# Patient Record
Sex: Female | Born: 1970 | Race: Black or African American | Hispanic: No | Smoking: Never smoker
Health system: Southern US, Community
[De-identification: ages and names within clinical notes are randomized; demographics above are authoritative.]

## PROBLEM LIST (undated history)

## (undated) DIAGNOSIS — G510 Bell's palsy: Secondary | ICD-10-CM

## (undated) DIAGNOSIS — R51 Headache: Secondary | ICD-10-CM

## (undated) DIAGNOSIS — O24419 Gestational diabetes mellitus in pregnancy, unspecified control: Secondary | ICD-10-CM

## (undated) DIAGNOSIS — Z87898 Personal history of other specified conditions: Secondary | ICD-10-CM

## (undated) DIAGNOSIS — K8020 Calculus of gallbladder without cholecystitis without obstruction: Secondary | ICD-10-CM

## (undated) DIAGNOSIS — E119 Type 2 diabetes mellitus without complications: Secondary | ICD-10-CM

## (undated) DIAGNOSIS — R519 Headache, unspecified: Secondary | ICD-10-CM

## (undated) DIAGNOSIS — I480 Paroxysmal atrial fibrillation: Secondary | ICD-10-CM

## (undated) DIAGNOSIS — I10 Essential (primary) hypertension: Secondary | ICD-10-CM

## (undated) DIAGNOSIS — K56609 Unspecified intestinal obstruction, unspecified as to partial versus complete obstruction: Secondary | ICD-10-CM

## (undated) HISTORY — PX: HERNIA REPAIR: SHX51

## (undated) HISTORY — DX: Unspecified intestinal obstruction, unspecified as to partial versus complete obstruction: K56.609

---

## 2002-07-21 ENCOUNTER — Other Ambulatory Visit: Admission: RE | Admit: 2002-07-21 | Discharge: 2002-07-21 | Payer: Self-pay | Admitting: Family Medicine

## 2002-07-21 ENCOUNTER — Other Ambulatory Visit: Admission: RE | Admit: 2002-07-21 | Discharge: 2002-07-21 | Payer: Self-pay | Admitting: Anesthesiology

## 2002-11-24 ENCOUNTER — Ambulatory Visit (HOSPITAL_COMMUNITY): Admission: RE | Admit: 2002-11-24 | Discharge: 2002-11-24 | Payer: Self-pay | Admitting: *Deleted

## 2002-11-28 ENCOUNTER — Inpatient Hospital Stay (HOSPITAL_COMMUNITY): Admission: AD | Admit: 2002-11-28 | Discharge: 2002-11-28 | Payer: Self-pay | Admitting: *Deleted

## 2002-12-11 ENCOUNTER — Ambulatory Visit (HOSPITAL_COMMUNITY): Admission: RE | Admit: 2002-12-11 | Discharge: 2002-12-11 | Payer: Self-pay | Admitting: *Deleted

## 2002-12-27 ENCOUNTER — Encounter: Admission: RE | Admit: 2002-12-27 | Discharge: 2002-12-27 | Payer: Self-pay | Admitting: *Deleted

## 2003-01-24 ENCOUNTER — Encounter: Admission: RE | Admit: 2003-01-24 | Discharge: 2003-01-24 | Payer: Self-pay | Admitting: *Deleted

## 2003-02-07 ENCOUNTER — Encounter: Admission: RE | Admit: 2003-02-07 | Discharge: 2003-02-07 | Payer: Self-pay | Admitting: *Deleted

## 2003-02-07 ENCOUNTER — Ambulatory Visit (HOSPITAL_COMMUNITY): Admission: RE | Admit: 2003-02-07 | Discharge: 2003-02-07 | Payer: Self-pay | Admitting: *Deleted

## 2003-02-20 ENCOUNTER — Encounter: Admission: RE | Admit: 2003-02-20 | Discharge: 2003-05-21 | Payer: Self-pay | Admitting: *Deleted

## 2003-02-21 ENCOUNTER — Encounter: Admission: RE | Admit: 2003-02-21 | Discharge: 2003-02-21 | Payer: Self-pay | Admitting: *Deleted

## 2003-02-28 ENCOUNTER — Ambulatory Visit (HOSPITAL_COMMUNITY): Admission: RE | Admit: 2003-02-28 | Discharge: 2003-02-28 | Payer: Self-pay | Admitting: *Deleted

## 2003-02-28 ENCOUNTER — Encounter: Admission: RE | Admit: 2003-02-28 | Discharge: 2003-02-28 | Payer: Self-pay | Admitting: *Deleted

## 2003-03-07 ENCOUNTER — Encounter: Admission: RE | Admit: 2003-03-07 | Discharge: 2003-03-07 | Payer: Self-pay | Admitting: *Deleted

## 2003-03-14 ENCOUNTER — Encounter: Admission: RE | Admit: 2003-03-14 | Discharge: 2003-03-14 | Payer: Self-pay | Admitting: *Deleted

## 2003-03-21 ENCOUNTER — Encounter: Admission: RE | Admit: 2003-03-21 | Discharge: 2003-03-21 | Payer: Self-pay | Admitting: Obstetrics & Gynecology

## 2003-03-26 ENCOUNTER — Encounter: Admission: RE | Admit: 2003-03-26 | Discharge: 2003-03-26 | Payer: Self-pay | Admitting: *Deleted

## 2003-03-28 ENCOUNTER — Encounter: Admission: RE | Admit: 2003-03-28 | Discharge: 2003-03-28 | Payer: Self-pay | Admitting: *Deleted

## 2003-03-28 ENCOUNTER — Ambulatory Visit (HOSPITAL_COMMUNITY): Admission: RE | Admit: 2003-03-28 | Discharge: 2003-03-28 | Payer: Self-pay | Admitting: *Deleted

## 2003-04-02 ENCOUNTER — Encounter: Admission: RE | Admit: 2003-04-02 | Discharge: 2003-04-02 | Payer: Self-pay | Admitting: *Deleted

## 2003-04-04 ENCOUNTER — Encounter: Admission: RE | Admit: 2003-04-04 | Discharge: 2003-04-04 | Payer: Self-pay | Admitting: *Deleted

## 2003-04-09 ENCOUNTER — Encounter: Admission: RE | Admit: 2003-04-09 | Discharge: 2003-04-09 | Payer: Self-pay | Admitting: *Deleted

## 2003-04-12 ENCOUNTER — Encounter: Admission: RE | Admit: 2003-04-12 | Discharge: 2003-04-12 | Payer: Self-pay | Admitting: *Deleted

## 2003-04-16 ENCOUNTER — Encounter: Admission: RE | Admit: 2003-04-16 | Discharge: 2003-04-16 | Payer: Self-pay | Admitting: *Deleted

## 2003-04-19 ENCOUNTER — Encounter: Admission: RE | Admit: 2003-04-19 | Discharge: 2003-04-19 | Payer: Self-pay | Admitting: *Deleted

## 2003-04-23 ENCOUNTER — Encounter: Admission: RE | Admit: 2003-04-23 | Discharge: 2003-04-23 | Payer: Self-pay | Admitting: *Deleted

## 2003-04-26 ENCOUNTER — Encounter: Admission: RE | Admit: 2003-04-26 | Discharge: 2003-04-26 | Payer: Self-pay | Admitting: Family Medicine

## 2003-04-26 ENCOUNTER — Ambulatory Visit (HOSPITAL_COMMUNITY): Admission: RE | Admit: 2003-04-26 | Discharge: 2003-04-26 | Payer: Self-pay | Admitting: *Deleted

## 2003-04-30 ENCOUNTER — Encounter: Admission: RE | Admit: 2003-04-30 | Discharge: 2003-04-30 | Payer: Self-pay | Admitting: *Deleted

## 2003-05-02 ENCOUNTER — Inpatient Hospital Stay (HOSPITAL_COMMUNITY): Admission: AD | Admit: 2003-05-02 | Discharge: 2003-05-02 | Payer: Self-pay | Admitting: Family Medicine

## 2003-05-03 ENCOUNTER — Encounter: Admission: RE | Admit: 2003-05-03 | Discharge: 2003-05-03 | Payer: Self-pay | Admitting: *Deleted

## 2003-05-04 ENCOUNTER — Inpatient Hospital Stay (HOSPITAL_COMMUNITY): Admission: RE | Admit: 2003-05-04 | Discharge: 2003-05-07 | Payer: Self-pay | Admitting: *Deleted

## 2003-05-04 ENCOUNTER — Encounter (INDEPENDENT_AMBULATORY_CARE_PROVIDER_SITE_OTHER): Payer: Self-pay | Admitting: Specialist

## 2003-05-10 ENCOUNTER — Inpatient Hospital Stay (HOSPITAL_COMMUNITY): Admission: AD | Admit: 2003-05-10 | Discharge: 2003-05-10 | Payer: Self-pay | Admitting: Obstetrics & Gynecology

## 2004-02-26 ENCOUNTER — Inpatient Hospital Stay (HOSPITAL_COMMUNITY): Admission: AD | Admit: 2004-02-26 | Discharge: 2004-02-26 | Payer: Self-pay | Admitting: *Deleted

## 2004-04-29 ENCOUNTER — Inpatient Hospital Stay (HOSPITAL_COMMUNITY): Admission: AD | Admit: 2004-04-29 | Discharge: 2004-04-29 | Payer: Self-pay | Admitting: Family Medicine

## 2004-04-29 ENCOUNTER — Ambulatory Visit: Payer: Self-pay | Admitting: Family Medicine

## 2004-04-30 ENCOUNTER — Ambulatory Visit: Payer: Self-pay | Admitting: *Deleted

## 2004-05-07 ENCOUNTER — Ambulatory Visit: Payer: Self-pay | Admitting: *Deleted

## 2004-05-14 ENCOUNTER — Ambulatory Visit: Payer: Self-pay | Admitting: *Deleted

## 2004-05-21 ENCOUNTER — Ambulatory Visit: Payer: Self-pay | Admitting: *Deleted

## 2004-05-28 ENCOUNTER — Ambulatory Visit: Payer: Self-pay | Admitting: Family Medicine

## 2004-06-04 ENCOUNTER — Ambulatory Visit: Payer: Self-pay | Admitting: Obstetrics & Gynecology

## 2004-06-05 ENCOUNTER — Ambulatory Visit (HOSPITAL_COMMUNITY): Admission: RE | Admit: 2004-06-05 | Discharge: 2004-06-05 | Payer: Self-pay | Admitting: *Deleted

## 2004-06-10 ENCOUNTER — Ambulatory Visit: Payer: Self-pay | Admitting: Obstetrics and Gynecology

## 2004-06-10 ENCOUNTER — Inpatient Hospital Stay (HOSPITAL_COMMUNITY): Admission: AD | Admit: 2004-06-10 | Discharge: 2004-06-12 | Payer: Self-pay | Admitting: *Deleted

## 2004-06-18 ENCOUNTER — Ambulatory Visit: Payer: Self-pay | Admitting: Obstetrics & Gynecology

## 2004-06-20 ENCOUNTER — Ambulatory Visit: Payer: Self-pay | Admitting: *Deleted

## 2004-06-26 ENCOUNTER — Ambulatory Visit: Payer: Self-pay | Admitting: Family Medicine

## 2004-06-30 ENCOUNTER — Ambulatory Visit: Payer: Self-pay | Admitting: Obstetrics & Gynecology

## 2004-07-02 ENCOUNTER — Ambulatory Visit: Payer: Self-pay | Admitting: *Deleted

## 2004-07-02 ENCOUNTER — Ambulatory Visit (HOSPITAL_COMMUNITY): Admission: RE | Admit: 2004-07-02 | Discharge: 2004-07-02 | Payer: Self-pay | Admitting: *Deleted

## 2004-07-07 ENCOUNTER — Ambulatory Visit: Payer: Self-pay | Admitting: Obstetrics & Gynecology

## 2004-07-09 ENCOUNTER — Ambulatory Visit: Payer: Self-pay | Admitting: Obstetrics and Gynecology

## 2004-07-14 ENCOUNTER — Ambulatory Visit: Payer: Self-pay | Admitting: Obstetrics and Gynecology

## 2004-07-16 ENCOUNTER — Ambulatory Visit: Payer: Self-pay | Admitting: Obstetrics & Gynecology

## 2004-07-17 ENCOUNTER — Ambulatory Visit: Payer: Self-pay | Admitting: Obstetrics & Gynecology

## 2004-07-17 ENCOUNTER — Inpatient Hospital Stay (HOSPITAL_COMMUNITY): Admission: RE | Admit: 2004-07-17 | Discharge: 2004-07-20 | Payer: Self-pay | Admitting: Obstetrics & Gynecology

## 2004-07-17 ENCOUNTER — Encounter (INDEPENDENT_AMBULATORY_CARE_PROVIDER_SITE_OTHER): Payer: Self-pay | Admitting: Specialist

## 2004-07-23 ENCOUNTER — Ambulatory Visit: Payer: Self-pay | Admitting: Obstetrics and Gynecology

## 2004-07-23 ENCOUNTER — Inpatient Hospital Stay (HOSPITAL_COMMUNITY): Admission: AD | Admit: 2004-07-23 | Discharge: 2004-07-23 | Payer: Self-pay | Admitting: Obstetrics and Gynecology

## 2004-07-25 ENCOUNTER — Ambulatory Visit: Payer: Self-pay | Admitting: Obstetrics and Gynecology

## 2004-07-25 ENCOUNTER — Inpatient Hospital Stay (HOSPITAL_COMMUNITY): Admission: AD | Admit: 2004-07-25 | Discharge: 2004-07-25 | Payer: Self-pay | Admitting: Obstetrics and Gynecology

## 2004-08-27 ENCOUNTER — Inpatient Hospital Stay (HOSPITAL_COMMUNITY): Admission: AD | Admit: 2004-08-27 | Discharge: 2004-08-27 | Payer: Self-pay | Admitting: Obstetrics & Gynecology

## 2005-02-05 ENCOUNTER — Ambulatory Visit (HOSPITAL_COMMUNITY): Admission: RE | Admit: 2005-02-05 | Discharge: 2005-02-05 | Payer: Self-pay | Admitting: General Surgery

## 2005-02-05 HISTORY — PX: VENTRAL HERNIA REPAIR: SHX424

## 2007-06-30 ENCOUNTER — Ambulatory Visit (HOSPITAL_COMMUNITY): Admission: RE | Admit: 2007-06-30 | Discharge: 2007-06-30 | Payer: Self-pay

## 2007-11-08 ENCOUNTER — Emergency Department (HOSPITAL_COMMUNITY): Admission: EM | Admit: 2007-11-08 | Discharge: 2007-11-08 | Payer: Self-pay | Admitting: Emergency Medicine

## 2008-06-07 ENCOUNTER — Ambulatory Visit: Payer: Self-pay | Admitting: Obstetrics and Gynecology

## 2008-06-08 ENCOUNTER — Encounter: Payer: Self-pay | Admitting: Obstetrics and Gynecology

## 2008-06-08 LAB — CONVERTED CEMR LAB
FSH: 38.4 milliintl units/mL
LH: 24.4 milliintl units/mL
Prolactin: 2 ng/mL
TSH: 1.179 microintl units/mL (ref 0.350–4.500)
hCG, Beta Chain, Quant, S: <2 milliintl units/mL

## 2008-06-14 ENCOUNTER — Ambulatory Visit (HOSPITAL_COMMUNITY): Admission: RE | Admit: 2008-06-14 | Discharge: 2008-06-14 | Payer: Self-pay | Admitting: Obstetrics & Gynecology

## 2009-02-28 ENCOUNTER — Emergency Department (HOSPITAL_COMMUNITY): Admission: EM | Admit: 2009-02-28 | Discharge: 2009-02-28 | Payer: Self-pay | Admitting: Emergency Medicine

## 2009-11-14 ENCOUNTER — Inpatient Hospital Stay (HOSPITAL_COMMUNITY)
Admission: AD | Admit: 2009-11-14 | Discharge: 2009-11-14 | Payer: Self-pay | Source: Home / Self Care | Admitting: Obstetrics and Gynecology

## 2010-01-05 DIAGNOSIS — I480 Paroxysmal atrial fibrillation: Secondary | ICD-10-CM

## 2010-01-05 HISTORY — DX: Paroxysmal atrial fibrillation: I48.0

## 2010-01-26 ENCOUNTER — Encounter: Payer: Self-pay | Admitting: *Deleted

## 2010-03-18 LAB — WET PREP, GENITAL: Yeast Wet Prep HPF POC: NONE SEEN

## 2010-03-26 LAB — DIFFERENTIAL
Basophils Absolute: 0.1 10*3/uL (ref 0.0–0.1)
Basophils Relative: 0 % (ref 0–1)
Eosinophils Absolute: 0.1 10*3/uL (ref 0.0–0.7)
Eosinophils Relative: 1 % (ref 0–5)
Monocytes Absolute: 0.7 10*3/uL (ref 0.1–1.0)
Neutro Abs: 9 10*3/uL — ABNORMAL HIGH (ref 1.7–7.7)

## 2010-03-26 LAB — URINE MICROSCOPIC-ADD ON

## 2010-03-26 LAB — URINALYSIS, ROUTINE W REFLEX MICROSCOPIC
Ketones, ur: NEGATIVE mg/dL
Nitrite: NEGATIVE
Specific Gravity, Urine: 1.022 (ref 1.005–1.030)
pH: 8 (ref 5.0–8.0)

## 2010-03-26 LAB — CBC
HCT: 39.9 % (ref 36.0–46.0)
Hemoglobin: 13 g/dL (ref 12.0–15.0)
MCHC: 32.5 g/dL (ref 30.0–36.0)
MCV: 79.8 fL (ref 78.0–100.0)
RDW: 15.6 % — ABNORMAL HIGH (ref 11.5–15.5)

## 2010-03-26 LAB — BASIC METABOLIC PANEL
CO2: 30 mEq/L (ref 19–32)
Calcium: 8.9 mg/dL (ref 8.4–10.5)
Glucose, Bld: 169 mg/dL — ABNORMAL HIGH (ref 70–99)
Sodium: 135 mEq/L (ref 135–145)

## 2010-03-26 LAB — PREGNANCY, URINE: Preg Test, Ur: NEGATIVE

## 2010-03-26 LAB — HEPATIC FUNCTION PANEL
Bilirubin, Direct: 0.2 mg/dL (ref 0.0–0.3)
Indirect Bilirubin: 0.2 mg/dL — ABNORMAL LOW (ref 0.3–0.9)

## 2010-05-20 NOTE — Group Therapy Note (Signed)
NAME:  [name], [name] NO.:  [id number]   MEDICAL RECORD NO.:  [id number]          PATIENT TYPE:  WOC   LOCATION:  [facility]                   FACILITY:  [facility]   PHYSICIAN:  Argentina Donovan, MD        DATE OF BIRTH:  1970-12-17   DATE OF SERVICE:  06/07/2008                                  CLINIC NOTE   The patient is a 40 year old African female from [place], gravida 4,  para 4-0-0-4 who has 2 of her children here, youngest 38 years old.  Her  other 2 are in [place] still.  She had her last Pap smear just recently  [facility] and they referred her here because of  difficulty seeing the cervix evaluating for the IUD that she has had in  since shortly after the birth of her last baby and they thought she felt  a pelvic mass.  Interestingly enough, this patient has been amenorrheic  since the IUD was placed and she has a Tax adviser.   PHYSICAL EXAMINATION:  ABDOMEN:  On examination, the abdomen soft, flat,  nontender.  No masses, no organomegaly.  GENITALIA:  External genitalia is normal.  BUS within normal limits.  Vagina is clean and well rugated.  The cervix I could not visualize  using even the very largest speculum that we have because of redundant  tissue and the acute anterior position of the cervix.  On bimanual  pelvic examination, it was difficult even to feel the cervix.  It felt  like the uterus was enlarged with anterior fibroid posting on the  bladder or a mass behind the uterus.   In any case, I am going to follow this up with an ultrasound to just  evaluate the uterus and make sure the IUD is still in place.  In  addition to this, I want to run some lab tests to see if there is any  explanation for her amenorrhea.  The patient denies hot flashes or any  other signs of menopause or ovarian insufficiency.   DIAGNOSES:  1. Pelvic mass.  2. Long-term amenorrhea with a ParaGard intrauterine device.  3. Cervix was unable to visualize and  difficult to palpate.           ______________________________  Argentina Donovan, MD     PR/MEDQ  D:  06/07/2008  T:  06/07/2008  Job:  [id number]

## 2010-05-23 NOTE — Discharge Summary (Signed)
NAME:  [name], [name] NO.:  [id number]   MEDICAL RECORD NO.:  [id number]                   PATIENT TYPE:  INP   LOCATION:  9111                                 [facility]   PHYSICIAN:  Ursula Beath, MD               DATE OF BIRTH:  04-19-1970   DATE OF ADMISSION:  05/04/2003  DATE OF DISCHARGE:  05/07/2003                                 DISCHARGE SUMMARY   DISCHARGE DIAGNOSES:  1. Term pregnancy, delivered.  2. Low transverse cesarean section.   DISCHARGE MEDICATIONS:  1. Ibuprofen 600 mg one p.o. q.6h. p.r.n. pain.  2. Percocet 325 one p.o. q.4-6h. p.r.n. severe pain.  3. Micronor one p.o. daily.  4. Colace two tablets p.o. b.i.d. p.r.n. constipation.  5. Prenatal vitamins one p.o. daily.  6. Iron sulfate 325 mg one p.o. t.i.d.   HOSPITAL COURSE:  This is a 40 year old G6, P4-0-1-2 who was admitted to the  hospital for a low transverse cesarean section for a large for gestational  age fetus and a history of prior  neonatal death secondary to birth trauma.  The patient underwent a low transverse cesarean section without  complications.  She did, however, develop a postoperative fever and was give  triple antibiotic therapy with ampicillin, gentamicin and clindamycin.  She  became afebrile and was discharged with no antibiotics.  Estimated blood  loss from her cesarean section was 500 to 1000 mL.  The patient also had a  gestation complicated by diabetes mellitus.  She delivered a female infant  with Apgars of 8 at one minute and 9 at five minutes.  The baby's weight was  10 pounds 2 ounces, length was 22 inches.   LABORATORY DATA AT DISCHARGE:  Hemoglobin 6.6, hematocrit 21.3.   The patient was discharged to home in improved condition and will follow up  in three days at the [facility]U to have her staples removed and will also follow up  at the [facility] in six weeks.                                               Ursula Beath, MD    JT/MEDQ  D:  05/07/2003  T:  05/08/2003  Job:  [id number]

## 2010-05-23 NOTE — Op Note (Signed)
[name], [name]:  [id number]   MEDICAL RECORD NO.:  [id number]          PATIENT TYPE:  WOC   LOCATION:  WOC                          FACILITY:  [facility]   PHYSICIAN:  Lesly Dukes, M.D. DATE OF BIRTH:  10/13/1970   DATE OF PROCEDURE:  07/17/2004  DATE OF DISCHARGE:                                 OPERATIVE REPORT   PREOPERATIVE DIAGNOSIS:  A 40 year old G7, para 5-0-0-1-5, at 82 weeks  estimated gestational age for repeat cesarean section, gestational diabetes  and presumed macrosomia.   POSTOPERATIVE DIAGNOSIS:  A 40 year old G7, para 5-0-0-1-5, at 59 weeks  estimated gestational age for repeat cesarean section, gestational diabetes  and presumed macrosomia.   OPERATION/PROCEDURE:  Repeat low flap transverse cesarean section.   SURGEON:  Lesly Dukes, M.D.   ASSISTANT:  Marc Morgans. Mayford Knife, M.D.   ANESTHESIA:  Spinal.   SPECIMENS:  Placenta.   ESTIMATED BLOOD LOSS:  900 mL.   COMPLICATIONS:  None.   FINDINGS:  A viable infant, weighing 11 pounds 2 ounces, vertex, clear  fluid.  Nuchal cord x1.  Normal uterus, ovaries and fallopian tubes.  Apgars  9 at one minute and 9 at five minutes.   DESCRIPTION OF PROCEDURE:  After informed consent was obtained, the patient  was taken to the operating room where spinal anesthesia was found to be  adequate.  The patient was placed in the dorsal lithotomy position with a  leftward tilt and prepared and draped in the normal sterile fashion.  The  Foley was in the bladder.   A Pfannenstiel skin incision was made with the scalpel and carried down to  the underlying fascia.  The fascia was incised in the midline and this  incision was extended bilaterally with the Mayo scissors.  The superior and  inferior aspects of the fascial incision were grasped with Kocher clamps,  tented up, dissected off sharply and bluntly from underlying layers of  rectus muscles.  The rectus muscles were separated in the  midline.  The  peritoneum was identified, tented up and entered sharply with the Metzenbaum  scissors. This incision was extended both superior and inferiorly with good  visualization of the bladder.  The bladder blade was inserted.  The  vesicouterine peritoneum was identified, tented up and entered sharply with  the Metzenbaum scissors.  This incision was extended bilaterally and the  bladder flap was created digitally.  Uterine incision was made in the  transverse fashion in the lower uterine segments.  This incision was  extended bilaterally with the bandage scissors.  The baby's head was  delivered atraumatically.  The nose and mouth were suctioned.  The nuchal  cord was reduced. The rest of the baby's body delivered easily.  The cord  was clamped and cut and the baby was handed off to the waiting  pediatricians.  Cord blood was sent for type and screen and a umbilical cord  arterial blood gas was also sent.  Placenta delivered spontaneously.  The  uterus was cleared of all clots and debris.   The uterine incision was closed in  multiple layers, the first layer with  Vicryl in a running locked fashion.  A second suture of 0 Monocryl.  Good  hemostasis was noted.  The intraperitoneal cavity was copiously irrigated  and the uterus was noted to be hemostatic on tension.  The rectus muscles  and peritoneum were  hemostatic.  The fascia was closed with 0 Vicryl in a  running fashion.  Good hemostasis was noted.  The subcutaneous tissue was  irrigated and found to be hemostatic.  The skin was closed with staples.  The patient tolerated the procedure well.  Sponge, lab, instrument, and  needle counts were x2.  The patient went to the recovery room in stable  condition.       KHL/MEDQ  D:  07/17/2004  T:  07/17/2004  Job:  [id number]

## 2010-05-23 NOTE — Op Note (Signed)
NAMETKEYAH, BURKMAN                        ACCOUNT NO.:  [id number]   MEDICAL RECORD NO.:  [id number]                   PATIENT TYPE:  INP   LOCATION:  9111                                 [facility]   PHYSICIAN:  Conni Elliot, M.D.             DATE OF BIRTH:  06-26-1970   DATE OF PROCEDURE:  05/04/2003  DATE OF DISCHARGE:  05/07/2003                                 OPERATIVE REPORT   PREOPERATIVE DIAGNOSES:  1. History of neonatal demise.  2. Large for gestational age infant.   POSTOPERATIVE DIAGNOSES:  1. History of neonatal demise.  2. Large for gestational age infant.   OPERATION:  Low transverse cesarean section.   FINDINGS:  Female infant with Apgars of 8 and 9.  Cord pH 7.23.  Placenta  was sent to pathology.   DESCRIPTION OF PROCEDURE:  After placing the patient under regional  anesthetic, the patient in supine with left lateral tilt position, abdomen  is prepped and draped in sterile fashion.  A Pfannenstiel incision is made.  Incision made in skin to fascia.  Rectus muscles separated.  Peritoneum  entered.  Bladder flap created.  Low transverse uterine incision is made.  Baby is delivered in vertex presentation.  Cord doubly clamped and cut.  Placenta delivered spontaneously.  Uterus closed in routine fashion.  Anterior peritoneal fascia and subcutaneous skin closed in routine fashion.  Estimated blood loss approximately 800 to 1000 mL.  Sponge, needle and  instrument counts were correct.                                               Conni Elliot, M.D.    ASG/MEDQ  D:  07/11/2003  T:  07/11/2003  Job:  [id number]

## 2010-05-23 NOTE — Op Note (Signed)
[name], [name]              ACCOUNT NO.:  [id number]   MEDICAL RECORD NO.:  [id number]          PATIENT TYPE:  OIB   LOCATION:  2550                         FACILITY:  [facility]   PHYSICIAN:  Leonie Man, M.D.   DATE OF BIRTH:  1970-02-04   DATE OF PROCEDURE:  02/05/2005  DATE OF DISCHARGE:                                 OPERATIVE REPORT   PREOPERATIVE DIAGNOSIS:  Ventral hernia   POSTOPERATIVE DIAGNOSIS:  Ventral hernia.   PROCEDURE:  Repair of ventral hernia with mesh (Kugel dual mesh).   SURGEON:  Leonie Man, M.D.   ASSISTANT:  OR tech.   ANESTHESIA:  General.   SPECIMENS:  No specimens to the lab.   ESTIMATED BLOOD LOSS:  Minimal.   COMPLICATIONS:  There were no complications.   CONDITION:  The patient returned to the PACU in good condition.   HISTORY:  The patient is a 40 year old female who is status post two  cesarean sections. She now has a lower midline hernia as well as an  umbilical hernia; and significant laxity of her abdominal wall. She comes to  the operating room for repair of her hernias after the risks and potential  benefits of surgery have been fully discussed. All questions answered; and  consent and consent obtained.   PROCEDURE:  Following the induction of satisfactory general anesthesia, a  Foley catheter was placed in the bladder. The abdomen is prepped and draped  to be included in a sterile operative field. Laparotomy is created through a  midline incision deepened through skin, subcutaneous tissues, and carried  down to the hernia sac which is dissected free; and the abdomen is entered.  The edges of the sac were cleared from all omental adhesions, allowing a  clear access to the sac.  I used a 17  x 12 cm Kugel patch, inserted it into  the abdomen and deployed it to cover both the umbilical hernia and the lower  abdominal wall hernia. This was sewn in with interrupted sutures of #1  Novofil and then the additional tacked to the  abdominal wall using an  Endotacker. The repair is noted to be intact. Sponge and instrument counts  were verified. The fascia is closed over the patch with interrupted #1  Novofil sutures.  Subcutaneous tissues closed with a running 2-0 Vicryl suture; and the skin  closed with a 4-0 Monocryl suture. Wounds were then reinforced with Steri-  Strips. Sterile dressings applied. The anesthetic reversed; and the patient  removed from the operating room to the recovery room in stable condition.  She tolerated the procedure well.      Leonie Man, M.D.  Electronically Signed     PB/MEDQ  D:  02/05/2005  T:  02/05/2005  Job:  [id number]

## 2010-05-23 NOTE — Discharge Summary (Signed)
[name], [name]              ACCOUNT NO.:  [id number]   MEDICAL RECORD NO.:  [id number]          PATIENT TYPE:  INP   LOCATION:  9158                          [facility]   PHYSICIAN:  Conni Elliot, M.D.DATE OF BIRTH:  [birth year]   DATE OF ADMISSION:  06/10/2004  DATE OF DISCHARGE:  06/12/2004                                 DISCHARGE SUMMARY   ADMITTING DIAGNOSES:  1.  [age]-year-old gravida 7, para 5-0-1-3, at 33 weeks 5 days.  2.  Preterm uterine contractions.  3.  Dehydration.  4.  A-2 diabetes mellitus.  5.  Large-for-gestational-age fetus, estimated fetal weight greater than      95%.  6.  Marginal previa.   DISCHARGE DIAGNOSES:  1.  [age]-year-old gravida 7, para 5-0-1-3, at 33 weeks 6 days.  2.  A-2 diabetes mellitus.  3.  Large-for-gestational-age fetus, estimated fetal weight greater than      95%.  4.  Marginal previa, resolved per ultrasound, June 10, 2004.  5.  Preterm uterine contractions, resolved.   DISCHARGE MEDICATIONS:  The patient will be discharged with the following  medications:  1.  Procardia XL 30 mg p.o. b.i.d.  2.  Prenatal vitamins -- one tablet p.o. daily.  3.  NPH 15 units and Regular 7 units q.a.m.; Regular 5 units before dinner;      NPH 18 units nightly.   ADMISSION HISTORY:  [name] is a 40 year old G7, P5-0-1-3 female that  presented to the MAU at 33 weeks 5 days complaining of low back pain and  abdominal pain for 1 day.  The patient complained of contractions, but  denied rupture of membranes or vaginal bleeding, reported good fetal  movement.  The patient also complained of being weak and dizzy for 24 hours  without appetite, had not eaten for 24 hours.  The patient is currently  being seen at [facility] for risk factor of A-2 gestational diabetes,  marginal previa, large-for-gestational-age fetus and history of neonatal  death in [place].   At admission, vital signs were within normal limits.  On  physical exam, the  patient looked uncomfortable with uterine contractions.  Digital cervical  exam was deferred.  Fetal heart tones were in the 120s to 125 range,  moderate variability, no decelerations, with a reactive strip.  The patient  was contracting 6-8 minutes apart, lasting 60-100 seconds.   ADMISSION LABORATORIES:  CBG 88.  Specific gravity 1.025, greater than 80  ketones.  Hemoglobin 10.2, WBC 11.1, platelets 207,000, MCV 71.6.  BUN 4,  creatinine 0.6, sodium 136, potassium 3.5, chloride 99, CO2 24, glucose 76,  calcium 9.2.  Group B strep negative.   HOSPITAL ADMISSION COURSE:  The patient was admitted to the antenatal unit  for preterm uterine contractions.  IV was started to begin rehydration  secondary to dehydration as assessed by urinary analysis with greater than  80 ketones and specific gravity.  The patient  was also started on Procardia  XL 30 mg b.i.d.  By day 2 of hospital admission, the patient's uterine  contractions had decreased to 1-2 contractions per hour.  By day of  discharge, the patient had not had any contractions the night before  discharge and only 1 uterine contraction per monitor the morning of  discharge date.  The patient's vital signs remained stable, no reported  leakage of fluid, no vaginal bleeding, baby continued to move well as  reported by patient.  The day of discharge, the patient was taken off IV  fluids and monitored for return of contractions; however, the patient  continued to hydrate herself orally and no contractions were noted per  monitor.   The patient was kept on home insulin regimen throughout the hospital course.  Fasting CBGs remained at goal throughout hospital admission; however, the  patient did have some elevated postprandial CBGs with a max of 164, two  hours postprandially.   ULTRASOUNDS:  Ultrasound on June 10, 2004:  One fetus, cephalic positioning,  placenta posterior, no previa, AFI 12.6, cervix 3.5 cm.    FOLLOWUP:  The patient will follow up at [facility] on Wednesday, June 18, 2004.   DISCHARGE INSTRUCTIONS:  Increase oral fluid intake to prevent recurrent  dehydration.  The patient was given preterm labor precautions at discharge  by nursing staff.       VRE/MEDQ  D:  06/12/2004  T:  06/12/2004  Job:  [id number]

## 2010-05-23 NOTE — Discharge Summary (Signed)
[name], [name] NO.:  [id number]   MEDICAL RECORD NO.:  [id number]          PATIENT TYPE:  WOC   LOCATION:  WOC                          FACILITY:  [facility]   PHYSICIAN:  Sarah Gins, MD     DATE OF BIRTH:  Jul 30, 1970   DATE OF ADMISSION:  07/16/2004  DATE OF DISCHARGE:  07/20/2004                                 DISCHARGE SUMMARY   DISCHARGE DIAGNOSES:  1.  Term pregnancy, delivered.  2.  Gestational diabetes type A2.   PROCEDURE:  Repeat low transverse cesarean section.   DISCHARGE MEDICATIONS:  1.  Ibuprofen 600 mg on q.6h p.r.n. pain.  2.  Prenatal vitamins one p.o. daily x6 weeks or until stopped breast      feeding.  3.  Birth control - Micronor one p.o. daily.   DISCHARGE INSTRUCTIONS:   FOLLOW UP:  Return to MAU Wednesday for staple removal. Follow up at [facility] in 4 weeks for routine postpartum visit.   BRIEF HISTORY AND HOSPITAL COURSE:  [name] is 40 year old gravida 7, para  5, 0, 0, 1, 5, admitted at [redacted] weeks gestation for a scheduled repeat low  transverse cesarean section. She was admitted and delivered a viable female  infant on July 17, 2004. She underwent routine postpartum care, was breast  feeding. Rubella immune. Contraception pills now and IUD planned in the  future. Routine wound care with staple removal planned for postoperative day  number 5.   The patient developed a fever to 102 with fundus tender. Received  ampicillin, gentamicin and clindamycin x2 days; was afebrile for greater  than 36 hours, and antibiotics discontinued on July 20, 2004 prior to  discharge. She was mildly tender at discharge, mostly in her abdominal wall.   She had right lower extremity edema and a right lower extremity Doppler was  negative for deep venous thrombosis.   Gestational diabetes mellitus type A2.  This is possibly turning to type 2  diabetes. She has never been on medication or diagnosed as an outpatient  when not pregnant but  agreed to follow up with per primary care physician,  Dr. Lorelle [name], for further work up and medical treatment as  needed.   She was discharged on postoperative day number 3 in good condition without  complaint.       JS/MEDQ  D:  07/20/2004  T:  07/20/2004  Job:  [id number]   cc:   [name], M.D.  [phone] [address]  [place]  [place] [address]  Fax: [phone]   [facility]

## 2010-08-29 ENCOUNTER — Inpatient Hospital Stay (INDEPENDENT_AMBULATORY_CARE_PROVIDER_SITE_OTHER)
Admission: RE | Admit: 2010-08-29 | Discharge: 2010-08-29 | Disposition: A | Discharge status: Home / Self Care | Payer: 59 | Source: Ambulatory Visit | Attending: Emergency Medicine | Admitting: Emergency Medicine

## 2010-08-29 DIAGNOSIS — K047 Periapical abscess without sinus: Secondary | ICD-10-CM

## 2010-08-29 DIAGNOSIS — E119 Type 2 diabetes mellitus without complications: Secondary | ICD-10-CM

## 2010-08-29 LAB — POCT URINALYSIS DIP (DEVICE)
Glucose, UA: 250 mg/dL — AB
Nitrite: NEGATIVE
Protein, ur: 30 mg/dL — AB
Urobilinogen, UA: 0.2 mg/dL (ref 0.0–1.0)

## 2010-08-29 LAB — POCT PREGNANCY, URINE: Preg Test, Ur: NEGATIVE

## 2010-08-29 LAB — POCT I-STAT, CHEM 8
Calcium, Ion: 1.22 mmol/L (ref 1.12–1.32)
Creatinine, Ser: 0.8 mg/dL (ref 0.50–1.10)
Hemoglobin: 15.3 g/dL — ABNORMAL HIGH (ref 12.0–15.0)
Sodium: 136 mEq/L (ref 135–145)
TCO2: 25 mmol/L (ref 0–100)

## 2010-09-06 DIAGNOSIS — K56609 Unspecified intestinal obstruction, unspecified as to partial versus complete obstruction: Secondary | ICD-10-CM

## 2010-09-06 HISTORY — DX: Unspecified intestinal obstruction, unspecified as to partial versus complete obstruction: K56.609

## 2010-09-17 ENCOUNTER — Emergency Department (HOSPITAL_COMMUNITY): Payer: 59

## 2010-09-17 ENCOUNTER — Inpatient Hospital Stay (HOSPITAL_COMMUNITY)
Admission: EM | Admit: 2010-09-17 | Discharge: 2010-09-20 | DRG: 390 | Disposition: A | Discharge status: Home / Self Care | Payer: 59 | Attending: Internal Medicine | Admitting: Internal Medicine

## 2010-09-17 DIAGNOSIS — K56609 Unspecified intestinal obstruction, unspecified as to partial versus complete obstruction: Secondary | ICD-10-CM

## 2010-09-17 DIAGNOSIS — R109 Unspecified abdominal pain: Secondary | ICD-10-CM

## 2010-09-17 DIAGNOSIS — Z8632 Personal history of gestational diabetes: Secondary | ICD-10-CM

## 2010-09-17 DIAGNOSIS — IMO0001 Reserved for inherently not codable concepts without codable children: Secondary | ICD-10-CM | POA: Diagnosis present

## 2010-09-17 DIAGNOSIS — Z794 Long term (current) use of insulin: Secondary | ICD-10-CM

## 2010-09-17 DIAGNOSIS — Z8719 Personal history of other diseases of the digestive system: Secondary | ICD-10-CM

## 2010-09-17 LAB — URINALYSIS, ROUTINE W REFLEX MICROSCOPIC
Ketones, ur: 40 mg/dL — AB
Protein, ur: NEGATIVE mg/dL
Urobilinogen, UA: 0.2 mg/dL (ref 0.0–1.0)

## 2010-09-17 LAB — POCT I-STAT, CHEM 8
BUN: 13 mg/dL (ref 6–23)
Chloride: 101 mEq/L (ref 96–112)
Creatinine, Ser: 1 mg/dL (ref 0.50–1.10)
Sodium: 136 mEq/L (ref 135–145)
TCO2: 23 mmol/L (ref 0–100)

## 2010-09-17 LAB — DIFFERENTIAL
Eosinophils Absolute: 1 10*3/uL — ABNORMAL HIGH (ref 0.0–0.7)
Lymphocytes Relative: 33 % (ref 12–46)
Lymphs Abs: 3.8 10*3/uL (ref 0.7–4.0)
Monocytes Relative: 6 % (ref 3–12)
Neutro Abs: 6 10*3/uL (ref 1.7–7.7)
Neutrophils Relative %: 52 % (ref 43–77)

## 2010-09-17 LAB — GLUCOSE, CAPILLARY
Glucose-Capillary: 148 mg/dL — ABNORMAL HIGH (ref 70–99)
Glucose-Capillary: 151 mg/dL — ABNORMAL HIGH (ref 70–99)
Glucose-Capillary: 153 mg/dL — ABNORMAL HIGH (ref 70–99)
Glucose-Capillary: 187 mg/dL — ABNORMAL HIGH (ref 70–99)
Glucose-Capillary: 268 mg/dL — ABNORMAL HIGH (ref 70–99)
Glucose-Capillary: 283 mg/dL — ABNORMAL HIGH (ref 70–99)

## 2010-09-17 LAB — LIPID PANEL
Cholesterol: 106 mg/dL (ref 0–200)
HDL: 49 mg/dL (ref >39)
Total CHOL/HDL Ratio: 2.2 RATIO
VLDL: 18 mg/dL (ref 0–40)

## 2010-09-17 LAB — COMPREHENSIVE METABOLIC PANEL
Albumin: 3.4 g/dL — ABNORMAL LOW (ref 3.5–5.2)
Alkaline Phosphatase: 113 U/L (ref 39–117)
BUN: 7 mg/dL (ref 6–23)
Calcium: 8.9 mg/dL (ref 8.4–10.5)
Creatinine, Ser: 0.62 mg/dL (ref 0.50–1.10)
GFR calc Af Amer: >60 mL/min (ref >60)
Glucose, Bld: 204 mg/dL — ABNORMAL HIGH (ref 70–99)
Potassium: 3.1 mEq/L — ABNORMAL LOW (ref 3.5–5.1)
Total Protein: 7.4 g/dL (ref 6.0–8.3)

## 2010-09-17 LAB — CBC
HCT: 37.4 % (ref 36.0–46.0)
Hemoglobin: 12.9 g/dL (ref 12.0–15.0)
Hemoglobin: 12.3 g/dL (ref 12.0–15.0)
MCH: 26 pg (ref 26.0–34.0)
MCH: 25.6 pg — ABNORMAL LOW (ref 26.0–34.0)
MCV: 75.4 fL — ABNORMAL LOW (ref 78.0–100.0)
Platelets: 269 10*3/uL (ref 150–400)
RBC: 4.96 MIL/uL (ref 3.87–5.11)
RBC: 4.8 MIL/uL (ref 3.87–5.11)
WBC: 11.5 10*3/uL — ABNORMAL HIGH (ref 4.0–10.5)
WBC: 10.8 10*3/uL — ABNORMAL HIGH (ref 4.0–10.5)

## 2010-09-17 LAB — URINE MICROSCOPIC-ADD ON

## 2010-09-17 LAB — LACTIC ACID, PLASMA: Lactic Acid, Venous: 1.6 mmol/L (ref 0.5–2.2)

## 2010-09-17 LAB — TSH: TSH: 1.409 u[IU]/mL (ref 0.350–4.500)

## 2010-09-17 MED ORDER — IOHEXOL 300 MG/ML  SOLN
100.0000 mL | Freq: Once | INTRAMUSCULAR | Status: AC | PRN
  Administered 2010-09-17: 100 mL via INTRAVENOUS

## 2010-09-18 ENCOUNTER — Inpatient Hospital Stay (HOSPITAL_COMMUNITY): Payer: 59

## 2010-09-18 LAB — GLUCOSE, CAPILLARY
Glucose-Capillary: 290 mg/dL — ABNORMAL HIGH (ref 70–99)
Glucose-Capillary: 218 mg/dL — ABNORMAL HIGH (ref 70–99)
Glucose-Capillary: 258 mg/dL — ABNORMAL HIGH (ref 70–99)
Glucose-Capillary: 236 mg/dL — ABNORMAL HIGH (ref 70–99)

## 2010-09-18 LAB — CBC
Hemoglobin: 11.2 g/dL — ABNORMAL LOW (ref 12.0–15.0)
MCH: 25.2 pg — ABNORMAL LOW (ref 26.0–34.0)
Platelets: 217 10*3/uL (ref 150–400)
RBC: 4.44 MIL/uL (ref 3.87–5.11)
WBC: 7.9 10*3/uL (ref 4.0–10.5)

## 2010-09-18 LAB — HEMOGLOBIN A1C
Hgb A1c MFr Bld: 15.4 % — ABNORMAL HIGH (ref <5.7)
Mean Plasma Glucose: 395 mg/dL — ABNORMAL HIGH (ref <117)

## 2010-09-18 LAB — BASIC METABOLIC PANEL
CO2: 30 mEq/L (ref 19–32)
Calcium: 8.7 mg/dL (ref 8.4–10.5)
Glucose, Bld: 253 mg/dL — ABNORMAL HIGH (ref 70–99)
Potassium: 3 mEq/L — ABNORMAL LOW (ref 3.5–5.1)
Sodium: 136 mEq/L (ref 135–145)

## 2010-09-19 ENCOUNTER — Inpatient Hospital Stay (HOSPITAL_COMMUNITY): Payer: 59

## 2010-09-19 LAB — CBC
MCH: 25.3 pg — ABNORMAL LOW (ref 26.0–34.0)
MCHC: 32.8 g/dL (ref 30.0–36.0)
Platelets: 215 10*3/uL (ref 150–400)
RDW: 14.3 % (ref 11.5–15.5)

## 2010-09-19 LAB — PROTIME-INR: Prothrombin Time: 14.2 seconds (ref 11.6–15.2)

## 2010-09-19 LAB — BASIC METABOLIC PANEL
BUN: <3 mg/dL — ABNORMAL LOW (ref 6–23)
Calcium: 8.8 mg/dL (ref 8.4–10.5)
Creatinine, Ser: 0.63 mg/dL (ref 0.50–1.10)
GFR calc Af Amer: >60 mL/min (ref >60)
GFR calc non Af Amer: >60 mL/min (ref >60)

## 2010-09-19 LAB — GLUCOSE, CAPILLARY: Glucose-Capillary: 216 mg/dL — ABNORMAL HIGH (ref 70–99)

## 2010-09-20 LAB — BASIC METABOLIC PANEL
BUN: <3 mg/dL — ABNORMAL LOW (ref 6–23)
GFR calc Af Amer: >60 mL/min (ref >60)
GFR calc non Af Amer: >60 mL/min (ref >60)
Potassium: 2.9 mEq/L — ABNORMAL LOW (ref 3.5–5.1)
Sodium: 142 mEq/L (ref 135–145)

## 2010-09-20 LAB — GLUCOSE, CAPILLARY: Glucose-Capillary: 75 mg/dL (ref 70–99)

## 2010-09-21 NOTE — Discharge Summary (Signed)
[name], [name] NO.:  [id number]  MEDICAL RECORD NO.:  [id number]  LOCATION:  5125                         FACILITY:  [facility]  PHYSICIAN:  Jeoffrey Massed, MD    DATE OF BIRTH:  Oct 28, 1970  DATE OF ADMISSION:  09/17/2010 DATE OF DISCHARGE:  09/20/2010                        DISCHARGE SUMMARY - REFERRING   PRIMARY CARE PRACTITIONER:  Fleet Contras, MD, at the [facility].  PRIMARY DISCHARGE DIAGNOSES: 1. Small bowel obstruction. 2. Uncontrolled hyperglycemia.  PAST MEDICAL HISTORY/SECONDARY DISCHARGE DIAGNOSES: 1. Recent diagnosis of diabetes mellitus type 2. 2. History of gestational diabetes.  DISCHARGE MEDICATIONS: 1. Lantus 22 units subcutaneously at bedtime. 2. Metformin 500 mg p.o. twice daily.  CONSULTANTS ON THE CASE:  Lorne Skeens. Hoxworth, MD, from [facility].  BRIEF HISTORY OF PRESENT ILLNESS:  The patient is a 40 year old African American female who was recently diagnosed with diabetes who presented to the ED on September 17, 2010, complaining of abdominal pain.  She was also complaining of polyuria and polydipsia for a number of weeks.  She was found to have extremely elevated blood sugar levels and also was found to have a possible small bowel obstruction and admitted to the Hospitalist Service for further evaluation.  For further details, please see the history and physical that was dictated by me on admission.  PERTINENT LABORATORY DATA: 1. HbA1c is 15.4. 2. TSH is 1.409. 3. LDL cholesterol is 39.  PERTINENT RADIOLOGICAL STUDIES: 1. CT of the abdomen and pelvis done on September 17, 2010, showed     diffuse distention of the small bowel loops to 3.8 cm in maximal     diameter, with equalization of a long segment of mid ileum.  This     extends to the level of a transition point at the midportion of the     patient's anterior abdominal wall mass.  This reflects either     obstruction or focal loss of small  bowel motility secondary to     overlying mesh, either due to right-sided invagination of the mass     or an adjacent adhesion. 2. Abdominal x-ray done on September 19, 2010, showed resolved small     bowel dilatation.  BRIEF HOSPITAL COURSE: 1. Small bowel obstruction.  As noted above, the patient presented     with abdominal pain.  Her radiological studies were consistent with     a small bowel obstruction.  The patient was admitted, kept n.p.o.,     and given supportive care.  [facility] was consulted.     They were contemplating surgery.  However, then, the patient got     significantly better, had a couple of bowel movements and her small     bowel obstruction resolved clinically.  This was also followed by     radiological resolution as noted above.  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  It is currently planned for the patient to     follow up with Dr. Johna Sheriff as an outpatient for surgical     intervention.  Dr. Johna Sheriff has  cleared this patient for discharge     today. 2. Uncontrolled hyperglycemia.  This is secondary to new onset     diabetes.  The patient was symptomatic with polyuria and     polydipsia.  The patient was started on insulin glucose stabilizer     protocol initially and then transitioned to Lantus.  She is going     to be discharged on Lantus and metformin as noted above.  Her HbA1c     is significantly elevated and will need close monitoring by her     primary care practitioner.  She has been provided with diabetic     teaching, diabetic education, and nutritional evaluation as well.     Her sugars are significantly better and stable to be discharged     home.  DISPOSITION:  The patient is to be discharged home today.  FOLLOWUP INSTRUCTIONS: 1. The patient is to follow up with Dr. Johna Sheriff in the next couple of     weeks to schedule for surgery. 2. The  patient is to follow up with her primary care practitioner, Dr.     Concepcion Elk in the next 1-2 weeks.  She is to call and make an     appointment.  She has Dr. Albertina Parr number.  Total time spent for discharge equals 45 minutes.     Jeoffrey Massed, MD     SG/MEDQ  D:  09/20/2010  T:  09/20/2010  Job:  [id number]  cc:   Lorne Skeens. Hoxworth, M.D. Fleet Contras, M.D.  Electronically Signed by Jeoffrey Massed  on 09/21/2010 12:27:19 PM

## 2010-09-21 NOTE — H&P (Signed)
[name], [name] NO.:  [id number]  MEDICAL RECORD NO.:  [id number]  LOCATION:  [facility]                         FACILITY:  [facility]  PHYSICIAN:  Jeoffrey Massed, MD    DATE OF BIRTH:  Jul 30, 1970  DATE OF ADMISSION:  09/17/2010 DATE OF DISCHARGE:                             HISTORY & PHYSICAL   PRIMARY CARE PRACTITIONER:  None.  CHIEF COMPLAINT:  Abdominal pain since yesterday 9:00 p.m.  HISTORY OF PRESENT ILLNESS:  The patient is a 39 year old African female with no significant past medical history with the exception of newly diagnosed diabetes, just started on oral medications, the name of which she cannot recall couple of weeks ago, comes into the ED with the above noted complaints.  Per the patient, she was in usual state of health until yesterday evening when she started noticing abdominal pain, which is mostly in the periumbilical area.  The patient describes the pain as colic in nature and the pain 10/10 at its worst.  There has been associated nausea but no vomiting.  The patient's last bowel movement was 4 days ago.  The patient denies any fever or chills.  Per the patient, her regular bowel movement occurs only every other day or so. Per patient for a couple of months, she has been feeling very weak, tired and has been having polyuria and polydipsia.  Per patient because of these reasons, she went to the Urgent Care a couple of weeks ago and was told that she had diabetes and was given oral medication.  Per the patient during her last pregnancy around 6 years ago, she apparently did have gestational diabetes.  ALLERGIES:  None.  PAST MEDICAL HISTORY: 1. History of gestational diabetes. 2. Recently diagnosed with type 2 diabetes.  PAST SURGICAL HISTORY: 1. Two C-sections. 2. Repair of ventral hernia in 2007.  MEDICATIONS AT HOME:  The patient apparently is on oral hypoglycemic agent, the name of which she cannot recollect.  FAMILY HISTORY:   Noncontributory.  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  REVIEW OF SYSTEMS:  A detailed review of 12 systems was done and these are negative except for one is noted in the HPI.  PHYSICAL EXAMINATION:  GENERAL:  Lying in bed, does not appear to be in any distress, awake, alert.  Speech clear. VITAL SIGNS:  Initial vital signs showed a temperature of 98.2, heart rate of 83, blood pressure 157/121, respiration of 16 and pulse ox of 100% on room air. HEENT:  Atraumatic, normocephalic.  Pupils are equally react to light and accommodation. NECK:  Supple.  Oral mucosa is moist. CHEST:  Bilaterally clear to auscultation. CARDIOVASCULAR:  Heart sounds are regular.  No murmurs heard. ABDOMEN:  Bowel sounds are very, very sluggish, there is mild periumbilical tenderness without any rebound or rigidity.  The abdomen is soft. EXTREMITIES:  No edema. NEUROLOGIC:  The patient is awake and alert and there are no focal neurological deficits.  LABORATORY DATA: 1. Urine for pregnancy test is negative. 2. I-STAT chemistry shows sodium of 136, potassium of 3.7, glucose of     539, chloride of 101,  BUN of 13, creatinine of 1.0 and a bicarb of     23. 3. I-STAT Chem-8 also shows a hemoglobin of 14.6 and hematocrit of     43.0. 4. Urine microscopic analysis shows 3-6 wbc's with rare bacteria.  RADIOLOGICAL STUDIES: 1. CT scan of the abdomen and pelvis done in the ED shows diffuse     distention of the small bowel loops of 3.8 cm in maximal diameter     with fecalization of the long segment of mid ileum.  This extends     to the level of the transition point at the midportion of the     patient's anterior abdominal wall mesh.  This reflects either     obstruction or focal loss of small bowel motility secondary to the     overlying mesh, either due to right-sided invagination of the mesh     or adjacent adhesion.  Significant  surrounding soft tissue     stranding and fluid noted, reflecting associated inflammation,     small amount of free fluid noted tracking along the liver and on     the right side of the pelvis.  Cholelithiasis is also noted.  ASSESSMENT: 1. Small bowel obstruction, possibly related to the mesh that was used     to repair the ventral hernia. 2. Uncontrolled diabetes.  The patient is symptomatic with polyuria     and polydipsia.  PLAN: 1. The patient will be admitted to a regular medical bed. 2. She will be kept n.p.o. for now. 3. The patient is currently very reluctant to have an NG tube placed.     We will await surgical consultation for further recommendations.     Please also note that per the emergency department physician     documentation, he has already consulted [facility] surgery. 4. In regards to her diabetes, she has been started on a glucose     stabilizer protocol here in the ED and for now which we will     continue.  When her sugars are significantly low, we will     transition to  subcutaneous insulin as she is symptomatic.  We will     also measure HbA1c. 5. We will repeat a CBC and BMET as well.6. Further plan will depend as the patient's clinical course evolves     and further recommendations obtained from the General Surgical     team. 7. DVT prophylaxis with Lovenox. 8. Code status, full code.  Total time spent for admission 45 minutes.     Jeoffrey Massed, MD     SG/MEDQ  D:  09/17/2010  T:  09/17/2010  Job:  [id number]  Electronically Signed by Jeoffrey Massed  on 09/21/2010 12:26:57 PM

## 2010-10-06 NOTE — Consult Note (Signed)
[name], [name] NO.:  [id number]  MEDICAL RECORD NO.:  [id number]  LOCATION:  5125                         FACILITY:  [facility]  PHYSICIAN:  Lorne Skeens. Skylinn Vialpando, M.D.DATE OF BIRTH:  02-22-1970  DATE OF CONSULTATION:  09/17/2010 DATE OF DISCHARGE:                                CONSULTATION   REQUESTING PHYSICIAN:  Jeoffrey Massed, MD  REASON FOR CONSULTATION:  Small bowel obstruction.  HISTORY OF PRESENT ILLNESS:  [name] is a 40 year old African female who has had a ventral hernia repair in 2007 with Kugel mesh who had an episode of abdominal pain in 2011.  At that time, she presented to the emergency department where she had a CT scan, which revealed a questionable small bowel obstruction with an indeterminate cause.  This appeared to be right under her Kugel mesh.  The patient at that time was not admitted and apparently resolved on her own.  Last night around 9 p.m., the patient states she developed periumbilical abdominal pain.  She denies any nausea or vomiting.  She has not passed any flatus since then and has not had a bowel movement for 4 days.  She states she otherwise has bowel movements on an every-other-day basis. The patient does not take a normal bowel regimen at home.  She presented to the emergency department last night because of this pain.  She had a CT scan at this time, which actually looked almost identical to her CT scan a year ago.  At this time, it has been read as diffuse distention of small bowel loops up to 3.8 cm.  There is equalization of a long segment in the mid ileum.  This extends to a level of a transition point at the midportion of the patient's anterior abdominal wall mesh.  This may reflect either an obstruction or focal small bowel motility secondary to the overlying mesh.  Because of this finding, we have been asked to evaluate the patient for further recommendations.  REVIEW OF SYSTEMS:  Please see HPI.   Otherwise, all other systems have been reviewed and are negative.  FAMILY HISTORY:  Noncontributory.  PAST MEDICAL HISTORY:  Uncontrolled diabetes mellitus.  PAST SURGICAL HISTORY: 1. C-section x2. 2. Ventral hernia repair with Kugel mesh.  SOCIAL HISTORY:  The patient is single.  She has two children.  She works in housekeeping.  She denies any alcohol, tobacco, or illicit drug abuse.  ALLERGIES:  NKDA.  MEDICATIONS AT HOME:  She is unable to provide.  She apparently takes a medication for her diabetes.  PHYSICAL EXAMINATION:  GENERAL:  [name] is a very pleasant well- developed, well-nourished 40 year old African female who is currently lying in bed in no acute distress. VITAL SIGNS:  Temperature 96.2, pulse 76, respirations 18, blood pressure 161/88. HEENT:  Head is normocephalic, atraumatic.  Sclerae noninjected.  Pupils are equal, round, and reactive to light.  Ears and nose without any obvious masses or lesions.  No rhinorrhea.  Mouth is pink.  Throat shows no exudate. HEART:  Regular rate and rhythm.  Normal S1 and S2.  No murmurs, gallops, or rubs are noted.  She does have palpable carotid, radial, and pedal pulses  bilaterally. LUNGS:  Clear to auscultation bilaterally with no wheezes, rhonchi, or rales noted.  Respiratory effort is nonlabored. ABDOMEN:  Soft and tender around her umbilicus.  She otherwise is nondistended with active bowel sounds.  Her mesh is palpable and quite hard around her umbilicus.  Otherwise, she does not have any masses, hernias, or organomegaly noted.  She does have a lower midline incision, which is visible. MUSCULOSKELETAL:  All four extremities are symmetrical with no cyanosis, clubbing, or edema. SKIN:  Warm and dry with no mass lesions or rashes. PSYCHIATRIC:  The patient is alert and oriented x3 with an appropriate affect.  LABS AND DIAGNOSTICS:  White blood cell count is 10,800, hemoglobin 12.3, hematocrit 36.5, platelet count  is 269,000.  Lactic acid is 1.6. Sodium 136, potassium 3.7, glucose 539, BUN 13, creatinine 1.0.  CT scan of the abdomen and pelvis reveals diffuse distention of small bowel loops with equalization of a long segment of the mid ileum.  This extends to the level of the transition point in the midportion of the patient's anterior abdominal wall mesh.  This reflects severe obstruction with focal loss of small bowel motility secondary to the overlying mesh.  She does have some significant surrounding soft tissue stranding and fluid noted reflecting associated inflammation with a small amount of free fluid noted tracking about the liver on the right side of the pelvis.  IMPRESSION: 1. Small bowel obstruction versus dysmotility of a segment of small     bowel. 2. Uncontrolled diabetes mellitus.  PLAN:  The patient appears to have almost an identical CT scan currently as she did 1 year ago.  Her episode a year ago apparently spontaneously resolved.  Her CT scan at this point is indeterminate as to whether this is truly a partial small bowel obstruction versus a dysmotility as she has a segment of small bowel just inferior to her piece of mesh.  We would recommend bowel rest and conservative treatment at this time.  If the patient's symptoms do not resolve, she may need some type of surgical intervention.  If her symptoms do resolve on their own, the patient would likely benefit from a daily bowel regimen.  I agree with holding off on NG tube placement as the patient is without any nausea or vomiting.  We will repeat abdominal films in the morning.  We will follow the patient along with you.     Letha Cape, PA   ______________________________ Lorne Skeens. Dejah Droessler, M.D.    KEO/MEDQ  D:  09/17/2010  T:  09/17/2010  Job:  [id number]  cc:   Jeoffrey Massed, MD  Electronically Signed by Barnetta Chapel PA on 09/29/2010 01:27:38 PM Electronically Signed by Glenna Fellows M.D. on  10/06/2010 01:32:46 PM

## 2010-10-07 LAB — URINALYSIS, ROUTINE W REFLEX MICROSCOPIC
Bilirubin Urine: NEGATIVE
Glucose, UA: NEGATIVE
Hgb urine dipstick: NEGATIVE
Ketones, ur: 15 — AB
Nitrite: NEGATIVE
Protein, ur: NEGATIVE
Specific Gravity, Urine: 1.028
Urobilinogen, UA: 1
pH: 5.5

## 2010-10-07 LAB — BASIC METABOLIC PANEL
BUN: 10
CO2: 31
Calcium: 9.6
Chloride: 102
Creatinine, Ser: 0.84
GFR calc Af Amer: >60
GFR calc non Af Amer: >60
Glucose, Bld: 140 — ABNORMAL HIGH
Potassium: 3.4 — ABNORMAL LOW
Sodium: 141

## 2010-10-07 LAB — POCT CARDIAC MARKERS
CKMB, poc: 2.9
Troponin i, poc: <0.05

## 2010-10-07 LAB — URINE MICROSCOPIC-ADD ON

## 2010-10-07 LAB — DIFFERENTIAL
Basophils Absolute: 0
Basophils Relative: 0
Eosinophils Absolute: 0.1
Eosinophils Relative: 1
Lymphocytes Relative: 29
Lymphs Abs: 2.5
Monocytes Absolute: 0.6
Monocytes Relative: 7
Neutro Abs: 5.4
Neutrophils Relative %: 63

## 2010-10-07 LAB — CBC
HCT: 39.1
Hemoglobin: 13.1
MCHC: 33.6
MCV: 79.8
Platelets: 243
RBC: 4.9
RDW: 14.5
WBC: 8.6

## 2010-10-07 LAB — POCT PREGNANCY, URINE: Preg Test, Ur: NEGATIVE

## 2010-10-07 LAB — URINE CULTURE

## 2010-10-20 ENCOUNTER — Inpatient Hospital Stay (HOSPITAL_COMMUNITY)
Admission: EM | Admit: 2010-10-20 | Discharge: 2010-10-22 | DRG: 309 | Disposition: A | Discharge status: Home / Self Care | Payer: 59 | Attending: Internal Medicine | Admitting: Internal Medicine

## 2010-10-20 ENCOUNTER — Emergency Department (HOSPITAL_COMMUNITY): Payer: 59

## 2010-10-20 DIAGNOSIS — I4891 Unspecified atrial fibrillation: Secondary | ICD-10-CM

## 2010-10-20 DIAGNOSIS — E118 Type 2 diabetes mellitus with unspecified complications: Secondary | ICD-10-CM | POA: Diagnosis present

## 2010-10-20 DIAGNOSIS — E871 Hypo-osmolality and hyponatremia: Secondary | ICD-10-CM | POA: Diagnosis present

## 2010-10-20 DIAGNOSIS — IMO0002 Reserved for concepts with insufficient information to code with codable children: Secondary | ICD-10-CM | POA: Diagnosis present

## 2010-10-20 LAB — LIPID PANEL
HDL: 50 mg/dL (ref >39)
LDL Cholesterol: 30 mg/dL (ref 0–99)
Total CHOL/HDL Ratio: 1.9 RATIO
VLDL: 16 mg/dL (ref 0–40)

## 2010-10-20 LAB — GLUCOSE, CAPILLARY
Glucose-Capillary: 546 mg/dL — ABNORMAL HIGH (ref 70–99)
Glucose-Capillary: 390 mg/dL — ABNORMAL HIGH (ref 70–99)
Glucose-Capillary: 249 mg/dL — ABNORMAL HIGH (ref 70–99)
Glucose-Capillary: 115 mg/dL — ABNORMAL HIGH (ref 70–99)
Glucose-Capillary: 150 mg/dL — ABNORMAL HIGH (ref 70–99)
Glucose-Capillary: 152 mg/dL — ABNORMAL HIGH (ref 70–99)
Glucose-Capillary: 153 mg/dL — ABNORMAL HIGH (ref 70–99)
Glucose-Capillary: 436 mg/dL — ABNORMAL HIGH (ref 70–99)
Glucose-Capillary: 291 mg/dL — ABNORMAL HIGH (ref 70–99)

## 2010-10-20 LAB — COMPREHENSIVE METABOLIC PANEL
ALT: 20 U/L (ref 0–35)
Albumin: 4.3 g/dL (ref 3.5–5.2)
Alkaline Phosphatase: 141 U/L — ABNORMAL HIGH (ref 39–117)
BUN: 13 mg/dL (ref 6–23)
Potassium: 4 mEq/L (ref 3.5–5.1)
Sodium: 132 mEq/L — ABNORMAL LOW (ref 135–145)
Total Protein: 8.9 g/dL — ABNORMAL HIGH (ref 6.0–8.3)

## 2010-10-20 LAB — MAGNESIUM: Magnesium: 2 mg/dL (ref 1.5–2.5)

## 2010-10-20 LAB — URINALYSIS, ROUTINE W REFLEX MICROSCOPIC
Bilirubin Urine: NEGATIVE
Nitrite: NEGATIVE
Specific Gravity, Urine: <1.005 — ABNORMAL LOW (ref 1.005–1.030)
pH: 7.5 (ref 5.0–8.0)

## 2010-10-20 LAB — CARDIAC PANEL(CRET KIN+CKTOT+MB+TROPI)
CK, MB: 1.9 ng/mL (ref 0.3–4.0)
Relative Index: 1.6 (ref 0.0–2.5)
Relative Index: 1.7 (ref 0.0–2.5)
Relative Index: INVALID (ref 0.0–2.5)
Total CK: 125 U/L (ref 7–177)
Total CK: 110 U/L (ref 7–177)
Troponin I: <0.3 ng/mL (ref <0.30)

## 2010-10-20 LAB — CBC
Hemoglobin: 13.7 g/dL (ref 12.0–15.0)
RBC: 5.25 MIL/uL — ABNORMAL HIGH (ref 3.87–5.11)

## 2010-10-20 LAB — POCT I-STAT TROPONIN I: Troponin i, poc: 0 ng/mL (ref 0.00–0.08)

## 2010-10-20 LAB — DIFFERENTIAL
Basophils Absolute: 0.1 10*3/uL (ref 0.0–0.1)
Basophils Relative: 1 % (ref 0–1)
Neutro Abs: 4.5 10*3/uL (ref 1.7–7.7)
Neutrophils Relative %: 49 % (ref 43–77)

## 2010-10-20 LAB — URINE MICROSCOPIC-ADD ON

## 2010-10-21 DIAGNOSIS — I059 Rheumatic mitral valve disease, unspecified: Secondary | ICD-10-CM

## 2010-10-21 LAB — PROTIME-INR: INR: 1.03 (ref 0.00–1.49)

## 2010-10-21 LAB — CBC
HCT: 40.4 % (ref 36.0–46.0)
Hemoglobin: 13.3 g/dL (ref 12.0–15.0)
MCV: 78.4 fL (ref 78.0–100.0)
Platelets: 278 10*3/uL (ref 150–400)
RDW: 13.7 % (ref 11.5–15.5)

## 2010-10-21 LAB — COMPREHENSIVE METABOLIC PANEL
ALT: 15 U/L (ref 0–35)
AST: 16 U/L (ref 0–37)
Albumin: 3.2 g/dL — ABNORMAL LOW (ref 3.5–5.2)
CO2: 29 mEq/L (ref 19–32)
Calcium: 9.1 mg/dL (ref 8.4–10.5)
Creatinine, Ser: 0.64 mg/dL (ref 0.50–1.10)
GFR calc non Af Amer: >90 mL/min (ref >90)
Sodium: 139 mEq/L (ref 135–145)

## 2010-10-21 LAB — GLUCOSE, CAPILLARY
Glucose-Capillary: 181 mg/dL — ABNORMAL HIGH (ref 70–99)
Glucose-Capillary: 333 mg/dL — ABNORMAL HIGH (ref 70–99)
Glucose-Capillary: 191 mg/dL — ABNORMAL HIGH (ref 70–99)

## 2010-10-21 LAB — DIFFERENTIAL
Basophils Absolute: 0.1 10*3/uL (ref 0.0–0.1)
Lymphocytes Relative: 49 % — ABNORMAL HIGH (ref 12–46)
Lymphs Abs: 4.8 10*3/uL — ABNORMAL HIGH (ref 0.7–4.0)
Monocytes Absolute: 0.7 10*3/uL (ref 0.1–1.0)
Neutro Abs: 3.8 10*3/uL (ref 1.7–7.7)

## 2010-10-21 LAB — URINE DRUGS OF ABUSE SCREEN W ALC, ROUTINE (REF LAB)
Barbiturate Quant, Ur: NEGATIVE
Benzodiazepines.: NEGATIVE
Cocaine Metabolites: NEGATIVE
Ethyl Alcohol: <10 mg/dL (ref <10)
Opiate Screen, Urine: NEGATIVE
Phencyclidine (PCP): NEGATIVE

## 2010-10-21 LAB — HEPARIN LEVEL (UNFRACTIONATED): Heparin Unfractionated: 0.27 IU/mL — ABNORMAL LOW (ref 0.30–0.70)

## 2010-10-22 LAB — DIFFERENTIAL
Basophils Absolute: 0.1 10*3/uL (ref 0.0–0.1)
Basophils Relative: 1 % (ref 0–1)
Eosinophils Relative: 4 % (ref 0–5)
Monocytes Absolute: 0.7 10*3/uL (ref 0.1–1.0)
Monocytes Relative: 7 % (ref 3–12)
Neutro Abs: 3.6 10*3/uL (ref 1.7–7.7)

## 2010-10-22 LAB — GLUCOSE, CAPILLARY: Glucose-Capillary: 412 mg/dL — ABNORMAL HIGH (ref 70–99)

## 2010-10-22 LAB — BASIC METABOLIC PANEL
CO2: 29 mEq/L (ref 19–32)
Glucose, Bld: 106 mg/dL — ABNORMAL HIGH (ref 70–99)
Potassium: 3.2 mEq/L — ABNORMAL LOW (ref 3.5–5.1)
Sodium: 141 mEq/L (ref 135–145)

## 2010-10-22 LAB — HEPARIN LEVEL (UNFRACTIONATED): Heparin Unfractionated: 0.49 IU/mL (ref 0.30–0.70)

## 2010-10-22 LAB — CBC
Hemoglobin: 13.7 g/dL (ref 12.0–15.0)
MCH: 25.7 pg — ABNORMAL LOW (ref 26.0–34.0)
MCH: 25.5 pg — ABNORMAL LOW (ref 26.0–34.0)
MCHC: 33 g/dL (ref 30.0–36.0)
MCHC: 33.1 g/dL (ref 30.0–36.0)
MCV: 77.8 fL — ABNORMAL LOW (ref 78.0–100.0)
Platelets: 302 10*3/uL (ref 150–400)
RDW: 13.9 % (ref 11.5–15.5)
RDW: 14 % (ref 11.5–15.5)

## 2010-10-22 LAB — GLUCOSE, RANDOM: Glucose, Bld: 285 mg/dL — ABNORMAL HIGH (ref 70–99)

## 2010-10-22 NOTE — Discharge Summary (Signed)
[name], [name] NO.:  [id number]  MEDICAL RECORD NO.:  [id number]  LOCATION:  2039                         FACILITY:  [facility]  PHYSICIAN:  Andreas Blower, MD       DATE OF BIRTH:  Nov 12, 1970  DATE OF ADMISSION:  10/20/2010 DATE OF DISCHARGE:  10/22/2010                              DISCHARGE SUMMARY   PRIMARY CARE PHYSICIAN:  Fleet Contras, MD with [facility].  CONSULTING PHYSICIAN:  [facility], Dr. Valera Castle.  DISCHARGE DIAGNOSES: 1. Atrial fibrillation with rapid ventricular response, rate     controlled at the time of discharge. 2. Uncontrolled diabetes. 3. History of small bowel obstruction in September 2012, resolved. 4. History of gestational diabetes.  DISCHARGE MEDICATIONS: 1. Aspirin 325 mg p.o. daily. 2. Diltiazem 180 mg extended release p.o. daily. 3. NovoLog 5 units subcu 3 times a day with meals. 4. Warfarin 5 mg p.o. q.p.m. 5. Lantus 30 unit subcu daily at bedtime.BRIEF ADMITTING HISTORY AND PHYSICAL:  [name] is a 40 year old female who immigrated from [place] to [place].  The patient was recently hospitalized and was diagnosed with new onset of diabetes who presented on October 20, 2010, with complaints of shortness of breath and tachycardia.  She was also found to be hyperglycemic with blood sugar greater than 500.  RADIOLOGY/IMAGING:  The patient had a portable chest x-ray, which shows no evidence of active pulmonary disease.  The patient had a 2D echocardiogram, which showed left ventricular cavity size was normal, wall thickness was normal, systolic function was normal.  Ejection fraction was 55% to 65%.  Mitral valve showed anterior leaflet mass, which may represent vegetation, fibroma, chordal/leaflet injury.  The patient had a transesophageal echocardiogram on October 21, 2010, which showed LVEF was 60%.  Right ventricle was normal.  Left atrium was dilated.  Aortic valve showed trileaflet valve  with trivial aortic insufficiency without aortic stenosis.  Mitral valve showed mildly rheumatic appearing.  Mild calcification of the leaflets.  Trivial MR and MS.  No mass noted.  No clot noted in the left ventricle.  LABORATORY DATA:  CBC shows a white count of 9.9, hemoglobin 13.7, hematocrit 41.4, platelet count 333.  Electrolytes normal except potassium is 3.2 with a BUN of 9, creatinine 0.71.  UA is negative for nitrites and leukocytes.  Urine drug screen is negative.  TSH is 1.414, free T4 is 1.63.  LDL is 30.  BNP is 72.6.  HOSPITAL COURSE BY PROBLEMS: 1. AFib with RVR.  Initially the patient was admitted to step-down.     The patient was started on IV diltiazem, which was transitioned to     oral.  Cardiology was consulted.  The patient had initially a     transthoracic echocardiogram with results as indicated above, which     showed possible mass.  As a result, transesophageal echocardiogram     was obtained for better evaluation.  It did not show the mass.  No     clots were also noted.  Cardiology recommended the patient     initially be started on heparin.  At discharge, the patient will be     discharged home  on Coumadin.  Further titration of Coumadin to be     done as an outpatient.  The patient is currently on aspirin, we     will defer to Cardiology as to when aspirin should be discontinued.     Her INR is subtherapeutic at this time.  As a result, we will     continue aspirin. 2. Uncontrolled diabetes.  The patient was started initially on home     insulin regimen.  The patient's Lantus was titrated up as     tolerated.  The patient was also started on mealtime insulin.     Given her hemoglobin A1c is elevated at 15.2, uncertain if the     patient would have any added benefit with metformin as she is     already on insulin. Further titration of insulin to be done as an     outpatient. 3. History of small-bowel obstruction.  It was not an active issue     during  the course of hospital stay.  DISPOSITION AND FOLLOWUP:  The patient was instructed to follow up with her primary care physician, Dr. Concepcion Elk in 1-2 weeks for further titration of her diabetic regimen.  The patient to follow up with Tereso Newcomer, physician's assistant working with Dr. Daleen Squibb with [facility] on November 10, 2010, at 9:30 a.m.  The patient to follow up with [facility] on October 27, 2010, at 3:45 p.m. for PT/INR check.  Time spent on discharge, talking to the patient, consultants, and coordinating care was 35 minutes.     Andreas Blower, MD     SR/MEDQ  D:  10/22/2010  T:  10/22/2010  Job:  [id number]  Electronically Signed by Wardell Heath Johara Lodwick  on 10/22/2010 08:53:32 PM

## 2010-10-26 NOTE — Consult Note (Signed)
NAMEYAMAIRA, SPINNER NO.:  [id number]  MEDICAL RECORD NO.:  [id number]  LOCATION:  2928                         FACILITY:  [facility]  PHYSICIAN:  Jesse Sans. Rumi Taras, MD, FACCDATE OF BIRTH:  08-06-1970  DATE OF CONSULTATION:  10/20/2010 DATE OF DISCHARGE:                                CONSULTATION   CHIEF COMPLAINT:  Rapid heartbeat.  HISTORY OF PRESENT ILLNESS:  Ms. Shayleen Eppinger is a 40 year old female who presented to the emergency room with a chief complaint of palpitations and shortness of breath.  She awoke with these symptoms about 1:30 in the morning.  In the emergency room, she was found to be and atrial fibrillation, which is new with a rapid ventricular rate.  She had no chest pain or shortness of breath.  There is no nausea, vomiting, or diaphoresis.  She denies any syncope.  She has slowed nicely with IV diltiazem and is currently running about 70- 80 beats per minute with 5 mg/hour of diltiazem.  She uses a moderate amount of caffeine daily, but denies any alcohol, tobacco, or drug use.  She does not use any over-the-counter stimulants or decongestants.  TSH on recent admission was normal.  There is no history of thyroid disease.  She was recently diagnosed with type 2 diabetes when she was admitted for small bowel obstruction about a month ago.  She has a history of gestational diabetes.  A 2D echocardiogram has been ordered and is pending.  PAST MEDICAL HISTORY:  She has no known drug allergies.  MEDICATIONS AT HOME: 1. Metformin 500 mg p.o. b.i.d. 2. Insulin 32 units q.a.m. 3. She has received aspirin, enoxaparin 40 mg subcu, and the     diltiazem.  PAST MEDICAL HISTORY:  In addition to the above, she has had no surgeries.  SOCIAL HISTORY:  She is an immigrant from [place].  She lives in [place] by herself.  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  FAMILY HISTORY:  Unknown, mother and father.  Siblings, she has  6 sisters, but does not know their health status.  REVIEW OF SYSTEMS:  No history of bleeding diathesis.  No history of melena, hemoptysis, or hematochezia.  The rest review of systems are negative other than HPI.  PHYSICAL EXAMINATION:  GENERAL:  She is a very pleasant lady in no acute distress. VITAL SIGNS:  Her blood pressure is 131/71, her pulse is 70 and irregular, her respirations are 13, sats 100% on 2 L.  She is in no acute distress. HEENT:  PERRLA.  Extraocular movements intact.  Sclerae clear.  Facial symmetry is normal. NECK:  Supple.  No carotid bruits.  No JVD. CARDIOVASCULAR:  PMI nondisplaced.  Irregular rhythm and rate is well controlled.  Variable S1, S2.  No obvious murmur.  Pulses are 2+ throughout without bruits. LUNGS:  Clear to auscultation and percussion. SKIN:  No rashes or lesions. ABDOMEN:  Soft, good bowel sounds.  No hepatomegaly and no tenderness. EXTREMITIES:  No cyanosis, clubbing, edema. MUSCULOSKELETAL:  No obvious joint deformity or effusions. NEURO:  Intact.  She is alert and oriented x3.  Normal affect.  Chest x-ray:  No active cardiopulmonary disease.  EKG  showed atrial fibrillation with rapid ventricular rate, nonspecific ST segment changes.  Labs unremarkable and reviewed.  Recent TSH was normal.  Drug screen apparently is pending.  TSH repeat and free T4 pending.  2D echo pending.  ASSESSMENT AND PLAN:  Paroxysmal atrial fibrillation, new onset.  She is currently asymptomatic with rates in the 70s on low-dose IV diltiazem. Her CHADS2-VASc score is only 1, but we will place on IV heparin for now in case she needs cardioversion.  We will discharge her only on 325 mg per day of aspirin if she converts to sinus rhythm.  Echo is normal.  We would discharge her on diltiazem extended release 180 mg per day with short-acting diltiazem 60 mg p.r.n. for breakthrough atrial fibrillation.     Diana Davenport C. Daleen Squibb, MD, [facility]     TCW/MEDQ  D:   10/20/2010  T:  10/21/2010  Job:  [id number]  Electronically Signed by Valera Castle MD [facility] on 10/26/2010 01:52:05 PM

## 2010-10-27 ENCOUNTER — Encounter: Payer: 59 | Admitting: *Deleted

## 2010-11-07 ENCOUNTER — Encounter: Payer: Self-pay | Admitting: *Deleted

## 2010-11-07 NOTE — H&P (Signed)
[name], [name]              ACCOUNT NO.:  [id number]  MEDICAL RECORD NO.:  [id number]  LOCATION:  2913                         FACILITY:  [facility]  PHYSICIAN:  Lonia Blood, M.D.      DATE OF BIRTH:  1970/09/25  DATE OF ADMISSION:  10/20/2010 DATE OF DISCHARGE:                             HISTORY & PHYSICAL   PRIMARY CARE PHYSICIAN:  She is unassigned.  PRESENTING COMPLAINT:  Tachycardia.  HISTORY OF PRESENT ILLNESS:  The patient is a 40 year old female who is an immigrant from [place] in [place].  She was recently in the hospital and diagnosed with new onset diabetes.  She was sent home on September 18, 2010, on Lantus and metformin.  She has been taking her medications and doing well.  She suddenly woke up today with severe palpitations and mild shortness of breath.  She decided to come to the emergency room for further management.  In the ED, she was found to have new onset atrial fibrillation with rate as high as 160.  She was also found to be hyperglycemic with a sugar over 500.  She is therefore being admitted for further management.  PAST MEDICAL HISTORY: 1. Diabetes type 2. 2. Small bowel obstruction. 3. History of gestational diabetes.  ALLERGIES:  She has no known drug allergies.  MEDICATIONS:  Should have been Lantus 22 units subcutaneously nightly and metformin 500 mg p.o. b.i.d.  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  Denied any tobacco, alcohol, or IV drug use.  FAMILY HISTORY:  Noncontributory.  REVIEW OF SYSTEMS:  All systems reviewed are currently negative except per HPI.  PHYSICAL EXAMINATION:  VITAL SIGNS:  Temperature 98.9, blood pressure 156/126, her pulse of 160, respiratory rate of 20, and sats 96% on room air. GENERAL:  She is awake, alert, oriented.  She is in no acute distress. HEENT:  PERRLA.  EOMI.  No pallor, no jaundice, no rhinorrhea. NECK:  Supple.  No JVD.  No  lymphadenopathy. RESPIRATORY:  She has good air entry bilaterally.  No wheezes, no rales, no crackles. CARDIOVASCULAR:  S1, S2.  No audible murmur. ABDOMEN:  Soft, full, nontender with positive bowel sounds. EXTREMITIES:  No edema, cyanosis, or clubbing. SKIN:  No rashes, no ulcers.  LABS:  Her white count is 9.4, hemoglobin 13.7 with platelet count of 283.  Her troponin is negative.  Magnesium was 2.06.  Sodium 132, potassium 4.0, chloride 93, CO2 of 26, glucose 660, BUN 13, creatinine 0.93, total bilirubin is 0.3, alkaline phosphatase is 141, total protein is 8.9 with albumin of 4.2 and calcium 9.8.  Urinalysis showed straw urine with glucosuria only.  Urine microscopy is also negative.  Her chest x-ray showed no evidence of active pulmonary disease.  ASSESSMENT:  This is a 40 year old female with new onset atrial fibrillation with rapid ventricular response and also nonketotic hyperosmolar hyperglycemia.  PLAN: 1. New onset AFib.  We will admit the patient to Step-Down Unit, start     her on IV Cardizem and then oral Cardizem as necessary.  Get     cardiology consult.  The patient's CHAD2 score is low risk for  chronic anticoagulation.  I will only put her on aspirin at this     point unless Cardiology wants to do anything different.  We will     check TSH and free T4.  Per the patient, she takes 2 cups of coffee     a day and I have counseled her against that with no tobacco, no     other stimulants.  She does not take a lot of caffeinated drinks. 2. Diabetes.  We will continue with Lantus insulin and sliding scale     insulin once she is off all glucose stabilizer per protocol. 3. Pseudohyponatremia.  Again hydrate the patient to correct her     sugar.  Hopefully, that will correct the sodium as well. 4. Dehydration from her hyperglycemia.  Once that is corrected, she     should correct also.     Lonia Blood, M.D.     Verlin Grills  D:  10/20/2010  T:  10/20/2010  Job:   [id number]  Electronically Signed by Lonia Blood M.D. on 11/07/2010 05:54:15 AM

## 2010-11-10 ENCOUNTER — Encounter: Payer: 59 | Admitting: Physician Assistant

## 2011-02-26 ENCOUNTER — Other Ambulatory Visit: Payer: Self-pay | Admitting: Obstetrics & Gynecology

## 2011-02-26 DIAGNOSIS — Z1231 Encounter for screening mammogram for malignant neoplasm of breast: Secondary | ICD-10-CM

## 2011-03-23 ENCOUNTER — Ambulatory Visit (HOSPITAL_COMMUNITY)
Admission: RE | Admit: 2011-03-23 | Discharge: 2011-03-23 | Disposition: A | Discharge status: Home / Self Care | Payer: 59 | Source: Ambulatory Visit | Attending: Obstetrics & Gynecology | Admitting: Obstetrics & Gynecology

## 2011-03-23 DIAGNOSIS — Z1231 Encounter for screening mammogram for malignant neoplasm of breast: Secondary | ICD-10-CM

## 2011-06-12 ENCOUNTER — Other Ambulatory Visit: Payer: Self-pay | Admitting: Obstetrics & Gynecology

## 2011-06-12 DIAGNOSIS — R198 Other specified symptoms and signs involving the digestive system and abdomen: Secondary | ICD-10-CM

## 2011-06-15 ENCOUNTER — Ambulatory Visit (HOSPITAL_COMMUNITY)
Admission: RE | Admit: 2011-06-15 | Discharge: 2011-06-15 | Disposition: A | Discharge status: Home / Self Care | Payer: 59 | Source: Ambulatory Visit | Attending: Obstetrics & Gynecology | Admitting: Obstetrics & Gynecology

## 2011-06-15 ENCOUNTER — Other Ambulatory Visit: Payer: Self-pay | Admitting: Obstetrics & Gynecology

## 2011-06-15 DIAGNOSIS — R1900 Intra-abdominal and pelvic swelling, mass and lump, unspecified site: Secondary | ICD-10-CM | POA: Insufficient documentation

## 2011-06-15 DIAGNOSIS — R142 Eructation: Secondary | ICD-10-CM | POA: Insufficient documentation

## 2011-06-15 DIAGNOSIS — R198 Other specified symptoms and signs involving the digestive system and abdomen: Secondary | ICD-10-CM

## 2011-06-15 DIAGNOSIS — R143 Flatulence: Secondary | ICD-10-CM | POA: Insufficient documentation

## 2011-06-15 DIAGNOSIS — K8020 Calculus of gallbladder without cholecystitis without obstruction: Secondary | ICD-10-CM | POA: Insufficient documentation

## 2011-06-15 DIAGNOSIS — R141 Gas pain: Secondary | ICD-10-CM | POA: Insufficient documentation

## 2011-06-15 DIAGNOSIS — K7689 Other specified diseases of liver: Secondary | ICD-10-CM | POA: Insufficient documentation

## 2011-06-15 MED ORDER — IOHEXOL 300 MG/ML  SOLN
100.0000 mL | Freq: Once | INTRAMUSCULAR | Status: AC | PRN
  Administered 2011-06-15: 100 mL via INTRAVENOUS

## 2011-07-20 ENCOUNTER — Encounter (HOSPITAL_COMMUNITY): Payer: Self-pay | Admitting: Emergency Medicine

## 2011-07-20 ENCOUNTER — Emergency Department (HOSPITAL_COMMUNITY)
Admission: EM | Admit: 2011-07-20 | Discharge: 2011-07-20 | Disposition: A | Discharge status: Home / Self Care | Payer: 59 | Attending: Emergency Medicine | Admitting: Emergency Medicine

## 2011-07-20 ENCOUNTER — Emergency Department (HOSPITAL_COMMUNITY): Payer: 59

## 2011-07-20 DIAGNOSIS — R739 Hyperglycemia, unspecified: Secondary | ICD-10-CM

## 2011-07-20 DIAGNOSIS — E119 Type 2 diabetes mellitus without complications: Secondary | ICD-10-CM | POA: Insufficient documentation

## 2011-07-20 DIAGNOSIS — R61 Generalized hyperhidrosis: Secondary | ICD-10-CM | POA: Insufficient documentation

## 2011-07-20 DIAGNOSIS — R42 Dizziness and giddiness: Secondary | ICD-10-CM | POA: Insufficient documentation

## 2011-07-20 DIAGNOSIS — I10 Essential (primary) hypertension: Secondary | ICD-10-CM | POA: Insufficient documentation

## 2011-07-20 DIAGNOSIS — I4891 Unspecified atrial fibrillation: Secondary | ICD-10-CM | POA: Insufficient documentation

## 2011-07-20 DIAGNOSIS — R55 Syncope and collapse: Secondary | ICD-10-CM | POA: Insufficient documentation

## 2011-07-20 DIAGNOSIS — R000 Tachycardia, unspecified: Secondary | ICD-10-CM | POA: Insufficient documentation

## 2011-07-20 HISTORY — DX: Essential (primary) hypertension: I10

## 2011-07-20 LAB — BASIC METABOLIC PANEL
Calcium: 9.4 mg/dL (ref 8.4–10.5)
GFR calc non Af Amer: >90 mL/min (ref >90)
Sodium: 139 mEq/L (ref 135–145)

## 2011-07-20 LAB — CBC WITH DIFFERENTIAL/PLATELET
Basophils Absolute: 0 10*3/uL (ref 0.0–0.1)
Eosinophils Absolute: 0.1 10*3/uL (ref 0.0–0.7)
Eosinophils Relative: 1 % (ref 0–5)
MCH: 25.5 pg — ABNORMAL LOW (ref 26.0–34.0)
MCHC: 33.2 g/dL (ref 30.0–36.0)
MCV: 77 fL — ABNORMAL LOW (ref 78.0–100.0)
Platelets: 280 10*3/uL (ref 150–400)
RDW: 15 % (ref 11.5–15.5)

## 2011-07-20 LAB — T4, FREE: Free T4: 1.31 ng/dL (ref 0.80–1.80)

## 2011-07-20 LAB — TROPONIN I: Troponin I: <0.3 ng/mL (ref <0.30)

## 2011-07-20 MED ORDER — DILTIAZEM HCL 30 MG PO TABS
30.0000 mg | ORAL_TABLET | ORAL | Status: AC
  Administered 2011-07-20: 30 mg via ORAL
  Filled 2011-07-20: qty 1

## 2011-07-20 MED ORDER — FLECAINIDE ACETATE 100 MG PO TABS
300.0000 mg | ORAL_TABLET | ORAL | Status: AC
  Administered 2011-07-20: 300 mg via ORAL
  Filled 2011-07-20: qty 3

## 2011-07-20 MED ORDER — DILTIAZEM HCL 100 MG IV SOLR
5.0000 mg/h | Freq: Once | INTRAVENOUS | Status: AC
  Administered 2011-07-20: 5 mg/h via INTRAVENOUS
  Filled 2011-07-20: qty 100

## 2011-07-20 NOTE — ED Notes (Signed)
Preston Fleeting, MD notified of pt's vital signs

## 2011-07-20 NOTE — ED Provider Notes (Signed)
Assumed care in CDU.  41 y/o female presents with near syncope episode.  Pt received flecainide and diltiazem.  Has converted.  Will continue to monitor, and plan to d/c at 1700 if remains sinus and asymptomatic.  Will give referral.    5:15 PM Patient states she is feeling much better. She is currently in sinus rhythm. She denies any chest pain, shortness of breath, or lightheadedness. Patient agrees to follow up with the primary care Dr. Patient will also receive referral to [facility] cardiology for further evaluation.  Fayrene Helper, PA-C 07/20/11 [phone]

## 2011-07-20 NOTE — ED Notes (Signed)
Potassium redrawn for ? hemolized 6.1 level dr Preston Fleeting aware

## 2011-07-20 NOTE — ED Provider Notes (Signed)
1300 report received from Dr. Preston Fleeting. New onset atrial fib with near syncopal episode. Patient has been given flecainide and was put on diltiazem drip. Will DC drip after oral diltiazem in one hour. Will observe in CDU to see if she converts.  1500 Patient's heart rate momentarily went up to 140 ST after moving to CDU.  Cardizem drip turned up to 10gtt.  Shortly after HR went down to 85 nsr.  Patient received po meds at 1430.  Drip was turned off at 1530.  Pt in nsr 80's.  Will follow up with [facility] cardiology.  No meds at discharge.  Will watch one hour.  Report given to oncoming PA Bowie.  No cp   Labs Reviewed  GLUCOSE, CAPILLARY - Abnormal; Notable for the following:    Glucose-Capillary 222 (*)     All other components within normal limits  CBC WITH DIFFERENTIAL - Abnormal; Notable for the following:    RBC 5.48 (*)     MCV 77.0 (*)     MCH 25.5 (*)     All other components within normal limits  BASIC METABOLIC PANEL - Abnormal; Notable for the following:    Potassium 6.1 (*)     Glucose, Bld 242 (*)     All other components within normal limits  TROPONIN I  POTASSIUM  T4, FREE  TSH    Remi Haggard, NP 07/20/11 1546

## 2011-07-20 NOTE — ED Provider Notes (Signed)
Medical screening examination/treatment/procedure(s) were conducted as a shared visit with non-physician practitioner(s) and myself.  I personally evaluated the patient during the encounter   Zamyia Gowell, MD 07/20/11 1627 

## 2011-07-20 NOTE — ED Notes (Signed)
Pt staes she has been drinking a lot of water and felt start to beat fast yesterday has not taken her bp meds today cng was 222

## 2011-07-20 NOTE — ED Notes (Signed)
Pt undressed, in gown, on monitor, continuous pulse oximetry and blood pressure cuff; EKG performed 

## 2011-07-20 NOTE — ED Notes (Signed)
Pt works here in housekeeping and had a syncopal episode this am sank to floor did not hit head . Pt is diabetic has not eaten today and has not checked sugar staes felt weak and her felt like her hear was racing

## 2011-07-20 NOTE — ED Provider Notes (Signed)
History     CSN: [id number]  Arrival date & time 07/20/11  7829   First MD Initiated Contact with Patient 07/20/11 0902      Chief Complaint  Patient presents with  . Near Syncope    (Consider location/radiation/quality/duration/timing/severity/associated sxs/prior treatment) The history is provided by the patient.   41 year old female noted onset last night at about 7 PM of her heart racing and episodic dizziness. She thought she might of been dehydrated and dry lot of all water with no improvement. She had a near syncopal event at work this morning. She is diabetic and did not take her medication this morning and did not eat breakfast. Nothing makes her symptoms better and nothing makes them worse. There is no associated chest pain, heaviness, tightness, pressure. There is no associated nausea, vomiting, dyspnea. She did have some diaphoresis. She's never had similar symptoms before.  Past Medical History  Diagnosis Date  . Diabetes mellitus   . Hypertension     No past surgical history on file.  No family history on file.  History  Substance Use Topics  . Smoking status: Never Smoker   . Smokeless tobacco: Not on file  . Alcohol Use: No    OB History    Grav Para Term Preterm Abortions TAB SAB Ect Mult Living                  Review of Systems  All other systems reviewed and are negative.    Allergies  Review of patient's allergies indicates no known allergies.  Home Medications  No current outpatient prescriptions on file.  BP 161/111  Pulse 130  Temp 97 F (36.1 C) (Oral)  Resp 18  SpO2 100%  Physical Exam  Nursing note and vitals reviewed.  41 year old female is resting comfortably and in no acute distress. Vital signs are significant for tachycardia with heart rate 130, and hypertension with blood pressure 161/111. Oxygen saturation is 100% which is normal. Head is normocephalic and atraumatic. PERRLA, EOMI. Oropharynx is clear. Neck is nontender  and supple without adenopathy, JVD, or bruit. Lungs are clear without rales, wheezes, or rhonchi. Heart is tachycardic and irregular without murmur. Abdomen is soft, flat, nontender without masses or hepatosplenomegaly. Extremities have no cyanosis or edema, full range of motion is present. Skin is warm and dry without rash. Neurologic: Mental status is normal, cranial nerves are intact, there are no motor or sensory deficits.  ED Course  Procedures (including critical care time)  Labs Reviewed - No data to display No results found.   Date: 07/20/2011  Rate: 134  Rhythm: atrial fibrillation  QRS Axis: normal  Intervals: normal  ST/T Wave abnormalities: nonspecific ST changes  Conduction Disutrbances:none  Narrative Interpretation: Atrial fibrillation with rapid ventricular response. Nonspecific ST changes which are most likely rate related. No old ECG available for comparison.  Old EKG Reviewed: none available    1. Atrial fibrillation with rapid ventricular response       MDM  Atrial fibrillation with rapid ventricular response. Near syncope is secondary to atrial fibrillation. She will be given to diltiazem drip for rate control.   Heart rate is well controlled on 5 mg per hour of diltiazem. I discussed case with Dr. Elease Hashimoto to see if she would be a good candidate for Flecainide for cardioversion. Dr. Elease Hashimoto agrees And she will be given a dose of flecainide 300 mg and observed. She is being switched from IV to oral diltiazem.  Dione Booze, MD 07/20/11 [phone]

## 2011-07-20 NOTE — Progress Notes (Signed)
Verified weight with RN Arline Asp R, per patient report, she weighs 82kg, ok for Tambocor 300mg  dose. Entered in [facility].  Noted patient previously on dilt gtt, now to be converted to Po Dilt. Thanks, Branko Steeves K. Allena Katz, PharmD Pgr [phone] 07/20/2011 1:26 PM

## 2011-07-21 NOTE — ED Provider Notes (Signed)
Medical screening examination/treatment/procedure(s) were conducted as a shared visit with non-physician practitioner(s) and myself.  I personally evaluated the patient during the encounter   Dione Booze, MD 07/21/11 [phone]

## 2012-05-23 ENCOUNTER — Encounter (HOSPITAL_COMMUNITY): Payer: Self-pay | Admitting: *Deleted

## 2012-05-23 ENCOUNTER — Inpatient Hospital Stay (HOSPITAL_COMMUNITY)
Admission: EM | Admit: 2012-05-23 | Discharge: 2012-05-24 | DRG: 309 | Disposition: A | Discharge status: Home / Self Care | Payer: 59 | Attending: Internal Medicine | Admitting: Internal Medicine

## 2012-05-23 ENCOUNTER — Emergency Department (HOSPITAL_COMMUNITY): Payer: 59

## 2012-05-23 DIAGNOSIS — R739 Hyperglycemia, unspecified: Secondary | ICD-10-CM

## 2012-05-23 DIAGNOSIS — E2609 Other primary hyperaldosteronism: Secondary | ICD-10-CM | POA: Insufficient documentation

## 2012-05-23 DIAGNOSIS — IMO0002 Reserved for concepts with insufficient information to code with codable children: Secondary | ICD-10-CM | POA: Diagnosis present

## 2012-05-23 DIAGNOSIS — I248 Other forms of acute ischemic heart disease: Secondary | ICD-10-CM | POA: Diagnosis present

## 2012-05-23 DIAGNOSIS — E1169 Type 2 diabetes mellitus with other specified complication: Secondary | ICD-10-CM | POA: Diagnosis present

## 2012-05-23 DIAGNOSIS — I2489 Other forms of acute ischemic heart disease: Secondary | ICD-10-CM | POA: Diagnosis present

## 2012-05-23 DIAGNOSIS — I10 Essential (primary) hypertension: Secondary | ICD-10-CM

## 2012-05-23 DIAGNOSIS — I4891 Unspecified atrial fibrillation: Principal | ICD-10-CM

## 2012-05-23 DIAGNOSIS — Z794 Long term (current) use of insulin: Secondary | ICD-10-CM

## 2012-05-23 DIAGNOSIS — R000 Tachycardia, unspecified: Secondary | ICD-10-CM | POA: Diagnosis present

## 2012-05-23 DIAGNOSIS — E1165 Type 2 diabetes mellitus with hyperglycemia: Secondary | ICD-10-CM | POA: Diagnosis present

## 2012-05-23 DIAGNOSIS — E119 Type 2 diabetes mellitus without complications: Secondary | ICD-10-CM

## 2012-05-23 HISTORY — DX: Paroxysmal atrial fibrillation: I48.0

## 2012-05-23 LAB — HEPARIN LEVEL (UNFRACTIONATED): Heparin Unfractionated: 0.21 IU/mL — ABNORMAL LOW (ref 0.30–0.70)

## 2012-05-23 LAB — URINALYSIS, ROUTINE W REFLEX MICROSCOPIC
Ketones, ur: NEGATIVE mg/dL
Leukocytes, UA: NEGATIVE
Protein, ur: 100 mg/dL — AB
Urobilinogen, UA: 0.2 mg/dL (ref 0.0–1.0)

## 2012-05-23 LAB — GLUCOSE, CAPILLARY
Glucose-Capillary: 206 mg/dL — ABNORMAL HIGH (ref 70–99)
Glucose-Capillary: 296 mg/dL — ABNORMAL HIGH (ref 70–99)

## 2012-05-23 LAB — CBC
MCHC: 34.2 g/dL (ref 30.0–36.0)
Platelets: 283 10*3/uL (ref 150–400)
RDW: 13.5 % (ref 11.5–15.5)
WBC: 11.2 10*3/uL — ABNORMAL HIGH (ref 4.0–10.5)

## 2012-05-23 LAB — URINE MICROSCOPIC-ADD ON

## 2012-05-23 LAB — HEMOGLOBIN A1C: Hgb A1c MFr Bld: 12.6 % — ABNORMAL HIGH (ref <5.7)

## 2012-05-23 LAB — POCT I-STAT TROPONIN I: Troponin i, poc: 0.01 ng/mL (ref 0.00–0.08)

## 2012-05-23 LAB — BASIC METABOLIC PANEL
Chloride: 92 mEq/L — ABNORMAL LOW (ref 96–112)
GFR calc Af Amer: >90 mL/min (ref >90)
GFR calc non Af Amer: 89 mL/min — ABNORMAL LOW (ref >90)
Potassium: 3.5 mEq/L (ref 3.5–5.1)
Sodium: 131 mEq/L — ABNORMAL LOW (ref 135–145)

## 2012-05-23 LAB — TROPONIN I: Troponin I: <0.3 ng/mL (ref <0.30)

## 2012-05-23 LAB — MRSA PCR SCREENING: MRSA by PCR: NEGATIVE

## 2012-05-23 MED ORDER — DILTIAZEM HCL 60 MG PO TABS
60.0000 mg | ORAL_TABLET | Freq: Four times a day (QID) | ORAL | Status: DC
  Administered 2012-05-23 – 2012-05-24 (×3): 60 mg via ORAL
  Filled 2012-05-23 (×8): qty 1

## 2012-05-23 MED ORDER — ONDANSETRON HCL 4 MG/2ML IJ SOLN: 4.0000 mg | Freq: Four times a day (QID) | INTRAMUSCULAR | Status: DC | PRN

## 2012-05-23 MED ORDER — INSULIN ASPART 100 UNIT/ML ~~LOC~~ SOLN
0.0000 [IU] | Freq: Three times a day (TID) | SUBCUTANEOUS | Status: DC
  Administered 2012-05-23: 11 [IU] via SUBCUTANEOUS
  Administered 2012-05-23: 7 [IU] via SUBCUTANEOUS
  Administered 2012-05-24: 15 [IU] via SUBCUTANEOUS

## 2012-05-23 MED ORDER — FLECAINIDE ACETATE 100 MG PO TABS
300.0000 mg | ORAL_TABLET | Freq: Once | ORAL | Status: AC
  Administered 2012-05-23: 300 mg via ORAL
  Filled 2012-05-23: qty 3

## 2012-05-23 MED ORDER — INSULIN GLARGINE 100 UNIT/ML ~~LOC~~ SOLN
45.0000 [IU] | Freq: Every day | SUBCUTANEOUS | Status: DC
  Administered 2012-05-23: 45 [IU] via SUBCUTANEOUS
  Filled 2012-05-23: qty 0.45

## 2012-05-23 MED ORDER — INSULIN ASPART 100 UNIT/ML ~~LOC~~ SOLN
4.0000 [IU] | Freq: Three times a day (TID) | SUBCUTANEOUS | Status: DC
  Administered 2012-05-23: 4 [IU] via SUBCUTANEOUS

## 2012-05-23 MED ORDER — SODIUM CHLORIDE 0.9 % IV BOLUS (SEPSIS)
1000.0000 mL | Freq: Once | INTRAVENOUS | Status: AC
  Administered 2012-05-23: 1000 mL via INTRAVENOUS

## 2012-05-23 MED ORDER — DILTIAZEM HCL 100 MG IV SOLR
5.0000 mg/h | INTRAVENOUS | Status: DC
  Administered 2012-05-23 (×2): 10 mg/h via INTRAVENOUS
  Filled 2012-05-23: qty 100

## 2012-05-23 MED ORDER — DILTIAZEM HCL 100 MG IV SOLR
10.0000 mg/h | Freq: Once | INTRAVENOUS | Status: AC
  Administered 2012-05-23: 10 mg/h via INTRAVENOUS

## 2012-05-23 MED ORDER — SODIUM CHLORIDE 0.9 % IJ SOLN
3.0000 mL | Freq: Two times a day (BID) | INTRAMUSCULAR | Status: DC
  Administered 2012-05-23 (×2): 3 mL via INTRAVENOUS

## 2012-05-23 MED ORDER — INSULIN ASPART 100 UNIT/ML ~~LOC~~ SOLN
0.0000 [IU] | Freq: Every day | SUBCUTANEOUS | Status: DC
  Administered 2012-05-23: 3 [IU] via SUBCUTANEOUS

## 2012-05-23 MED ORDER — ASPIRIN 81 MG PO CHEW
324.0000 mg | CHEWABLE_TABLET | Freq: Once | ORAL | Status: AC
  Administered 2012-05-23: 324 mg via ORAL

## 2012-05-23 MED ORDER — HEPARIN (PORCINE) IN NACL 100-0.45 UNIT/ML-% IJ SOLN
1450.0000 [IU]/h | INTRAMUSCULAR | Status: DC
  Administered 2012-05-23: 1450 [IU]/h via INTRAVENOUS
  Administered 2012-05-23: 1300 [IU]/h via INTRAVENOUS
  Filled 2012-05-23 (×3): qty 250

## 2012-05-23 MED ORDER — DILTIAZEM HCL 30 MG PO TABS
30.0000 mg | ORAL_TABLET | Freq: Four times a day (QID) | ORAL | Status: DC
  Administered 2012-05-23 (×2): 30 mg via ORAL
  Filled 2012-05-23 (×6): qty 1

## 2012-05-23 MED ORDER — INSULIN ASPART 100 UNIT/ML ~~LOC~~ SOLN: 0.0000 [IU] | Freq: Three times a day (TID) | SUBCUTANEOUS | Status: DC

## 2012-05-23 MED ORDER — ASPIRIN EC 81 MG PO TBEC
81.0000 mg | DELAYED_RELEASE_TABLET | Freq: Every day | ORAL | Status: DC
  Administered 2012-05-23: 81 mg via ORAL
  Filled 2012-05-23 (×3): qty 1

## 2012-05-23 MED ORDER — INSULIN GLARGINE 100 UNIT/ML ~~LOC~~ SOLN: 28.0000 [IU] | Freq: Every day | SUBCUTANEOUS | Status: DC

## 2012-05-23 MED ORDER — ASPIRIN 81 MG PO CHEW
CHEWABLE_TABLET | ORAL | Status: AC
  Filled 2012-05-23: qty 4

## 2012-05-23 MED ORDER — ACETAMINOPHEN 325 MG PO TABS: 650.0000 mg | ORAL_TABLET | Freq: Four times a day (QID) | ORAL | Status: DC | PRN

## 2012-05-23 MED ORDER — LISINOPRIL 5 MG PO TABS
5.0000 mg | ORAL_TABLET | Freq: Every day | ORAL | Status: DC
  Administered 2012-05-23: 5 mg via ORAL
  Filled 2012-05-23 (×3): qty 1

## 2012-05-23 MED ORDER — ONDANSETRON HCL 4 MG PO TABS: 4.0000 mg | ORAL_TABLET | Freq: Four times a day (QID) | ORAL | Status: DC | PRN

## 2012-05-23 MED ORDER — INSULIN GLARGINE 100 UNIT/ML ~~LOC~~ SOLN
45.0000 [IU] | Freq: Every day | SUBCUTANEOUS | Status: DC
  Filled 2012-05-23: qty 0.45

## 2012-05-23 MED ORDER — INSULIN ASPART 100 UNIT/ML ~~LOC~~ SOLN
14.0000 [IU] | Freq: Once | SUBCUTANEOUS | Status: AC
  Administered 2012-05-23: 14 [IU] via SUBCUTANEOUS

## 2012-05-23 MED ORDER — POTASSIUM CHLORIDE CRYS ER 20 MEQ PO TBCR
40.0000 meq | EXTENDED_RELEASE_TABLET | ORAL | Status: AC
  Administered 2012-05-23: 40 meq via ORAL
  Filled 2012-05-23: qty 2

## 2012-05-23 MED ORDER — HEPARIN BOLUS VIA INFUSION
4000.0000 [IU] | Freq: Once | INTRAVENOUS | Status: AC
  Administered 2012-05-23: 4000 [IU] via INTRAVENOUS

## 2012-05-23 MED ORDER — HEPARIN (PORCINE) IN NACL 100-0.45 UNIT/ML-% IJ SOLN: 12.0000 [IU]/kg/h | INTRAMUSCULAR | Status: DC

## 2012-05-23 MED ORDER — INSULIN ASPART 100 UNIT/ML ~~LOC~~ SOLN
8.0000 [IU] | Freq: Three times a day (TID) | SUBCUTANEOUS | Status: DC
  Administered 2012-05-23 – 2012-05-24 (×2): 8 [IU] via SUBCUTANEOUS

## 2012-05-23 MED ORDER — SODIUM CHLORIDE 0.9 % IJ SOLN: 3.0000 mL | Freq: Two times a day (BID) | INTRAMUSCULAR | Status: DC

## 2012-05-23 MED ORDER — ACETAMINOPHEN 650 MG RE SUPP: 650.0000 mg | Freq: Four times a day (QID) | RECTAL | Status: DC | PRN

## 2012-05-23 MED ORDER — RIVAROXABAN 20 MG PO TABS
20.0000 mg | ORAL_TABLET | Freq: Every day | ORAL | Status: DC
  Administered 2012-05-23: 20 mg via ORAL
  Filled 2012-05-23 (×2): qty 1

## 2012-05-23 NOTE — Plan of Care (Signed)
Problem: Food- and Nutrition-Related Knowledge Deficit (NB-1.1) Goal: Nutrition education Formal process to instruct or train a patient/client in a skill or to impart knowledge to help patients/clients voluntarily manage or modify food choices and eating behavior to maintain or improve health. Outcome: Completed/Met Date Met:  05/23/12  RD consulted for nutrition education regarding diabetes.     Lab Results  Component Value Date    HGBA1C 12.6* 05/23/2012    Patient reports that she has had minimal education since being diagnosed with diabetes. She says that she knows she needs to cut down on the amount of rice she is eating. Seems motivated to make changes in her diet. Very receptive and asked appropriate questions. Reviewed sources of CHO in her diet. Discussed portion sizes and ways to include protein with each meal.  RD provided "Carbohydrate Counting for People with Diabetes" handout from the Academy of [employer]etics. Discussed different food groups and their effects on blood sugar, emphasizing carbohydrate-containing foods. Provided list of carbohydrates and recommended serving sizes of common foods.  Discussed importance of controlled and consistent carbohydrate intake throughout the day. Provided examples of ways to balance meals/snacks and encouraged intake of high-fiber, whole grain complex carbohydrates. Teach back method used.  Expect fair compliance.  Body mass index is 28.87 kg/(m^2). Pt meets criteria for overweight based on current BMI.  Current diet order is Heart Healthy, patient is eating well at this time. Labs and medications reviewed. No further nutrition interventions warranted at this time. RD contact information provided. If additional nutrition issues arise, please re-consult RD.  Joaquin Courts, RD, LDN, CNSC Pager [phone] After Hours Pager [phone]

## 2012-05-23 NOTE — Progress Notes (Signed)
Utilization review completed.  

## 2012-05-23 NOTE — ED Notes (Signed)
Dr. Nicanor Alcon notified.

## 2012-05-23 NOTE — Progress Notes (Signed)
ANTICOAGULATION CONSULT NOTE - Follow Up Consult  Pharmacy Consult for Heparin Indication: atrial fibrillation  No Known Allergies  Patient Measurements: Height: 5\' 6"  (167.6 cm) Weight: 178 lb 12.7 oz (81.1 kg) IBW/kg (Calculated) : 59.3 Heparin Dosing Weight: 76 kg  Vital Signs: Temp: 98 F (36.7 C) (05/19 1129) Temp src: Oral (05/19 1129) BP: 122/92 mmHg (05/19 1113) Pulse Rate: 95 (05/19 1113)  Labs:  Recent Labs  05/23/12 0424 05/23/12 0825 05/23/12 1230  HGB 15.1*  --   --   HCT 44.1  --   --   PLT 283  --   --   HEPARINUNFRC  --   --  0.21*  CREATININE 0.81  --   --   TROPONINI  --  <0.30 <0.30    Estimated Creatinine Clearance: 98.1 ml/min (by C-G formula based on Cr of 0.81).   Medications:  Scheduled:  . aspirin EC  81 mg Oral Daily  . diltiazem  30 mg Oral Q6H  . insulin aspart  0-20 Units Subcutaneous TID WC  . insulin aspart  0-5 Units Subcutaneous QHS  . insulin aspart  4 Units Subcutaneous TID WC  . insulin glargine  45 Units Subcutaneous Daily  . lisinopril  5 mg Oral Daily  . sodium chloride  3 mL Intravenous Q12H  . sodium chloride  3 mL Intravenous Q12H   Infusions:  . diltiazem (CARDIZEM) infusion 10 mg/hr (05/23/12 1052)  . heparin 1,300 Units/hr (05/23/12 0556)    Assessment: 42 yo F with hx PAF presents to ED with CP and palpitations.  EKG confirmed afib with RVR.  Pt started on heparin drip this AM with first heparin level < goal.  Will increase gtt by 2 units/kg/hr.  Goal of Therapy:  Heparin level 0.3-0.7 units/ml Monitor platelets by anticoagulation protocol: Yes   Plan:  Increase heparin to 1450 units/hr. Heparin level at 1900 tonight. Continue daily heparin level and CBC. Follow-up start of oral anticoagulant.  [employer], Pharm.D., BCPS Clinical Pharmacist Pager [phone] 05/23/2012 2:41 PM

## 2012-05-23 NOTE — ED Notes (Signed)
Patient states chest pain starting yesterday, patient states she feels as if she is having irregular heart beats, patient denies vomiting/sob

## 2012-05-23 NOTE — Progress Notes (Addendum)
[facility] Progress Note [place] TEAM 1 - Stepdown/ICU TEAM   [name] NFA:[id number] DOB: 1970/07/30 DOA: 05/23/2012 PCP: Dorrene German, MD  Brief narrative: 42 y.o. female with known history of hypertension and diabetes mellitus type 2 who has had previous history of paroxysmal atrial fibrillation presented with complaints of chest pressure and palpitations. Patient stated her symptoms started 24hrs before her admission and continued through the day.  Since her symptoms did not improve she came to the ER. EKG revealed A. fib with RVR. Patient was started on Cardizem infusion after bolus. Patient's chest pressure resolved with control of heart rate. Denied any associated shortness of breath fever chills productive cough nausea vomiting diarrhea.   Assessment/Plan:  Atrial fibrillation with RVR CHADS2 Vasc score of 2 therefore anticoagulation was initiated - TSH/Free T4 normal - was seen for same July 2013 and received flecanide in ED, converted, and was sent home   Chest pressure  likely rate related/demand ischemia   HTN (hypertension)  Diabetes mellitus 2 - uncontrolled  A1c markedly elevated at 12.6  Code Status: FULL Family Communication: spoke w/ pt and husband at bedside Disposition Plan: SDU  Consultants: Cardiology - [place]  Procedures: TTE - pending   Antibiotics: none  DVT prophylaxis: Heparin   HPI/Subjective: F/U exam completed  Objective: Blood pressure 122/92, pulse 95, temperature 98 F (36.7 C), temperature source Oral, resp. rate 19, height 5\' 6"  (1.676 m), weight 81.1 kg (178 lb 12.7 oz), SpO2 98.00%.  Intake/Output Summary (Last 24 hours) at 05/23/12 1547 Last data filed at 05/23/12 0942  Gross per 24 hour  Intake     23 ml  Output    500 ml  Net   -477 ml   Exam: F/U exam completed  Data Reviewed: Basic Metabolic Panel:  Recent Labs Lab 05/23/12 0424  NA 131*  K 3.5  CL 92*  CO2 24  GLUCOSE 432*  BUN 13   CREATININE 0.81  CALCIUM 10.3   CBC:  Recent Labs Lab 05/23/12 0424  WBC 11.2*  HGB 15.1*  HCT 44.1  MCV 78.1  PLT 283   Cardiac Enzymes:  Recent Labs Lab 05/23/12 0825 05/23/12 1230  TROPONINI <0.30 <0.30   CBG:  Recent Labs Lab 05/23/12 0805 05/23/12 1223  GLUCAP 407* 289*    Recent Results (from the past 240 hour(s))  MRSA PCR SCREENING     Status: None   Collection Time    05/23/12  6:48 AM      Result Value Range Status   MRSA by PCR NEGATIVE  NEGATIVE Final   Comment:            The GeneXpert MRSA Assay (FDA     approved for NASAL specimens     only), is one component of a     comprehensive MRSA colonization     surveillance program. It is not     intended to diagnose MRSA     infection nor to guide or     monitor treatment for     MRSA infections.     Studies:  Recent x-ray studies have been reviewed in detail by the Attending Physician  Scheduled Meds:  Scheduled Meds: . aspirin EC  81 mg Oral Daily  . diltiazem  30 mg Oral Q6H  . insulin aspart  0-20 Units Subcutaneous TID WC  . insulin aspart  0-5 Units Subcutaneous QHS  . insulin aspart  4 Units Subcutaneous TID WC  . insulin glargine  45  Units Subcutaneous Daily  . lisinopril  5 mg Oral Daily  . sodium chloride  3 mL Intravenous Q12H  . sodium chloride  3 mL Intravenous Q12H   Continuous Infusions: . diltiazem (CARDIZEM) infusion 10 mg/hr (05/23/12 1052)  . heparin 1,450 Units/hr (05/23/12 1511)    Time spent on care of this patient: 25+mins   [employer] T  [facility] Office  [phone] Pager - Text Page per Loretha Stapler as per below:  On-Call/Text Page:      [email or web address]      password [email or web address]  If 7PM-7AM, please contact night-coverage [email or web address] Password [email or web address] 05/23/2012, 3:47 PM   LOS: 0 days

## 2012-05-23 NOTE — H&P (Signed)
[facility] History and Physical  [name] ZOX:[id number] DOB: 1970-12-03 DOA: 05/23/2012  Referring physician: Dr.Palumbo. PCP: Dorrene German, MD  Chief Complaint: Chest pressure and palpitations.  HPI: [name] is a 42 y.o. female with known history of hypertension and diabetes mellitus type 2 who has had previous history of paroxysmal atrial fibrillation and was instructed to follow with cardiologist which patient has not maintained presents with complaints of chest pressure and palpitations. Patient states her symptoms started yesterday morning which continued through the day and since her symptoms did not improve she came to the ER. EKG shows A. fib with RVR. Patient was started on Cardizem infusion after bolus and at this time has been admitted for further management. Patient's chest pressure this time is resolved with control of heart rate. Denies any associated shortness of breath fever chills productive cough nausea vomiting diarrhea.  Review of Systems: As presented in the history of presenting illness, rest negative.  Past Medical History  Diagnosis Date  . Diabetes mellitus   . Hypertension    Past Surgical History  Procedure Laterality Date  . Cesarean section    . Hernia repair     Social History:  reports that she has never smoked. She does not have any smokeless tobacco history on file. She reports that she does not drink alcohol or use illicit drugs. Lives at home. where does patient live-- Can do ADLs. Can patient participate in ADLs?  No Known Allergies  Family History  Problem Relation Age of Onset  . Family history unknown: Yes      Prior to Admission medications   Medication Sig Start Date End Date Taking? Authorizing Provider  insulin glargine (LANTUS) 100 UNIT/ML injection Inject 28 Units into the skin at bedtime.   Yes Historical Provider, MD  lisinopril (PRINIVIL,ZESTRIL) 5 MG tablet Take 5 mg by mouth daily.   Yes Historical  Provider, MD  metFORMIN (GLUCOPHAGE) 500 MG tablet Take 500 mg by mouth 2 (two) times daily with a meal.   Yes Historical Provider, MD   Physical Exam: Filed Vitals:   05/23/12 0445 05/23/12 0450 05/23/12 0515 05/23/12 0530  BP: 158/90  121/76 124/85  Pulse: 146  102 108  Temp:      TempSrc:      Resp: 20  21 19   Height:  5\' 6"  (1.676 m)    Weight:  98.431 kg (217 lb)    SpO2: 100%  97% 96%     General:  Well-developed well-nourished.  Eyes: Anicteric no pallor.  ENT: No discharge from the ears eyes nose mouth.  Neck: No mass felt.  Cardiovascular: S1-S2 heard.  Respiratory: No rhonchi or crepitations.  Abdomen: Soft nontender bowel sounds present.  Skin: No rash.  Musculoskeletal: No edema.  Psychiatric: Appears normal.  Neurologic: Alert oriented to time place and person. Moves all extremities.  Labs on Admission:  Basic Metabolic Panel:  Recent Labs Lab 05/23/12 0424  NA 131*  K 3.5  CL 92*  CO2 24  GLUCOSE 432*  BUN 13  CREATININE 0.81  CALCIUM 10.3   Liver Function Tests: No results found for this basename: AST, ALT, ALKPHOS, BILITOT, PROT, ALBUMIN,  in the last 168 hours No results found for this basename: LIPASE, AMYLASE,  in the last 168 hours No results found for this basename: AMMONIA,  in the last 168 hours CBC:  Recent Labs Lab 05/23/12 0424  WBC 11.2*  HGB 15.1*  HCT 44.1  MCV 78.1  PLT 283  Cardiac Enzymes: No results found for this basename: CKTOTAL, CKMB, CKMBINDEX, TROPONINI,  in the last 168 hours  BNP (last 3 results) No results found for this basename: PROBNP,  in the last 8760 hours CBG: No results found for this basename: GLUCAP,  in the last 168 hours  Radiological Exams on Admission: Dg Chest Portable 1 View  05/23/2012   *RADIOLOGY REPORT*  Clinical Data: Chest pain.  PORTABLE CHEST - 1 VIEW  Comparison: 07/20/2011.  Findings: The cardiac silhouette, mediastinal and hilar contours are normal and stable.  The  lungs are clear.  No pleural effusion. The bony thorax is intact.  IMPRESSION: No acute cardiopulmonary findings.   Original Report Authenticated By: Rudie Meyer, M.D.    EKG: Independently reviewed. Atrial fibrillation with RVR.  Assessment/Plan Principal Problem:   Atrial fibrillation with RVR Active Problems:   HTN (hypertension)   Diabetes mellitus   1. Atrial fibrillation with RVR - continue with Cardizem infusion. Cardizem by mouth as been added and once by mouth doses being taken to try to wean off Cardizem IV. Check thyroid function tests and 2-D echo and cycle cardiac markers. Since patient has CHADS2 Vasc score of 2 patient will be a candidate for anticoagulation and at this time patient has been started on heparin IV which may be changed to oral anticoagulants. 2. Chest pressure - this is improved with control of heart rate. Cycle cardiac markers check 2-D echo. 3. Diabetes mellitus type II uncontrolled - patient has not taken her last nights dose of Lantus. Patient states recently her PCP last week and increase her Lantus to 45 units subcutaneous. At this time I have written one dose for now and daily in a.m. Closely follow CBG. Check hemoglobin A1c. Dietitian consult requested. 4. Hypertension - continue home medications.  Pregnancy screen is pending.    Code Status: Full code.  Family Communication: None.  Disposition Plan: Admit to inpatient.    [name] N. [facility] Pager [phone].  If 7PM-7AM, please contact night-coverage [email or web address] Password [facility] 05/23/2012, 5:57 AM

## 2012-05-23 NOTE — ED Notes (Addendum)
Pt alert, confused, slow to respond, staring, not following commands, needing multiple prompts, skin cool and clammy, fast irregular pulse, sitting in w/c, possible language barrier, back to room in w/c for triage and EKG, report given to EMT & RN, EKG & protocols initiated, care transferred.

## 2012-05-23 NOTE — Progress Notes (Signed)
ANTICOAGULATION CONSULT NOTE - Initial Consult  Pharmacy Consult for Heparin   Indication: atrial fibrillation  No Known Allergies  Patient Measurements: Height: 5\' 6"  (167.6 cm) Weight: 217 lb (98.431 kg) IBW/kg (Calculated) : 59.3 Heparin Dosing Weight: 80 kg   Vital Signs: Temp: 98.5 F (36.9 C) (05/19 0424) Temp src: Oral (05/19 0424) BP: 158/90 mmHg (05/19 0445) Pulse Rate: 146 (05/19 0445)  Labs:  Recent Labs  05/23/12 0424  HGB 15.1*  HCT 44.1  PLT 283  CREATININE 0.81    Estimated Creatinine Clearance: 108.1 ml/min (by C-G formula based on Cr of 0.81).   Medical History: Past Medical History  Diagnosis Date  . Diabetes mellitus   . Hypertension     Medications:  Zestril  Lantus  Metformin  Assessment: 42 yo female with chest pain/Afib for heparin  Goal of Therapy:  Heparin level 0.3-0.7 units/ml Monitor platelets by anticoagulation protocol: Yes   Plan:  Heparin 4000 units IV bolus, then 1300 units/hr Check heparin level in 6 hours.  Eddie Candle 05/23/2012,5:23 AM

## 2012-05-23 NOTE — Consult Note (Signed)
CARDIOLOGY CONSULT NOTE  Patient ID: [name] MRN: [id number], DOB/AGE: 42-Jul-1972   Admit date: 05/23/2012 Date of Consult: 05/23/2012  Primary Physician: Dorrene German, MD Primary Cardiologist: New - seen by P. Eden Emms, MD  Pt. Profile  42 y/o female with h/o PAF who presents with recurrent afib.  Problem List  Past Medical History  Diagnosis Date  . Diabetes mellitus     a. Gestational Diabetes with both children;  b. 05/2012 A1c 12.6  . Hypertension   . PAF (paroxysmal atrial fibrillation)     a. 07/2011 ED visit with AF-> converted with flecainide, never followed-up;  b. 05/2012 Recurrent Afib, c. Echo: EF 65-60%, mildly dil LA.    Past Surgical History  Procedure Laterality Date  . Cesarean section    . Hernia repair       Allergies  No Known Allergies  HPI   42 y/o female with h/o HTN and gestational diabetes, which she thought had resolved. She also has a h/o PAF dating back to 07/2011 @ which time she presented to the ED, received Flecainide 300mg  x 1, converted to sinus, was discharged, and then never followed up with cardiology as instructed.  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  Outside of an episode of tachypalps in January of this year, that lasted several hrs and resolved spontaneously, she has otherwise not been bothered by palpitations/recurrent afib.  Last night around 5PM, she noted mild, sharp chest discomfort.  She didn't think anything of it and went along with her usual evening routines.  She went to bed at a usual time and awoke suddenly @ 2 AM with tachypalpitations.  She called a friend who took her into the ED.  There, she was found to be in afib with RVR and rates into the 130's.  She was placed on IV heparin and diltiazem with some improvement in rate control and symptoms.  She was admitted to stepdown where she remains in rate-controlled afib.  CE have been negative and  ECG non-acute.  We have been asked to eval.  She currently has no complaints.  A1c returned 12.6.  Inpatient Medications  . aspirin EC  81 mg Oral Daily  . diltiazem  60 mg Oral Q6H  . insulin aspart  0-20 Units Subcutaneous TID WC  . insulin aspart  0-5 Units Subcutaneous QHS  . insulin aspart  8 Units Subcutaneous TID WC  . [START ON 05/24/2012] insulin glargine  45 Units Subcutaneous QHS  . lisinopril  5 mg Oral Daily  . sodium chloride  3 mL Intravenous Q12H   Family History Family History  Problem Relation Age of Onset  . Other Father     killed in [place] civil war when he was in his 59's  . Asthma Mother     alive in her 43's  . Other Sister     A & W  . Other Sister     A & W    Social History History   Social History  . Marital Status: Married    Spouse Name: N/A    Number of Children: N/A  . Years of Education: N/A   Occupational History  . Not on file.   Social History Main Topics  . Smoking status: Never Smoker   . Smokeless tobacco: Not on file  . Alcohol Use: No  . Drug Use: No  . Sexually Active: Not  on file   Other Topics Concern  . Not on file   Social History Narrative   Lives in [place] with significant other.  Works @ [employer] in [employer].  Does not routinely exercise.    Review of Systems  General:  No chills, fever, night sweats or weight changes.  Cardiovascular:  +++tachypalps and chest pain as outlined above.  No dyspnea on exertion, edema, orthopnea, paroxysmal nocturnal dyspnea. Dermatological: No rash, lesions/masses Respiratory: No cough, dyspnea Urologic: No hematuria, dysuria Abdominal:   No nausea, vomiting, diarrhea, bright red blood per rectum, melena, or hematemesis Neurologic:  No visual changes, wkns, changes in mental status. All other systems reviewed and are otherwise negative except as noted above.  Physical Exam  Blood pressure 110/80, pulse 86, temperature 98 F (36.7 C), temperature source Oral, resp.  rate 19, height 5\' 6"  (1.676 m), weight 178 lb 12.7 oz (81.1 kg), SpO2 96.00%.  General: Pleasant, NAD Psych: Normal affect. Neuro: Alert and oriented X 3. Moves all extremities spontaneously. HEENT: Normal  Neck: Supple without bruits or JVD. Lungs:  Resp regular and unlabored, CTA. Heart: IR, IR, no s3, s4, or murmurs. Abdomen: Soft, non-tender, non-distended, BS + x 4.  Extremities: No clubbing, cyanosis or edema. DP/PT/Radials 2+ and equal bilaterally.  Labs   Recent Labs  05/23/12 0825 05/23/12 1230  TROPONINI <0.30 <0.30   Lab Results  Component Value Date   WBC 11.2* 05/23/2012   HGB 15.1* 05/23/2012   HCT 44.1 05/23/2012   MCV 78.1 05/23/2012   PLT 283 05/23/2012    Recent Labs Lab 05/23/12 0424  NA 131*  K 3.5  CL 92*  CO2 24  BUN 13  CREATININE 0.81  CALCIUM 10.3  GLUCOSE 432*   Lab Results  Component Value Date   HGBA1C 12.6* 05/23/2012   Radiology/Studies  Dg Chest Portable 1 View  05/23/2012   *RADIOLOGY REPORT*  Clinical Data: Chest pain.  PORTABLE CHEST - 1 VIEW  Comparison: 07/20/2011.  Findings: The cardiac silhouette, mediastinal and hilar contours are normal and stable.  The lungs are clear.  No pleural effusion. The bony thorax is intact.  IMPRESSION: No acute cardiopulmonary findings.   Original Report Authenticated By: Rudie Meyer, M.D.   2D Echocardiogram 5.19.2014  Study Conclusions  - Left ventricle: There was mild focal basal hypertrophy of   the septum. Systolic function was vigorous. The estimated   ejection fraction was in the range of 65% to 70%. - Left atrium: The atrium was mildly dilated _____________  ECG  afib 134, no acute st/t changes.  ASSESSMENT AND PLAN  1.  PAF:  Pt with recurrent afib.  Third occasion in under a year.  CHA2DS2VASc= 3 (HTN, DM, Female).  No prior h/o CAD.  CE negative without obj evidence of ischemia.  Echo showed nl EF w/o WMA or significant valvular dzs.  Onset of Ss approximately 15 hrs.  Supp K+.   She was successfully treated with flecainide 300mg  in July of 2013.  We will use this again, with a plan to put her on regularly scheduled flecainide going forward.  That said, she will require ETT in the near future as an outpt to r/o ventricular arrhythmias on flecainide.  With elevated stroke risk, will d/c heparin and initiate xarelto 20mg  daily.  Compliance will be important.  2.  HTN:  Currently stable.  3.  DM:  Poorly controlled.  Newly Dx as far as the pt is concerned.  Per IM.  Signed,  Nicolasa Ducking, NP 05/23/2012, 4:46 PM   Patient examined chart reviewed.  Recurrent PAF with no medical compliance.  No history of structural heart disease. Clinically clear onset yesterday.  On heparin Had successful chemical cardioversion with flecainide before and within 48 hour time frame for pill in pocket type Rx.  Supplement K Needs medical f/u for DM and primary care.  Discussed risk of stroke with afib and conversion with patient. She is willing to be Rx and have flecainide.  Charlton Haws

## 2012-05-23 NOTE — Progress Notes (Signed)
*  PRELIMINARY RESULTS* Echocardiogram 2D Echocardiogram has been performed.  [name] 05/23/2012, 9:37 AM

## 2012-05-23 NOTE — ED Provider Notes (Signed)
History     CSN: [id number]  Arrival date & time 05/23/12  0415   First MD Initiated Contact with Patient 05/23/12 [phone]      Chief Complaint  Patient presents with  . Chest Pain    (Consider location/radiation/quality/duration/timing/severity/associated sxs/prior treatment) Patient is a 42 y.o. female presenting with chest pain. The history is provided by the patient. No language interpreter was used.  Chest Pain Pain location:  Substernal area Pain quality: pressure   Pain radiates to:  Does not radiate Pain severity:  Moderate Onset quality:  Gradual Duration:  2 days Timing:  Constant Progression:  Unchanged Chronicity:  Recurrent Context: no trauma   Relieved by:  Nothing Worsened by:  Nothing tried Ineffective treatments:  None tried Associated symptoms: palpitations   Associated symptoms: no cough, no fever, no syncope and not vomiting   Not taking BP meds at home and sugars running high  Past Medical History  Diagnosis Date  . Diabetes mellitus   . Hypertension     History reviewed. No pertinent past surgical history.  No family history on file.  History  Substance Use Topics  . Smoking status: Never Smoker   . Smokeless tobacco: Not on file  . Alcohol Use: No    OB History   Grav Para Term Preterm Abortions TAB SAB Ect Mult Living                  Review of Systems  Constitutional: Negative for fever.  Respiratory: Negative for cough.   Cardiovascular: Positive for chest pain and palpitations. Negative for syncope.  Gastrointestinal: Negative for vomiting.  All other systems reviewed and are negative.    Allergies  Review of patient's allergies indicates no known allergies.  Home Medications   Current Outpatient Rx  Name  Route  Sig  Dispense  Refill  . insulin glargine (LANTUS) 100 UNIT/ML injection   Subcutaneous   Inject 28 Units into the skin at bedtime.         Marland Kitchen lisinopril (PRINIVIL,ZESTRIL) 5 MG tablet   Oral   Take 5 mg  by mouth daily.         . metFORMIN (GLUCOPHAGE) 500 MG tablet   Oral   Take 500 mg by mouth 2 (two) times daily with a meal.           BP 158/90  Pulse 146  Temp(Src) 98.5 F (36.9 C) (Oral)  Resp 20  Ht 5\' 6"  (1.676 m)  Wt 217 lb (98.431 kg)  BMI 35.04 kg/m2  SpO2 100%  Physical Exam  Constitutional: She is oriented to person, place, and time. She appears well-developed and well-nourished.  HENT:  Head: Normocephalic.  Mouth/Throat: Oropharynx is clear and moist.  Eyes: Conjunctivae are normal. Pupils are equal, round, and reactive to light.  Neck: Normal range of motion. Neck supple.  Cardiovascular: Intact distal pulses.  An irregularly irregular rhythm present. Tachycardia present.   Pulmonary/Chest: Effort normal and breath sounds normal. She has no wheezes. She has no rales.  Abdominal: Soft. Bowel sounds are normal. There is no tenderness. There is no rebound and no guarding.  Musculoskeletal: Normal range of motion. She exhibits no edema and no tenderness.  Neurological: She is alert and oriented to person, place, and time. She has normal reflexes.  Skin: Skin is warm and dry. She is not diaphoretic.    ED Course  Procedures (including critical care time)  Labs Reviewed  CBC - Abnormal; Notable for the following:  WBC 11.2 (*)    RBC 5.65 (*)    Hemoglobin 15.1 (*)    All other components within normal limits  BASIC METABOLIC PANEL - Abnormal; Notable for the following:    Sodium 131 (*)    Chloride 92 (*)    Glucose, Bld 432 (*)    GFR calc non Af Amer 89 (*)    All other components within normal limits  URINALYSIS, ROUTINE W REFLEX MICROSCOPIC  POCT I-STAT TROPONIN I   Dg Chest Portable 1 View  05/23/2012   *RADIOLOGY REPORT*  Clinical Data: Chest pain.  PORTABLE CHEST - 1 VIEW  Comparison: 07/20/2011.  Findings: The cardiac silhouette, mediastinal and hilar contours are normal and stable.  The lungs are clear.  No pleural effusion. The bony thorax  is intact.  IMPRESSION: No acute cardiopulmonary findings.   Original Report Authenticated By: Rudie Meyer, M.D.     1. Atrial fibrillation with rapid ventricular response   2. Hyperglycemia       MDM  Reviewed and appreciate nursing note however patient is denying headache and is awake alert and oriented on EDP exam as witnessed by patient's nurse.  Excepting the fact that she does not know the name of her BP med she has not taken in 2 days she answers all questions appropriately and in a timely fashion    Date: 05/23/2012  Rate: 158  Rhythm: atrial fibrillation  QRS Axis: normal  Intervals: normal  ST/T Wave abnormalities: ST depressions diffusely  Conduction Disutrbances:none  Narrative Interpretation:   Old EKG Reviewed: changes noted  Post cardizem bolus   Date: 05/23/2012  Rate: 104  Rhythm: atrial fibrillation  QRS Axis: normal  Intervals: normal  ST/T Wave abnormalities: ST depressions inferiorly  Conduction Disutrbances:none  Narrative Interpretation:   Old EKG Reviewed: changes noted    Results for orders placed during the hospital encounter of 05/23/12  CBC      Result Value Range   WBC 11.2 (*) 4.0 - 10.5 K/uL   RBC 5.65 (*) 3.87 - 5.11 MIL/uL   Hemoglobin 15.1 (*) 12.0 - 15.0 g/dL   HCT 45.4  09.8 - 11.9 %   MCV 78.1  78.0 - 100.0 fL   MCH 26.7  26.0 - 34.0 pg   MCHC 34.2  30.0 - 36.0 g/dL   RDW 14.7  82.9 - 56.2 %   Platelets 283  150 - 400 K/uL  BASIC METABOLIC PANEL      Result Value Range   Sodium 131 (*) 135 - 145 mEq/L   Potassium 3.5  3.5 - 5.1 mEq/L   Chloride 92 (*) 96 - 112 mEq/L   CO2 24  19 - 32 mEq/L   Glucose, Bld 432 (*) 70 - 99 mg/dL   BUN 13  6 - 23 mg/dL   Creatinine, Ser 1.30  0.50 - 1.10 mg/dL   Calcium 86.5  8.4 - 78.4 mg/dL   GFR calc non Af Amer 89 (*) >90 mL/min   GFR calc Af Amer >90  >90 mL/min  URINALYSIS, ROUTINE W REFLEX MICROSCOPIC      Result Value Range   Color, Urine YELLOW  YELLOW   APPearance CLOUDY (*)  CLEAR   Specific Gravity, Urine 1.018  1.005 - 1.030   pH 6.5  5.0 - 8.0   Glucose, UA >1000 (*) NEGATIVE mg/dL   Hgb urine dipstick SMALL (*) NEGATIVE   Bilirubin Urine NEGATIVE  NEGATIVE   Ketones, ur NEGATIVE  NEGATIVE  mg/dL   Protein, ur 191 (*) NEGATIVE mg/dL   Urobilinogen, UA 0.2  0.0 - 1.0 mg/dL   Nitrite NEGATIVE  NEGATIVE   Leukocytes, UA NEGATIVE  NEGATIVE  URINE MICROSCOPIC-ADD ON      Result Value Range   Squamous Epithelial / LPF MANY (*) RARE   RBC / HPF 3-6  <3 RBC/hpf   Bacteria, UA FEW (*) RARE   Urine-Other RARE YEAST    POCT I-STAT TROPONIN I      Result Value Range   Troponin i, poc 0.01  0.00 - 0.08 ng/mL   Comment 3            Dg Chest Portable 1 View  05/23/2012   *RADIOLOGY REPORT*  Clinical Data: Chest pain.  PORTABLE CHEST - 1 VIEW  Comparison: 07/20/2011.  Findings: The cardiac silhouette, mediastinal and hilar contours are normal and stable.  The lungs are clear.  No pleural effusion. The bony thorax is intact.  IMPRESSION: No acute cardiopulmonary findings.   Original Report Authenticated By: Rudie Meyer, M.D.    MDM Reviewed: previous chart, vitals and nursing note Reviewed previous: ECG and labs Interpretation: labs, ECG and x-ray Total time providing critical care: 30-74 minutes. This excludes time spent performing separately reportable procedures and services. Consults: admitting MD    Medications  sodium chloride 0.9 % bolus 1,000 mL (not administered)  heparin bolus via infusion 4,000 Units (not administered)  heparin ADULT infusion 100 units/mL (25000 units/250 mL) (not administered)  aspirin chewable tablet 324 mg (324 mg Oral Given 05/23/12 0454)  diltiazem (CARDIZEM) 100 mg in dextrose 5 % 100 mL infusion (10 mg/hr Intravenous New Bag/Given 05/23/12 0454)     CRITICAL CARE Performed by: Jasmine Awe Total critical care time: 60 minutes Critical care time was exclusive of separately billable procedures and treating other  patients. Critical care was necessary to treat or prevent imminent or life-threatening deterioration. Critical care was time spent personally by me on the following activities: development of treatment plan with patient and/or surrogate as well as nursing, discussions with consultants, evaluation of patient's response to treatment, examination of patient, obtaining history from patient or surrogate, ordering and performing treatments and interventions, ordering and review of laboratory studies, ordering and review of radiographic studies, pulse oximetry and re-evaluation of patient's condition.     Jasmine Awe, MD 05/23/12 [phone]

## 2012-05-23 NOTE — ED Notes (Signed)
Attempted report, nurse to call back

## 2012-05-23 NOTE — ED Notes (Signed)
Bolus of 20 mg Cardizem  given from bag administered through IV pump, unable to get MAR to recognize.  Order from Haynes, MD

## 2012-05-24 LAB — BASIC METABOLIC PANEL
CO2: 26 mEq/L (ref 19–32)
Calcium: 9.4 mg/dL (ref 8.4–10.5)
GFR calc non Af Amer: >90 mL/min (ref >90)
Potassium: 3.9 mEq/L (ref 3.5–5.1)
Sodium: 137 mEq/L (ref 135–145)

## 2012-05-24 LAB — CBC
Hemoglobin: 13.4 g/dL (ref 12.0–15.0)
MCV: 77.8 fL — ABNORMAL LOW (ref 78.0–100.0)
Platelets: 265 10*3/uL (ref 150–400)
RBC: 5.1 MIL/uL (ref 3.87–5.11)
WBC: 9.8 10*3/uL (ref 4.0–10.5)

## 2012-05-24 LAB — GLUCOSE, CAPILLARY: Glucose-Capillary: 313 mg/dL — ABNORMAL HIGH (ref 70–99)

## 2012-05-24 LAB — URINE CULTURE: Colony Count: 35000

## 2012-05-24 MED ORDER — INSULIN GLARGINE 100 UNIT/ML ~~LOC~~ SOLN: 55.0000 [IU] | Freq: Every day | SUBCUTANEOUS | Status: DC

## 2012-05-24 MED ORDER — RIVAROXABAN 20 MG PO TABS: 20.0000 mg | ORAL_TABLET | Freq: Every day | ORAL | Status: DC

## 2012-05-24 MED ORDER — INSULIN ASPART 100 UNIT/ML ~~LOC~~ SOLN: 8.0000 [IU] | Freq: Three times a day (TID) | SUBCUTANEOUS | Status: DC

## 2012-05-24 MED ORDER — FLECAINIDE ACETATE 50 MG PO TABS: 50.0000 mg | ORAL_TABLET | Freq: Two times a day (BID) | ORAL | Status: DC

## 2012-05-24 MED ORDER — ASPIRIN 81 MG PO TBEC: 81.0000 mg | DELAYED_RELEASE_TABLET | Freq: Every day | ORAL | Status: DC

## 2012-05-24 NOTE — Discharge Summary (Signed)
Physician Discharge Summary  Cataleah Stites ZOX:[id number] DOB: Jun 10, 1970 DOA: 05/23/2012  PCP: Dorrene German, MD  Admit date: 05/23/2012 Discharge date: 05/24/2012  Time spent: >45 minutes  Recommendations for Outpatient Follow-up:  1. F/u CBGs  Discharge Diagnoses:  Principal Problem:   Atrial fibrillation with RVR Active Problems:   HTN (hypertension)   Diabetes mellitus   Discharge Condition: stable  Diet recommendation: heart healthy, diabetic diet  Filed Weights   05/23/12 0450 05/23/12 0640 05/24/12 0500  Weight: 98.431 kg (217 lb) 81.1 kg (178 lb 12.7 oz) 82 kg (180 lb 12.4 oz)    History of present illness:  [name] is a 42 y.o. female with known history of hypertension and diabetes mellitus type 2 who has had previous history of paroxysmal atrial fibrillation and was instructed to follow with a cardiologist. However, the patient did not follow up as instructed. She presents with complaints of chest pressure and palpitations. Patient states her symptoms started yesterday morning which continued through the day and since her symptoms did not improve she came to the ER. EKG shows A. fib with RVR. Patient was started on Cardizem infusion after bolus and at this time has been admitted for further management. Patient's chest pressure resolved with control of heart rate.   Hospital Course:  Atrial fibrillation with RVR  CHADS2 Vasc score of 3 therefore anticoagulation was initiated  - TSH/Free T4 normal  - was seen for same July 2013 and received flecanide in ED, converted, and was sent home  - She will be discharged on Flecainide and Xarelto - She is agreeable to taking Xarelto after my discussion today.   Chest pressure  likely rate related/demand ischemia   HTN (hypertension)   Diabetes mellitus 2 - uncontrolled  A1c markedly elevated at 12.6- have increased Lantus to 55 u.  -We have added Novolog pre-meal.  -She was given 45 here and AM glucose was noted  to still be quite elevated at 313. She has been given further instruction on how to titrate Lantus up and has a referral for the Diabetes outpt. teaching.  -I have also given her the number for [facility] for further management.     Consultations:  [facility]  Discharge Exam: Filed Vitals:   05/23/12 2342 05/24/12 0340 05/24/12 0500 05/24/12 0746  BP:  96/67  92/61  Pulse:  78    Temp: 98.2 F (36.8 C) 98.7 F (37.1 C)  97.9 F (36.6 C)  TempSrc:  Oral  Oral  Resp:  11    Height:      Weight:   82 kg (180 lb 12.4 oz)   SpO2:  97%      General: AAO x 3, no acute distress Cardiovascular: RRR, no murmurs Respiratory: CTA b/l   Discharge Instructions      Discharge Orders   Future Appointments Provider Department Dept Phone   07/07/2012 3:30 PM Wendall Stade, MD [facility] Main Office [place]) [phone]   Future Orders Complete By Expires     Ambulatory referral to Nutrition and Diabetic Education  As directed     Comments:      Works for [employer].  A1c 12.6% (05/23/12).  Taking Lantus and Metformin at home prior to admission.    Diet - low sodium heart healthy  As directed     Comments:      Diabetic diet    Discharge instructions  As directed     Comments:      Take  Novolog with each meal- if your only eat half your meal, take half- if you do not eat, do not take it.  Increase Lantus by 2-4 units every 2 days until morning sugars (before your eat breakfast) are down to 100. Make sure to see Diabetes coordinator for more teaching.    Increase activity slowly  As directed         Medication List    TAKE these medications       flecainide 50 MG tablet  Commonly known as:  TAMBOCOR  Take 1 tablet (50 mg total) by mouth 2 (two) times daily.     insulin aspart 100 UNIT/ML injection  Commonly known as:  novoLOG  Inject 8 Units into the skin 3 (three) times daily with meals.     insulin glargine 100 UNIT/ML injection  Commonly known  as:  LANTUS  Inject 0.55 mLs (55 Units total) into the skin at bedtime.     lisinopril 5 MG tablet  Commonly known as:  PRINIVIL,ZESTRIL  Take 5 mg by mouth daily.     metFORMIN 500 MG tablet  Commonly known as:  GLUCOPHAGE  Take 500 mg by mouth 2 (two) times daily with a meal.     Rivaroxaban 20 MG Tabs  Commonly known as:  XARELTO  Take 1 tablet (20 mg total) by mouth daily with supper.       No Known Allergies Follow-up Information   Follow up with Charlton Haws, MD On 07/07/2012. (at 3:30 pm)    Contact information:   [address] [place] [address] [phone]       Follow up with Dorrene German, MD In 2 weeks.   Contact information:   [address] [place] [address] [phone]       Follow up with [facility]. Schedule an appointment as soon as possible for a visit in 2 weeks.   Contact information:   [address] [place] [address] [phone]       The results of significant diagnostics from this hospitalization (including imaging, microbiology, ancillary and laboratory) are listed below for reference.    Significant Diagnostic Studies: Dg Chest Portable 1 View  05/23/2012   *RADIOLOGY REPORT*  Clinical Data: Chest pain.  PORTABLE CHEST - 1 VIEW  Comparison: 07/20/2011.  Findings: The cardiac silhouette, mediastinal and hilar contours are normal and stable.  The lungs are clear.  No pleural effusion. The bony thorax is intact.  IMPRESSION: No acute cardiopulmonary findings.   Original Report Authenticated By: Rudie Meyer, M.D.    Microbiology: Recent Results (from the past 240 hour(s))  URINE CULTURE     Status: None   Collection Time    05/23/12  6:47 AM      Result Value Range Status   Specimen Description URINE, CLEAN CATCH   Final   Special Requests NONE   Final   Culture  Setup Time 05/23/2012 13:43   Final   Colony Count 35,000 COLONIES/ML   Final   Culture      Final   Value: Multiple bacterial morphotypes present, none predominant. Suggest appropriate recollection if clinically indicated.   Report Status 05/24/2012 FINAL   Final  MRSA PCR SCREENING     Status: None   Collection Time    05/23/12  6:48 AM      Result Value Range Status   MRSA by PCR NEGATIVE  NEGATIVE Final   Comment:  The GeneXpert MRSA Assay (FDA     approved for NASAL specimens     only), is one component of a     comprehensive MRSA colonization     surveillance program. It is not     intended to diagnose MRSA     infection nor to guide or     monitor treatment for     MRSA infections.     Labs: Basic Metabolic Panel:  Recent Labs Lab 05/23/12 0424 05/24/12 0410  NA 131* 137  K 3.5 3.9  CL 92* 99  CO2 24 26  GLUCOSE 432* 271*  BUN 13 12  CREATININE 0.81 0.77  CALCIUM 10.3 9.4   Liver Function Tests: No results found for this basename: AST, ALT, ALKPHOS, BILITOT, PROT, ALBUMIN,  in the last 168 hours No results found for this basename: LIPASE, AMYLASE,  in the last 168 hours No results found for this basename: AMMONIA,  in the last 168 hours CBC:  Recent Labs Lab 05/23/12 0424 05/24/12 0410  WBC 11.2* 9.8  HGB 15.1* 13.4  HCT 44.1 39.7  MCV 78.1 77.8*  PLT 283 265   Cardiac Enzymes:  Recent Labs Lab 05/23/12 0825 05/23/12 1230 05/23/12 1915  TROPONINI <0.30 <0.30 <0.30   BNP: BNP (last 3 results) No results found for this basename: PROBNP,  in the last 8760 hours CBG:  Recent Labs Lab 05/23/12 0805 05/23/12 1223 05/23/12 1624 05/23/12 2131 05/24/12 0758  GLUCAP 407* 289* 206* 296* 313*       Signed:  Jannat Rosemeyer  [facility] 05/24/2012, 12:03 PM

## 2012-05-24 NOTE — Progress Notes (Signed)
Patient ID: [name], female   DOB: 1970-10-03, 42 y.o.   MRN: [id number]    Subjective:  Denies SSCP, palpitations or Dyspnea   Objective:  Filed Vitals:   05/23/12 2342 05/24/12 0340 05/24/12 0500 05/24/12 0746  BP:  96/67  92/61  Pulse:  78    Temp: 98.2 F (36.8 C) 98.7 F (37.1 C)  97.9 F (36.6 C)  TempSrc:  Oral  Oral  Resp:  11    Height:      Weight:   180 lb 12.4 oz (82 kg)   SpO2:  97%      Intake/Output from previous day:  Intake/Output Summary (Last 24 hours) at 05/24/12 0817 Last data filed at 05/23/12 1800  Gross per 24 hour  Intake 274.53 ml  Output    500 ml  Net -225.47 ml    Physical Exam: Affect appropriate Healthy:  appears stated age HEENT: normal Neck supple with no adenopathy JVP normal no bruits no thyromegaly Lungs clear with no wheezing and good diaphragmatic motion Heart:  S1/S2 no murmur, no rub, gallop or click PMI normal Abdomen: benighn, BS positve, no tenderness, no AAA no bruit.  No HSM or HJR Distal pulses intact with no bruits No edema Neuro non-focal Skin warm and dry No muscular weakness   Lab Results: Basic Metabolic Panel:  Recent Labs  30/86/57 0424 05/24/12 0410  NA 131* 137  K 3.5 3.9  CL 92* 99  CO2 24 26  GLUCOSE 432* 271*  BUN 13 12  CREATININE 0.81 0.77  CALCIUM 10.3 9.4     Recent Labs  05/23/12 0424 05/24/12 0410  WBC 11.2* 9.8  HGB 15.1* 13.4  HCT 44.1 39.7  MCV 78.1 77.8*  PLT 283 265   Cardiac Enzymes:  Recent Labs  05/23/12 0825 05/23/12 1230 05/23/12 1915  TROPONINI <0.30 <0.30 <0.30   Hemoglobin A1C:  Recent Labs  05/23/12 0825  HGBA1C 12.6*   Fasting Lipid Panel:  Recent Labs  05/24/12 0410  CHOL 100  HDL 47  LDLCALC 32  TRIG 104  CHOLHDL 2.1   Thyroid Function Tests:  Recent Labs  05/23/12 0825  TSH 0.930    Imaging: Dg Chest Portable 1 View  05/23/2012   *RADIOLOGY REPORT*  Clinical Data: Chest pain.  PORTABLE CHEST - 1 VIEW  Comparison:  07/20/2011.  Findings: The cardiac silhouette, mediastinal and hilar contours are normal and stable.  The lungs are clear.  No pleural effusion. The bony thorax is intact.  IMPRESSION: No acute cardiopulmonary findings.   Original Report Authenticated By: Rudie Meyer, M.D.    Cardiac Studies:  ECG:  NSR normal   Telemetry: NSR afib resolved  Echo: Normal EF 65-70%   Medications:   . aspirin EC  81 mg Oral Daily  . diltiazem  60 mg Oral Q6H  . insulin aspart  0-20 Units Subcutaneous TID WC  . insulin aspart  0-5 Units Subcutaneous QHS  . insulin aspart  8 Units Subcutaneous TID WC  . insulin glargine  45 Units Subcutaneous QHS  . lisinopril  5 mg Oral Daily  . rivaroxaban  20 mg Oral Q supper  . sodium chloride  3 mL Intravenous Q12H       Assessment/Plan:  PAF:  Resolved for second time with pill in pocket flecainide.  Discussed issues of anticoagulation and patient Prefers to go home on felcainide  50 bid and aspirin with no anticoagulation. I think this is reasonable  Discharge Home f/u  with me in 6-8 weeks  Charlton Haws 05/24/2012, 8:17 AM

## 2012-05-24 NOTE — Progress Notes (Signed)
Pt discharge instructions given, copies of form and scripts given, PIV removed-tip intact, VSS and pt states will take medicine once home.

## 2012-05-26 ENCOUNTER — Telehealth: Payer: Self-pay | Admitting: *Deleted

## 2012-05-26 NOTE — Telephone Encounter (Signed)
Patient contacted regarding discharge from hospital on May 19,2014.  Patient understands to follow up with provider Dr. Eden Emms on July 3,2014 at 3:30pm at [facility]. Office. Patient understands discharge instructions? yes Patient understands medications and regiment? yes Patient understands to bring all medications to this visit? yes  Mylo Red RN

## 2012-06-30 ENCOUNTER — Encounter: Payer: 59 | Attending: Internal Medicine | Admitting: *Deleted

## 2012-06-30 ENCOUNTER — Encounter: Payer: Self-pay | Admitting: *Deleted

## 2012-06-30 VITALS — Ht 66.0 in | Wt 184.9 lb

## 2012-06-30 DIAGNOSIS — E119 Type 2 diabetes mellitus without complications: Secondary | ICD-10-CM | POA: Insufficient documentation

## 2012-06-30 DIAGNOSIS — Z713 Dietary counseling and surveillance: Secondary | ICD-10-CM | POA: Insufficient documentation

## 2012-06-30 NOTE — Patient Instructions (Signed)
Plan:  Consider checking BG at least once every day, and alternate times per day including before each meal or at bedtime  Consider taking Novolog insulin before each meal  Consider taking Lantus insulin at 9 PM each night.  (If you are late taking the Lantus it is better to go ahead and take it rather than skipping it.) Consider rotating your injection sites around your stomach, outer thighs and upper buttocks areas.

## 2012-06-30 NOTE — Progress Notes (Signed)
  Medical Nutrition Therapy:  Appt start time: 1115 end time:  1215.  Assessment:  Primary concerns today: patient here for diabetes education. States history of GDM and diagnosis of Type 2 last year. She lives with 31 and 42 year old children. 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 Has a meter but not sure how to use it. 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 States she enjoys watching tv with kids, goes to park occasionally.  MEDICATIONS: see list, diabetes medications include Novolog, Lantus and Metformin   DIETARY INTAKE:  Usual eating pattern includes 3 meals and 3 snacks per day.  Everyday foods include fair variety of all food groups.  Avoided foods include most high calorie foods.    24-hr recall:  B ( AM): oatmeal OR cereal OR eggs and bacon at work, coffee with Splenda  Snk ( AM): crackers with PNB  L ( PM): buys from cafe: salad OR hot meal of meat, starch and vegetables, water Snk ( PM): fresh fruit D ( PM): cooks at home most nights- chicken, starch, vegetables, water Snk ( PM): 2 scoops of ice cream occasionally Beverages: water, coffee, regular Sprite on occasion  Usual physical activity: active with work as Advertising copywriter at [employer] needs: 1400 calories 158 g carbohydrates 105 g protein 39 g fat  Progress Towards Goal(s):  In progress.   Nutritional Diagnosis:  NB-1.1 Food and nutrition-related knowledge deficit As related to uncontrolled diabetes.  As evidenced by A1c of 12.6% on 05/23/2012.    Intervention:  Nutrition counseling and diabetes education initiated. Discussed basic physiology of diabetes, SMBG and rationale of checking BG at alternate times of day, A1c, and benefits of increased activity. Spent most of this visit on how to use her BG meter and insulin action and timing of insulin administration. Plan to discuss  Carb Counting and reading food labels at next  visit.  Plan:  Consider checking BG at least once every day, and alternate times per day including before each meal or at bedtime  Consider taking Novolog insulin before each meal  Consider taking Lantus insulin at 9 PM each night.  (If you are late taking the Lantus it is better to go ahead and take it rather than skipping it.) Consider rotating your injection sites around your stomach, outer thighs and upper buttocks areas.  Handouts given during visit include: Living Well with Diabetes Carb Counting and Food Label handouts Meal Plan Card Insulin Action handout Sites for insulin injections  Monitoring/Evaluation:  Dietary intake, exercise, SMBG, and body weight in 4 week(s).

## 2012-07-05 ENCOUNTER — Encounter: Payer: Self-pay | Admitting: Cardiovascular Disease

## 2012-07-05 ENCOUNTER — Encounter: Payer: Self-pay | Admitting: *Deleted

## 2012-07-07 ENCOUNTER — Encounter: Payer: 59 | Admitting: Cardiovascular Disease

## 2012-07-26 ENCOUNTER — Encounter: Payer: 59 | Attending: Internal Medicine | Admitting: *Deleted

## 2012-07-26 ENCOUNTER — Encounter: Payer: Self-pay | Admitting: *Deleted

## 2012-07-26 VITALS — Ht 66.0 in | Wt 185.7 lb

## 2012-07-26 DIAGNOSIS — E119 Type 2 diabetes mellitus without complications: Secondary | ICD-10-CM | POA: Insufficient documentation

## 2012-07-26 DIAGNOSIS — Z713 Dietary counseling and surveillance: Secondary | ICD-10-CM | POA: Insufficient documentation

## 2012-07-26 NOTE — Patient Instructions (Addendum)
Plan:  Consider aiming for 3 Carb Choices (45 grams) per meal  Consider aiming for 0-2 Carb Choices per snack if hungry Continue checking BG at least once every day, and alternate times per day including before each meal or at bedtime  Consider taking Novolog insulin before each meal ( start taking at lunch when at work to help bring down afternoon BGs) Continue taking Lantus insulin at 9 PM each night.  (If you are late taking the Lantus it is better to go ahead and take it rather than skipping it.) Continue rotating your injection sites around your stomach, outer thighs and upper buttocks areas.

## 2012-07-26 NOTE — Progress Notes (Signed)
  Medical Nutrition Therapy:  Appt start time: 1100 end time:  1130.  Assessment:  Primary concerns today: patient here for diabetes education follow up visit. States she is taking the Lantus more consistently in the evening time but not the Novolog if she is at work. She is able to check her BG successfully now. Weight is basically stable since her last visit 4 weeks ago.   MEDICATIONS: see list, diabetes medications include Novolog, Lantus and Metformin   DIETARY INTAKE:  Usual eating pattern includes 3 meals and 3 snacks per day.  Everyday foods include fair variety of all food groups.  Avoided foods include most high calorie foods.    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  Estimated energy needs: 1400 calories 158 g carbohydrates 105 g protein 39 g fat  Progress Towards Goal(s):  In progress.   Nutritional Diagnosis:  NB-1.1 Food and nutrition-related knowledge deficit As related to uncontrolled diabetes.  As evidenced by A1c of 12.6% on 05/23/2012.    Intervention: Nutrition counseling and diabetes education continued. Discussed Carb Counting and reading food labels and benefits of increased activity at this visit. Patient expressed fair understanding of Carb Counting and is willing to start modifying her carb intake as a method of portion control and to help manage her BG better.  Plan:  Consider aiming for 3 Carb Choices (45 grams) per meal  Consider aiming for 0-2 Carb Choices per snack if hungry Continue  checking BG at least once every day, and alternate times per day including before each meal or at bedtime  Consider taking Novolog insulin before each meal ( start taking at lunch when at work to help bring down afternoon BGs) Continue taking Lantus insulin at 9 PM each night.  (If you are late taking the Lantus it is better to go ahead and take it rather than skipping it.) Continue rotating your injection sites around your stomach, outer thighs and upper buttocks areas.   Handouts given during visit include: Carb Counting and Food Label handouts Meal Plan Card  Monitoring/Evaluation:  Dietary intake, exercise, SMBG, and body weight in 4 week(s).

## 2012-08-04 ENCOUNTER — Encounter: Payer: Self-pay | Admitting: Cardiovascular Disease

## 2012-08-25 ENCOUNTER — Ambulatory Visit: Payer: 59 | Admitting: *Deleted

## 2012-09-30 ENCOUNTER — Encounter (HOSPITAL_COMMUNITY): Payer: Self-pay | Admitting: Emergency Medicine

## 2012-09-30 ENCOUNTER — Emergency Department (HOSPITAL_COMMUNITY): Payer: 59

## 2012-09-30 ENCOUNTER — Inpatient Hospital Stay (HOSPITAL_COMMUNITY)
Admission: EM | Admit: 2012-09-30 | Discharge: 2012-10-03 | DRG: 310 | Disposition: A | Discharge status: Home / Self Care | Payer: 59 | Attending: Cardiovascular Disease | Admitting: Cardiovascular Disease

## 2012-09-30 DIAGNOSIS — E2609 Other primary hyperaldosteronism: Secondary | ICD-10-CM | POA: Diagnosis present

## 2012-09-30 DIAGNOSIS — I10 Essential (primary) hypertension: Secondary | ICD-10-CM

## 2012-09-30 DIAGNOSIS — R9431 Abnormal electrocardiogram [ECG] [EKG]: Secondary | ICD-10-CM

## 2012-09-30 DIAGNOSIS — E1169 Type 2 diabetes mellitus with other specified complication: Secondary | ICD-10-CM | POA: Diagnosis present

## 2012-09-30 DIAGNOSIS — Z9119 Patient's noncompliance with other medical treatment and regimen: Secondary | ICD-10-CM

## 2012-09-30 DIAGNOSIS — E119 Type 2 diabetes mellitus without complications: Secondary | ICD-10-CM

## 2012-09-30 DIAGNOSIS — Z91199 Patient's noncompliance with other medical treatment and regimen due to unspecified reason: Secondary | ICD-10-CM

## 2012-09-30 DIAGNOSIS — R791 Abnormal coagulation profile: Secondary | ICD-10-CM | POA: Diagnosis present

## 2012-09-30 DIAGNOSIS — IMO0001 Reserved for inherently not codable concepts without codable children: Secondary | ICD-10-CM | POA: Diagnosis present

## 2012-09-30 DIAGNOSIS — Z794 Long term (current) use of insulin: Secondary | ICD-10-CM

## 2012-09-30 DIAGNOSIS — I4891 Unspecified atrial fibrillation: Principal | ICD-10-CM

## 2012-09-30 HISTORY — DX: Personal history of other specified conditions: Z87.898

## 2012-09-30 LAB — PROTIME-INR
INR: >10 (ref 0.00–1.49)
Prothrombin Time: >90 seconds — ABNORMAL HIGH (ref 11.6–15.2)

## 2012-09-30 LAB — CBC
MCHC: 34.1 g/dL (ref 30.0–36.0)
Platelets: 263 10*3/uL (ref 150–400)
RDW: 13.6 % (ref 11.5–15.5)
WBC: 9.1 10*3/uL (ref 4.0–10.5)

## 2012-09-30 LAB — GLUCOSE, CAPILLARY
Glucose-Capillary: 223 mg/dL — ABNORMAL HIGH (ref 70–99)
Glucose-Capillary: 318 mg/dL — ABNORMAL HIGH (ref 70–99)

## 2012-09-30 LAB — BASIC METABOLIC PANEL
BUN: 18 mg/dL (ref 6–23)
Calcium: 8.7 mg/dL (ref 8.4–10.5)
Creatinine, Ser: 1.02 mg/dL (ref 0.50–1.10)
GFR calc Af Amer: 78 mL/min — ABNORMAL LOW (ref >90)
GFR calc non Af Amer: 67 mL/min — ABNORMAL LOW (ref >90)

## 2012-09-30 LAB — POCT I-STAT TROPONIN I

## 2012-09-30 LAB — POCT PREGNANCY, URINE: Preg Test, Ur: NEGATIVE

## 2012-09-30 LAB — MAGNESIUM: Magnesium: 2.2 mg/dL (ref 1.5–2.5)

## 2012-09-30 MED ORDER — SODIUM CHLORIDE 0.9 % IJ SOLN
3.0000 mL | Freq: Two times a day (BID) | INTRAMUSCULAR | Status: DC
  Administered 2012-10-01 – 2012-10-02 (×4): 3 mL via INTRAVENOUS

## 2012-09-30 MED ORDER — ONDANSETRON HCL 4 MG/2ML IJ SOLN: 4.0000 mg | Freq: Four times a day (QID) | INTRAMUSCULAR | Status: DC | PRN

## 2012-09-30 MED ORDER — DILTIAZEM HCL 100 MG IV SOLR
5.0000 mg/h | INTRAVENOUS | Status: DC
  Administered 2012-09-30 – 2012-10-01 (×4): 10 mg/h via INTRAVENOUS
  Administered 2012-10-01: 15 mg/h via INTRAVENOUS
  Filled 2012-09-30 (×3): qty 100

## 2012-09-30 MED ORDER — INSULIN GLARGINE 100 UNIT/ML ~~LOC~~ SOLN
55.0000 [IU] | Freq: Every day | SUBCUTANEOUS | Status: DC
  Administered 2012-09-30 – 2012-10-02 (×3): 55 [IU] via SUBCUTANEOUS
  Filled 2012-09-30 (×4): qty 0.55

## 2012-09-30 MED ORDER — SODIUM CHLORIDE 0.9 % IV SOLN
250.0000 mL | INTRAVENOUS | Status: DC | PRN
  Administered 2012-10-01: 250 mL via INTRAVENOUS

## 2012-09-30 MED ORDER — SODIUM CHLORIDE 0.9 % IJ SOLN
3.0000 mL | INTRAMUSCULAR | Status: DC | PRN
  Administered 2012-10-03 (×3): 3 mL via INTRAVENOUS

## 2012-09-30 MED ORDER — NITROGLYCERIN 0.4 MG SL SUBL: 0.4000 mg | SUBLINGUAL_TABLET | SUBLINGUAL | Status: DC | PRN

## 2012-09-30 MED ORDER — RIVAROXABAN 20 MG PO TABS
20.0000 mg | ORAL_TABLET | Freq: Every day | ORAL | Status: DC
Start: 2012-09-30 — End: 2012-10-03
  Administered 2012-09-30 – 2012-10-02 (×3): 20 mg via ORAL
  Filled 2012-09-30 (×4): qty 1

## 2012-09-30 MED ORDER — LISINOPRIL 5 MG PO TABS
5.0000 mg | ORAL_TABLET | Freq: Every day | ORAL | Status: DC
Start: 2012-09-30 — End: 2012-10-03
  Administered 2012-09-30 – 2012-10-03 (×4): 5 mg via ORAL
  Filled 2012-09-30 (×4): qty 1

## 2012-09-30 MED ORDER — INSULIN ASPART 100 UNIT/ML ~~LOC~~ SOLN
8.0000 [IU] | Freq: Three times a day (TID) | SUBCUTANEOUS | Status: DC
  Administered 2012-09-30 – 2012-10-03 (×9): 8 [IU] via SUBCUTANEOUS

## 2012-09-30 MED ORDER — ACETAMINOPHEN 325 MG PO TABS: 650.0000 mg | ORAL_TABLET | ORAL | Status: DC | PRN

## 2012-09-30 MED ORDER — METFORMIN HCL 500 MG PO TABS
500.0000 mg | ORAL_TABLET | Freq: Every day | ORAL | Status: DC
  Administered 2012-09-30 – 2012-10-03 (×4): 500 mg via ORAL
  Filled 2012-09-30 (×5): qty 1

## 2012-09-30 MED ORDER — INSULIN ASPART 100 UNIT/ML ~~LOC~~ SOLN
0.0000 [IU] | Freq: Three times a day (TID) | SUBCUTANEOUS | Status: DC
  Administered 2012-10-01 (×2): 5 [IU] via SUBCUTANEOUS
  Administered 2012-10-01: 07:00:00 9 [IU] via SUBCUTANEOUS
  Administered 2012-10-02: 2 [IU] via SUBCUTANEOUS
  Administered 2012-10-02 (×2): 3 [IU] via SUBCUTANEOUS
  Administered 2012-10-03: 1 [IU] via SUBCUTANEOUS

## 2012-09-30 MED ORDER — DEXTROSE 5 % IV SOLN
5.0000 mg/h | INTRAVENOUS | Status: DC
  Administered 2012-09-30: 300 mg/h via INTRAVENOUS
  Administered 2012-09-30: 5 mg/h via INTRAVENOUS
  Filled 2012-09-30: qty 100

## 2012-09-30 MED ORDER — DILTIAZEM LOAD VIA INFUSION
15.0000 mg | Freq: Once | INTRAVENOUS | Status: AC
  Administered 2012-09-30: 15 mg via INTRAVENOUS
  Filled 2012-09-30: qty 15

## 2012-09-30 NOTE — Progress Notes (Signed)
Called by RN because of elevated INR/PT.  Lab Results  Component Value Date   INR >10.00* 09/30/2012   Pt not having any bleeding issues.   Xarelto given tonight but not till 10 minutes after the INR was drawn, per the chart.   Possible lab error, possibly the Xarelto was actually given before the blood was drawn.   Spoke with pharmacy, no point in checking INR again since it will now be elevated due to the Xarelto. Will check LFTs in am, but no signs/symptoms of liver problem.   Theodore Demark, PA-C 09/30/2012 8:15 PM Beeper [phone]

## 2012-09-30 NOTE — ED Notes (Signed)
MD at bedside. 

## 2012-09-30 NOTE — ED Notes (Signed)
Contacted pharmacy for second time asking for cardizem gtt.  They will send it.

## 2012-09-30 NOTE — ED Notes (Signed)
Patient is resting comfortably. 

## 2012-09-30 NOTE — H&P (Signed)
CARDIOLOGY CONSULT NOTE  Patient ID: [name], MRN: [id number], DOB/AGE: 1970/08/27 42 y.o. Admit date: 09/30/2012   Date of Consult: 09/30/2012 Primary Physician: Dorrene German, MD Primary Cardiologist: Eden Emms  Chief Complaint: rapid heart rate Reason for Consult: recurrence of atrial fib with RVR  HPI: [name] is a 42 y/o F with history of HTN, diabetes mellitus, and paroxysmal atrial fibrillation who presented to [facility] overnight with recurrent atrial fib and markedly elevated blood sugar. She has h/o PAF dating back to 07/2011 at which time she presented to the ED, received Flecainide 300mg  x 1, converted to sinus, was discharged, and then never followed up with cardiology as instructed. She returned 05/2012 with rapid afib and was placed on Xarelto (CHADSVASC2 = 3) and maintenance flecainide. Post-hospital followup with ETT was arranged but she no-showed. She states this was because of her kids' schedule. She works in [employer] and has been able to do her job without difficulty recently. She states she's been out of Metformin for 1 week. She initially reported compliance with both flecainide and Xarelto. However, her Xarelto was only prescribed for 30 tabs 1 refill in May so should have run out in July - when asked about this further, she admits to being out of this. She states she was planning to follow up with her doctor to discuss this. She still upholds taking flecainide BID.  Last night around 11:30pm she noticed her heart begin to race suddenly. She got up to go to the bathroom and it got worse. She felt somewhat weak and sweaty so EMS was called. Per discussion with EDP, the fastest HR EMS saw was 180s. 6mg  and 12mg  doses of adenosine were given. Rhythm was assessed as AF-RVR thus cardizem bolus was then given. She now remains on drip with HR 80s-100s with increase to 120s with even low-level activity. She denies CP, SOB, nausea, vomiting,  presyncope or syncope. She had an episode of chest pain lasting 3 days last month relieved with ibuprofen that was not associated with exertion or palpitations. She denies any exertional symptoms. She currently feels better, but is able to feel her heart race whenever she moves around. She does not think she's had any interim episodes of atrial fib since May. CBG 419, troponin neg x 1, uHcg neg, otherwise labs unremarkable.  Past Medical History  Diagnosis Date  . Diabetes mellitus     a. 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 b. 05/2012 A1c 12.6.  . Hypertension   . PAF (paroxysmal atrial fibrillation)     a. 07/2011 ED visit with AF-> converted with flecainide, never followed-up;  b. 05/2012 Recurrent Afib -echo: EF 65-60%, mildly dil LA. Placed on Xarelto/daily Flecainide.      Most Recent Cardiac Studies: 2D Echo 05/2012 - Left ventricle: There was mild focal basal hypertrophy of the septum. Systolic function was vigorous. The estimated ejection fraction was in the range of 65% to 70%. - Left atrium: The atrium was mildly dilated.   Surgical History:  Past Surgical History  Procedure Laterality Date  . Cesarean section    . Hernia repair       Home Meds: Prior to Admission medications   Medication Sig Start Date End Date Taking? Authorizing Provider  flecainide (TAMBOCOR) 50 MG tablet Take 1 tablet (50 mg total) by mouth 2 (two) times daily. 05/24/12  Yes Calvert Cantor, MD  insulin aspart (NOVOLOG) 100 UNIT/ML injection Inject 8 Units  into the skin 3 (three) times daily with meals. 05/24/12  Yes Calvert Cantor, MD  insulin glargine (LANTUS) 100 UNIT/ML injection Inject 0.55 mLs (55 Units total) into the skin at bedtime. 05/24/12  Yes Calvert Cantor, MD  lisinopril (PRINIVIL,ZESTRIL) 5 MG tablet Take 5 mg by mouth daily.   Yes Historical Provider, MD  metFORMIN (GLUCOPHAGE) 500 MG tablet Take 500 mg by mouth 2 (two) times daily with a meal.   Yes Historical  Provider, MD  Rivaroxaban (XARELTO) 20 MG TABS Take 1 tablet (20 mg total) by mouth daily with supper. 05/24/12  Yes Calvert Cantor, MD    Inpatient Medications:    . diltiazem (CARDIZEM) infusion 10 mg/hr (09/30/12 0414)    Allergies: No Known Allergies  History   Social History  . Marital Status: Married    Spouse Name: N/A    Number of Children: N/A  . Years of Education: N/A   Occupational History  . Not on file.   Social History Main Topics  . Smoking status: Never Smoker   . Smokeless tobacco: Never Used  . Alcohol Use: No  . Drug Use: No  . Sexual Activity: Not on file   Other Topics Concern  . Not on file   Social History Narrative   Lives in [place] with significant other.  Works @ [employer] in [employer].  Does not routinely exercise.     Family History  Problem Relation Age of Onset  . Other Father     killed in [place] civil war when he was in his 83's  . Asthma Mother     alive in her 70's  . Other Sister     [employer]  . Other Sister     [employer]     Review of Systems: General: negative for chills, fever, night sweats  Cardiovascular: no orthopnea, LEE Dermatological: negative for rash Respiratory: negative for cough or wheezing Urologic: negative for hematuria Abdominal: negative for nausea, vomiting, diarrhea, bright red blood per rectum, melena, or hematemesis Neurologic: negative for visual changes, syncope, or dizziness All other systems reviewed and are otherwise negative except as noted above.  Labs:  Troponin 0.02  Lab Results  Component Value Date   WBC 9.1 09/30/2012   HGB 12.9 09/30/2012   HCT 37.8 09/30/2012   MCV 77.9* 09/30/2012   PLT 263 09/30/2012    Recent Labs Lab 09/30/12 0140  NA 136  K 3.6  CL 96  CO2 26  BUN 18  CREATININE 1.02  CALCIUM 8.7  GLUCOSE 419*   Lab Results  Component Value Date   CHOL 100 05/24/2012   HDL 47 05/24/2012   LDLCALC 32 05/24/2012   TRIG 104 05/24/2012   Radiology/Studies:  Dg Chest  Port 1 View 09/30/2012   CLINICAL DATA:  Atrial fibrillation.  EXAM: PORTABLE CHEST - 1 VIEW  COMPARISON:  Single view of the chest 07/20/2011 and 05/23/2012.  FINDINGS: Heart size is upper normal. There is pulmonary vascular congestion without frank edema. No focal airspace disease or effusion.  IMPRESSION: No acute finding.   Electronically Signed   By: Drusilla Kanner M.D.   On: 09/30/2012 02:25   EKG: atrial fibrillation 120bpm, nonspecific ST-T changes, borderline RAD, QTc 512 QTc hand calculated (averaged) at 506  Physical Exam: Blood pressure 154/106, pulse 99, temperature 98 F (36.7 C), temperature source Oral, resp. rate 16, height 5\' 6"  (1.676 m), weight 200 lb (90.719 kg), last menstrual period 10/01/2006, SpO2 98.00%. General:  Well developed, well nourished AAF in no acute distress. Head: Normocephalic, atraumatic, sclera non-icteric, no xanthomas, nares are without discharge.  Neck: Negative for carotid bruits. JVD not elevated. Lungs: Clear bilaterally to auscultation without wheezes, rales, or rhonchi. Breathing is unlabored. Heart: Irregularly irregular, borderline tachycardic with S1 S2. No murmurs, rubs, or gallops appreciated. Abdomen: Soft, non-tender, non-distended with normoactive bowel sounds. No hepatomegaly. No rebound/guarding. No obvious abdominal masses. Msk:  Strength and tone appear normal for age. Extremities: No clubbing or cyanosis. No edema.  Distal pedal pulses are 2+ and equal bilaterally. Neuro: Alert and oriented X 3. No facial asymmetry. No focal deficit. Moves all extremities spontaneously. Psych:  Responds to questions appropriately with a normal affect.   Assessment and Plan:   1. Paroxysmal atrial fibrillation - it sounds like her episode of afib started last night, however, there is concern for unreliable history. She initially reported med compliance with flecainide and Xarelto but later admitted to being out of Xarelto when asked about where she had  gotten refills. It is not clear she is a good candidate for flecainide given lack of outpatient followup. Could consider placing her back on it if compliance is demonstrated as long as she complies with ETT. Will continue diltiazem drip, use heparin to bridge to restart her qsupper Xarelto, and plan for possible TEE/DCCV on Monday if she does not convert. CHADSVASC = 3 so anticoag is recommended for stroke prophylaxis. Thyroid normal in May.  2. Noncompliance - pt was educated on importance of office follow-up and medication. Consult care manager to see if we can identify any barriers to follow up & establish a PCP. 3. HTN - continue lisinopril. Follow BP on diltiazem. 4. Diabetes mellitus, suspect still uncontrolled - check A1C. Continue Lantus and home meal coverage. Resume Metformin. Add SSI. Consult diabetes coordinator for education. 5. QTc prolongation - hold flecainide for now and follow.  Signed, Ronie Spies PA-C 09/30/2012, 6:39 AM   Attending Note:   The patient was seen and examined.  Agree with assessment and plan as noted above.  Changes made to the above note as needed.  Patient presents with recurrent A-fib.  We really cannot be sure of the duration of her AF - she admits to not taking her xarelto.  I do not think we should do cardioversion today.  I would favor restarting xarelto - would give IV heparin until the xarelto dose tonight. IV dilt for rate control. anticipate TEE cardioversion on Monday.    She is comfortable for now.    Vesta Mixer, Montez Hageman., MD, [facility] 09/30/2012, 12:04 PM

## 2012-09-30 NOTE — Consult Note (Signed)
PHARMACY CONSULT NOTE  Pharmacy Consult:  Heparin bridging until Restart of Home Xarelto with this evenings supper. Indication: atrial fibrillation  Assessment/Plan:    Consult order released at 16:59 PM for Heparin bridging.  First dose of Xarelto ordered for now, 5:39 PM .  Heparin will not be started as Xarelto scheduled for now.  Thank you.  [place] Jakubiak, Elisha Headland,  Pharm.D. ,  09/30/2012, 5:40 PM

## 2012-09-30 NOTE — ED Notes (Signed)
Patient is resting comfortably, friend at the bedside.

## 2012-09-30 NOTE — Progress Notes (Signed)
TEE/DCCV tenatively scheduled with Dr. Jens Som for Monday at 11am. If she does not convert to NSR over the weekend, will need orders for this. Samarie Pinder PA-C

## 2012-09-30 NOTE — ED Notes (Signed)
Patient arrives via EMS with complaint of A-fib with RVR. Patient explained that her heart felt like it was racing and she felt weak prompting her to call EMS. Initial HRs were 180 accelerating to 230 at one point. 6mg  and 12mg  doses of adenosine were given. Rhythm was assessed as being a-fib. Cardizem 20mg  was given and HR improved to 100-120.

## 2012-09-30 NOTE — ED Notes (Signed)
Pt was sleeping and woke to use BR, discovered fast heart rate.  Sat down and did not feel any better, called 911.  Received adenosine without conversion; was given 20mg  cardizem, hr slower now.

## 2012-09-30 NOTE — ED Notes (Signed)
Emptied bedpan and helped her to the beside commode.

## 2012-09-30 NOTE — ED Notes (Signed)
Pt sleeping; monitor shows A fib, rate varies up to 108.

## 2012-09-30 NOTE — Progress Notes (Signed)
Pt arrived from ED via stretcher and oriented to room.  Call bell at reach.  Instructed to call for assistance.  Verbalized understanding.  Will continue to monitor.

## 2012-09-30 NOTE — ED Provider Notes (Signed)
CSN: [id number]     Arrival date & time 09/30/12  0110 History   First MD Initiated Contact with Patient 09/30/12 0119     Chief Complaint  Patient presents with  . Atrial Fibrillation  . Tachycardia   (Consider location/radiation/quality/duration/timing/severity/associated sxs/prior Treatment) Patient is a 42 y.o. female presenting with palpitations.  Palpitations Palpitations quality:  Irregular Onset quality:  Sudden Timing:  Constant Progression:  Unchanged Chronicity:  Recurrent Context: not caffeine, not illicit drugs and not stimulant use   Relieved by: cardizem given by EMS. Ineffective treatments: adenosine. Associated symptoms: weakness (generalized)   Associated symptoms: no chest pain, no cough, no dizziness, no nausea, no shortness of breath, no syncope and no vomiting     Past Medical History  Diagnosis Date  . Diabetes mellitus     a. Gestational Diabetes with both children;  b. 05/2012 A1c 12.6  . Hypertension   . PAF (paroxysmal atrial fibrillation)     a. 07/2011 ED visit with AF-> converted with flecainide, never followed-up;  b. 05/2012 Recurrent Afib, c. Echo: EF 65-60%, mildly dil LA.   Past Surgical History  Procedure Laterality Date  . Cesarean section    . Hernia repair     Family History  Problem Relation Age of Onset  . Other Father     killed in [place] civil war when he was in his 59's  . Asthma Mother     alive in her 42's  . Other Sister     A & W  . Other Sister     A & W   History  Substance Use Topics  . Smoking status: Never Smoker   . Smokeless tobacco: Never Used  . Alcohol Use: No   OB History   Grav Para Term Preterm Abortions TAB SAB Ect Mult Living                 Review of Systems  Constitutional: Negative for fever.  HENT: Negative for congestion.   Respiratory: Negative for cough and shortness of breath.   Cardiovascular: Positive for palpitations. Negative for chest pain and syncope.  Gastrointestinal: Negative  for nausea, vomiting, abdominal pain and diarrhea.  Neurological: Negative for dizziness.  All other systems reviewed and are negative.    Allergies  Review of patient's allergies indicates no known allergies.  Home Medications   Current Outpatient Rx  Name  Route  Sig  Dispense  Refill  . flecainide (TAMBOCOR) 50 MG tablet   Oral   Take 1 tablet (50 mg total) by mouth 2 (two) times daily.   60 tablet   11   . insulin aspart (NOVOLOG) 100 UNIT/ML injection   Subcutaneous   Inject 8 Units into the skin 3 (three) times daily with meals.   1 vial   12   . insulin glargine (LANTUS) 100 UNIT/ML injection   Subcutaneous   Inject 0.55 mLs (55 Units total) into the skin at bedtime.   10 mL   12   . lisinopril (PRINIVIL,ZESTRIL) 5 MG tablet   Oral   Take 5 mg by mouth daily.         . metFORMIN (GLUCOPHAGE) 500 MG tablet   Oral   Take 500 mg by mouth 2 (two) times daily with a meal.         . Rivaroxaban (XARELTO) 20 MG TABS   Oral   Take 1 tablet (20 mg total) by mouth daily with supper.   30 tablet  1    BP 154/100  Temp(Src) 98 F (36.7 C) (Oral)  Resp 20  Ht 5\' 6"  (1.676 m)  Wt 200 lb (90.719 kg)  BMI 32.3 kg/m2  SpO2 97%  LMP 10/01/2006 Physical Exam  Nursing note and vitals reviewed. Constitutional: She is oriented to person, place, and time. She appears well-developed and well-nourished. No distress.  HENT:  Head: Normocephalic and atraumatic.  Mouth/Throat: Oropharynx is clear and moist.  Eyes: Conjunctivae are normal. Pupils are equal, round, and reactive to light. No scleral icterus.  Neck: Neck supple.  Cardiovascular: Normal heart sounds and intact distal pulses.  An irregularly irregular rhythm present. Tachycardia present.   No murmur heard. Pulmonary/Chest: Effort normal and breath sounds normal. No stridor. No respiratory distress. She has no rales.  Abdominal: Soft. Bowel sounds are normal. She exhibits no distension. There is no  tenderness.  Musculoskeletal: Normal range of motion.  Neurological: She is alert and oriented to person, place, and time.  Skin: Skin is warm and dry. No rash noted.  Psychiatric: She has a normal mood and affect. Her behavior is normal.    ED Course  CRITICAL CARE Performed by: Blake Divine DAVID Authorized by: Blake Divine DAVID Total critical care time: 30 minutes Critical care time was exclusive of separately billable procedures and treating other patients. Critical care was necessary to treat or prevent imminent or life-threatening deterioration of the following conditions: circulatory failure. Critical care was time spent personally by me on the following activities: development of treatment plan with patient or surrogate, discussions with consultants, evaluation of patient's response to treatment, examination of patient, obtaining history from patient or surrogate, ordering and performing treatments and interventions, ordering and review of laboratory studies, ordering and review of radiographic studies, pulse oximetry, re-evaluation of patient's condition and review of old charts.   (including critical care time) Labs Review Labs Reviewed  BASIC METABOLIC PANEL - Abnormal; Notable for the following:    Glucose, Bld 419 (*)    GFR calc non Af Amer 67 (*)    GFR calc Af Amer 78 (*)    All other components within normal limits  CBC - Abnormal; Notable for the following:    MCV 77.9 (*)    All other components within normal limits  POCT I-STAT TROPONIN I  POCT PREGNANCY, URINE   Imaging Review Dg Chest Port 1 View  09/30/2012   CLINICAL DATA:  Atrial fibrillation.  EXAM: PORTABLE CHEST - 1 VIEW  COMPARISON:  Single view of the chest 07/20/2011 and 05/23/2012.  FINDINGS: Heart size is upper normal. There is pulmonary vascular congestion without frank edema. No focal airspace disease or effusion.  IMPRESSION: No acute finding.   Electronically Signed   By: Drusilla Kanner M.D.    On: 09/30/2012 02:25  All radiology studies independently viewed by me.     EKG - a fib, rate 110, normal axis, normal QRSD, baseline wander, no ST/T changes, similar to prior.  EKG 2 - a fib, rate 120, normal axis, normal QRSD, no ST/T changes, compared to prior, baseline wander resolved.  MDM   1. Atrial fibrillation with RVR    42 year old female with a history of atrial fibrillation presenting with A. fib with RVR. EMS reports heart rates in 180s on their arrival. She received adenosine and Cardizem prior to arrival. She remained with a heart rate between 110 and 150. She required rebolusing Cardizem and Cardizem drip. This has controlled her rate. Cardiology has been consulted.  Candyce Churn, MD 09/30/12 2014

## 2012-09-30 NOTE — ED Notes (Signed)
Report to Anna-RN.

## 2012-10-01 LAB — GLUCOSE, CAPILLARY
Glucose-Capillary: 262 mg/dL — ABNORMAL HIGH (ref 70–99)
Glucose-Capillary: 191 mg/dL — ABNORMAL HIGH (ref 70–99)

## 2012-10-01 LAB — BASIC METABOLIC PANEL
CO2: 26 mEq/L (ref 19–32)
Calcium: 9.6 mg/dL (ref 8.4–10.5)
Chloride: 97 mEq/L (ref 96–112)
GFR calc non Af Amer: >90 mL/min (ref >90)
Glucose, Bld: 292 mg/dL — ABNORMAL HIGH (ref 70–99)
Potassium: 4 mEq/L (ref 3.5–5.1)
Sodium: 136 mEq/L (ref 135–145)

## 2012-10-01 LAB — CBC
Hemoglobin: 14.9 g/dL (ref 12.0–15.0)
MCH: 26.7 pg (ref 26.0–34.0)
MCHC: 34.7 g/dL (ref 30.0–36.0)
Platelets: 309 10*3/uL (ref 150–400)
RBC: 5.58 MIL/uL — ABNORMAL HIGH (ref 3.87–5.11)
WBC: 10.2 10*3/uL (ref 4.0–10.5)

## 2012-10-01 LAB — TROPONIN I: Troponin I: <0.3 ng/mL (ref <0.30)

## 2012-10-01 LAB — HEMOGLOBIN A1C: Mean Plasma Glucose: 326 mg/dL — ABNORMAL HIGH (ref <117)

## 2012-10-01 MED ORDER — PNEUMOCOCCAL VAC POLYVALENT 25 MCG/0.5ML IJ INJ
0.5000 mL | INJECTION | INTRAMUSCULAR | Status: AC
  Administered 2012-10-02: 0.5 mL via INTRAMUSCULAR
  Filled 2012-10-01: qty 0.5

## 2012-10-01 MED ORDER — DILTIAZEM HCL ER COATED BEADS 120 MG PO CP24
120.0000 mg | ORAL_CAPSULE | Freq: Every day | ORAL | Status: DC
  Administered 2012-10-02 – 2012-10-03 (×2): 120 mg via ORAL
  Filled 2012-10-01 (×2): qty 1

## 2012-10-01 NOTE — Progress Notes (Signed)
Pt's HR is jumping up to 140-150 while pt is at rest.  Titrated Cardizem up to 15 mg/hr. Tolerating well, will continue to monitor.

## 2012-10-01 NOTE — Plan of Care (Signed)
Problem: Food- and Nutrition-Related Knowledge Deficit (NB-1.1) Goal: Nutrition education Formal process to instruct or train a patient/client in a skill or to impart knowledge to help patients/clients voluntarily manage or modify food choices and eating behavior to maintain or improve health. Nutrition Brief Note  Patient identified on the Malnutrition Screening Tool (MST) Report.    Wt Readings from Last 15 Encounters:  10/01/12 178 lb 9.2 oz (81 kg)  07/26/12 185 lb 11.2 oz (84.233 kg)  06/30/12 184 lb 14.4 oz (83.87 kg)  05/24/12 180 lb 12.4 oz (82 kg)  07/20/11 180 lb 12.4 oz (82 kg)    Body mass index is 28.84 kg/(m^2). Patient meets criteria for overweight based on current BMI.   Current diet order is CHO Mod Medium, patient is consuming approximately 100% of meals at this time. Labs and medications reviewed.   Pt states that she is eating well and her wt has been fairly stable.  RD notes most recent HgBA1C. Lab Results  Component Value Date    HGBA1C 13.0* 09/30/2012    RD consulted for nutrition education regarding diabetes.     Lab Results  Component Value Date    HGBA1C 13.0* 09/30/2012    RD provided "Carbohydrate Counting for People with Diabetes" handout from the Academy of [employer] Dietetics. Discussed different food groups and their effects on blood sugar, emphasizing carbohydrate-containing foods. Provided list of carbohydrates and recommended serving sizes of common foods.  Discussed importance of controlled and consistent carbohydrate intake throughout the day. Provided examples of ways to balance meals/snacks and encouraged intake of high-fiber, whole grain complex carbohydrates. Teach back method used.  Expect good compliance. No additional nutrition interventions warranted at this time. If nutrition issues arise, please consult RD.   Loyce Dys, MS RD LDN Clinical Inpatient Dietitian Pager: [phone] Weekend/After hours pager: [phone]

## 2012-10-01 NOTE — Progress Notes (Signed)
The patient slept well overnight and does not have any complaints of pain.  Her HR elevated between 140-150 the few times that she ambulated during the night and her HR recovered within one minute with rest.  Otherwise, her heart rate was controlled in the 80s and 90s on 10 mg/hr of Cardizem.  She remained in atrial fibrillation overnight.  Extra care was taken when ambulating the patient due to her critical INR level.

## 2012-10-01 NOTE — Progress Notes (Signed)
SUBJECTIVE:  No chest pain.  No SOB   PHYSICAL EXAM Filed Vitals:   09/30/12 1640 09/30/12 1717 09/30/12 2054 10/01/12 0537  BP: 148/96  115/70 108/68  Pulse: 101 86 75 79  Temp: 98.6 F (37 C)  98 F (36.7 C) 97.7 F (36.5 C)  TempSrc: Oral  Oral Oral  Resp: 20  20 18   Height: 5\' 6"  (1.676 m)     Weight: 179 lb 3.7 oz (81.3 kg)   178 lb 9.2 oz (81 kg)  SpO2: 99%  97% 98%   General:  No distress Lungs:  Clear Heart:  Irregular Abdomen:  Positive bowel sounds, no rebound no guarding Extremities:  No edema  LABS: Lab Results  Component Value Date   TROPONINI <0.30 10/01/2012   Results for orders placed during the hospital encounter of 09/30/12 (from the past 24 hour(s))  GLUCOSE, CAPILLARY     Status: Abnormal   Collection Time    09/30/12  5:18 PM      Result Value Range   Glucose-Capillary 223 (*) 70 - 99 mg/dL   Comment 1 Notify RN    MAGNESIUM     Status: None   Collection Time    09/30/12  5:30 PM      Result Value Range   Magnesium 2.2  1.5 - 2.5 mg/dL  HEMOGLOBIN O9G     Status: Abnormal   Collection Time    09/30/12  6:25 PM      Result Value Range   Hemoglobin A1C 13.0 (*) <5.7 %   Mean Plasma Glucose 326 (*) <117 mg/dL  TROPONIN I     Status: None   Collection Time    09/30/12  6:25 PM      Result Value Range   Troponin I <0.30  <0.30 ng/mL  PROTIME-INR     Status: Abnormal   Collection Time    09/30/12  6:25 PM      Result Value Range   Prothrombin Time >90.0 (*) 11.6 - 15.2 seconds   INR >10.00 (*) 0.00 - 1.49  GLUCOSE, CAPILLARY     Status: Abnormal   Collection Time    09/30/12  9:36 PM      Result Value Range   Glucose-Capillary 318 (*) 70 - 99 mg/dL  TROPONIN I     Status: None   Collection Time    09/30/12 10:15 PM      Result Value Range   Troponin I <0.30  <0.30 ng/mL  TROPONIN I     Status: None   Collection Time    10/01/12  4:59 AM      Result Value Range   Troponin I <0.30  <0.30 ng/mL  CBC     Status: Abnormal   Collection Time    10/01/12  5:00 AM      Result Value Range   WBC 10.2  4.0 - 10.5 K/uL   RBC 5.58 (*) 3.87 - 5.11 MIL/uL   Hemoglobin 14.9  12.0 - 15.0 g/dL   HCT 29.5  28.4 - 13.2 %   MCV 77.1 (*) 78.0 - 100.0 fL   MCH 26.7  26.0 - 34.0 pg   MCHC 34.7  30.0 - 36.0 g/dL   RDW 44.0  10.2 - 72.5 %   Platelets 309  150 - 400 K/uL  BASIC METABOLIC PANEL     Status: Abnormal   Collection Time    10/01/12  5:00 AM      Result  Value Range   Sodium 136  135 - 145 mEq/L   Potassium 4.0  3.5 - 5.1 mEq/L   Chloride 97  96 - 112 mEq/L   CO2 26  19 - 32 mEq/L   Glucose, Bld 292 (*) 70 - 99 mg/dL   BUN 13  6 - 23 mg/dL   Creatinine, Ser 1.61  0.50 - 1.10 mg/dL   Calcium 9.6  8.4 - 09.6 mg/dL   GFR calc non Af Amer >90  >90 mL/min   GFR calc Af Amer >90  >90 mL/min  GLUCOSE, CAPILLARY     Status: Abnormal   Collection Time    10/01/12  6:40 AM      Result Value Range   Glucose-Capillary 366 (*) 70 - 99 mg/dL    Intake/Output Summary (Last 24 hours) at 10/01/12 0838 Last data filed at 09/30/12 2144  Gross per 24 hour  Intake 831.25 ml  Output   2976 ml  Net -2144.75 ml     ASSESSMENT AND PLAN:  Atrial fibrillation with RVR:  Elevated INR possible lab error.  Now the patient is on Xarelto so we will not check it again.  No bleeding issues DCCV on Monday.   HTN (hypertension):  BP OK. Continue current theray.  Diabetes mellitus:  A1c 13.  She will have a poor outcome if she does not change her compliance. Continue insulin and metformin. She was not taking her metformin prior to admission.  She should follow with endocrine if she would.    Noncompliance:  Educated.    Prolonged Q-T interval on ECG   Rollene Rotunda 10/01/2012 8:38 AM

## 2012-10-01 NOTE — Progress Notes (Signed)
Pt's HR up to 190's nonsustained back down to 140's-160's.  Increased Cardizem dose to 20 mg/hr.  Notified Dr. Antoine Poche ordered to continue to monitor.  Will carry out MD orders.

## 2012-10-01 NOTE — Progress Notes (Signed)
Offered to perform A.M. Care for patient at 0900; patient stated she had already washed herself and performed self oral care. MM

## 2012-10-01 NOTE — Progress Notes (Signed)
P 

## 2012-10-01 NOTE — Care Management Note (Unsigned)
    Page 1 of 1   10/01/2012     5:55:13 PM   CARE MANAGEMENT NOTE 10/01/2012  Patient:  [name],[name]   Account Number:  [id number]  Date Initiated:  10/01/2012  Documentation initiated by:  [facility]  Subjective/Objective Assessment:   adm: rapid heart rate     Action/Plan:   discharge planning   Anticipated DC Date:  10/03/2012   Anticipated DC Plan:  HOME/SELF CARE      DC Planning Services  CM consult      Choice offered to / List presented to:             Status of service:  In process, will continue to follow Medicare Important Message given?   (If response is "NO", the following Medicare IM given date fields will be blank) Date Medicare IM given:   Date Additional Medicare IM given:    Discharge Disposition:    Per UR Regulation:    If discussed at Long Length of Stay Meetings, dates discussed:    Comments:  10/01/12 10:00 CM spoke with pt in room and pt stated she is seen in the [facility] and her primary care physician is Dr. Concepcion Elk, Dorma Russell A. I asked the pt if she made use of the outpt [facility] pharmacy, as she is an Human resources officer.  Pt stated she was aware of the pharmacy and would consider using it for her meds.  Pt stated she has no concerns regarding her follow up or seeing her PCP.  CM gave pt List of PCP referral contact numbers as she is employeed by [employer] and has [employer].  No other Cm needs were communicated.  CM will continue to follow for discharge needs.  Freddy Jaksch, BSN, CM, [phone].

## 2012-10-01 NOTE — Progress Notes (Signed)
CRITICAL VALUE ALERT  Critical value received:  >10.00 INR  Date of notification:  09/30/2012  Time of notification:  1940  Critical value read back: yes  Nurse who received alert:  Lindajo Royal RN  MD notified (1st page):  Theodore Demark, PA-C  Time of first page:  1945  Responding MD:  Theodore Demark, PA-C  Time MD responded:  [phone]

## 2012-10-01 NOTE — Progress Notes (Signed)
UR Completed.  Shantina Chronister Jane [phone] 10/01/2012  

## 2012-10-02 LAB — GLUCOSE, CAPILLARY
Glucose-Capillary: 210 mg/dL — ABNORMAL HIGH (ref 70–99)
Glucose-Capillary: 239 mg/dL — ABNORMAL HIGH (ref 70–99)
Glucose-Capillary: 160 mg/dL — ABNORMAL HIGH (ref 70–99)

## 2012-10-02 NOTE — Progress Notes (Signed)
Patient converted to SR on tel. EKG confirmed. MD Aitsebaomo notified. Cardizem GTT DCed at this time and PO cardizem ordered.

## 2012-10-02 NOTE — Progress Notes (Signed)
   SUBJECTIVE:  No chest pain.  No SOB   PHYSICAL EXAM Filed Vitals:   10/01/12 1400 10/01/12 1455 10/01/12 2011 10/02/12 0434  BP: 127/73 122/73 115/71 106/60  Pulse: 91 159 89 69  Temp: 98.1 F (36.7 C)  98.2 F (36.8 C) 98.2 F (36.8 C)  TempSrc: Oral  Oral Oral  Resp: 20  20 20   Height:      Weight:    185 lb 13.6 oz (84.3 kg)  SpO2: 100%  100% 99%   General:  No distress Lungs:  Clear Heart:  Irregular Abdomen:  Positive bowel sounds, no rebound no guarding Extremities:  No edema  LABS: Lab Results  Component Value Date   TROPONINI <0.30 10/01/2012   Results for orders placed during the hospital encounter of 09/30/12 (from the past 24 hour(s))  GLUCOSE, CAPILLARY     Status: Abnormal   Collection Time    10/01/12 11:38 AM      Result Value Range   Glucose-Capillary 262 (*) 70 - 99 mg/dL   Comment 1 Documented in Chart     Comment 2 Notify RN    GLUCOSE, CAPILLARY     Status: Abnormal   Collection Time    10/01/12  4:29 PM      Result Value Range   Glucose-Capillary 257 (*) 70 - 99 mg/dL   Comment 1 Documented in Chart     Comment 2 Notify RN    GLUCOSE, CAPILLARY     Status: Abnormal   Collection Time    10/01/12  9:01 PM      Result Value Range   Glucose-Capillary 191 (*) 70 - 99 mg/dL  GLUCOSE, CAPILLARY     Status: Abnormal   Collection Time    10/02/12  6:36 AM      Result Value Range   Glucose-Capillary 210 (*) 70 - 99 mg/dL    Intake/Output Summary (Last 24 hours) at 10/02/12 0831 Last data filed at 10/01/12 1700  Gross per 24 hour  Intake    720 ml  Output    576 ml  Net    144 ml     ASSESSMENT AND PLAN:  Atrial fibrillation with RVR:  Elevated INR possible lab error.  Now the patient is on Xarelto so we will not check it again.  Converted on her own to NSR yesterday.  She needs to have an outpatient ETT and trough flecainide level.  She has been non compliant with follow up.    HTN (hypertension):  BP OK. Continue current  theray.  Diabetes mellitus:  A1c 13.  She will have a poor outcome if she does not change her compliance and I discussed this with yer. Continue insulin and metformin. She was not taking her metformin prior to admission.  She should follow with endocrine if she would.  She needs at least to see   Dorrene German, MD soon.    Noncompliance:  Educated.    Prolonged Q-T interval on ECG. Follow up on flecainide as above.   Rollene Rotunda 10/02/2012 8:31 AM

## 2012-10-03 ENCOUNTER — Encounter (HOSPITAL_COMMUNITY): Payer: Self-pay | Admitting: Physician Assistant

## 2012-10-03 ENCOUNTER — Encounter (HOSPITAL_COMMUNITY)
Admission: EM | Disposition: A | Discharge status: Home / Self Care | Payer: Self-pay | Source: Home / Self Care | Attending: Cardiovascular Disease

## 2012-10-03 ENCOUNTER — Other Ambulatory Visit: Payer: Self-pay | Admitting: Physician Assistant

## 2012-10-03 DIAGNOSIS — I4891 Unspecified atrial fibrillation: Secondary | ICD-10-CM

## 2012-10-03 LAB — GLUCOSE, CAPILLARY
Glucose-Capillary: 114 mg/dL — ABNORMAL HIGH (ref 70–99)
Glucose-Capillary: 147 mg/dL — ABNORMAL HIGH (ref 70–99)

## 2012-10-03 SURGERY — ECHOCARDIOGRAM, TRANSESOPHAGEAL
Anesthesia: Moderate Sedation

## 2012-10-03 MED ORDER — DILTIAZEM HCL ER COATED BEADS 120 MG PO CP24: 120.0000 mg | ORAL_CAPSULE | Freq: Every day | ORAL | Status: DC

## 2012-10-03 MED ORDER — RIVAROXABAN 20 MG PO TABS: 20.0000 mg | ORAL_TABLET | Freq: Every day | ORAL | Status: DC

## 2012-10-03 MED ORDER — METFORMIN HCL 500 MG PO TABS: 500.0000 mg | ORAL_TABLET | Freq: Two times a day (BID) | ORAL | Status: DC

## 2012-10-03 NOTE — Discharge Summary (Signed)
Discharge Summary   Patient ID: [name] MRN: [id number], DOB/AGE: 01-26-1970 42 y.o. Admit date: 09/30/2012 D/C date:     10/03/2012  Primary Cardiologist: Eden Emms  Primary Discharge Diagnoses:  1. Paroxysmal atrial fibrillation - readmitted with AF-RVR, spontaneously converted to NSR on diltiazem, flecainide initially held then restarted at discharge (pt had run out of Xarelto prior to admission and had not previously followed up in office for refills/ETT/appt as instructed) - history includes: a) 07/2011 ED visit with AF-> converted with flecainide, never followed-up;  b) 05/2012 Recurrent Afib -echo: EF 65-60%, mildly dil LA. Placed on Xarelto/daily Flecainide but did not follow up for appt/ETT 2. Noncompliance with medication and followup 3. HTN 4. Diabetes mellitus, uncontrolled A1C 13.0 5. QTc prolongation, improved  Hospital Course: Ms. Gable is a 42 y/o F with history of HTN, DM, and PAF who presented to [facility] overnight on 09/30/12 with recurrent atrial fib and marked hyperglycemia. She has h/o PAF dating back to 07/2011 at which time she presented to the ED, received Flecainide 300mg  x 1, converted to sinus, was discharged, and then never followed up with cardiology as instructed. She returned 05/2012 with rapid afib and was placed on Xarelto (CHADSVASC2 = 3) and maintenance flecainide. Post-hospital followup with ETT was arranged but she no-showed. She stated this was because of her kids' schedule. She works in [employer] and has been able to do her job without difficulty recently. She reported being out of Metformin for 1 week. She initially reported compliance with both flecainide and Xarelto. However, her Xarelto was only prescribed for 30 tabs 1 refill in May so should have run out in July - when asked about this further, she admitted to being out of this. She stated she was planning to follow up with her doctor to discuss this. She still upheld  taking flecainide BID.  The evening prior to admission around 11:30pm she noticed her heart begin to race suddenly. It persisted associated with nausea & sweating so EMS was called. She was initially given adenosine 6mg  and 12mg . Rhythm was assessed as atrial fib so she was subsequently treated with IV cardizem. She denied any CP, SOB, presyncope or syncope. She denied any exertional symptoms. She did not think she had had any interim episodes of atrial fib since May. Labs revealed negative troponin, CBG 419, urine hCG negative, otherwise unremarkable. Due to persistently elevated HR, she was admitted to the hospital and maintained on IV cardizem in preparation for possible TEE/DCCV. Although it was felt that this episode of afib had likely begun just the night before, we could not really be sure of the duration of her AF given historical inconsistencies noted above. QTc was slightly prolonged at . Xarelto was restarted and flecainide was temporarily put on hold. INR was checked which was >10, felt to be a lab error, possibly due to Xarelto, and was not rechecked due to the fact it would likely continue to be abnormal on Xarelto. She had no signs of bleeding. A1c resulted as 13 indicating uncontrolled diabetes. She maintained that she had been taking insulin at home prior to admission.   Overnight on 10/02/12, she converted spontaneously to NSR and was changed to oral diltiazem. She remains in NSR today. QTc improved to . Dr. Eden Emms has elected to restart her flecainide. She will need outpatient ETT with trough flecainide level as well as followup in our office. She will also have an EKG in our office next week  to reassess QTc. She will also be plugged into the anticoagulation clinic for periodic monitoring on Xarelto. I have submitted the patient's information to our scheduling department for these follow-up appointments, and our office will call her with this information. Dr. Eden Emms has seen and  examined the patient today and feels she is stable for discharge. She was educated on the utmost importance of followup with PCP due to uncontrolled diabetes (will likely have poor outcome if she does not change her compliance) as well as followup in the cardiology clinic for ETT and visit, especially given flecainide use.  She verbalized understanding of this. Care management was involved in helping the patient obtain PCP services. She identified herself as a pt of Dr. Ginette Otto.  The following information was added to her discharge paperwork to reinforce compliance:  It is EXTREMELY IMPORTANT that you follow up with your primary care doctor for your uncontrolled diabetes. Diabetes can easily lead to serious consequences such as heart attack, stroke, peripheral vascular disease including amputated limbs, kidney failure requiring 4-hour dialysis treatments 3 times a week, blindness, chronic pain from nerve damage, and early death. Please call today to make that appointment.   You absolutely need to follow up in the cardiology office for a treadmill stress test, EKG, labwork and appointment. Our office will be calling you for these appointments. The medication you are taking, flecainide, requires monitoring by a cardiologist to make sure you are safe. You are putting yourself in danger if you do not follow up.  Discharge Vitals: Blood pressure 122/70, pulse 78, temperature 97.8 F (36.6 C), temperature source Oral, resp. rate 20, height 5\' 6"  (1.676 m), weight 186 lb 1.1 oz (84.4 kg), last menstrual period 10/01/2006, SpO2 99.00%.  Labs: Lab Results  Component Value Date   WBC 10.2 10/01/2012   HGB 14.9 10/01/2012   HCT 43.0 10/01/2012   MCV 77.1* 10/01/2012   PLT 309 10/01/2012     Recent Labs Lab 10/01/12 0500  NA 136  K 4.0  CL 97  CO2 26  BUN 13  CREATININE 0.70  CALCIUM 9.6  GLUCOSE 292*    Recent Labs  09/30/12 1825 09/30/12 2215 10/01/12 0459  TROPONINI <0.30 <0.30 <0.30   Lab  Results  Component Value Date   CHOL 100 05/24/2012   HDL 47 05/24/2012   LDLCALC 32 05/24/2012   TRIG 104 05/24/2012     Diagnostic Studies/Procedures   Dg Chest Port 1 View 09/30/2012   CLINICAL DATA:  Atrial fibrillation.  EXAM: PORTABLE CHEST - 1 VIEW  COMPARISON:  Single view of the chest 07/20/2011 and 05/23/2012.  FINDINGS: Heart size is upper normal. There is pulmonary vascular congestion without frank edema. No focal airspace disease or effusion.  IMPRESSION: No acute finding.   Electronically Signed   By: Drusilla Kanner M.D.   On: 09/30/2012 02:25    Discharge Medications     Medication List         diltiazem 120 MG 24 hr capsule  Commonly known as:  CARDIZEM CD  Take 1 capsule (120 mg total) by mouth daily.     flecainide 50 MG tablet  Commonly known as:  TAMBOCOR  Take 1 tablet (50 mg total) by mouth 2 (two) times daily.     insulin aspart 100 UNIT/ML injection  Commonly known as:  novoLOG  Inject 8 Units into the skin 3 (three) times daily with meals.     insulin glargine 100 UNIT/ML injection  Commonly known as:  LANTUS  Inject 0.55 mLs (55 Units total) into the skin at bedtime.     lisinopril 5 MG tablet  Commonly known as:  PRINIVIL,ZESTRIL  Take 5 mg by mouth daily.     metFORMIN 500 MG tablet  Commonly known as:  GLUCOPHAGE  Take 500 mg by mouth 2 (two) times daily with a meal.     Rivaroxaban 20 MG Tabs tablet  Commonly known as:  XARELTO  Take 1 tablet (20 mg total) by mouth daily with supper.        Disposition   The patient will be discharged in stable condition to home. Discharge Orders   Future Orders Complete By Expires   Diet - low sodium heart healthy  As directed    Discharge instructions  As directed    Comments:     It is EXTREMELY IMPORTANT that you follow up with your primary care doctor for your uncontrolled diabetes. Diabetes can easily lead to serious consequences such as heart attacks, stroke, peripheral vascular disease  including amputated limbs, kidney failure requiring 4-hour dialysis treatments 3 times a week, blindness, chronic pain from nerve damage, and early death. Please call today to make that appointment.   You absolutely need to follow up in the cardiology office for a treadmill stress test, EKG, labwork and appointment. Our office will be calling you for these appointments. The medication you are taking, flecainide, requires monitoring by a cardiologist to make sure you are safe. You are putting yourself in danger if you do not follow up.   Increase activity slowly  As directed    Scheduling Instructions:     You may return to work tomorrow if you are feeling well.     Follow-up Information   Follow up with AVBUERE,EDWIN A, MD. (Call today to schedule a followup appointment to discuss your diabetes.)    Specialty:  Internal Medicine   Contact information:   [address] [place] [address] [phone]       Follow up with Charlton Haws, MD. (Our office will be calling you to schedule a repeat EKG with labwork, exercise treadmill stress test, and follow-up appointment. Please call the office if you have not heard from [place] within 3 days.)    Specialty:  Cardiology   Contact information:   [address] [place] [address] [phone]       Follow up with [facility]. (Our office will call you to schedule a visit in our Blood Thinner clinic since you are taking Xarelto.)    Contact information:   [address] [place] [address]         Duration of Discharge Encounter: Greater than 30 minutes including physician and PA time.  Signed, Ronie Spies PA-C 10/03/2012, 9:26 AM

## 2012-10-03 NOTE — Progress Notes (Signed)
Patient ID: [name], female   DOB: 09-05-1970, 42 y.o.   MRN: [id number]   SUBJECTIVE:  No chest pain.  No SOB   PHYSICAL EXAM Filed Vitals:   10/02/12 0930 10/02/12 1300 10/02/12 2040 10/03/12 0431  BP: 113/69 112/73 129/84 122/70  Pulse:  80 79 78  Temp:  98 F (36.7 C) 98.4 F (36.9 C) 97.8 F (36.6 C)  TempSrc:  Oral Oral Oral  Resp:  20 20 20   Height:      Weight:    186 lb 1.1 oz (84.4 kg)  SpO2:  97% 99% 99%   General:  No distress Lungs:  Clear Heart:  Irregular Abdomen:  Positive bowel sounds, no rebound no guarding Extremities:  No edema  Current facility-administered medications:0.9 %  sodium chloride infusion, 250 mL, Intravenous, PRN, Dayna N Dunn, PA-C, Last Rate: 10 mL/hr at 10/01/12 1127, 250 mL at 10/01/12 1127;  acetaminophen (TYLENOL) tablet 650 mg, 650 mg, Oral, Q4H PRN, Dayna N Dunn, PA-C;  diltiazem (CARDIZEM CD) 24 hr capsule 120 mg, 120 mg, Oral, Daily, Vesta Mixer, MD, 120 mg at 10/02/12 0930 insulin aspart (novoLOG) injection 0-9 Units, 0-9 Units, Subcutaneous, TID WC, Dayna N Dunn, PA-C, 2 Units at 10/02/12 1723;  insulin aspart (novoLOG) injection 8 Units, 8 Units, Subcutaneous, TID WC, Dayna N Dunn, PA-C, 8 Units at 10/02/12 1722;  insulin glargine (LANTUS) injection 55 Units, 55 Units, Subcutaneous, QHS, Dayna N Dunn, PA-C, 55 Units at 10/02/12 2129 lisinopril (PRINIVIL,ZESTRIL) tablet 5 mg, 5 mg, Oral, Daily, Dayna N Dunn, PA-C, 5 mg at 10/02/12 0930;  metFORMIN (GLUCOPHAGE) tablet 500 mg, 500 mg, Oral, Q breakfast, Dayna N Dunn, PA-C, 500 mg at 10/03/12 4098;  nitroGLYCERIN (NITROSTAT) SL tablet 0.4 mg, 0.4 mg, Sublingual, Q5 Min x 3 PRN, Dayna N Dunn, PA-C;  ondansetron (ZOFRAN) injection 4 mg, 4 mg, Intravenous, Q6H PRN, Dayna N Dunn, PA-C Rivaroxaban (XARELTO) tablet 20 mg, 20 mg, Oral, Q supper, Dayna N Dunn, PA-C, 20 mg at 10/02/12 1723;  sodium chloride 0.9 % injection 3 mL, 3 mL, Intravenous, Q12H, Dayna N Dunn, PA-C, 3 mL at 10/02/12  2215;  sodium chloride 0.9 % injection 3 mL, 3 mL, Intravenous, PRN, Laurann Montana, PA-C  LABS: Lab Results  Component Value Date   TROPONINI <0.30 10/01/2012   Results for orders placed during the hospital encounter of 09/30/12 (from the past 24 hour(s))  GLUCOSE, CAPILLARY     Status: Abnormal   Collection Time    10/02/12 11:35 AM      Result Value Range   Glucose-Capillary 239 (*) 70 - 99 mg/dL   Comment 1 Documented in Chart     Comment 2 Notify RN    GLUCOSE, CAPILLARY     Status: Abnormal   Collection Time    10/02/12  4:18 PM      Result Value Range   Glucose-Capillary 160 (*) 70 - 99 mg/dL   Comment 1 Documented in Chart     Comment 2 Notify RN    GLUCOSE, CAPILLARY     Status: Abnormal   Collection Time    10/02/12  9:28 PM      Result Value Range   Glucose-Capillary 317 (*) 70 - 99 mg/dL  GLUCOSE, CAPILLARY     Status: Abnormal   Collection Time    10/03/12  5:57 AM      Result Value Range   Glucose-Capillary 114 (*) 70 - 99 mg/dL   Comment 1 Notify  RN      Intake/Output Summary (Last 24 hours) at 10/03/12 0733 Last data filed at 10/02/12 1700  Gross per 24 hour  Intake    720 ml  Output    501 ml  Net    219 ml     ASSESSMENT AND PLAN:  Atrial fibrillation with RVR:  Maint NSR  Continue flecainide and xarelto    HTN (hypertension):  BP OK. Continue current theray.  Diabetes mellitus:  A1c 13.  She will have a poor outcome if she does not change her compliance and I discussed this with yer. Continue insulin and metformin. She was not taking her metformin prior to admission.  She should follow with endocrine if she would.  She needs at least to see   Dorrene German, MD soon.    Noncompliance:  Educated.    Prolonged Q-T interval on ECG. Follow up on flecainide as above.   D/C home today  Charlton Haws 10/03/2012 7:33 AM

## 2012-10-03 NOTE — Progress Notes (Signed)
All d/c instructions explained and given to pt.  Verbalized understanding.  D/C to home, declined w/c service.  Escorted by family members.  Krish Bailly,RN.

## 2012-10-04 ENCOUNTER — Telehealth: Payer: Self-pay | Admitting: Cardiovascular Disease

## 2012-10-04 DIAGNOSIS — I4891 Unspecified atrial fibrillation: Secondary | ICD-10-CM

## 2012-10-04 NOTE — Telephone Encounter (Signed)
Per after hours   EKG 8 Flecainide trough level either Fri 10/3 or Mon 10/6 Next available Ett (dx: atrial fib)  Blood thinner clinic 4 weeks (on xarelto)

## 2012-10-04 NOTE — Telephone Encounter (Signed)
Spoke with pt she will come for an EKG and flecainide through level on Friday 10/07/12 at 11:00 AM. EKG and lab work have been scheduled . Pt aware.

## 2012-10-04 NOTE — Telephone Encounter (Signed)
ETT  Stress test order placed in EPIC. Pt needs an appointment in the coumadin clinic pt is on Xarelto. Staff message for these appointments sent to the [facility].

## 2012-10-04 NOTE — Telephone Encounter (Signed)
P 

## 2012-10-07 ENCOUNTER — Encounter: Payer: Self-pay | Admitting: Cardiovascular Disease

## 2012-10-07 ENCOUNTER — Other Ambulatory Visit: Payer: 59

## 2012-10-07 ENCOUNTER — Ambulatory Visit (INDEPENDENT_AMBULATORY_CARE_PROVIDER_SITE_OTHER): Payer: 59 | Admitting: *Deleted

## 2012-10-07 DIAGNOSIS — I4891 Unspecified atrial fibrillation: Secondary | ICD-10-CM

## 2012-10-07 NOTE — Progress Notes (Signed)
Patient in for EKG (reviewed by Dr. Clifton James), BP 142/82, HR 72. Patient denies any complaints at this time. States she is not having any noted arrhythmias or rate irregularities since her discharge on Monday, 10/03/12, from the hospital.

## 2012-10-12 NOTE — Progress Notes (Signed)
Make sure this was done correctly

## 2012-10-13 LAB — FLECAINIDE LEVEL: Flecainide: <0.1 ug/mL — ABNORMAL LOW (ref 0.20–1.00)

## 2012-10-18 ENCOUNTER — Encounter: Payer: 59 | Admitting: Physician Assistant

## 2012-10-31 ENCOUNTER — Ambulatory Visit (INDEPENDENT_AMBULATORY_CARE_PROVIDER_SITE_OTHER): Payer: 59 | Admitting: Nurse Practitioner

## 2012-10-31 VITALS — BP 153/93 | HR 76

## 2012-10-31 DIAGNOSIS — I4891 Unspecified atrial fibrillation: Secondary | ICD-10-CM

## 2012-10-31 NOTE — Progress Notes (Signed)
Exercise Treadmill Test  Pre-Exercise Testing Evaluation Rhythm: normal sinus  Rate: 76     Test  Exercise Tolerance Test Ordering MD: Charlton Haws, MD  Interpreting MD: Norma Fredrickson, NP  Unique Test No: 1  Treadmill:  1  Indication for ETT: afib/flecainide  Contraindication to ETT: No   Stress Modality: exercise - treadmill  Cardiac Imaging Performed: non   Protocol: standard Bruce - maximal  Max BP:  217/90  Max MPHR (bpm):  179 85% MPR (bpm):  152  MPHR obtained (bpm):  150 % MPHR obtained:  83%  Reached 85% MPHR (min:sec):  NA Total Exercise Time (min-sec):  6 minutes  Workload in METS:  7.0 Borg Scale: 15  Reason ETT Terminated:  patient's desire to stop    ST Segment Analysis At Rest: normal ST segments - no evidence of significant ST depression With Exercise: no evidence of significant ST depression  Other Information Arrhythmia:  No Angina during ETT:  absent (0) Quality of ETT:  diagnostic  ETT Interpretation:  normal - no evidence of ischemia by ST analysis  Comments: Patient presents today for routine GXT. Has had PAF with documented noncompliance. Back on Flecainide and Xarelto due to recent admission.   Today the patient exercised on the standard Bruce protocol for a total of 6 minutes.  Poor exercise tolerance.  Mildly hypertensive blood pressure response. She has had no medicines today.  Clinically negative for chest pain. Test was stopped due to fatigue.  EKG negative for ischemia - some ST upsloping noted - tracings reviewed with Dr. Tenny Craw who agrees. No arrhythmia noted.   Recommendations: Continue with current regimen. Compliance strongly encouraged.   Follow up with Dr. Eden Emms in 4 weeks.   Patient is agreeable to this plan and will call if any problems develop in the interim.   Rosalio Macadamia, RN, ANP-C [facility] [address] [place], [place]  [address]

## 2012-11-12 NOTE — Progress Notes (Signed)
agree

## 2012-11-30 ENCOUNTER — Encounter: Payer: Self-pay | Admitting: Cardiovascular Disease

## 2012-11-30 ENCOUNTER — Ambulatory Visit (INDEPENDENT_AMBULATORY_CARE_PROVIDER_SITE_OTHER): Payer: 59 | Admitting: Cardiovascular Disease

## 2012-11-30 ENCOUNTER — Encounter (INDEPENDENT_AMBULATORY_CARE_PROVIDER_SITE_OTHER): Payer: Self-pay

## 2012-11-30 VITALS — BP 144/102 | HR 72 | Ht 66.0 in | Wt 188.0 lb

## 2012-11-30 DIAGNOSIS — E119 Type 2 diabetes mellitus without complications: Secondary | ICD-10-CM

## 2012-11-30 DIAGNOSIS — I10 Essential (primary) hypertension: Secondary | ICD-10-CM

## 2012-11-30 DIAGNOSIS — I4891 Unspecified atrial fibrillation: Secondary | ICD-10-CM

## 2012-11-30 DIAGNOSIS — R9431 Abnormal electrocardiogram [ECG] [EKG]: Secondary | ICD-10-CM

## 2012-11-30 MED ORDER — FLECAINIDE ACETATE 50 MG PO TABS: 50.0000 mg | ORAL_TABLET | Freq: Two times a day (BID) | ORAL | Status: DC

## 2012-11-30 NOTE — Assessment & Plan Note (Signed)
Discussed low carb diet.  Target hemoglobin A1c is 6.5 or less.  Continue current medications. Checking BS bid at home Seems to be taking it more seriously.  No blurry vision and less polyuria since d/c

## 2012-11-30 NOTE — Assessment & Plan Note (Signed)
Well controlled.  Continue current medications and low sodium Dash type diet.    

## 2012-11-30 NOTE — Assessment & Plan Note (Signed)
Maint NSR on flecainide continue this and anticoagulation with novel agent given recurrent nature of her afib

## 2012-11-30 NOTE — Progress Notes (Signed)
Patient ID: [name], female   DOB: 04-01-1970, 42 y.o.   MRN: [id number] [name] is a 42 y/o F with history of HTN, DM, and PAF who presented to [facility] overnight on 09/30/12 with recurrent atrial fib and marked hyperglycemia. She has h/o PAF dating back to 07/2011 at which time she presented to the ED, received Flecainide 300mg  x 1, converted to sinus, was discharged, and then never followed up with cardiology as instructed. She returned 05/2012 with rapid afib and was placed on Xarelto (CHADSVASC2 = 3) and maintenance flecainide. Post-hospital followup with ETT was arranged but she no-showed. She stated this was because of her kids' schedule. She works in [employer] and has been able to do her job without difficulty recently. She reported being out of Metformin for 1 week. She initially reported compliance with both flecainide and Xarelto. However, her Xarelto was only prescribed for 30 tabs 1 refill in May so should have run out in July - when asked about this further, she admitted to being out of this. She stated she was planning to follow up with her doctor to discuss this. She still upheld taking flecainide BID.  F/U ETT 10/31/12 normal with no proarrhythmia  Needs refill on flecainide  ROS: Denies fever, malais, weight loss, blurry vision, decreased visual acuity, cough, sputum, SOB, hemoptysis, pleuritic pain, palpitaitons, heartburn, abdominal pain, melena, lower extremity edema, claudication, or rash.  All other systems reviewed and negative  General: Affect appropriate Healthy:  appears stated age HEENT: normal Neck supple with no adenopathy JVP normal no bruits no thyromegaly Lungs clear with no wheezing and good diaphragmatic motion Heart:  S1/S2 no murmur, no rub, gallop or click PMI normal Abdomen: benighn, BS positve, no tenderness, no AAA no bruit.  No HSM or HJR Distal pulses intact with no bruits No edema Neuro non-focal Skin warm and  dry No muscular weakness   Current Outpatient Prescriptions  Medication Sig Dispense Refill  . diltiazem (CARDIZEM CD) 120 MG 24 hr capsule Take 1 capsule (120 mg total) by mouth daily.  30 capsule  3  . flecainide (TAMBOCOR) 50 MG tablet Take 1 tablet (50 mg total) by mouth 2 (two) times daily.  60 tablet  11  . insulin aspart (NOVOLOG) 100 UNIT/ML injection Inject 8 Units into the skin 3 (three) times daily with meals.  1 vial  12  . insulin glargine (LANTUS) 100 UNIT/ML injection Inject 0.55 mLs (55 Units total) into the skin at bedtime.  10 mL  12  . lisinopril (PRINIVIL,ZESTRIL) 5 MG tablet Take 5 mg by mouth daily.      . metFORMIN (GLUCOPHAGE) 500 MG tablet Take 1 tablet (500 mg total) by mouth 2 (two) times daily with a meal.  60 tablet  0  . Rivaroxaban (XARELTO) 20 MG TABS tablet Take 1 tablet (20 mg total) by mouth daily with supper.  30 tablet  1   No current facility-administered medications for this visit.    Allergies  Review of patient's allergies indicates no known allergies.  Electrocardiogram: 10/07/12 SR rate 72 normal   Assessment and Plan

## 2012-11-30 NOTE — Assessment & Plan Note (Signed)
Resolved with no proarrhythmia on ETT

## 2012-11-30 NOTE — Assessment & Plan Note (Signed)
>>  ASSESSMENT AND PLAN FOR ESSENTIAL HYPERTENSION WRITTEN ON 11/30/2012 11:12 AM BY DELFORD MAUDE BROCKS, MD  Well controlled.  Continue current medications and low sodium Dash type diet.

## 2012-11-30 NOTE — Patient Instructions (Signed)
The current medical regimen is effective;  continue present plan and medications.  Follow up in 6 months with Dr Nishan.  You will receive a letter in the mail 2 months before you are due.  Please call us when you receive this letter to schedule your follow up appointment.   

## 2012-12-29 ENCOUNTER — Inpatient Hospital Stay (HOSPITAL_COMMUNITY)
Admission: EM | Admit: 2012-12-29 | Discharge: 2012-12-30 | DRG: 310 | Disposition: A | Discharge status: Home / Self Care | Payer: 59 | Attending: Internal Medicine | Admitting: Internal Medicine

## 2012-12-29 ENCOUNTER — Encounter (HOSPITAL_COMMUNITY): Payer: Self-pay | Admitting: Emergency Medicine

## 2012-12-29 ENCOUNTER — Emergency Department (HOSPITAL_COMMUNITY): Payer: 59

## 2012-12-29 DIAGNOSIS — Z794 Long term (current) use of insulin: Secondary | ICD-10-CM

## 2012-12-29 DIAGNOSIS — IMO0001 Reserved for inherently not codable concepts without codable children: Secondary | ICD-10-CM | POA: Diagnosis present

## 2012-12-29 DIAGNOSIS — I4891 Unspecified atrial fibrillation: Principal | ICD-10-CM | POA: Diagnosis present

## 2012-12-29 DIAGNOSIS — Z825 Family history of asthma and other chronic lower respiratory diseases: Secondary | ICD-10-CM

## 2012-12-29 DIAGNOSIS — I10 Essential (primary) hypertension: Secondary | ICD-10-CM | POA: Diagnosis present

## 2012-12-29 DIAGNOSIS — E119 Type 2 diabetes mellitus without complications: Secondary | ICD-10-CM

## 2012-12-29 DIAGNOSIS — Z9119 Patient's noncompliance with other medical treatment and regimen: Secondary | ICD-10-CM

## 2012-12-29 DIAGNOSIS — E2609 Other primary hyperaldosteronism: Secondary | ICD-10-CM | POA: Diagnosis present

## 2012-12-29 DIAGNOSIS — Z91199 Patient's noncompliance with other medical treatment and regimen due to unspecified reason: Secondary | ICD-10-CM

## 2012-12-29 DIAGNOSIS — E1169 Type 2 diabetes mellitus with other specified complication: Secondary | ICD-10-CM | POA: Diagnosis present

## 2012-12-29 DIAGNOSIS — Z7901 Long term (current) use of anticoagulants: Secondary | ICD-10-CM

## 2012-12-29 DIAGNOSIS — Z79899 Other long term (current) drug therapy: Secondary | ICD-10-CM

## 2012-12-29 LAB — RAPID URINE DRUG SCREEN, HOSP PERFORMED
Barbiturates: NOT DETECTED
Benzodiazepines: NOT DETECTED

## 2012-12-29 LAB — CBC WITH DIFFERENTIAL/PLATELET
Basophils Absolute: 0 10*3/uL (ref 0.0–0.1)
Basophils Absolute: 0 10*3/uL (ref 0.0–0.1)
Basophils Relative: 0 % (ref 0–1)
Basophils Relative: 0 % (ref 0–1)
Eosinophils Absolute: 0.1 10*3/uL (ref 0.0–0.7)
Eosinophils Absolute: 0 10*3/uL (ref 0.0–0.7)
Eosinophils Relative: 1 % (ref 0–5)
HCT: 38.5 % (ref 36.0–46.0)
Lymphocytes Relative: 45 % (ref 12–46)
Lymphs Abs: 2.6 10*3/uL (ref 0.7–4.0)
MCH: 24.8 pg — ABNORMAL LOW (ref 26.0–34.0)
MCHC: 33 g/dL (ref 30.0–36.0)
MCV: 77.4 fL — ABNORMAL LOW (ref 78.0–100.0)
Monocytes Absolute: 0.6 10*3/uL (ref 0.1–1.0)
Neutro Abs: 4.7 10*3/uL (ref 1.7–7.7)
Neutro Abs: 4.8 10*3/uL (ref 1.7–7.7)
Neutrophils Relative %: 47 % (ref 43–77)
Neutrophils Relative %: 59 % (ref 43–77)
Platelets: 304 10*3/uL (ref 150–400)
Platelets: 265 10*3/uL (ref 150–400)
RBC: 5.09 MIL/uL (ref 3.87–5.11)
RDW: 14.2 % (ref 11.5–15.5)
RDW: 14.1 % (ref 11.5–15.5)
WBC: 10 10*3/uL (ref 4.0–10.5)

## 2012-12-29 LAB — BASIC METABOLIC PANEL
CO2: 26 mEq/L (ref 19–32)
Calcium: 9.2 mg/dL (ref 8.4–10.5)
Chloride: 96 mEq/L (ref 96–112)
Creatinine, Ser: 0.64 mg/dL (ref 0.50–1.10)
GFR calc non Af Amer: >90 mL/min (ref >90)
GFR calc non Af Amer: >90 mL/min (ref >90)
Glucose, Bld: 403 mg/dL — ABNORMAL HIGH (ref 70–99)
Potassium: 3.6 mEq/L (ref 3.5–5.1)
Sodium: 135 mEq/L (ref 135–145)
Sodium: 136 mEq/L (ref 135–145)

## 2012-12-29 LAB — HEMOGLOBIN A1C
Hgb A1c MFr Bld: 11.9 % — ABNORMAL HIGH (ref <5.7)
Mean Plasma Glucose: 295 mg/dL — ABNORMAL HIGH (ref <117)

## 2012-12-29 LAB — GLUCOSE, CAPILLARY
Glucose-Capillary: 267 mg/dL — ABNORMAL HIGH (ref 70–99)
Glucose-Capillary: 207 mg/dL — ABNORMAL HIGH (ref 70–99)
Glucose-Capillary: 309 mg/dL — ABNORMAL HIGH (ref 70–99)

## 2012-12-29 LAB — PROTIME-INR
INR: 1.07 (ref 0.00–1.49)
Prothrombin Time: 13.7 seconds (ref 11.6–15.2)

## 2012-12-29 LAB — TROPONIN I
Troponin I: <0.3 ng/mL (ref <0.30)
Troponin I: <0.3 ng/mL (ref <0.30)

## 2012-12-29 LAB — PREGNANCY, URINE: Preg Test, Ur: NEGATIVE

## 2012-12-29 LAB — POCT I-STAT TROPONIN I

## 2012-12-29 LAB — TSH: TSH: 0.895 u[IU]/mL (ref 0.350–4.500)

## 2012-12-29 MED ORDER — FLECAINIDE ACETATE 50 MG PO TABS
50.0000 mg | ORAL_TABLET | Freq: Two times a day (BID) | ORAL | Status: DC
Start: 2012-12-29 — End: 2012-12-30
  Administered 2012-12-29 – 2012-12-30 (×3): 50 mg via ORAL
  Filled 2012-12-29 (×4): qty 1

## 2012-12-29 MED ORDER — SODIUM CHLORIDE 0.9 % IV BOLUS (SEPSIS)
1000.0000 mL | Freq: Once | INTRAVENOUS | Status: AC
  Administered 2012-12-29: 1000 mL via INTRAVENOUS

## 2012-12-29 MED ORDER — DILTIAZEM HCL 100 MG IV SOLR
5.0000 mg/h | INTRAVENOUS | Status: AC
  Administered 2012-12-29 (×2): 15 mg/h via INTRAVENOUS

## 2012-12-29 MED ORDER — SODIUM CHLORIDE 0.9 % IJ SOLN: 3.0000 mL | Freq: Two times a day (BID) | INTRAMUSCULAR | Status: DC

## 2012-12-29 MED ORDER — ONDANSETRON HCL 4 MG PO TABS: 4.0000 mg | ORAL_TABLET | Freq: Four times a day (QID) | ORAL | Status: DC | PRN

## 2012-12-29 MED ORDER — DILTIAZEM HCL 30 MG PO TABS
30.0000 mg | ORAL_TABLET | Freq: Four times a day (QID) | ORAL | Status: AC
  Administered 2012-12-29: 30 mg via ORAL
  Filled 2012-12-29 (×3): qty 1

## 2012-12-29 MED ORDER — SODIUM CHLORIDE 0.9 % IJ SOLN
3.0000 mL | Freq: Two times a day (BID) | INTRAMUSCULAR | Status: DC
  Administered 2012-12-29 – 2012-12-30 (×2): 3 mL via INTRAVENOUS

## 2012-12-29 MED ORDER — DILTIAZEM HCL ER COATED BEADS 120 MG PO CP24
120.0000 mg | ORAL_CAPSULE | Freq: Every day | ORAL | Status: DC
  Administered 2012-12-30: 120 mg via ORAL
  Filled 2012-12-29: qty 1

## 2012-12-29 MED ORDER — DILTIAZEM HCL 25 MG/5ML IV SOLN
20.0000 mg | Freq: Once | INTRAVENOUS | Status: AC
  Administered 2012-12-29: 15 mg via INTRAVENOUS

## 2012-12-29 MED ORDER — RIVAROXABAN 20 MG PO TABS
20.0000 mg | ORAL_TABLET | Freq: Every day | ORAL | Status: DC
Start: 2012-12-29 — End: 2012-12-30
  Administered 2012-12-29: 20 mg via ORAL
  Filled 2012-12-29 (×2): qty 1

## 2012-12-29 MED ORDER — ACETAMINOPHEN 650 MG RE SUPP: 650.0000 mg | Freq: Four times a day (QID) | RECTAL | Status: DC | PRN

## 2012-12-29 MED ORDER — DILTIAZEM HCL 30 MG PO TABS: 30.0000 mg | ORAL_TABLET | Freq: Four times a day (QID) | ORAL | Status: DC

## 2012-12-29 MED ORDER — LISINOPRIL 5 MG PO TABS
5.0000 mg | ORAL_TABLET | Freq: Every day | ORAL | Status: DC
Start: 2012-12-29 — End: 2012-12-30
  Administered 2012-12-29 – 2012-12-30 (×2): 5 mg via ORAL
  Filled 2012-12-29 (×2): qty 1

## 2012-12-29 MED ORDER — INSULIN ASPART 100 UNIT/ML ~~LOC~~ SOLN
0.0000 [IU] | Freq: Three times a day (TID) | SUBCUTANEOUS | Status: DC
  Administered 2012-12-29: 5 [IU] via SUBCUTANEOUS
  Administered 2012-12-29: 3 [IU] via SUBCUTANEOUS
  Administered 2012-12-29 – 2012-12-30 (×2): 5 [IU] via SUBCUTANEOUS

## 2012-12-29 MED ORDER — ONDANSETRON HCL 4 MG/2ML IJ SOLN: 4.0000 mg | Freq: Four times a day (QID) | INTRAMUSCULAR | Status: DC | PRN

## 2012-12-29 MED ORDER — INSULIN GLARGINE 100 UNIT/ML ~~LOC~~ SOLN
55.0000 [IU] | Freq: Every day | SUBCUTANEOUS | Status: DC
  Administered 2012-12-29 – 2012-12-30 (×2): 55 [IU] via SUBCUTANEOUS
  Filled 2012-12-29 (×2): qty 0.55

## 2012-12-29 MED ORDER — ACETAMINOPHEN 325 MG PO TABS: 650.0000 mg | ORAL_TABLET | Freq: Four times a day (QID) | ORAL | Status: DC | PRN

## 2012-12-29 NOTE — H&P (Signed)
[facility] History and Physical  [name] WUJ:[id number] DOB: 1971/01/04 DOA: 12/29/2012  Referring physician: ER physician. PCP: Dorrene German, MD  Specialists: Dr. Charlton Haws.  Chief Complaint: Palpitations.  HPI: [name] is a 42 y.o. female history of atrial fibrillation started experiencing palpitations last midnight. Patient was going to the bathroom when suddenly she started having palpitations. When she tried to walk she almost passed out. Denies any chest pain or shortness of breath. She came to the ER and she was found to be in A. fib with RVR. Patient was started on Cardizem infusion. Patient's medication list shows that she has not been on Cardizem orally. Patient otherwise denies any nausea vomiting abdominal pain headache focal deficits. Her blood sugar has been high and she states that she has not taken her Lantus last night.   Review of Systems: As presented in the history of presenting illness, rest negative.  Past Medical History  Diagnosis Date  . Diabetes mellitus     a. 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 b. 05/2012 A1c 12.6.  . Hypertension   . PAF (paroxysmal atrial fibrillation)     a. 07/2011 ED visit with AF-> converted with flecainide, never followed-up;  b. 05/2012 Recurrent Afib -echo: EF 65-60%, mildly dil LA. Placed on Xarelto/daily Flecainide. c. 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  . Noncompliance   . History of prolonged Q-T interval on ECG     a. 09/2012 adm: QTC 506.   Past Surgical History  Procedure Laterality Date  . Cesarean section    . Hernia repair     Social History:  reports that she has never smoked. She has never used smokeless tobacco. She reports that she does not drink alcohol or use illicit drugs. Where does patient live home. Can patient participate in ADLs? Yes.  No Known Allergies  Family History:  Family History  Problem Relation Age of Onset  . Other Father      killed in [place] civil war when he was in his 78's  . Asthma Mother     alive in her 47's  . Other Sister     A & W  . Other Sister     A & W  . Heart disease Neg Hx       Prior to Admission medications   Medication Sig Start Date End Date Taking? Authorizing Provider  flecainide (TAMBOCOR) 50 MG tablet Take 1 tablet (50 mg total) by mouth 2 (two) times daily. 11/30/12  Yes Wendall Stade, MD  insulin aspart (NOVOLOG) 100 UNIT/ML injection Inject 8 Units into the skin 3 (three) times daily with meals. 05/24/12  Yes Calvert Cantor, MD  insulin glargine (LANTUS) 100 UNIT/ML injection Inject 0.55 mLs (55 Units total) into the skin at bedtime. 05/24/12  Yes Calvert Cantor, MD  lisinopril (PRINIVIL,ZESTRIL) 5 MG tablet Take 5 mg by mouth daily.   Yes Historical Provider, MD  metFORMIN (GLUCOPHAGE) 500 MG tablet Take 1 tablet (500 mg total) by mouth 2 (two) times daily with a meal. 10/03/12  Yes Dayna N Dunn, PA-C  Rivaroxaban (XARELTO) 20 MG TABS tablet Take 1 tablet (20 mg total) by mouth daily with supper. 10/03/12  Yes Laurann Montana, PA-C    Physical Exam: Filed Vitals:   12/29/12 0430 12/29/12 0442 12/29/12 0500 12/29/12 0600  BP: 134/77 134/77 129/79 149/97  Pulse: 114 104 95 89  Temp:   98.5 F (36.9 C)   TempSrc:  Oral   Resp: 20 20 22    Height:    5\' 6"  (1.676 m)  Weight:    86.2 kg (190 lb 0.6 oz)  SpO2: 96% 95% 95% 99%     General:  Well-developed and nourished.  Eyes: Anicteric no pallor.  ENT: No discharge from the ears eyes nose mouth.  Neck: No mass felt.  Cardiovascular: S1-S2 heard.  Respiratory: No rhonchi or crepitations.  Abdomen: Soft nontender bowel sounds present.  Skin: No rash.  Musculoskeletal: No edema.  Psychiatric: Appears normal.  Neurologic: Alert oriented to time place and person. Moves all extremities.  Labs on Admission:  Basic Metabolic Panel:  Recent Labs Lab 12/29/12 0231  NA 135  K 3.6  CL 96  CO2 26  GLUCOSE 366*  BUN 13   CREATININE 0.76  CALCIUM 9.4   Liver Function Tests: No results found for this basename: AST, ALT, ALKPHOS, BILITOT, PROT, ALBUMIN,  in the last 168 hours No results found for this basename: LIPASE, AMYLASE,  in the last 168 hours No results found for this basename: AMMONIA,  in the last 168 hours CBC:  Recent Labs Lab 12/29/12 0231  WBC 10.0  NEUTROABS 4.7  HGB 12.7  HCT 38.5  MCV 78.1  PLT 304   Cardiac Enzymes: No results found for this basename: CKTOTAL, CKMB, CKMBINDEX, TROPONINI,  in the last 168 hours  BNP (last 3 results) No results found for this basename: PROBNP,  in the last 8760 hours CBG: No results found for this basename: GLUCAP,  in the last 168 hours  Radiological Exams on Admission: Dg Chest Portable 1 View  12/29/2012   CLINICAL DATA:  Palpitations.  EXAM: PORTABLE CHEST - 1 VIEW  COMPARISON:  Chest radiograph performed 09/30/2012  FINDINGS: The lungs are well-aerated and clear. There is no evidence of focal opacification, pleural effusion or pneumothorax.  The cardiomediastinal silhouette is borderline normal in size. No acute osseous abnormalities are seen.  IMPRESSION: No acute cardiopulmonary process seen.   Electronically Signed   By: Roanna Raider M.D.   On: 12/29/2012 05:14    EKG: Independently reviewed. A. fib with RVR.  Assessment/Plan Principal Problem:   Atrial fibrillation with RVR Active Problems:   HTN (hypertension)   Diabetes mellitus   1. Atrial fibrillation with RVR - presently have continued patient on Cardizem infusion. Once patient takes her dose of flecainide and may also discuss with patient's cardiologist if patient needs to be on hold Cardizem which if needs to be started then following which Cardizem infusion can be tapered off. Check TSH. 2. Hypertension - continue lisinopril. 3. Uncontrolled diabetes - patient states she has not taken Lantus last night. I have ordered a dose for now. Closely follow CBGs recheck hemoglobin  A1c.    Code Status: Full code.  Family Communication: None.  Disposition Plan: Admit to inpatient.    Tamirah George N. [facility] Pager [phone].  If 7PM-7AM, please contact night-coverage [email or web address] Password [facility] 12/29/2012, 6:29 AM

## 2012-12-29 NOTE — Progress Notes (Signed)
Patient arrived from ED via stretcher and is alert and oriented x4.  Denies pain at this time.  Skin assessment completed.  No areas of pressure noted. cardizem gtt infusing per order.  Physical assessment complete (see doc flow sheet).

## 2012-12-29 NOTE — ED Provider Notes (Signed)
CSN: [id number]     Arrival date & time 12/29/12  0208 History   First MD Initiated Contact with Patient 12/29/12 [phone]     Chief Complaint  Patient presents with  . Palpitations   (Consider location/radiation/quality/duration/timing/severity/associated sxs/prior Treatment) HPI Patient is a 42 yo woman yo [place] American woman with a history of atrial fibrillation treated with flecainide 50mg  qd and diltiazem 120mg  qd. The patient presents with fast and irregular heart beat which began at 0130 and awoke her from sleep. She denies CP and SOB. She had a near syncopal episode in the waiting room. She reports compliance with all medications. Denies illicit drug and alcohol use. Reports normal po intake and no antecedant illness.   Past Medical History  Diagnosis Date  . Diabetes mellitus     a. 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 b. 05/2012 A1c 12.6.  . Hypertension   . PAF (paroxysmal atrial fibrillation)     a. 07/2011 ED visit with AF-> converted with flecainide, never followed-up;  b. 05/2012 Recurrent Afib -echo: EF 65-60%, mildly dil LA. Placed on Xarelto/daily Flecainide. c. 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  . Noncompliance   . History of prolonged Q-T interval on ECG     a. 09/2012 adm: QTC 506.   Past Surgical History  Procedure Laterality Date  . Cesarean section    . Hernia repair     Family History  Problem Relation Age of Onset  . Other Father     killed in [place] civil war when he was in his 48's  . Asthma Mother     alive in her 49's  . Other Sister     A & W  . Other Sister     A & W  . Heart disease Neg Hx    History  Substance Use Topics  . Smoking status: Never Smoker   . Smokeless tobacco: Never Used  . Alcohol Use: No   OB History   Grav Para Term Preterm Abortions TAB SAB Ect Mult Living                 Review of Systems 10 point ROS performed and is negative with the exception of sx noted above.    Allergies  Review of patient's allergies indicates no known allergies.  Home Medications   Current Outpatient Rx  Name  Route  Sig  Dispense  Refill  . diltiazem (CARDIZEM CD) 120 MG 24 hr capsule   Oral   Take 1 capsule (120 mg total) by mouth daily.   30 capsule   3   . flecainide (TAMBOCOR) 50 MG tablet   Oral   Take 1 tablet (50 mg total) by mouth 2 (two) times daily.   60 tablet   11   . insulin aspart (NOVOLOG) 100 UNIT/ML injection   Subcutaneous   Inject 8 Units into the skin 3 (three) times daily with meals.   1 vial   12   . insulin glargine (LANTUS) 100 UNIT/ML injection   Subcutaneous   Inject 0.55 mLs (55 Units total) into the skin at bedtime.   10 mL   12   . lisinopril (PRINIVIL,ZESTRIL) 5 MG tablet   Oral   Take 5 mg by mouth daily.         . metFORMIN (GLUCOPHAGE) 500 MG tablet   Oral   Take 1 tablet (500 mg total) by mouth 2 (two) times daily with a  meal.   60 tablet   0   . Rivaroxaban (XARELTO) 20 MG TABS tablet   Oral   Take 1 tablet (20 mg total) by mouth daily with supper.   30 tablet   1    There were no vitals taken for this visit. Physical Exam Gen: well developed and well nourished appearing Head: NCAT Eyes: PERL, EOMI Nose: no epistaixis or rhinorrhea Mouth/throat: mucosa is moist and pink Neck: supple, no stridor Lungs: CTA B, no wheezing, rhonchi or rales, RR 28/min CV: Rapid and irregular, rate in 140s to 160s, no murmur, extremities appear well perfused with good pedal pulses Abd: soft, notender, nondistended Back: no ttp, no cva ttp, normal to inspection Skin: warm and dry Ext: no edema, normal to inspection Neuro: CN ii-xii grossly intact, no focal deficits Psyche; normal affect,  calm and cooperative.   ED Course  Procedures (including critical care time) Results for orders placed during the hospital encounter of 12/29/12 (from the past 24 hour(s))  CBC WITH DIFFERENTIAL     Status: Abnormal   Collection  Time    12/29/12  2:31 AM      Result Value Range   WBC 10.0  4.0 - 10.5 K/uL   RBC 4.93  3.87 - 5.11 MIL/uL   Hemoglobin 12.7  12.0 - 15.0 g/dL   HCT 40.9  81.1 - 91.4 %   MCV 78.1  78.0 - 100.0 fL   MCH 25.8 (*) 26.0 - 34.0 pg   MCHC 33.0  30.0 - 36.0 g/dL   RDW 78.2  95.6 - 21.3 %   Platelets 304  150 - 400 K/uL   Neutrophils Relative % 47  43 - 77 %   Neutro Abs 4.7  1.7 - 7.7 K/uL   Lymphocytes Relative 45  12 - 46 %   Lymphs Abs 4.6 (*) 0.7 - 4.0 K/uL   Monocytes Relative 6  3 - 12 %   Monocytes Absolute 0.6  0.1 - 1.0 K/uL   Eosinophils Relative 1  0 - 5 %   Eosinophils Absolute 0.1  0.0 - 0.7 K/uL   Basophils Relative 0  0 - 1 %   Basophils Absolute 0.0  0.0 - 0.1 K/uL  BASIC METABOLIC PANEL     Status: Abnormal   Collection Time    12/29/12  2:31 AM      Result Value Range   Sodium 135  135 - 145 mEq/L   Potassium 3.6  3.5 - 5.1 mEq/L   Chloride 96  96 - 112 mEq/L   CO2 26  19 - 32 mEq/L   Glucose, Bld 366 (*) 70 - 99 mg/dL   BUN 13  6 - 23 mg/dL   Creatinine, Ser 0.86  0.50 - 1.10 mg/dL   Calcium 9.4  8.4 - 57.8 mg/dL   GFR calc non Af Amer >90  >90 mL/min   GFR calc Af Amer >90  >90 mL/min  PROTIME-INR     Status: None   Collection Time    12/29/12  2:31 AM      Result Value Range   Prothrombin Time 13.7  11.6 - 15.2 seconds   INR 1.07  0.00 - 1.49  POCT I-STAT TROPONIN I     Status: None   Collection Time    12/29/12  2:36 AM      Result Value Range   Troponin i, poc 0.01  0.00 - 0.08 ng/mL   Comment 3  EKG: Atrial rate with rapid ventricular response, normal axis, no acute ischemic changes, no STEMI  MDM   1. Atrial fibrillation with rapid ventricular response    We are fluid resuscitating with IVF and treating with diltiazem bolus which will be followed by continuous infusion. Anticipate admission. The patient is anticoagulated with Xarelto.   3244: Labs are unremarkable. Good response to diltiazem bolus followed by gtt. Hospitalist  paged to request admission.   0406: Case discussed with Dr. Toniann Fail who has accepted the patient to the SDU.   CRITICAL CARE Performed by: Brandt Loosen   Total critical care time: 71m  Critical care time was exclusive of separately billable procedures and treating other patients.  Critical care was necessary to treat or prevent imminent or life-threatening deterioration.  Critical care was time spent personally by me on the following activities: development of treatment plan with patient and/or surrogate as well as nursing, discussions with consultants, evaluation of patient's response to treatment, examination of patient, obtaining history from patient or surrogate, ordering and performing treatments and interventions, ordering and review of laboratory studies, ordering and review of radiographic studies, pulse oximetry and re-evaluation of patient's condition.     Brandt Loosen, MD 12/29/12 [phone]

## 2012-12-29 NOTE — Progress Notes (Signed)
[facility] Progress Note [place] TEAM 1 - Stepdown/ICU TEAM   Idell Pickles ZOX:[id number] DOB: 15-Sep-1970 DOA: 12/29/2012 PCP: Dorrene German, MD  Admit HPI / Brief Narrative: 42 y.o. female with history of atrial fibrillation started experiencing palpitations around midnight. Patient was going to the bathroom when suddenly she started having palpitations. When she tried to walk she almost passed out. Denied chest pain or shortness of breath.   She came to the ER and was found to be in A. fib with RVR. Patient was started on Cardizem infusion. Patient's office visit 11/26 confirms pt was presribed cardizem CD, as well as flecainide, and Xarelto.  HPI/Subjective: Pt seen for f/u visit.  Assessment/Plan:  Chronic afib w/ acute RVR h/o PAF dating back to 07/2011 at which time she presented to the ED, received Flecainide 300mg  x 1, converted to sinus, was discharged, and then never followed up with cardiology as instructed - admitted again 05/2012 with rapid afib and was placed on Xarelto (CHADSVASC2 = 3) and maintenance flecainide;  post-hospital followup with Cards was arranged but she no-showed - she subsequently was admitted to the Cardiology service 09/30/2012 for recurrent RVR and spontaneously converted on cardizem, after wch she was d/c on cardizem, flecainide, and Xarelto - she did present for a clinic f/u visit on 11/26 w/ Dr. Eden Emms - pt admits to not taking cardizem because she "ran out"  DM2  HTN  Hx of prolonged QTc  Hx of noncompliance   Code Status: FULL Family Communication: no family present at time of exam Disposition Plan: SDU - possible d/c 12/26  Consultants: none  Procedures: none  Antibiotics: none  DVT prophylaxis: Xarelto  Objective: Blood pressure 143/101, pulse 87, temperature 98.3 F (36.8 C), temperature source Oral, resp. rate 22, height 5\' 6"  (1.676 m), weight 86.2 kg (190 lb 0.6 oz), SpO2 93.00%. No intake or output data in the 24  hours ending 12/29/12 1059  Exam: F/U exam completed  Data Reviewed: Basic Metabolic Panel:  Recent Labs Lab 12/29/12 0231 12/29/12 0935  NA 135 136  K 3.6 4.0  CL 96 95*  CO2 26 23  GLUCOSE 366* 403*  BUN 13 9  CREATININE 0.76 0.64  CALCIUM 9.4 9.2   Liver Function Tests: No results found for this basename: AST, ALT, ALKPHOS, BILITOT, PROT, ALBUMIN,  in the last 168 hours  CBC:  Recent Labs Lab 12/29/12 0231 12/29/12 0935  WBC 10.0 8.2  NEUTROABS 4.7 4.8  HGB 12.7 12.6  HCT 38.5 39.4  MCV 78.1 77.4*  PLT 304 265   Cardiac Enzymes:  Recent Labs Lab 12/29/12 0935  TROPONINI <0.30   CBG:  Recent Labs Lab 12/29/12 0731  GLUCAP 267*    Recent Results (from the past 240 hour(s))  MRSA PCR SCREENING     Status: None   Collection Time    12/29/12  5:25 AM      Result Value Range Status   MRSA by PCR NEGATIVE  NEGATIVE Final   Comment:            The GeneXpert MRSA Assay (FDA     approved for NASAL specimens     only), is one component of a     comprehensive MRSA colonization     surveillance program. It is not     intended to diagnose MRSA     infection nor to guide or     monitor treatment for     MRSA infections.  Studies:  Recent x-ray studies have been reviewed in detail by the Attending Physician  Scheduled Meds:  Scheduled Meds: . flecainide  50 mg Oral BID  . insulin aspart  0-9 Units Subcutaneous TID WC  . insulin glargine  55 Units Subcutaneous Daily  . lisinopril  5 mg Oral Daily  . Rivaroxaban  20 mg Oral Q supper  . sodium chloride  3 mL Intravenous Q12H  . sodium chloride  3 mL Intravenous Q12H    Time spent on care of this patient: 25+ mins   [facility] [place] T  [facility] Office  [phone] Pager - Text Page per Loretha Stapler as per below:  On-Call/Text Page:      [email or web address]      password [email or web address]  If 7PM-7AM, please contact night-coverage [email or web address] Password [email or web address] 12/29/2012, 10:59 AM   LOS: 0 days

## 2012-12-29 NOTE — ED Notes (Signed)
Pt reports palpitations. Reports this has happened before in past and she had to get some kind of medicine but does not remember what it was.

## 2012-12-30 LAB — CBC
HCT: 40 % (ref 36.0–46.0)
Hemoglobin: 13 g/dL (ref 12.0–15.0)
MCHC: 32.5 g/dL (ref 30.0–36.0)
MCV: 78.6 fL (ref 78.0–100.0)
RBC: 5.09 MIL/uL (ref 3.87–5.11)
RDW: 14.2 % (ref 11.5–15.5)
WBC: 9.5 10*3/uL (ref 4.0–10.5)

## 2012-12-30 LAB — BASIC METABOLIC PANEL
BUN: 13 mg/dL (ref 6–23)
CO2: 27 mEq/L (ref 19–32)
Chloride: 100 mEq/L (ref 96–112)
GFR calc Af Amer: >90 mL/min (ref >90)
Potassium: 3.8 mEq/L (ref 3.5–5.1)
Sodium: 140 mEq/L (ref 135–145)

## 2012-12-30 LAB — GLUCOSE, CAPILLARY: Glucose-Capillary: 256 mg/dL — ABNORMAL HIGH (ref 70–99)

## 2012-12-30 MED ORDER — METFORMIN HCL 500 MG PO TABS: 500.0000 mg | ORAL_TABLET | Freq: Two times a day (BID) | ORAL | Status: DC

## 2012-12-30 MED ORDER — LISINOPRIL 5 MG PO TABS: 5.0000 mg | ORAL_TABLET | Freq: Every day | ORAL | Status: DC

## 2012-12-30 MED ORDER — INSULIN ASPART 100 UNIT/ML ~~LOC~~ SOLN: 8.0000 [IU] | Freq: Three times a day (TID) | SUBCUTANEOUS | Status: DC

## 2012-12-30 MED ORDER — FLECAINIDE ACETATE 50 MG PO TABS: 50.0000 mg | ORAL_TABLET | Freq: Two times a day (BID) | ORAL | Status: DC

## 2012-12-30 MED ORDER — INSULIN GLARGINE 100 UNIT/ML ~~LOC~~ SOLN: 55.0000 [IU] | Freq: Every day | SUBCUTANEOUS | Status: DC

## 2012-12-30 MED ORDER — RIVAROXABAN 20 MG PO TABS: 20.0000 mg | ORAL_TABLET | Freq: Every day | ORAL | Status: DC

## 2012-12-30 NOTE — Discharge Summary (Signed)
DISCHARGE SUMMARY  [name]  MR#: [id number]  DOB:1970/11/25  Date of Admission: 12/29/2012 Date of Discharge: 12/30/2012  Attending Physician:MCCLUNG,JEFFREY T  Patient's WUJ:WJXBJYN,WGNFA A, MD  Consults:  none  Disposition: D/C home   Follow-up Appts:     Follow-up Information   Follow up with AVBUERE,EDWIN A, MD. Schedule an appointment as soon as possible for a visit in 5 days.   Specialty:  Internal Medicine   Contact information:   [address] [place] [address] [phone]      Tests Needing Follow-up: Assessment of CBG control as well as HR should be carried out in f/u.  Discharge Diagnoses: Chronic afib w/ acute RVR  DM2 - severely uncontrolled  HTN  Hx of prolonged QTc  Hx of noncompliance with medications and medical f/u   Initial presentation: 42 y.o. female with history of atrial fibrillation who started experiencing palpitations around midnight the night of her admission. Patient was going to the bathroom when suddenly she started having palpitations. When she tried to walk she almost passed out. She enied chest pain or shortness of breath.   She came to the ER and was found to be in A. fib with RVR. Patient was started on Cardizem infusion. Patient's office visit 11/26 confirmed pt was presribed cardizem CD, as well as flecainide, and Xarelto. The pt admitted that she had not been taking her cardizem "for a while."  Hospital Course:  Chronic afib w/ acute RVR  h/o PAF dating back to 07/2011 at which time she presented to the ED, received Flecainide 300mg  x 1, converted to sinus, was discharged, and then never followed up with cardiology as instructed - admitted again 05/2012 with rapid afib and was placed on Xarelto (CHADSVASC2 = 3) and maintenance flecainide; post-hospital followup with Cards was arranged but she no-showed - she subsequently was admitted to the Cardiology service 09/30/2012 for recurrent RVR and spontaneously converted on  cardizem, after wch she was d/c on cardizem, flecainide, and Xarelto - she did present for a clinic f/u visit on 11/26 w/ Dr. Eden Emms - pt admits to not taking cardizem because she "ran out" - HR was easily controlled w/ IV cardizem, whch has then rapidly titrate to oral caridezem - pt converted back to NSR during the hospital stay - she was warned of the dire consequences of not taking her prescribed meds, to include a high risk of CVA  DM2  A1c markedly elevated at 11.9, likely due to noncompliance with meds and diet at home - pt strongly cautioned on absolute need to take better control of her DM and serious consequences of not doing so - will return to her prior prescribed tx regimen at d/c as it is difficult to tell if changes are needed as pt is not consistently taking her medications  HTN  Reasonably well controlled on meds while inpatient   Hx of prolonged QTc   Hx of noncompliance  Counseled extensively as to the absolute need for compliance with medications as well as regular medical f/u    Medication List         flecainide 50 MG tablet  Commonly known as:  TAMBOCOR  Take 1 tablet (50 mg total) by mouth 2 (two) times daily.     insulin aspart 100 UNIT/ML injection  Commonly known as:  novoLOG  Inject 8 Units into the skin 3 (three) times daily with meals.     insulin glargine 100 UNIT/ML injection  Commonly known as:  LANTUS  Inject 0.55 mLs (55 Units total) into the skin at bedtime.     lisinopril 5 MG tablet  Commonly known as:  PRINIVIL,ZESTRIL  Take 1 tablet (5 mg total) by mouth daily.     metFORMIN 500 MG tablet  Commonly known as:  GLUCOPHAGE  Take 1 tablet (500 mg total) by mouth 2 (two) times daily with a meal.     Rivaroxaban 20 MG Tabs tablet  Commonly known as:  XARELTO  Take 1 tablet (20 mg total) by mouth daily with supper.       Day of Discharge BP 130/97  Pulse 76  Temp(Src) 97.8 F (36.6 C) (Oral)  Resp 18  Ht 5\' 6"  (1.676 m)  Wt 84.5 kg  (186 lb 4.6 oz)  BMI 30.08 kg/m2  SpO2 99%  Physical Exam: General: No acute respiratory distress Lungs: Clear to auscultation bilaterally without wheezes or crackles Cardiovascular: Regular rate and rhythm without murmur gallop or rub normal S1 and S2 Abdomen: Nontender, nondistended, soft, bowel sounds positive, no rebound, no ascites, no appreciable mass Extremities: No significant cyanosis, clubbing, or edema bilateral lower extremities  Results for orders placed during the hospital encounter of 12/29/12 (from the past 24 hour(s))  TROPONIN I     Status: None   Collection Time    12/29/12 12:01 PM      Result Value Range   Troponin I <0.30  <0.30 ng/mL  GLUCOSE, CAPILLARY     Status: Abnormal   Collection Time    12/29/12 12:08 PM      Result Value Range   Glucose-Capillary 207 (*) 70 - 99 mg/dL  URINE RAPID DRUG SCREEN (HOSP PERFORMED)     Status: None   Collection Time    12/29/12  3:00 PM      Result Value Range   Opiates NONE DETECTED  NONE DETECTED   Cocaine NONE DETECTED  NONE DETECTED   Benzodiazepines NONE DETECTED  NONE DETECTED   Amphetamines NONE DETECTED  NONE DETECTED   Tetrahydrocannabinol NONE DETECTED  NONE DETECTED   Barbiturates NONE DETECTED  NONE DETECTED  PREGNANCY, URINE     Status: None   Collection Time    12/29/12  3:00 PM      Result Value Range   Preg Test, Ur NEGATIVE  NEGATIVE  GLUCOSE, CAPILLARY     Status: Abnormal   Collection Time    12/29/12  4:40 PM      Result Value Range   Glucose-Capillary 264 (*) 70 - 99 mg/dL  TROPONIN I     Status: None   Collection Time    12/29/12  6:20 PM      Result Value Range   Troponin I <0.30  <0.30 ng/mL  GLUCOSE, CAPILLARY     Status: Abnormal   Collection Time    12/29/12  9:16 PM      Result Value Range   Glucose-Capillary 309 (*) 70 - 99 mg/dL  BASIC METABOLIC PANEL     Status: Abnormal   Collection Time    12/30/12  4:48 AM      Result Value Range   Sodium 140  135 - 145 mEq/L    Potassium 3.8  3.5 - 5.1 mEq/L   Chloride 100  96 - 112 mEq/L   CO2 27  19 - 32 mEq/L   Glucose, Bld 253 (*) 70 - 99 mg/dL   BUN 13  6 - 23 mg/dL   Creatinine, Ser 1.61  0.50 - 1.10  mg/dL   Calcium 9.1  8.4 - 16.1 mg/dL   GFR calc non Af Amer >90  >90 mL/min   GFR calc Af Amer >90  >90 mL/min  CBC     Status: Abnormal   Collection Time    12/30/12  4:48 AM      Result Value Range   WBC 9.5  4.0 - 10.5 K/uL   RBC 5.09  3.87 - 5.11 MIL/uL   Hemoglobin 13.0  12.0 - 15.0 g/dL   HCT 09.6  04.5 - 40.9 %   MCV 78.6  78.0 - 100.0 fL   MCH 25.5 (*) 26.0 - 34.0 pg   MCHC 32.5  30.0 - 36.0 g/dL   RDW 81.1  91.4 - 78.2 %   Platelets 270  150 - 400 K/uL  GLUCOSE, CAPILLARY     Status: Abnormal   Collection Time    12/30/12  7:54 AM      Result Value Range   Glucose-Capillary 256 (*) 70 - 99 mg/dL    Time spent in discharge (includes decision making & examination of pt): 30 minutes  12/30/2012, 10:44 AM   Lonia Blood, MD [facility] Office  [phone] Pager [phone]  On-Call/Text Page:      [email or web address]      password [email or web address]

## 2012-12-30 NOTE — Progress Notes (Signed)
Reviewed discharge instructions with patient. IV removed without complications. Awaiting her ride.  Jakota Manthei, Charlaine Dalton RN

## 2012-12-30 NOTE — Progress Notes (Signed)
Utilization review completed. Axelle Szwed, RN, BSN. 

## 2013-04-21 ENCOUNTER — Encounter (HOSPITAL_COMMUNITY): Payer: Self-pay | Admitting: Emergency Medicine

## 2013-04-21 ENCOUNTER — Emergency Department (HOSPITAL_COMMUNITY): Payer: 59

## 2013-04-21 ENCOUNTER — Emergency Department (HOSPITAL_COMMUNITY)
Admission: EM | Admit: 2013-04-21 | Discharge: 2013-04-21 | Disposition: A | Discharge status: Home / Self Care | Payer: 59 | Attending: Emergency Medicine | Admitting: Emergency Medicine

## 2013-04-21 DIAGNOSIS — Z9889 Other specified postprocedural states: Secondary | ICD-10-CM | POA: Insufficient documentation

## 2013-04-21 DIAGNOSIS — Z7901 Long term (current) use of anticoagulants: Secondary | ICD-10-CM | POA: Insufficient documentation

## 2013-04-21 DIAGNOSIS — N12 Tubulo-interstitial nephritis, not specified as acute or chronic: Secondary | ICD-10-CM | POA: Insufficient documentation

## 2013-04-21 DIAGNOSIS — Z79899 Other long term (current) drug therapy: Secondary | ICD-10-CM | POA: Insufficient documentation

## 2013-04-21 DIAGNOSIS — I10 Essential (primary) hypertension: Secondary | ICD-10-CM | POA: Insufficient documentation

## 2013-04-21 DIAGNOSIS — Z8632 Personal history of gestational diabetes: Secondary | ICD-10-CM | POA: Insufficient documentation

## 2013-04-21 DIAGNOSIS — Z3202 Encounter for pregnancy test, result negative: Secondary | ICD-10-CM | POA: Insufficient documentation

## 2013-04-21 DIAGNOSIS — E119 Type 2 diabetes mellitus without complications: Secondary | ICD-10-CM | POA: Insufficient documentation

## 2013-04-21 DIAGNOSIS — Z794 Long term (current) use of insulin: Secondary | ICD-10-CM | POA: Insufficient documentation

## 2013-04-21 DIAGNOSIS — I4891 Unspecified atrial fibrillation: Secondary | ICD-10-CM | POA: Insufficient documentation

## 2013-04-21 LAB — CBC WITH DIFFERENTIAL/PLATELET
Basophils Absolute: 0 10*3/uL (ref 0.0–0.1)
Basophils Relative: 0 % (ref 0–1)
Eosinophils Absolute: 0.1 10*3/uL (ref 0.0–0.7)
Eosinophils Relative: 1 % (ref 0–5)
HEMATOCRIT: 39.6 % (ref 36.0–46.0)
HEMOGLOBIN: 12.9 g/dL (ref 12.0–15.0)
LYMPHS ABS: 2.7 10*3/uL (ref 0.7–4.0)
LYMPHS PCT: 25 % (ref 12–46)
MCH: 25.2 pg — ABNORMAL LOW (ref 26.0–34.0)
MCHC: 32.6 g/dL (ref 30.0–36.0)
MCV: 77.3 fL — ABNORMAL LOW (ref 78.0–100.0)
MONO ABS: 0.7 10*3/uL (ref 0.1–1.0)
MONOS PCT: 6 % (ref 3–12)
NEUTROS ABS: 7.5 10*3/uL (ref 1.7–7.7)
Neutrophils Relative %: 68 % (ref 43–77)
Platelets: 247 10*3/uL (ref 150–400)
RBC: 5.12 MIL/uL — ABNORMAL HIGH (ref 3.87–5.11)
RDW: 14 % (ref 11.5–15.5)
WBC: 11 10*3/uL — AB (ref 4.0–10.5)

## 2013-04-21 LAB — BASIC METABOLIC PANEL
BUN: 10 mg/dL (ref 6–23)
CHLORIDE: 100 meq/L (ref 96–112)
CO2: 25 meq/L (ref 19–32)
CREATININE: 0.64 mg/dL (ref 0.50–1.10)
Calcium: 9.4 mg/dL (ref 8.4–10.5)
GFR calc non Af Amer: >90 mL/min (ref >90)
Glucose, Bld: 226 mg/dL — ABNORMAL HIGH (ref 70–99)
POTASSIUM: 4 meq/L (ref 3.7–5.3)
Sodium: 141 mEq/L (ref 137–147)

## 2013-04-21 LAB — POC URINE PREG, ED: Preg Test, Ur: NEGATIVE

## 2013-04-21 LAB — URINE MICROSCOPIC-ADD ON

## 2013-04-21 LAB — URINALYSIS, ROUTINE W REFLEX MICROSCOPIC
BILIRUBIN URINE: NEGATIVE
GLUCOSE, UA: 100 mg/dL — AB
KETONES UR: NEGATIVE mg/dL
Nitrite: NEGATIVE
PH: 6.5 (ref 5.0–8.0)
Protein, ur: 100 mg/dL — AB
SPECIFIC GRAVITY, URINE: 1.02 (ref 1.005–1.030)
Urobilinogen, UA: 0.2 mg/dL (ref 0.0–1.0)

## 2013-04-21 MED ORDER — OXYCODONE-ACETAMINOPHEN 5-325 MG PO TABS: 1.0000 | ORAL_TABLET | ORAL | Status: DC | PRN

## 2013-04-21 MED ORDER — ONDANSETRON HCL 4 MG/2ML IJ SOLN: 4.0000 mg | Freq: Once | INTRAMUSCULAR | Status: DC

## 2013-04-21 MED ORDER — DEXTROSE 5 % IV SOLN: 100.0000 mg/kg | Freq: Once | INTRAVENOUS | Status: DC

## 2013-04-21 MED ORDER — DEXTROSE 5 % IV SOLN
1.0000 g | Freq: Once | INTRAVENOUS | Status: AC
  Administered 2013-04-21: 1 g via INTRAVENOUS
  Filled 2013-04-21: qty 10

## 2013-04-21 MED ORDER — METOCLOPRAMIDE HCL 5 MG/ML IJ SOLN
10.0000 mg | Freq: Once | INTRAMUSCULAR | Status: AC
  Administered 2013-04-21: 10 mg via INTRAVENOUS
  Filled 2013-04-21: qty 2

## 2013-04-21 MED ORDER — MORPHINE SULFATE 4 MG/ML IJ SOLN
4.0000 mg | Freq: Once | INTRAMUSCULAR | Status: AC
  Administered 2013-04-21: 4 mg via INTRAVENOUS
  Filled 2013-04-21: qty 1

## 2013-04-21 MED ORDER — CEPHALEXIN 500 MG PO CAPS: 500.0000 mg | ORAL_CAPSULE | Freq: Four times a day (QID) | ORAL | Status: DC

## 2013-04-21 NOTE — ED Notes (Signed)
Pt c/o left flank pain and left lower abd pain since yesterday and some nausea. Denies any vomiting. States it hurts when she pees.

## 2013-04-21 NOTE — ED Provider Notes (Signed)
CSN: [id number]     Arrival date & time 04/21/13  0906 History   First MD Initiated Contact with Patient 04/21/13 [phone]     Chief Complaint  Patient presents with  . Abdominal Pain  . Flank Pain      Patient is a 43 y.o. female presenting with flank pain. The history is provided by the patient.  Flank Pain This is a new problem. The current episode started 12 to 24 hours ago. The problem occurs constantly. The problem has been gradually worsening. Associated symptoms include abdominal pain. Pertinent negatives include no chest pain, no headaches and no shortness of breath. The symptoms are aggravated by twisting. The symptoms are relieved by rest. She has tried rest for the symptoms. The treatment provided no relief.  pt reports gradual onset of left flank pain last night that radiates into abdomen No falls/trauma She has never had this before No cp/sob No vomiting/diarrhea She takes xarelto for afib, no missed doses   Past Medical History  Diagnosis Date  . Diabetes mellitus     a. 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 b. 05/2012 A1c 12.6.  . Hypertension   . PAF (paroxysmal atrial fibrillation)     a. 07/2011 ED visit with AF-> converted with flecainide, never followed-up;  b. 05/2012 Recurrent Afib -echo: EF 65-60%, mildly dil LA. Placed on Xarelto/daily Flecainide. c. 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  . Noncompliance   . History of prolonged Q-T interval on ECG     a. 09/2012 adm: QTC 506.   Past Surgical History  Procedure Laterality Date  . Cesarean section    . Hernia repair     Family History  Problem Relation Age of Onset  . Other Father     killed in [place] when he was in his 7950's  . Asthma Mother     alive in her 1160's  . Other Sister     A & W  . Other Sister     A & W  . Heart disease Neg Hx    History  Substance Use Topics  . Smoking status: Never Smoker   . Smokeless tobacco: Never Used  . Alcohol Use: No    OB History   Grav Para Term Preterm Abortions TAB SAB Ect Mult Living                 Review of Systems  Constitutional: Negative for fever.  Respiratory: Negative for shortness of breath.   Cardiovascular: Negative for chest pain.  Gastrointestinal: Positive for nausea and abdominal pain. Negative for vomiting and blood in stool.  Genitourinary: Positive for dysuria and flank pain. Negative for vaginal bleeding and vaginal discharge.  Neurological: Negative for weakness and headaches.  All other systems reviewed and are negative.     Allergies  Review of patient's allergies indicates no known allergies.  Home Medications   Prior to Admission medications   Medication Sig Start Date End Date Taking? Authorizing Provider  flecainide (TAMBOCOR) 50 MG tablet Take 1 tablet (50 mg total) by mouth 2 (two) times daily. 12/30/12   Lonia BloodJeffrey T McClung, MD  insulin aspart (NOVOLOG) 100 UNIT/ML injection Inject 8 Units into the skin 3 (three) times daily with meals. 12/30/12   Lonia BloodJeffrey T McClung, MD  insulin glargine (LANTUS) 100 UNIT/ML injection Inject 0.55 mLs (55 Units total) into the skin at bedtime. 12/30/12   Lonia BloodJeffrey T McClung, MD  lisinopril (PRINIVIL,ZESTRIL) 5  MG tablet Take 1 tablet (5 mg total) by mouth daily. 12/30/12   Lonia BloodJeffrey T McClung, MD  metFORMIN (GLUCOPHAGE) 500 MG tablet Take 1 tablet (500 mg total) by mouth 2 (two) times daily with a meal. 12/30/12   Lonia BloodJeffrey T McClung, MD  Rivaroxaban (XARELTO) 20 MG TABS tablet Take 1 tablet (20 mg total) by mouth daily with supper. 12/30/12   Lonia BloodJeffrey T McClung, MD   BP 197/106  Pulse 102  Temp(Src) 97.7 F (36.5 C) (Oral)  Resp 20  Ht 5\' 6"  (1.676 m)  SpO2 99% Physical Exam CONSTITUTIONAL: Well developed/well nourished, uncomfortable appearing HEAD: Normocephalic/atraumatic EYES: EOMI/PERRL ENMT: Mucous membranes moist NECK: supple no meningeal signs SPINE:entire spine nontender CV: S1/S2 noted, no murmurs/rubs/gallops  noted LUNGS: Lungs are clear to auscultation bilaterally, no apparent distress ABDOMEN: soft, mild LLQ tenderness, no rebound or guarding ZO:XWRUGU:left cva tenderness NEURO: Pt is awake/alert, moves all extremitiesx4 EXTREMITIES: pulses normal, full ROM SKIN: warm, color normal PSYCH: no abnormalities of mood noted  ED Course  Procedures   11:44 AM Pt improved CT negative Suspect pyelonephritis Pt well appearing, no distress, not septic appearing Stable for d/c home We discussed strict return precautions  Labs Review Labs Reviewed  URINALYSIS, ROUTINE W REFLEX MICROSCOPIC - Abnormal; Notable for the following:    APPearance CLOUDY (*)    Glucose, UA 100 (*)    Hgb urine dipstick LARGE (*)    Protein, ur 100 (*)    Leukocytes, UA LARGE (*)    All other components within normal limits  BASIC METABOLIC PANEL - Abnormal; Notable for the following:    Glucose, Bld 226 (*)    All other components within normal limits  CBC WITH DIFFERENTIAL - Abnormal; Notable for the following:    WBC 11.0 (*)    RBC 5.12 (*)    MCV 77.3 (*)    MCH 25.2 (*)    All other components within normal limits  URINE MICROSCOPIC-ADD ON - Abnormal; Notable for the following:    Squamous Epithelial / LPF FEW (*)    Bacteria, UA MANY (*)    All other components within normal limits  URINE CULTURE  POC URINE PREG, ED    Imaging Review Ct Abdomen Pelvis Wo Contrast  04/21/2013   CLINICAL DATA:  Left flank pain.  EXAM: CT ABDOMEN AND PELVIS WITHOUT CONTRAST  TECHNIQUE: Multidetector CT imaging of the abdomen and pelvis was performed following the standard protocol without intravenous contrast.  COMPARISON:  June 15, 2011.  FINDINGS: There is no nephrolithiasis or hydroureteronephrosis bilaterally. The kidneys are normal. The spleen, pancreas and adrenal glands are normal. There is a small stone in the gallbladder. There is fatty infiltration of liver. The aorta is normal. There is no abdominal lymphadenopathy.  There is no small bowel obstruction or diverticulitis. The appendix is normal. The patient is status post prior ventral hernia repair.  Fluid-filled bladder is normal. The uterus is normal. The visualized lung bases. No acute abnormality is identified in the visualized bones.  IMPRESSION: No nephrolithiasis or hydroureteronephrosis bilaterally. No acute abnormality identified.   Electronically Signed   By: Sherian ReinWei-Chen  Lin M.D.   On: 04/21/2013 10:35      MDM   Final diagnoses:  Pyelonephritis    Nursing notes including past medical history and social history reviewed and considered in documentation Labs/vital reviewed and considered Previous records reviewed and considered - h/o afib and prolonged qt noted     Joya Gaskinsonald W Soren Lazarz, MD 04/21/13 1145

## 2013-04-21 NOTE — Discharge Instructions (Signed)
Pyelonephritis, Adult °Pyelonephritis is a kidney infection. A kidney infection can happen quickly, or it can last for a long time. °HOME CARE  °· Take your medicine (antibiotics) as told. Finish it even if you start to feel better. °· Keep all doctor visits as told. °· Drink enough fluids to keep your pee (urine) clear or pale yellow. °· Only take medicine as told by your doctor. °GET HELP RIGHT AWAY IF:  °· You have a fever or lasting symptoms for more than 2-3 days. °· You have a fever and your symptoms suddenly get worse. °· You cannot take your medicine or drink fluids as told. °· You have chills and shaking. °· You feel very weak or pass out (faint). °· You do not feel better after 2 days. °MAKE SURE YOU: °· Understand these instructions. °· Will watch your condition. °· Will get help right away if you are not doing well or get worse. °Document Released: 01/30/2004 Document Revised: 06/23/2011 Document Reviewed: 06/11/2010 °ExitCare® Patient Information ©2014 [employer]. ° °

## 2013-04-23 LAB — URINE CULTURE

## 2013-04-24 NOTE — ED Notes (Signed)
+   urine Patient treated per protocol MD.

## 2013-04-26 ENCOUNTER — Telehealth (HOSPITAL_BASED_OUTPATIENT_CLINIC_OR_DEPARTMENT_OTHER): Payer: Self-pay | Admitting: Emergency Medicine

## 2013-04-26 NOTE — Telephone Encounter (Signed)
Post ED Visit - Positive Culture Follow-up  Culture report reviewed by antimicrobial stewardship pharmacist: []  Wes Dulaney, Pharm.D., BCPS [x]  Celedonio MiyamotoJeremy Frens, Pharm.D., BCPS []  Georgina PillionElizabeth Martin, Pharm.D., BCPS []  MilwaukieMinh Pham, [address] BoulevardPharm.D., BCPS, AAHIVP []  Estella HuskMichelle Turner, Pharm.D., BCPS, AAHIVP []  Harvie JuniorNathan Cope, Pharm.D.  Positive urine culture Treated with Keflex, organism sensitive to the same and no further patient follow-up is required at this time.  Zeb ComfortKylie Decarlos Empey 04/26/2013, 12:16 PM

## 2013-05-15 ENCOUNTER — Encounter (HOSPITAL_COMMUNITY): Payer: Self-pay | Admitting: Emergency Medicine

## 2013-05-15 ENCOUNTER — Emergency Department (HOSPITAL_COMMUNITY)
Admission: EM | Admit: 2013-05-15 | Discharge: 2013-05-15 | Disposition: A | Discharge status: Home / Self Care | Payer: 59 | Attending: Emergency Medicine | Admitting: Emergency Medicine

## 2013-05-15 ENCOUNTER — Emergency Department (HOSPITAL_COMMUNITY): Payer: 59

## 2013-05-15 DIAGNOSIS — R079 Chest pain, unspecified: Secondary | ICD-10-CM

## 2013-05-15 DIAGNOSIS — R071 Chest pain on breathing: Secondary | ICD-10-CM | POA: Insufficient documentation

## 2013-05-15 DIAGNOSIS — Z792 Long term (current) use of antibiotics: Secondary | ICD-10-CM | POA: Insufficient documentation

## 2013-05-15 DIAGNOSIS — R739 Hyperglycemia, unspecified: Secondary | ICD-10-CM

## 2013-05-15 DIAGNOSIS — E119 Type 2 diabetes mellitus without complications: Secondary | ICD-10-CM | POA: Insufficient documentation

## 2013-05-15 DIAGNOSIS — Z9119 Patient's noncompliance with other medical treatment and regimen: Secondary | ICD-10-CM | POA: Insufficient documentation

## 2013-05-15 DIAGNOSIS — I10 Essential (primary) hypertension: Secondary | ICD-10-CM | POA: Insufficient documentation

## 2013-05-15 DIAGNOSIS — I4891 Unspecified atrial fibrillation: Secondary | ICD-10-CM | POA: Insufficient documentation

## 2013-05-15 DIAGNOSIS — Z91199 Patient's noncompliance with other medical treatment and regimen due to unspecified reason: Secondary | ICD-10-CM | POA: Insufficient documentation

## 2013-05-15 DIAGNOSIS — Z794 Long term (current) use of insulin: Secondary | ICD-10-CM | POA: Insufficient documentation

## 2013-05-15 DIAGNOSIS — Z79899 Other long term (current) drug therapy: Secondary | ICD-10-CM | POA: Insufficient documentation

## 2013-05-15 DIAGNOSIS — Z7901 Long term (current) use of anticoagulants: Secondary | ICD-10-CM | POA: Insufficient documentation

## 2013-05-15 DIAGNOSIS — R002 Palpitations: Secondary | ICD-10-CM | POA: Insufficient documentation

## 2013-05-15 LAB — CBC WITH DIFFERENTIAL/PLATELET
BASOS PCT: 0 % (ref 0–1)
Basophils Absolute: 0 10*3/uL (ref 0.0–0.1)
EOS ABS: 0.1 10*3/uL (ref 0.0–0.7)
Eosinophils Relative: 1 % (ref 0–5)
HEMATOCRIT: 36.1 % (ref 36.0–46.0)
Hemoglobin: 11.6 g/dL — ABNORMAL LOW (ref 12.0–15.0)
Lymphocytes Relative: 32 % (ref 12–46)
Lymphs Abs: 2.4 10*3/uL (ref 0.7–4.0)
MCH: 25.1 pg — ABNORMAL LOW (ref 26.0–34.0)
MCHC: 32.1 g/dL (ref 30.0–36.0)
MCV: 78.1 fL (ref 78.0–100.0)
MONO ABS: 0.6 10*3/uL (ref 0.1–1.0)
Monocytes Relative: 8 % (ref 3–12)
Neutro Abs: 4.3 10*3/uL (ref 1.7–7.7)
Neutrophils Relative %: 59 % (ref 43–77)
PLATELETS: 220 10*3/uL (ref 150–400)
RBC: 4.62 MIL/uL (ref 3.87–5.11)
RDW: 14.2 % (ref 11.5–15.5)
WBC: 7.4 10*3/uL (ref 4.0–10.5)

## 2013-05-15 LAB — BASIC METABOLIC PANEL
BUN: 8 mg/dL (ref 6–23)
CALCIUM: 8.9 mg/dL (ref 8.4–10.5)
CO2: 26 mEq/L (ref 19–32)
CREATININE: 0.62 mg/dL (ref 0.50–1.10)
Chloride: 96 mEq/L (ref 96–112)
GFR calc Af Amer: >90 mL/min (ref >90)
Glucose, Bld: 445 mg/dL — ABNORMAL HIGH (ref 70–99)
Potassium: 4.3 mEq/L (ref 3.7–5.3)
SODIUM: 136 meq/L — AB (ref 137–147)

## 2013-05-15 LAB — CBG MONITORING, ED
Glucose-Capillary: 391 mg/dL — ABNORMAL HIGH (ref 70–99)
Glucose-Capillary: 315 mg/dL — ABNORMAL HIGH (ref 70–99)

## 2013-05-15 MED ORDER — NAPROXEN 375 MG PO TABS: 375.0000 mg | ORAL_TABLET | Freq: Two times a day (BID) | ORAL | Status: AC

## 2013-05-15 MED ORDER — INSULIN ASPART 100 UNIT/ML ~~LOC~~ SOLN
10.0000 [IU] | Freq: Once | SUBCUTANEOUS | Status: AC
  Administered 2013-05-15: 10 [IU] via SUBCUTANEOUS
  Filled 2013-05-15: qty 1

## 2013-05-15 MED ORDER — SODIUM CHLORIDE 0.9 % IV BOLUS (SEPSIS)
1000.0000 mL | Freq: Once | INTRAVENOUS | Status: AC
  Administered 2013-05-15: 1000 mL via INTRAVENOUS

## 2013-05-15 NOTE — ED Notes (Signed)
CBG recheck of 315

## 2013-05-15 NOTE — Discharge Instructions (Signed)
Chest Pain (Nonspecific) °Chest pain has many causes. Your pain could be caused by something serious, such as a heart attack or a blood clot in the lungs. It could also be caused by something less serious, such as a chest bruise or a virus. Follow up with your doctor. More lab tests or other studies may be needed to find the cause of your pain. Most of the time, nonspecific chest pain will improve within 2 to 3 days of rest and mild pain medicine. °HOME CARE °· For chest bruises, you may put ice on the sore area for 15-20 minutes, 03-04 times a day. Do this only if it makes you feel better. °· Put ice in a plastic bag. °· Place a towel between the skin and the bag. °· Rest for the next 2 to 3 days. °· Go back to work if the pain improves. °· See your doctor if the pain lasts longer than 1 to 2 weeks. °· Only take medicine as told by your doctor. °· Quit smoking if you smoke. °GET HELP RIGHT AWAY IF:  °· There is more pain or pain that spreads to the arm, neck, jaw, back, or belly (abdomen). °· You have shortness of breath. °· You cough more than usual or cough up blood. °· You have very bad back or belly pain, feel sick to your stomach (nauseous), or throw up (vomit). °· You have very bad weakness. °· You pass out (faint). °· You have a fever. °Any of these problems may be serious and may be an emergency. Do not wait to see if the problems will go away. Get medical help right away. Call your local emergency services 911 in [place] Do not drive yourself to the hospital. °MAKE SURE YOU:  °· Understand these instructions. °· Will watch this condition. °· Will get help right away if you or your child is not doing well or gets worse. °Document Released: 06/10/2007 Document Revised: 03/16/2011 Document Reviewed: 06/10/2007 °ExitCare® Patient Information ©2014 [employer]. ° °

## 2013-05-15 NOTE — ED Provider Notes (Signed)
CSN: [id number]     Arrival date & time 05/15/13  [id number] History   First MD Initiated Contact with Patient 05/15/13 0932     Chief Complaint  Patient presents with  . Chest Pain   In addition to what is listed below.  Patient is a 43 y.o. with history of HTN, DM, and PAF who presents with right sided chest pain that moves into back.  Pain is made worse with deep inspiration and use of the right arm.  She also reports her BG has been running in the 200s over the past several weeks despite taking her insulin regimen.     (Consider location/radiation/quality/duration/timing/severity/associated sxs/prior Treatment) Patient is a 43 y.o. female presenting with chest pain. The history is provided by the patient and medical records. No language interpreter was used.  Chest Pain Pain location:  R chest Pain quality: stabbing   Pain radiates to:  Upper back Pain radiates to the back: yes   Pain severity:  Mild Onset quality:  Gradual Duration:  48 hours Timing:  Constant Progression:  Worsening Chronicity:  New Context: not lifting and no trauma   Relieved by:  Nothing Worsened by:  Deep breathing Ineffective treatments:  Rest (advil) Associated symptoms: palpitations   Associated symptoms: no abdominal pain, no anorexia, no anxiety, no cough, no near-syncope, no shortness of breath, no syncope and no weakness   Risk factors: diabetes mellitus and hypertension   Risk factors: no immobilization, no prior DVT/PE, no smoking and no surgery     Past Medical History  Diagnosis Date  . Diabetes mellitus     a. 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 b. 05/2012 A1c 12.6.  . Hypertension   . PAF (paroxysmal atrial fibrillation)     a. 07/2011 ED visit with AF-> converted with flecainide, never followed-up;  b. 05/2012 Recurrent Afib -echo: EF 65-60%, mildly dil LA. Placed on Xarelto/daily Flecainide. c. 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  . Noncompliance   .  History of prolonged Q-T interval on ECG     a. 09/2012 adm: QTC 506.   Past Surgical History  Procedure Laterality Date  . Cesarean section    . Hernia repair     Family History  Problem Relation Age of Onset  . Other Father     killed in [place] when he was in his 7550's  . Asthma Mother     alive in her 4160's  . Other Sister     A & W  . Other Sister     A & W  . Heart disease Neg Hx    History  Substance Use Topics  . Smoking status: Never Smoker   . Smokeless tobacco: Never Used  . Alcohol Use: No   OB History   Grav Para Term Preterm Abortions TAB SAB Ect Mult Living                 Review of Systems  Respiratory: Negative for cough and shortness of breath.   Cardiovascular: Positive for chest pain and palpitations. Negative for syncope and near-syncope.  Gastrointestinal: Negative for abdominal pain and anorexia.  Neurological: Negative for weakness.  All other systems reviewed and are negative.     Allergies  Review of patient's allergies indicates no known allergies.  Home Medications   Prior to Admission medications   Medication Sig Start Date End Date Taking? Authorizing Provider  cephALEXin (KEFLEX) 500 MG capsule Take  1 capsule (500 mg total) by mouth 4 (four) times daily. 04/21/13   Joya Gaskinsonald W Wickline, MD  flecainide (TAMBOCOR) 50 MG tablet Take 1 tablet (50 mg total) by mouth 2 (two) times daily. 12/30/12   Lonia BloodJeffrey T McClung, MD  insulin aspart (NOVOLOG) 100 UNIT/ML injection Inject 8 Units into the skin 3 (three) times daily with meals. 12/30/12   Lonia BloodJeffrey T McClung, MD  insulin glargine (LANTUS) 100 UNIT/ML injection Inject 0.55 mLs (55 Units total) into the skin at bedtime. 12/30/12   Lonia BloodJeffrey T McClung, MD  lisinopril (PRINIVIL,ZESTRIL) 5 MG tablet Take 1 tablet (5 mg total) by mouth daily. 12/30/12   Lonia BloodJeffrey T McClung, MD  metFORMIN (GLUCOPHAGE) 500 MG tablet Take 1 tablet (500 mg total) by mouth 2 (two) times daily with a meal. 12/30/12    Lonia BloodJeffrey T McClung, MD  oxyCODONE-acetaminophen (PERCOCET/ROXICET) 5-325 MG per tablet Take 1 tablet by mouth every 4 (four) hours as needed for severe pain. 04/21/13   Joya Gaskinsonald W Wickline, MD  Rivaroxaban (XARELTO) 20 MG TABS tablet Take 1 tablet (20 mg total) by mouth daily with supper. 12/30/12   Lonia BloodJeffrey T McClung, MD   BP 169/95  Pulse 95  Temp(Src) 98.9 F (37.2 C) (Oral)  Resp 16  SpO2 97% Physical Exam  Nursing note and vitals reviewed. Constitutional: She is oriented to person, place, and time. She appears well-developed and well-nourished.  HENT:  Head: Normocephalic and atraumatic.  Right Ear: External ear normal.  Left Ear: External ear normal.  Mouth/Throat: Oropharynx is clear and moist.  Eyes: Conjunctivae and EOM are normal. Pupils are equal, round, and reactive to light.  Neck: Normal range of motion. Neck supple.  Cardiovascular: Normal rate, regular rhythm and normal heart sounds.   Pulmonary/Chest: Effort normal and breath sounds normal. No respiratory distress. She exhibits tenderness (TTP over right side of chest wall).  Abdominal: Soft. Bowel sounds are normal. She exhibits no distension.  Musculoskeletal: Normal range of motion. She exhibits no edema and no tenderness.  Neurological: She is alert and oriented to person, place, and time. She has normal reflexes.  Skin: Skin is warm and dry.  Psychiatric: She has a normal mood and affect.    ED Course  Procedures (including critical care time) Labs Review Labs Reviewed  CBC WITH DIFFERENTIAL - Abnormal; Notable for the following:    Hemoglobin 11.6 (*)    MCH 25.1 (*)    All other components within normal limits  BASIC METABOLIC PANEL - Abnormal; Notable for the following:    Sodium 136 (*)    Glucose, Bld 445 (*)    All other components within normal limits  CBG MONITORING, ED - Abnormal; Notable for the following:    Glucose-Capillary 391 (*)    All other components within normal limits  CBG MONITORING,  ED - Abnormal; Notable for the following:    Glucose-Capillary 315 (*)    All other components within normal limits    Imaging Review Dg Chest Port 1 View  05/15/2013   CLINICAL DATA:  Right-sided chest pain this morning, history hypertension, diabetes, paroxysmal atrial fibrillation  EXAM: PORTABLE CHEST - 1 VIEW  COMPARISON:  Portable exam 0957 hr compared to 12/29/2012  FINDINGS: Upper-normal size of cardiac silhouette.  Mediastinal contours and pulmonary vascularity normal.  Minimal linear atelectasis at LEFT base.  Lungs otherwise clear.  No pleural effusion or pneumothorax.  Bones unremarkable.  IMPRESSION: Minimal LEFT basilar atelectasis.  Otherwise negative exam.   Electronically Signed  By: Ulyses Southward M.D.   On: 05/15/2013 10:16     EKG Interpretation   Date/Time:  Monday May 15 2013 16:10:96 EDT Ventricular Rate:  101 PR Interval:  172 QRS Duration: 76 QT Interval:  354 QTC Calculation: 459 R Axis:   56 Text Interpretation:  Sinus tachycardia Biatrial enlargement Nonspecific  ST abnormality Confirmed by Denton Lank  MD, Caryn Bee ([id number]) on 05/15/2013 9:40:09  AM      MDM   Final diagnoses:  Chest pain  Hyperglycemia    Patient is a 43 y.o. who presents with right sided chest wall pain as well as elevated home BG values.  Her VS were remarkable for no fever, no tachycardia, and no hypertension.  PE as above and showed no signs of dehydration, no focal source of infection, and reproducible TTP over right lateral chest wall.  EKG done in triage shows no evidence of ischemia or right heart strain.  Doubt PE as patient has no SOB, no hemoptysis, no risk factors for PE, no hypoxia, and PE more c/w MSK pain.  CXR obtained to rule out PNA, PTX, or other acute cardiopulmonary disease.  CXR shows no acute process,  POCT BG obtained and high at 391.  Basic labs obtained to rule out metabolic abnormalities such as DKA.  Review of those labs show no evidence of DKA.  After 10 units of SQ  insulin and IVF BG noted to trending downward.  Will treat with NSAIDs for likely MSK CP and have patient follow closely with PCP for BG management.    Johnney Ou, MD 05/15/13 1728

## 2013-05-15 NOTE — ED Notes (Signed)
Started as sharp back pain on sat  Then moved to cp and  when walking was tired

## 2013-05-16 NOTE — ED Provider Notes (Signed)
I saw and evaluated the patient, reviewed the resident's note and I agree with the findings and plan.   EKG Interpretation   Date/Time:  Monday May 15 2013 45:40:[id number]:[id number]:22 EDT Ventricular Rate:  101 PR Interval:  172 QRS Duration: 76 QT Interval:  354 QTC Calculation: 459 R Axis:   56 Text Interpretation:  Sinus tachycardia Biatrial enlargement Nonspecific  ST abnormality Confirmed by Denton LankSTEINL  MD, Caryn BeeKEVIN ([id number]) on 05/15/2013 9:40:09  AM      Pt with right lateral back and chest pain. Worse w palpation trapezius and scapula region on right, worse w movement right shoulder and positional changes/turning.  Chest cta. Rrr. No rub. No murmur. abd soft nt. Afeb. No pleuritic pain.  Cxr. Pain appears musculoskeletal.   Suzi RootsKevin E Adryanna Friedt, MD 05/16/13 1100

## 2013-08-29 ENCOUNTER — Emergency Department (HOSPITAL_COMMUNITY): Payer: 59

## 2013-08-29 ENCOUNTER — Emergency Department (HOSPITAL_COMMUNITY)
Admission: EM | Admit: 2013-08-29 | Discharge: 2013-08-30 | Disposition: A | Discharge status: Home / Self Care | Payer: 59 | Attending: Emergency Medicine | Admitting: Emergency Medicine

## 2013-08-29 ENCOUNTER — Encounter (HOSPITAL_COMMUNITY): Payer: Self-pay | Admitting: Emergency Medicine

## 2013-08-29 DIAGNOSIS — R079 Chest pain, unspecified: Secondary | ICD-10-CM | POA: Insufficient documentation

## 2013-08-29 DIAGNOSIS — E119 Type 2 diabetes mellitus without complications: Secondary | ICD-10-CM | POA: Insufficient documentation

## 2013-08-29 DIAGNOSIS — I10 Essential (primary) hypertension: Secondary | ICD-10-CM | POA: Insufficient documentation

## 2013-08-29 DIAGNOSIS — Z79899 Other long term (current) drug therapy: Secondary | ICD-10-CM | POA: Insufficient documentation

## 2013-08-29 DIAGNOSIS — Z792 Long term (current) use of antibiotics: Secondary | ICD-10-CM | POA: Insufficient documentation

## 2013-08-29 DIAGNOSIS — R0789 Other chest pain: Secondary | ICD-10-CM | POA: Insufficient documentation

## 2013-08-29 DIAGNOSIS — I4891 Unspecified atrial fibrillation: Secondary | ICD-10-CM | POA: Insufficient documentation

## 2013-08-29 DIAGNOSIS — Z7901 Long term (current) use of anticoagulants: Secondary | ICD-10-CM | POA: Insufficient documentation

## 2013-08-29 DIAGNOSIS — R739 Hyperglycemia, unspecified: Secondary | ICD-10-CM

## 2013-08-29 DIAGNOSIS — Z3202 Encounter for pregnancy test, result negative: Secondary | ICD-10-CM | POA: Insufficient documentation

## 2013-08-29 DIAGNOSIS — Z794 Long term (current) use of insulin: Secondary | ICD-10-CM | POA: Insufficient documentation

## 2013-08-29 DIAGNOSIS — Z9119 Patient's noncompliance with other medical treatment and regimen: Secondary | ICD-10-CM | POA: Insufficient documentation

## 2013-08-29 DIAGNOSIS — Z91199 Patient's noncompliance with other medical treatment and regimen due to unspecified reason: Secondary | ICD-10-CM | POA: Insufficient documentation

## 2013-08-29 LAB — BASIC METABOLIC PANEL
Anion gap: 15 (ref 5–15)
BUN: 10 mg/dL (ref 6–23)
CO2: 24 mEq/L (ref 19–32)
Calcium: 9.2 mg/dL (ref 8.4–10.5)
Chloride: 100 mEq/L (ref 96–112)
Creatinine, Ser: 0.79 mg/dL (ref 0.50–1.10)
GFR calc Af Amer: >90 mL/min (ref >90)
GFR calc non Af Amer: >90 mL/min (ref >90)
Glucose, Bld: 366 mg/dL — ABNORMAL HIGH (ref 70–99)
Potassium: 4 mEq/L (ref 3.7–5.3)
Sodium: 139 mEq/L (ref 137–147)

## 2013-08-29 LAB — CBC
HCT: 34.6 % — ABNORMAL LOW (ref 36.0–46.0)
Hemoglobin: 11.5 g/dL — ABNORMAL LOW (ref 12.0–15.0)
MCH: 25.7 pg — AB (ref 26.0–34.0)
MCHC: 33.2 g/dL (ref 30.0–36.0)
MCV: 77.2 fL — ABNORMAL LOW (ref 78.0–100.0)
PLATELETS: 277 10*3/uL (ref 150–400)
RBC: 4.48 MIL/uL (ref 3.87–5.11)
RDW: 13.6 % (ref 11.5–15.5)
WBC: 8.2 10*3/uL (ref 4.0–10.5)

## 2013-08-29 LAB — PRO B NATRIURETIC PEPTIDE: Pro B Natriuretic peptide (BNP): 15.8 pg/mL (ref 0–125)

## 2013-08-29 LAB — I-STAT TROPONIN, ED: Troponin i, poc: 0 ng/mL (ref 0.00–0.08)

## 2013-08-29 NOTE — ED Notes (Signed)
Pt reports non-radiating intermittent central chest "squeezing" since this AM; associated with nausea, lightheadedness and weakness. Pt denies SOB, diaphoresis.Pt noted to be hypertensive in triage, denies taking medication.  Hx: HTN, DM

## 2013-08-30 LAB — URINALYSIS, ROUTINE W REFLEX MICROSCOPIC
Bilirubin Urine: NEGATIVE
Hgb urine dipstick: NEGATIVE
KETONES UR: NEGATIVE mg/dL
Leukocytes, UA: NEGATIVE
Nitrite: NEGATIVE
PH: 5.5 (ref 5.0–8.0)
Protein, ur: NEGATIVE mg/dL
Specific Gravity, Urine: 1.035 — ABNORMAL HIGH (ref 1.005–1.030)
Urobilinogen, UA: 0.2 mg/dL (ref 0.0–1.0)

## 2013-08-30 LAB — URINE MICROSCOPIC-ADD ON

## 2013-08-30 LAB — POC URINE PREG, ED: PREG TEST UR: NEGATIVE

## 2013-08-30 MED ORDER — SODIUM CHLORIDE 0.9 % IV BOLUS (SEPSIS)
1000.0000 mL | Freq: Once | INTRAVENOUS | Status: AC
  Administered 2013-08-30: 1000 mL via INTRAVENOUS

## 2013-08-30 MED ORDER — OMEPRAZOLE 20 MG PO CPDR: 20.0000 mg | DELAYED_RELEASE_CAPSULE | Freq: Every day | ORAL | Status: DC

## 2013-08-30 MED ORDER — GI COCKTAIL ~~LOC~~
30.0000 mL | Freq: Once | ORAL | Status: AC
  Administered 2013-08-30: 30 mL via ORAL
  Filled 2013-08-30: qty 30

## 2013-08-30 NOTE — ED Provider Notes (Signed)
CSN: [id number]     Arrival date & time 08/29/13  2216 History   First MD Initiated Contact with Patient 08/30/13 0007     Chief Complaint  Patient presents with  . Chest Pain     (Consider location/radiation/quality/duration/timing/severity/associated sxs/prior Treatment) Patient is a 43 y.o. female presenting with chest pain. The history is provided by the patient and medical records. No language interpreter was used.  Chest Pain Associated symptoms: no abdominal pain, no back pain, no cough, no diaphoresis, no fatigue, no fever, no headache, no nausea, no shortness of breath and not vomiting     [name] is a 43 y.o. female  with a hx of HTN, IDDM, proxysmal  A-fib, prolonged q-t presents to the Emergency Department complaining of gradual, persistent, stabbing chest pain, somewhat improved onset 7am this morning.  Pt denies new lifting, exercise or other symptoms.  Pt denies Dx of GERD, but endorses intermittent burning in her chest.  Pt denies associated symptoms.  Pt took Advil with some relief.  Movement and palpation makes it worse.  Pt denies fever, chills, headache, neck pain, SOB, abd pain, N/V/D, weakness, dizziness, syncope, dysuria.  Pt denies eating late last night and denies new foods.     Past Medical History  Diagnosis Date  . Diabetes mellitus     a. 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 b. 05/2012 A1c 12.6.  . Hypertension   . PAF (paroxysmal atrial fibrillation)     a. 07/2011 ED visit with AF-> converted with flecainide, never followed-up;  b. 05/2012 Recurrent Afib -echo: EF 65-60%, mildly dil LA. Placed on Xarelto/daily Flecainide. c. 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  . Noncompliance   . History of prolonged Q-T interval on ECG     a. 09/2012 adm: QTC 506.   Past Surgical History  Procedure Laterality Date  . Cesarean section    . Hernia repair     Family History  Problem Relation Age of Onset  . Other Father    killed in [place] civil war when he was in his 40's  . Asthma Mother     alive in her 49's  . Other Sister     A & W  . Other Sister     A & W  . Heart disease Neg Hx    History  Substance Use Topics  . Smoking status: Never Smoker   . Smokeless tobacco: Never Used  . Alcohol Use: No   OB History   Grav Para Term Preterm Abortions TAB SAB Ect Mult Living                 Review of Systems  Constitutional: Negative for fever, diaphoresis, appetite change, fatigue and unexpected weight change.  HENT: Negative for mouth sores.   Eyes: Negative for visual disturbance.  Respiratory: Negative for cough, chest tightness, shortness of breath and wheezing.   Cardiovascular: Positive for chest pain.  Gastrointestinal: Negative for nausea, vomiting, abdominal pain, diarrhea and constipation.  Endocrine: Negative for polydipsia, polyphagia and polyuria.  Genitourinary: Negative for dysuria, urgency, frequency and hematuria.  Musculoskeletal: Negative for back pain and neck stiffness.  Skin: Negative for rash.  Allergic/Immunologic: Negative for immunocompromised state.  Neurological: Negative for syncope, light-headedness and headaches.  Hematological: Does not bruise/bleed easily.  Psychiatric/Behavioral: Negative for sleep disturbance. The patient is not nervous/anxious.       Allergies  Review of patient's allergies indicates no known allergies.  Home  Medications   Prior to Admission medications   Medication Sig Start Date End Date Taking? Authorizing Provider  cephALEXin (KEFLEX) 500 MG capsule Take 1 capsule (500 mg total) by mouth 4 (four) times daily. 04/21/13   Joya Gaskins, MD  flecainide (TAMBOCOR) 50 MG tablet Take 1 tablet (50 mg total) by mouth 2 (two) times daily. 12/30/12   Lonia Blood, MD  ibuprofen (ADVIL,MOTRIN) 200 MG tablet Take 400 mg by mouth every 6 (six) hours as needed for mild pain.    Historical Provider, MD  insulin aspart (NOVOLOG) 100  UNIT/ML injection Inject 8 Units into the skin 3 (three) times daily with meals. 12/30/12   Lonia Blood, MD  insulin glargine (LANTUS) 100 UNIT/ML injection Inject 0.55 mLs (55 Units total) into the skin at bedtime. 12/30/12   Lonia Blood, MD  lisinopril (PRINIVIL,ZESTRIL) 5 MG tablet Take 1 tablet (5 mg total) by mouth daily. 12/30/12   Lonia Blood, MD  metFORMIN (GLUCOPHAGE) 500 MG tablet Take 1 tablet (500 mg total) by mouth 2 (two) times daily with a meal. 12/30/12   Lonia Blood, MD  omeprazole (PRILOSEC) 20 MG capsule Take 1 capsule (20 mg total) by mouth daily. 08/30/13   Crockett Rallo, PA-C  oxyCODONE-acetaminophen (PERCOCET/ROXICET) 5-325 MG per tablet Take 1 tablet by mouth every 4 (four) hours as needed for severe pain. 04/21/13   Joya Gaskins, MD  Rivaroxaban (XARELTO) 20 MG TABS tablet Take 1 tablet (20 mg total) by mouth daily with supper. 12/30/12   Lonia Blood, MD   BP 181/90  Pulse 82  Temp(Src) 98.8 F (37.1 C) (Oral)  Resp 16  Ht  (1.702 m)  Wt 187 lb (84.823 kg)  BMI 29.28 kg/m2  SpO2 98% Physical Exam  Nursing note and vitals reviewed. Constitutional: She is oriented to person, place, and time. She appears well-developed and well-nourished. No distress.  Awake, alert, nontoxic appearance  HENT:  Head: Normocephalic and atraumatic.  Mouth/Throat: Oropharynx is clear and moist. No oropharyngeal exudate.  Eyes: Conjunctivae are normal. No scleral icterus.  Neck: Normal range of motion. Neck supple.  Cardiovascular: Normal rate, regular rhythm, normal heart sounds and intact distal pulses.   No murmur heard. Pulmonary/Chest: Effort normal and breath sounds normal. No respiratory distress. She has no wheezes. She exhibits tenderness (anterior).  Equal chest expansion  Abdominal: Soft. Bowel sounds are normal. She exhibits no mass. There is no tenderness. There is no rebound and no guarding.  Musculoskeletal: Normal range of  motion. She exhibits no edema.  Neurological: She is alert and oriented to person, place, and time. She exhibits normal muscle tone. Coordination normal.  Speech is clear and goal oriented Moves extremities without ataxia  Skin: Skin is warm and dry. She is not diaphoretic. No erythema.  Psychiatric: She has a normal mood and affect.    ED Course  Procedures (including critical care time) Labs Review Labs Reviewed  CBC - Abnormal; Notable for the following:    Hemoglobin 11.5 (*)    HCT 34.6 (*)    MCV 77.2 (*)    MCH 25.7 (*)    All other components within normal limits  BASIC METABOLIC PANEL - Abnormal; Notable for the following:    Glucose, Bld 366 (*)    All other components within normal limits  PRO B NATRIURETIC PEPTIDE  URINALYSIS, ROUTINE W REFLEX MICROSCOPIC  I-STAT TROPOININ, ED  POC URINE PREG, ED    Imaging Review  Dg Chest 2 View  08/29/2013   CLINICAL DATA:  Centralized chest pain this morning.  EXAM: CHEST  2 VIEW  COMPARISON:  05/15/2013  FINDINGS: The heart size and mediastinal contours are within normal limits. Both lungs are clear. The visualized skeletal structures are unremarkable.  IMPRESSION: No active cardiopulmonary disease.   Electronically Signed   By: Burman Nieves M.D.   On: 08/29/2013 23:20     EKG Interpretation None      ECG:  Date: 08/30/2013  Rate: 78  Rhythm: normal sinus rhythm  QRS Axis: normal  Intervals: normal  ST/T Wave abnormalities: normal  Conduction Disutrbances:none  Narrative Interpretation: nonischemic ECG, unchanged from 05/15/13  Old EKG Reviewed: unchanged   MDM   Final diagnoses:  Atypical chest pain  Hyperglycemia without ketosis    [name] presents with anterior chest pain since 7am this morning.  Pt denies known activity to create strained muscles or recent viral illness, but CP is reproducible with palpation to the anterior chest.  Pt with Heart Score of 1 and normal ECG and troponin.  Hx of a-fib  without evidence of same today.    1:12 AM Chest pain is not likely of cardiac or pulmonary etiology d/t presentation, PERC negative, VSS, no tracheal deviation, no JVD or new murmur, RRR, breath sounds equal bilaterally, EKG without acute abnormalities, negative troponin, and negative CXR. Pt has been advised start a PPI and return to the ED if CP becomes exertional, associated with diaphoresis or nausea, radiates to left jaw/arm, worsens or becomes concerning in any way. Pt appears reliable for follow up and is agreeable to discharge. Patient is to be discharged with recommendation to follow up with PCP in 3 days in regards to today's hospital visit.    Case has been discussed with Dr. Wilkie Aye who agrees with the above plan to discharge.   BP 181/90  Pulse 82  Temp(Src) 98.8 F (37.1 C) (Oral)  Resp 16  Ht  (1.702 m)  Wt 187 lb (84.823 kg)  BMI 29.28 kg/m2  SpO2 98%    Dierdre Forth, PA-C 08/30/13 2344

## 2013-08-30 NOTE — ED Notes (Signed)
Pt has been out of her Lantus for 2 days.  Reports chest pain left/right mid chest.  Comes and goes.

## 2013-08-30 NOTE — Discharge Instructions (Signed)
1. Medications: usual home medications 2. Treatment: rest, drink plenty of fluids,  3. Follow Up: Please followup with your primary doctor in 3 days for discussion of your diagnoses and further evaluation after today's visit;     Chest Pain (Nonspecific) It is often hard to give a specific diagnosis for the cause of chest pain. There is always a chance that your pain could be related to something serious, such as a heart attack or a blood clot in the lungs. You need to follow up with your health care provider for further evaluation. CAUSES   Heartburn.  Pneumonia or bronchitis.  Anxiety or stress.  Inflammation around your heart (pericarditis) or lung (pleuritis or pleurisy).  A blood clot in the lung.  A collapsed lung (pneumothorax). It can develop suddenly on its own (spontaneous pneumothorax) or from trauma to the chest.  Shingles infection (herpes zoster virus). The chest wall is composed of bones, muscles, and cartilage. Any of these can be the source of the pain.  The bones can be bruised by injury.  The muscles or cartilage can be strained by coughing or overwork.  The cartilage can be affected by inflammation and become sore (costochondritis). DIAGNOSIS  Lab tests or other studies may be needed to find the cause of your pain. Your health care provider may have you take a test called an ambulatory electrocardiogram (ECG). An ECG records your heartbeat patterns over a 24-hour period. You may also have other tests, such as:  Transthoracic echocardiogram (TTE). During echocardiography, sound waves are used to evaluate how blood flows through your heart.  Transesophageal echocardiogram (TEE).  Cardiac monitoring. This allows your health care provider to monitor your heart rate and rhythm in real time.  Holter monitor. This is a portable device that records your heartbeat and can help diagnose heart arrhythmias. It allows your health care provider to track your heart  activity for several days, if needed.  Stress tests by exercise or by giving medicine that makes the heart beat faster. TREATMENT   Treatment depends on what may be causing your chest pain. Treatment may include:  Acid blockers for heartburn.  Anti-inflammatory medicine.  Pain medicine for inflammatory conditions.  Antibiotics if an infection is present.  You may be advised to change lifestyle habits. This includes stopping smoking and avoiding alcohol, caffeine, and chocolate.  You may be advised to keep your head raised (elevated) when sleeping. This reduces the chance of acid going backward from your stomach into your esophagus. Most of the time, nonspecific chest pain will improve within 2-3 days with rest and mild pain medicine.  HOME CARE INSTRUCTIONS   If antibiotics were prescribed, take them as directed. Finish them even if you start to feel better.  For the next few days, avoid physical activities that bring on chest pain. Continue physical activities as directed.  Do not use any tobacco products, including cigarettes, chewing tobacco, or electronic cigarettes.  Avoid drinking alcohol.  Only take medicine as directed by your health care provider.  Follow your health care provider's suggestions for further testing if your chest pain does not go away.  Keep any follow-up appointments you made. If you do not go to an appointment, you could develop lasting (chronic) problems with pain. If there is any problem keeping an appointment, call to reschedule. SEEK MEDICAL CARE IF:   Your chest pain does not go away, even after treatment.  You have a rash with blisters on your chest.  You have a  fever. SEEK IMMEDIATE MEDICAL CARE IF:   You have increased chest pain or pain that spreads to your arm, neck, jaw, back, or abdomen.  You have shortness of breath.  You have an increasing cough, or you cough up blood.  You have severe back or abdominal pain.  You feel  nauseous or vomit.  You have severe weakness.  You faint.  You have chills. This is an emergency. Do not wait to see if the pain will go away. Get medical help at once. Call your local emergency services (911 in [place] Do not drive yourself to the hospital. MAKE SURE YOU:   Understand these instructions.  Will watch your condition.  Will get help right away if you are not doing well or get worse. Document Released: 10/01/2004 Document Revised: 12/27/2012 Document Reviewed: 07/28/2007 [facility] Patient Information 2015 [place], [place]. This information is not intended to replace advice given to you by your health care provider. Make sure you discuss any questions you have with your health care provider.

## 2013-08-31 NOTE — ED Provider Notes (Signed)
Medical screening examination/treatment/procedure(s) were performed by non-physician practitioner and as supervising physician I was immediately available for consultation/collaboration.   EKG Interpretation None        Shon Baton, MD 08/31/13 [phone]

## 2013-09-19 ENCOUNTER — Encounter (HOSPITAL_COMMUNITY): Payer: Self-pay | Admitting: Emergency Medicine

## 2013-09-19 ENCOUNTER — Emergency Department (HOSPITAL_COMMUNITY): Payer: 59

## 2013-09-19 DIAGNOSIS — Z79899 Other long term (current) drug therapy: Secondary | ICD-10-CM | POA: Insufficient documentation

## 2013-09-19 DIAGNOSIS — I4891 Unspecified atrial fibrillation: Secondary | ICD-10-CM | POA: Insufficient documentation

## 2013-09-19 DIAGNOSIS — Z794 Long term (current) use of insulin: Secondary | ICD-10-CM | POA: Insufficient documentation

## 2013-09-19 DIAGNOSIS — E119 Type 2 diabetes mellitus without complications: Secondary | ICD-10-CM | POA: Insufficient documentation

## 2013-09-19 DIAGNOSIS — R071 Chest pain on breathing: Secondary | ICD-10-CM | POA: Insufficient documentation

## 2013-09-19 DIAGNOSIS — I10 Essential (primary) hypertension: Secondary | ICD-10-CM | POA: Insufficient documentation

## 2013-09-19 DIAGNOSIS — R079 Chest pain, unspecified: Secondary | ICD-10-CM | POA: Insufficient documentation

## 2013-09-19 DIAGNOSIS — Z7901 Long term (current) use of anticoagulants: Secondary | ICD-10-CM | POA: Insufficient documentation

## 2013-09-19 LAB — CBC
HCT: 39 % (ref 36.0–46.0)
Hemoglobin: 13 g/dL (ref 12.0–15.0)
MCH: 25.5 pg — ABNORMAL LOW (ref 26.0–34.0)
MCHC: 33.3 g/dL (ref 30.0–36.0)
MCV: 76.5 fL — ABNORMAL LOW (ref 78.0–100.0)
Platelets: 306 10*3/uL (ref 150–400)
RBC: 5.1 MIL/uL (ref 3.87–5.11)
RDW: 13.5 % (ref 11.5–15.5)
WBC: 8.9 10*3/uL (ref 4.0–10.5)

## 2013-09-19 LAB — BASIC METABOLIC PANEL
Anion gap: 17 — ABNORMAL HIGH (ref 5–15)
BUN: 14 mg/dL (ref 6–23)
CO2: 25 mEq/L (ref 19–32)
CREATININE: 0.59 mg/dL (ref 0.50–1.10)
Calcium: 9.4 mg/dL (ref 8.4–10.5)
Chloride: 93 mEq/L — ABNORMAL LOW (ref 96–112)
GFR calc Af Amer: >90 mL/min (ref >90)
GLUCOSE: 323 mg/dL — AB (ref 70–99)
POTASSIUM: 3.7 meq/L (ref 3.7–5.3)
Sodium: 135 mEq/L — ABNORMAL LOW (ref 137–147)

## 2013-09-19 LAB — I-STAT TROPONIN, ED: Troponin i, poc: 0 ng/mL (ref 0.00–0.08)

## 2013-09-19 NOTE — ED Notes (Signed)
Pt reports 8/10 right sided "squeezing" chest pain with radiation to back since 1800 tonight with dizziness and nausea. Denies SOB, diaphoresis. Pt in NAD. AO x4,.

## 2013-09-20 ENCOUNTER — Emergency Department (HOSPITAL_COMMUNITY)
Admission: EM | Admit: 2013-09-20 | Discharge: 2013-09-20 | Disposition: A | Discharge status: Home / Self Care | Payer: 59 | Attending: Emergency Medicine | Admitting: Emergency Medicine

## 2013-09-20 DIAGNOSIS — Z7901 Long term (current) use of anticoagulants: Secondary | ICD-10-CM

## 2013-09-20 DIAGNOSIS — R0789 Other chest pain: Secondary | ICD-10-CM

## 2013-09-20 MED ORDER — TRAMADOL HCL 50 MG PO TABS: 50.0000 mg | ORAL_TABLET | Freq: Four times a day (QID) | ORAL | Status: DC | PRN

## 2013-09-20 NOTE — ED Notes (Signed)
Pt A&OX4, ambulatory at d/c with steady gait, NAD 

## 2013-09-20 NOTE — Discharge Instructions (Signed)
Chest Pain (Nonspecific) °It is often hard to give a specific diagnosis for the cause of chest pain. There is always a chance that your pain could be related to something serious, such as a heart attack or a blood clot in the lungs. You need to follow up with your health care provider for further evaluation. °CAUSES  °· Heartburn. °· Pneumonia or bronchitis. °· Anxiety or stress. °· Inflammation around your heart (pericarditis) or lung (pleuritis or pleurisy). °· A blood clot in the lung. °· A collapsed lung (pneumothorax). It can develop suddenly on its own (spontaneous pneumothorax) or from trauma to the chest. °· Shingles infection (herpes zoster virus). °The chest wall is composed of bones, muscles, and cartilage. Any of these can be the source of the pain. °· The bones can be bruised by injury. °· The muscles or cartilage can be strained by coughing or overwork. °· The cartilage can be affected by inflammation and become sore (costochondritis). °DIAGNOSIS  °Lab tests or other studies may be needed to find the cause of your pain. Your health care provider may have you take a test called an ambulatory electrocardiogram (ECG). An ECG records your heartbeat patterns over a 24-hour period. You may also have other tests, such as: °· Transthoracic echocardiogram (TTE). During echocardiography, sound waves are used to evaluate how blood flows through your heart. °· Transesophageal echocardiogram (TEE). °· Cardiac monitoring. This allows your health care provider to monitor your heart rate and rhythm in real time. °· Holter monitor. This is a portable device that records your heartbeat and can help diagnose heart arrhythmias. It allows your health care provider to track your heart activity for several days, if needed. °· Stress tests by exercise or by giving medicine that makes the heart beat faster. °TREATMENT  °· Treatment depends on what may be causing your chest pain. Treatment may include: °¨ Acid blockers for  heartburn. °¨ Anti-inflammatory medicine. °¨ Pain medicine for inflammatory conditions. °¨ Antibiotics if an infection is present. °· You may be advised to change lifestyle habits. This includes stopping smoking and avoiding alcohol, caffeine, and chocolate. °· You may be advised to keep your head raised (elevated) when sleeping. This reduces the chance of acid going backward from your stomach into your esophagus. °Most of the time, nonspecific chest pain will improve within 2-3 days with rest and mild pain medicine.  °HOME CARE INSTRUCTIONS  °· If antibiotics were prescribed, take them as directed. Finish them even if you start to feel better. °· For the next few days, avoid physical activities that bring on chest pain. Continue physical activities as directed. °· Do not use any tobacco products, including cigarettes, chewing tobacco, or electronic cigarettes. °· Avoid drinking alcohol. °· Only take medicine as directed by your health care provider. °· Follow your health care provider's suggestions for further testing if your chest pain does not go away. °· Keep any follow-up appointments you made. If you do not go to an appointment, you could develop lasting (chronic) problems with pain. If there is any problem keeping an appointment, call to reschedule. °SEEK MEDICAL CARE IF:  °· Your chest pain does not go away, even after treatment. °· You have a rash with blisters on your chest. °· You have a fever. °SEEK IMMEDIATE MEDICAL CARE IF:  °· You have increased chest pain or pain that spreads to your arm, neck, jaw, back, or abdomen. °· You have shortness of breath. °· You have an increasing cough, or you cough   up blood.  You have severe back or abdominal pain.  You feel nauseous or vomit.  You have severe weakness.  You faint.  You have chills. This is an emergency. Do not wait to see if the pain will go away. Get medical help at once. Call your local emergency services (911 in [place] Do not drive  yourself to the hospital. MAKE SURE YOU:   Understand these instructions.  Will watch your condition.  Will get help right away if you are not doing well or get worse. Document Released: 10/01/2004 Document Revised: 12/27/2012 Document Reviewed: 07/28/2007 [facility] Patient Information 2015 [place], [place]. This information is not intended to replace advice given to you by your health care provider. Make sure you discuss any questions you have with your health care provider.  Tramadol tablets What is this medicine? TRAMADOL (TRA ma dole) is a pain reliever. It is used to treat moderate to severe pain in adults. This medicine may be used for other purposes; ask your health care provider or pharmacist if you have questions. COMMON BRAND NAME(S): Ultram What should I tell my health care provider before I take this medicine? They need to know if you have any of these conditions: -brain tumor -depression -drug abuse or addiction -head injury -if you frequently drink alcohol containing drinks -kidney disease or trouble passing urine -liver disease -lung disease, asthma, or breathing problems -seizures or epilepsy -suicidal thoughts, plans, or attempt; a previous suicide attempt by you or a family member -an unusual or allergic reaction to tramadol, codeine, other medicines, foods, dyes, or preservatives -pregnant or trying to get pregnant -breast-feeding How should I use this medicine? Take this medicine by mouth with a full glass of water. Follow the directions on the prescription label. If the medicine upsets your stomach, take it with food or milk. Do not take more medicine than you are told to take. Talk to your pediatrician regarding the use of this medicine in children. Special care may be needed. Overdosage: If you think you have taken too much of this medicine contact a poison control center or emergency room at once. NOTE: This medicine is only for you. Do not share this medicine  with others. What if I miss a dose? If you miss a dose, take it as soon as you can. If it is almost time for your next dose, take only that dose. Do not take double or extra doses. What may interact with this medicine? Do not take this medicine with any of the following medications: -MAOIs like Carbex, Eldepryl, Marplan, Nardil, and Parnate This medicine may also interact with the following medications: -alcohol or medicines that contain alcohol -antihistamines -benzodiazepines -bupropion -carbamazepine or oxcarbazepine -clozapine -cyclobenzaprine -digoxin -furazolidone -linezolid -medicines for depression, anxiety, or psychotic disturbances -medicines for migraine headache like almotriptan, eletriptan, frovatriptan, naratriptan, rizatriptan, sumatriptan, zolmitriptan -medicines for pain like pentazocine, buprenorphine, butorphanol, meperidine, nalbuphine, and propoxyphene -medicines for sleep -muscle relaxants -naltrexone -phenobarbital -phenothiazines like perphenazine, thioridazine, chlorpromazine, mesoridazine, fluphenazine, prochlorperazine, promazine, and trifluoperazine -procarbazine -warfarin This list may not describe all possible interactions. Give your health care provider a list of all the medicines, herbs, non-prescription drugs, or dietary supplements you use. Also tell them if you smoke, drink alcohol, or use illegal drugs. Some items may interact with your medicine. What should I watch for while using this medicine? Tell your doctor or health care professional if your pain does not go away, if it gets worse, or if you have new or a different type  of pain. You may develop tolerance to the medicine. Tolerance means that you will need a higher dose of the medicine for pain relief. Tolerance is normal and is expected if you take this medicine for a long time. Do not suddenly stop taking your medicine because you may develop a severe reaction. Your body becomes used to the  medicine. This does NOT mean you are addicted. Addiction is a behavior related to getting and using a drug for a non-medical reason. If you have pain, you have a medical reason to take pain medicine. Your doctor will tell you how much medicine to take. If your doctor wants you to stop the medicine, the dose will be slowly lowered over time to avoid any side effects. You may get drowsy or dizzy. Do not drive, use machinery, or do anything that needs mental alertness until you know how this medicine affects you. Do not stand or sit up quickly, especially if you are an older patient. This reduces the risk of dizzy or fainting spells. Alcohol can increase or decrease the effects of this medicine. Avoid alcoholic drinks. You may have constipation. Try to have a bowel movement at least every 2 to 3 days. If you do not have a bowel movement for 3 days, call your doctor or health care professional. Your mouth may get dry. Chewing sugarless gum or sucking hard candy, and drinking plenty of water may help. Contact your doctor if the problem does not go away or is severe. What side effects may I notice from receiving this medicine? Side effects that you should report to your doctor or health care professional as soon as possible: -allergic reactions like skin rash, itching or hives, swelling of the face, lips, or tongue -breathing difficulties, wheezing -confusion -itching -light headedness or fainting spells -redness, blistering, peeling or loosening of the skin, including inside the mouth -seizures Side effects that usually do not require medical attention (report to your doctor or health care professional if they continue or are bothersome): -constipation -dizziness -drowsiness -headache -nausea, vomiting This list may not describe all possible side effects. Call your doctor for medical advice about side effects. You may report side effects to FDA at 1-800-FDA-1088. Where should I keep my medicine? Keep  out of the reach of children. Store at room temperature between 15 and 30 degrees C (59 and 86 degrees F). Keep container tightly closed. Throw away any unused medicine after the expiration date. NOTE: This sheet is a summary. It may not cover all possible information. If you have questions about this medicine, talk to your doctor, pharmacist, or health care provider.  2015, [employer]. (2009-09-04 11:55:44)

## 2013-09-20 NOTE — ED Provider Notes (Addendum)
CSN: [id number]     Arrival date & time 09/19/13  2229 History   First MD Initiated Contact with Patient 09/20/13 [phone]     Chief Complaint  Patient presents with  . Chest Pain     (Consider location/radiation/quality/duration/timing/severity/associated sxs/prior Treatment) Patient is a 43 y.o. female presenting with chest pain. The history is provided by the patient.  Chest Pain She had onset about 8 AM of any pain in her anterior chest which she described as sharp and aching. Pain has been constant through the day but has gradually improved. It is slightly worse with deep inspiration but there is no associated dyspnea, nausea, diaphoresis. She has had similar pain in the past and has been seen in the ED for same. She is diabetic and she is anticoagulated because of paroxysmal atrial fibrillation. She denies any palpitations. She claims compliance with her anticoagulant.  Past Medical History  Diagnosis Date  . Diabetes mellitus     a. 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 b. 05/2012 A1c 12.6.  . Hypertension   . PAF (paroxysmal atrial fibrillation)     a. 07/2011 ED visit with AF-> converted with flecainide, never followed-up;  b. 05/2012 Recurrent Afib -echo: EF 65-60%, mildly dil LA. Placed on Xarelto/daily Flecainide. c. 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  . Noncompliance   . History of prolonged Q-T interval on ECG     a. 09/2012 adm: QTC 506.   Past Surgical History  Procedure Laterality Date  . Cesarean section    . Hernia repair     Family History  Problem Relation Age of Onset  . Other Father     killed in [place] civil war when he was in his 33's  . Asthma Mother     alive in her 34's  . Other Sister     A & W  . Other Sister     A & W  . Heart disease Neg Hx    History  Substance Use Topics  . Smoking status: Never Smoker   . Smokeless tobacco: Never Used  . Alcohol Use: No   OB History   Grav Para Term Preterm Abortions TAB  SAB Ect Mult Living                 Review of Systems  Cardiovascular: Positive for chest pain.  All other systems reviewed and are negative.     Allergies  Review of patient's allergies indicates no known allergies.  Home Medications   Prior to Admission medications   Medication Sig Start Date End Date Taking? Authorizing Provider  flecainide (TAMBOCOR) 50 MG tablet Take 1 tablet (50 mg total) by mouth 2 (two) times daily. 12/30/12  Yes Lonia Blood, MD  ibuprofen (ADVIL,MOTRIN) 200 MG tablet Take 400 mg by mouth every 6 (six) hours as needed for mild pain.   Yes Historical Provider, MD  insulin aspart (NOVOLOG) 100 UNIT/ML injection Inject 8 Units into the skin 3 (three) times daily with meals. 12/30/12  Yes Lonia Blood, MD  insulin glargine (LANTUS) 100 UNIT/ML injection Inject 0.55 mLs (55 Units total) into the skin at bedtime. 12/30/12  Yes Lonia Blood, MD  lisinopril (PRINIVIL,ZESTRIL) 5 MG tablet Take 1 tablet (5 mg total) by mouth daily. 12/30/12  Yes Lonia Blood, MD  metFORMIN (GLUCOPHAGE) 500 MG tablet Take 1 tablet (500 mg total) by mouth 2 (two) times daily with a meal. 12/30/12  Yes  Lonia Blood, MD  Rivaroxaban (XARELTO) 20 MG TABS tablet Take 1 tablet (20 mg total) by mouth daily with supper. 12/30/12  Yes Lonia Blood, MD   BP 131/73  Pulse 69  Temp(Src) 98.5 F (36.9 C) (Oral)  Resp 19  SpO2 96% Physical Exam  Nursing note and vitals reviewed.  43 year old female, resting comfortably and in no acute distress. Vital signs are normal. Oxygen saturation is 96%, which is normal. Head is normocephalic and atraumatic. PERRLA, EOMI. Oropharynx is clear. Neck is nontender and supple without adenopathy or JVD. Back is nontender and there is no CVA tenderness. Lungs are clear without rales, wheezes, or rhonchi. Chest is moderately tender in the parasternal area with the left side being more tender than the right. This does reproduce her  pain. Heart has regular rate and rhythm without murmur. Abdomen is soft, flat, nontender without masses or hepatosplenomegaly and peristalsis is normoactive. Extremities have no cyanosis or edema, full range of motion is present. Skin is warm and dry without rash. Neurologic: Mental status is normal, cranial nerves are intact, there are no motor or sensory deficits.  ED Course  Procedures (including critical care time) Labs Review Results for orders placed during the hospital encounter of 09/20/13  CBC      Result Value Ref Range   WBC 8.9  4.0 - 10.5 K/uL   RBC 5.10  3.87 - 5.11 MIL/uL   Hemoglobin 13.0  12.0 - 15.0 g/dL   HCT 56.2  13.0 - 86.5 %   MCV 76.5 (*) 78.0 - 100.0 fL   MCH 25.5 (*) 26.0 - 34.0 pg   MCHC 33.3  30.0 - 36.0 g/dL   RDW 78.4  69.6 - 29.5 %   Platelets 306  150 - 400 K/uL  BASIC METABOLIC PANEL      Result Value Ref Range   Sodium 135 (*) 137 - 147 mEq/L   Potassium 3.7  3.7 - 5.3 mEq/L   Chloride 93 (*) 96 - 112 mEq/L   CO2 25  19 - 32 mEq/L   Glucose, Bld 323 (*) 70 - 99 mg/dL   BUN 14  6 - 23 mg/dL   Creatinine, Ser 2.84  0.50 - 1.10 mg/dL   Calcium 9.4  8.4 - 13.2 mg/dL   GFR calc non Af Amer >90  >90 mL/min   GFR calc Af Amer >90  >90 mL/min   Anion gap 17 (*) 5 - 15  I-STAT TROPOININ, ED      Result Value Ref Range   Troponin i, poc 0.00  0.00 - 0.08 ng/mL   Comment 3            Imaging Review Dg Chest 2 View  09/20/2013   CLINICAL DATA:  Chest pain.  EXAM: CHEST  2 VIEW  COMPARISON:  08/29/2013  FINDINGS: The heart size and mediastinal contours are within normal limits. Both lungs are clear. The visualized skeletal structures are unremarkable.  IMPRESSION: No active cardiopulmonary disease.   Electronically Signed   By: Rosalie Gums M.D.   On: 09/20/2013 00:19     EKG Interpretation   Date/Time:  Tuesday September 19 2013 22:36:04 EDT Ventricular Rate:  85 PR Interval:  180 QRS Duration: 86 QT Interval:  396 QTC Calculation: 471 R  Axis:   63 Text Interpretation:  Normal sinus rhythm Biatrial enlargement Abnormal  ECG When compared with ECG of 08/29/2013, No significant change was found  Confirmed by [facility]  MD, Jewelene Mairena ([id number]) on 09/20/2013 3:32:00 AM      MDM   Final diagnoses:  None    Chest pain which seems to be chest wall pain. He CG and troponin are unremarkable. Review of old records shows 2 prior ED visits with virtually identical presentations. She has been evaluated by cardiology in the past but that was for atrial fibrillation. Given her multiple ED presentations for chest pain, I feel that a stress test would be warranted to reassure her that there is no underlying coronary disease. She was returned back to her cardiologist to consider outpatient stress testing. He is given a prescription for tramadol for pain. NSAIDs are not used because of use of anticoagulants.    Dione Booze, MD 09/20/13 6045  Dione Booze, MD 09/20/13 [phone]

## 2013-10-04 ENCOUNTER — Encounter: Payer: Self-pay | Admitting: *Deleted

## 2013-10-29 ENCOUNTER — Encounter (HOSPITAL_COMMUNITY): Payer: Self-pay | Admitting: Emergency Medicine

## 2013-10-29 ENCOUNTER — Emergency Department (HOSPITAL_COMMUNITY)
Admission: EM | Admit: 2013-10-29 | Discharge: 2013-10-29 | Disposition: A | Discharge status: Home / Self Care | Payer: 59 | Attending: Emergency Medicine | Admitting: Emergency Medicine

## 2013-10-29 ENCOUNTER — Emergency Department (HOSPITAL_COMMUNITY): Payer: 59

## 2013-10-29 DIAGNOSIS — I10 Essential (primary) hypertension: Secondary | ICD-10-CM | POA: Insufficient documentation

## 2013-10-29 DIAGNOSIS — Z7982 Long term (current) use of aspirin: Secondary | ICD-10-CM | POA: Insufficient documentation

## 2013-10-29 DIAGNOSIS — Z794 Long term (current) use of insulin: Secondary | ICD-10-CM | POA: Insufficient documentation

## 2013-10-29 DIAGNOSIS — Z8719 Personal history of other diseases of the digestive system: Secondary | ICD-10-CM | POA: Insufficient documentation

## 2013-10-29 DIAGNOSIS — R079 Chest pain, unspecified: Secondary | ICD-10-CM | POA: Insufficient documentation

## 2013-10-29 DIAGNOSIS — E119 Type 2 diabetes mellitus without complications: Secondary | ICD-10-CM | POA: Insufficient documentation

## 2013-10-29 DIAGNOSIS — Z9119 Patient's noncompliance with other medical treatment and regimen: Secondary | ICD-10-CM | POA: Insufficient documentation

## 2013-10-29 DIAGNOSIS — Z7901 Long term (current) use of anticoagulants: Secondary | ICD-10-CM | POA: Insufficient documentation

## 2013-10-29 DIAGNOSIS — I4891 Unspecified atrial fibrillation: Secondary | ICD-10-CM | POA: Insufficient documentation

## 2013-10-29 DIAGNOSIS — Z79899 Other long term (current) drug therapy: Secondary | ICD-10-CM | POA: Insufficient documentation

## 2013-10-29 DIAGNOSIS — Z8632 Personal history of gestational diabetes: Secondary | ICD-10-CM | POA: Insufficient documentation

## 2013-10-29 LAB — I-STAT TROPONIN, ED: Troponin i, poc: 0 ng/mL (ref 0.00–0.08)

## 2013-10-29 LAB — CBC
HCT: 37.2 % (ref 36.0–46.0)
Hemoglobin: 12.2 g/dL (ref 12.0–15.0)
MCH: 25.3 pg — ABNORMAL LOW (ref 26.0–34.0)
MCHC: 32.8 g/dL (ref 30.0–36.0)
MCV: 77 fL — AB (ref 78.0–100.0)
PLATELETS: 298 10*3/uL (ref 150–400)
RBC: 4.83 MIL/uL (ref 3.87–5.11)
RDW: 13.8 % (ref 11.5–15.5)
WBC: 9.3 10*3/uL (ref 4.0–10.5)

## 2013-10-29 LAB — BASIC METABOLIC PANEL
ANION GAP: 16 — AB (ref 5–15)
BUN: 12 mg/dL (ref 6–23)
CALCIUM: 9.9 mg/dL (ref 8.4–10.5)
CHLORIDE: 92 meq/L — AB (ref 96–112)
CO2: 25 mEq/L (ref 19–32)
CREATININE: 0.82 mg/dL (ref 0.50–1.10)
GFR calc non Af Amer: 87 mL/min — ABNORMAL LOW (ref >90)
Glucose, Bld: 468 mg/dL — ABNORMAL HIGH (ref 70–99)
Potassium: 4.2 mEq/L (ref 3.7–5.3)
Sodium: 133 mEq/L — ABNORMAL LOW (ref 137–147)

## 2013-10-29 MED ORDER — XARELTO VTE STARTER PACK 15 & 20 MG PO TBPK: 15.0000 mg | ORAL_TABLET | ORAL | Status: DC

## 2013-10-29 MED ORDER — OMEPRAZOLE 20 MG PO CPDR: 20.0000 mg | DELAYED_RELEASE_CAPSULE | Freq: Every day | ORAL | Status: DC

## 2013-10-29 NOTE — Discharge Instructions (Signed)
As discussed, your evaluation today has been largely reassuring.  But, it is important that you monitor your condition carefully, and do not hesitate to return to the ED if you develop new, or concerning changes in your condition. ° °Otherwise, please follow-up with your physician for appropriate ongoing care. ° °Chest Pain (Nonspecific) °It is often hard to give a specific diagnosis for the cause of chest pain. There is always a chance that your pain could be related to something serious, such as a heart attack or a blood clot in the lungs. You need to follow up with your health care provider for further evaluation. °CAUSES  °· Heartburn. °· Pneumonia or bronchitis. °· Anxiety or stress. °· Inflammation around your heart (pericarditis) or lung (pleuritis or pleurisy). °· A blood clot in the lung. °· A collapsed lung (pneumothorax). It can develop suddenly on its own (spontaneous pneumothorax) or from trauma to the chest. °· Shingles infection (herpes zoster virus). °The chest wall is composed of bones, muscles, and cartilage. Any of these can be the source of the pain. °· The bones can be bruised by injury. °· The muscles or cartilage can be strained by coughing or overwork. °· The cartilage can be affected by inflammation and become sore (costochondritis). °DIAGNOSIS  °Lab tests or other studies may be needed to find the cause of your pain. Your health care provider may have you take a test called an ambulatory electrocardiogram (ECG). An ECG records your heartbeat patterns over a 24-hour period. You may also have other tests, such as: °· Transthoracic echocardiogram (TTE). During echocardiography, sound waves are used to evaluate how blood flows through your heart. °· Transesophageal echocardiogram (TEE). °· Cardiac monitoring. This allows your health care provider to monitor your heart rate and rhythm in real time. °· Holter monitor. This is a portable device that records your heartbeat and can help diagnose  heart arrhythmias. It allows your health care provider to track your heart activity for several days, if needed. °· Stress tests by exercise or by giving medicine that makes the heart beat faster. °TREATMENT  °· Treatment depends on what may be causing your chest pain. Treatment may include: °¨ Acid blockers for heartburn. °¨ Anti-inflammatory medicine. °¨ Pain medicine for inflammatory conditions. °¨ Antibiotics if an infection is present. °· You may be advised to change lifestyle habits. This includes stopping smoking and avoiding alcohol, caffeine, and chocolate. °· You may be advised to keep your head raised (elevated) when sleeping. This reduces the chance of acid going backward from your stomach into your esophagus. °Most of the time, nonspecific chest pain will improve within 2-3 days with rest and mild pain medicine.  °HOME CARE INSTRUCTIONS  °· If antibiotics were prescribed, take them as directed. Finish them even if you start to feel better. °· For the next few days, avoid physical activities that bring on chest pain. Continue physical activities as directed. °· Do not use any tobacco products, including cigarettes, chewing tobacco, or electronic cigarettes. °· Avoid drinking alcohol. °· Only take medicine as directed by your health care provider. °· Follow your health care provider's suggestions for further testing if your chest pain does not go away. °· Keep any follow-up appointments you made. If you do not go to an appointment, you could develop lasting (chronic) problems with pain. If there is any problem keeping an appointment, call to reschedule. °SEEK MEDICAL CARE IF:  °· Your chest pain does not go away, even after treatment. °· You have   a rash with blisters on your chest. °· You have a fever. °SEEK IMMEDIATE MEDICAL CARE IF:  °· You have increased chest pain or pain that spreads to your arm, neck, jaw, back, or abdomen. °· You have shortness of breath. °· You have an increasing cough, or you  cough up blood. °· You have severe back or abdominal pain. °· You feel nauseous or vomit. °· You have severe weakness. °· You faint. °· You have chills. °This is an emergency. Do not wait to see if the pain will go away. Get medical help at once. Call your local emergency services (911 in [place] Do not drive yourself to the hospital. °MAKE SURE YOU:  °· Understand these instructions. °· Will watch your condition. °· Will get help right away if you are not doing well or get worse. °Document Released: 10/01/2004 Document Revised: 12/27/2012 Document Reviewed: 07/28/2007 °[employer]® Patient Information ©2015 [employer]. This information is not intended to replace advice given to you by your health care provider. Make sure you discuss any questions you have with your health care provider. ° °

## 2013-10-29 NOTE — ED Notes (Signed)
Pt presents with non radiating Right side chest pain, under Left breast, and weakness starting Thursday. Pt seen here 3 days ago for the same. Pt denies nausea, vomiting, abd pain, back/arm pain, cough, fever or chills.

## 2013-10-29 NOTE — ED Provider Notes (Signed)
CSN: [id number]     Arrival date & time 10/29/13  0058 History   First MD Initiated Contact with Patient 10/29/13 0100     Chief Complaint  Patient presents with  . Chest Pain      HPI  Patient presents with concern of ongoing chest pain.  Patient was seen here several days ago for similar concerns.  Since that time she has had persistent pain.  Pain is generally right-sided, occasionally with radiation from the left.  Pain is sore, moderate, with no associated dyspnea, lightheadedness, syncope, nausea, vomiting, other focal changes from baseline medical condition. Patient has not seen her primary care physician since that evaluation.   Past Medical History  Diagnosis Date  . Diabetes mellitus     uncontrolled  . Hx gestational diabetes   . Small bowel obstruction 09/2010    resolved  . Atrial fibrillation with rapid ventricular response     PAF new onset  . Diabetes mellitus     a. 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 b. 05/2012 A1c 12.6.  . Hypertension   . PAF (paroxysmal atrial fibrillation)     a. 07/2011 ED visit with AF-> converted with flecainide, never followed-up;  b. 05/2012 Recurrent Afib -echo: EF 65-60%, mildly dil LA. Placed on Xarelto/daily Flecainide. c. 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  . Noncompliance   . History of prolonged Q-T interval on ECG     a. 09/2012 adm: QTC 506.   Past Surgical History  Procedure Laterality Date  . Ventral hernia repair  02/05/05    with mesh  . Cesarean section    . Hernia repair     Family History  Problem Relation Age of Onset  . Other Father     killed in [place] civil war when he was in his 3550's  . Asthma Mother     alive in her 1560's  . Other Sister     A & W  . Other Sister     A & W  . Heart disease Neg Hx    History  Substance Use Topics  . Smoking status: Never Smoker   . Smokeless tobacco: Never Used  . Alcohol Use: No   OB History   Grav Para Term Preterm Abortions  TAB SAB Ect Mult Living                 Review of Systems  Constitutional:       Per HPI, otherwise negative  HENT:       Per HPI, otherwise negative  Respiratory:       Per HPI, otherwise negative  Cardiovascular:       Per HPI, otherwise negative  Gastrointestinal: Negative for vomiting.  Endocrine:       Negative aside from HPI  Genitourinary:       Neg aside from HPI   Musculoskeletal:       Per HPI, otherwise negative  Skin: Negative.   Neurological: Negative for syncope.      Allergies  Review of patient's allergies indicates no known allergies.  Home Medications   Prior to Admission medications   Medication Sig Start Date End Date Taking? Authorizing Provider  aspirin 325 MG EC tablet Take 325 mg by mouth daily.      Historical Provider, MD  diltiazem (CARDIZEM CD) 180 MG 24 hr capsule Take 180 mg by mouth daily.      Historical Provider, MD  flecainide (  TAMBOCOR) 50 MG tablet Take 1 tablet (50 mg total) by mouth 2 (two) times daily. 12/30/12   Lonia Blood, MD  ibuprofen (ADVIL,MOTRIN) 200 MG tablet Take 400 mg by mouth every 6 (six) hours as needed for mild pain.    Historical Provider, MD  insulin aspart (NOVOLOG) 100 UNIT/ML injection Inject 5 Units into the skin 3 (three) times daily before meals.      Historical Provider, MD  insulin aspart (NOVOLOG) 100 UNIT/ML injection Inject 8 Units into the skin 3 (three) times daily with meals. 12/30/12   Lonia Blood, MD  Insulin Glargine (LANTUS SOLOSTAR [place]) Inject 30 Units into the skin at bedtime.      Historical Provider, MD  insulin glargine (LANTUS) 100 UNIT/ML injection Inject 0.55 mLs (55 Units total) into the skin at bedtime. 12/30/12   Lonia Blood, MD  lisinopril (PRINIVIL,ZESTRIL) 5 MG tablet Take 1 tablet (5 mg total) by mouth daily. 12/30/12   Lonia Blood, MD  metFORMIN (GLUCOPHAGE) 500 MG tablet Take 1 tablet (500 mg total) by mouth 2 (two) times daily with a meal. 12/30/12   Lonia Blood, MD  Rivaroxaban (XARELTO) 20 MG TABS tablet Take 1 tablet (20 mg total) by mouth daily with supper. 12/30/12   Lonia Blood, MD  traMADol (ULTRAM) 50 MG tablet Take 1 tablet (50 mg total) by mouth every 6 (six) hours as needed. 09/20/13   Dione Booze, MD  warfarin (COUMADIN) 5 MG tablet Take 5 mg by mouth daily.      Historical Provider, MD   BP 173/85  Pulse 76  Temp(Src) 98.2 F (36.8 C) (Oral)  Resp 17  Ht 5\' 6"  (1.676 m)  Wt 187 lb (84.823 kg)  BMI 30.20 kg/m2  SpO2 99% Physical Exam  Nursing note and vitals reviewed. Constitutional: She is oriented to person, place, and time. She appears well-developed and well-nourished. No distress.  HENT:  Head: Normocephalic and atraumatic.  Eyes: Conjunctivae and EOM are normal.  Cardiovascular: Normal rate and regular rhythm.   Pulmonary/Chest: Effort normal and breath sounds normal. No stridor. No respiratory distress.  Abdominal: She exhibits no distension.  Musculoskeletal: She exhibits no edema.  Neurological: She is alert and oriented to person, place, and time. No cranial nerve deficit.  Skin: Skin is warm and dry.  Psychiatric: She has a normal mood and affect.    ED Course  Procedures (including critical care time) Labs Review Labs Reviewed  CBC - Abnormal; Notable for the following:    MCV 77.0 (*)    MCH 25.3 (*)    All other components within normal limits  BASIC METABOLIC PANEL  I-STAT TROPOININ, ED    Imaging Review Dg Chest Port 1 View  10/29/2013   CLINICAL DATA:  Right-sided chest pain beginning 2 days ago. Pain sharp. Upper back pain.  EXAM: PORTABLE CHEST - 1 VIEW  COMPARISON:  10/20/2010  FINDINGS: Heart is borderline in size. Lungs are clear. No effusions. No acute bony abnormality.  IMPRESSION: No active disease.   Electronically Signed   By: Charlett Nose M.D.   On: 10/29/2013 01:27    EKG with sinus rhythm, rate 81, P-wave enlargement, abnormal  I reviewed the EMR. Patient has had  multiple evaluations for right-sided chest pain going back months. Patient has previously been advised to be compliant with all medications, and to initiate therapy with PPI.   3:00 AM Under examination patient is calm. She states that she is  not currently taking her anticoagulant, nor is she taking the PPI though she was recently advised to take. She denies any additional complaints, states that she will follow up with primary care, nephrology, cardiology.   MDM   Patient presents with chest pain.  Patient does have a history of atrial fibrillation, though there is no evidence for arrhythmia currently. There is also no evidence for ischemia, bacteremia, sepsis, pneumonia, pneumothorax. Patient recognizes her own medication noncompliance.  However, even without current anticoagulant use, there is low suspicion for ongoing ischemia. Patient has no dyspnea, findings consistent with pulmonary embolism. Patient acknowledged the need for medication compliance, was discharged with new prescriptions, will follow with cardiology and gastroenterology for possible gastroesophageal disease.   Gerhard Munchobert Cristi Gwynn, MD 10/29/13 [phone]

## 2013-12-11 ENCOUNTER — Telehealth: Payer: Self-pay | Admitting: Cardiovascular Disease

## 2013-12-11 NOTE — Telephone Encounter (Signed)
Calling stating she has been having CP all week end off and on.  States she rated it on scale of 8 out of 10.  States pain worse when she takes a deep breath. Today she still feels pressure and pain still at an 8. When questioned she states pain is same as CP she has had at last 2 ER visits   ( 9/16 and 10/25).  States she is taking all of her medications.  She is on Xarelto, Coumadin, ASA, ibuprofen and Tramadol.  Spoke w/Lori Gerhardt,NP/flex who reviewed her chart from ER visit.  CXR was normal.  She advised for pt to be seen by her PCP to evaluate chest wall pain and also to review all of her meds. [facility] Advised for her to call her PCP and make sure she takes all of her meds to OV.  Also advised to bring her meds for visit next Monday w/Michele. She verbalizes understanding and will call her PCP.

## 2013-12-11 NOTE — Telephone Encounter (Signed)
New message   Pt called states that she has had chest pain over the weekend. Her chest pains over a scale of 1-10 is an 8 over the weekend. Pt is requesting a call back to disucss.. Made an appt with Jacolyn ReedyMichele Lenze for 12/11/2013. Advised pt that a triage nurse would give her a call back.

## 2013-12-13 ENCOUNTER — Encounter: Payer: Self-pay | Admitting: Physician Assistant

## 2013-12-13 ENCOUNTER — Ambulatory Visit (INDEPENDENT_AMBULATORY_CARE_PROVIDER_SITE_OTHER): Payer: Self-pay | Admitting: Physician Assistant

## 2013-12-13 VITALS — BP 130/88 | HR 82 | Ht 66.0 in | Wt 178.8 lb

## 2013-12-13 DIAGNOSIS — R0789 Other chest pain: Secondary | ICD-10-CM

## 2013-12-13 DIAGNOSIS — R079 Chest pain, unspecified: Secondary | ICD-10-CM | POA: Insufficient documentation

## 2013-12-13 DIAGNOSIS — I4891 Unspecified atrial fibrillation: Secondary | ICD-10-CM

## 2013-12-13 DIAGNOSIS — Z9119 Patient's noncompliance with other medical treatment and regimen: Secondary | ICD-10-CM

## 2013-12-13 DIAGNOSIS — Z91199 Patient's noncompliance with other medical treatment and regimen due to unspecified reason: Secondary | ICD-10-CM

## 2013-12-13 NOTE — Progress Notes (Signed)
HPI: This is a 43 year old female patient of Dr. Eden EmmsNishan who has history of hypertension, diabetes mellitus, and paroxysmal atrial fibrillation dating back to 2013. She has been treated with flecainide and Xarelto Chadvasc score is 3. Follow-up ETT was arranged but she did not show. Patient actually stopped taking Xarelto. She finally had a follow-up ETT on 10/31/12 that was normal with no proarrhythmia. She last saw Dr. Eden EmmsNishan in 11/2012 at which time he recommended resuming Xarelto.  2-D echo on 05/2012 showed mild focal basal hypertrophy of the septum, vigorous systolic function EF 65-70% with mildly dilated LA.  The patient has been to the emergency room twice recently in September and October of this year. She complains of ongoing chest pain. She was in normal sinus rhythm and EKGs were without acute change troponins negative. Patient lost her job at [employer] and has not been able to afford any of her medications for about 6 months. She rents an apartment with her 2 children and has no other support. She recently got a job and will have insurance beginning in January. She continues to have constant chest pain described as an ache, sometimes worse with a deep breath. At times she has rapid heartbeats as well as chest tightness in her left or right chest going down her left arm. This comes and goes. It occurs at rest and with exertion.   No Known Allergies   Current Outpatient Prescriptions  Medication Sig Dispense Refill  . aspirin 325 MG EC tablet Take 325 mg by mouth daily.      Marland Kitchen. diltiazem (CARDIZEM CD) 180 MG 24 hr capsule Take 180 mg by mouth daily.      . insulin aspart (NOVOLOG) 100 UNIT/ML injection Inject 5 Units into the skin 3 (three) times daily before meals.      . insulin glargine (LANTUS) 100 UNIT/ML injection Inject 55 Units into the skin at bedtime.    Marland Kitchen. lisinopril (PRINIVIL,ZESTRIL) 5 MG tablet Take 1 tablet (5 mg total) by mouth daily. 30 tablet 0  .  metFORMIN (GLUCOPHAGE) 500 MG tablet Take 1 tablet (500 mg total) by mouth 2 (two) times daily with a meal. 60 tablet 0  . omeprazole (PRILOSEC) 20 MG capsule Take 1 capsule (20 mg total) by mouth daily. 20 capsule 0  . traMADol (ULTRAM) 50 MG tablet   0  . XARELTO STARTER PACK 15 & 20 MG TBPK Take 15-20 mg by mouth as directed. Take as directed on package: Start with one 15mg  tablet by mouth twice a day with food. On Day 22, switch to one 20mg  tablet once a day with food. 51 each 0  . [DISCONTINUED] warfarin (COUMADIN) 5 MG tablet Take 5 mg by mouth daily.      No current facility-administered medications for this visit.    Past Medical History  Diagnosis Date  . Diabetes mellitus     uncontrolled  . Hx gestational diabetes   . Small bowel obstruction 09/2010    resolved  . Atrial fibrillation with rapid ventricular response     PAF new onset  . Diabetes mellitus     a. 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 b. 05/2012 A1c 12.6.  . Hypertension   . PAF (paroxysmal atrial fibrillation)     a. 07/2011 ED visit with AF-> converted with flecainide, never followed-up;  b. 05/2012 Recurrent Afib -echo: EF 65-60%, mildly dil LA. Placed on Xarelto/daily Flecainide. c. Recurrent  AF 09/2012: spont conv on dilt.  . Noncompliance   . History of prolonged Q-T interval on ECG     a. 09/2012 adm: QTC 506.    Past Surgical History  Procedure Laterality Date  . Ventral hernia repair  02/05/05    with mesh  . Cesarean section    . Hernia repair      Family History  Problem Relation Age of Onset  . Other Father     killed in [place] when he was in his 2550's  . Asthma Mother     alive in her 3460's  . Other Sister     A & W  . Other Sister     A & W  . Heart disease Neg Hx     History   Social History  . Marital Status: Married    Spouse Name: N/A    Number of Children: 2  . 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 Smoking status: Never Smoker   . Smokeless tobacco: Never Used  . Alcohol Use: No  . Drug Use: No  . Sexual Activity: Not on file   Other Topics Concern  . Not on file   Social History Narrative   ** Merged History Encounter **       ** Data from: 11/07/10 Enc Dept: [facility]   Pt has 6 sisters but doe not know their health history or status.      Mother and father unknown      Pt is an immigrant from [place], [place] in [place], [place] by herself           ** Data from: 09/19/13 Enc [facility]: [facility]   Lives in [place] with significant other.  Works @ [employer] in [employer].  Does not routinely exercise.    ROS: See history of present illness otherwise negative  BP 130/88 mmHg  Pulse 82  Ht 5\' 6"  (1.676 m)  Wt 178 lb 12.8 oz (81.103 kg)  BMI 28.87 kg/m2  SpO2 97%  PHYSICAL EXAM: Well-nournished, in no acute distress. Neck: No JVD, HJR, Bruit, or thyroid enlargement  Lungs: No tachypnea, clear without wheezing, rales, or rhonchi  Cardiovascular: RRR, PMI not displaced, Normal S1 and S2, no murmurs, gallops, bruit, thrill, or heave.  Abdomen: BS normal. Soft without organomegaly, masses, lesions or tenderness.  Extremities: without cyanosis, clubbing or edema. Good distal pulses bilateral  SKin: Warm, no lesions or rashes   Musculoskeletal: No deformities  Neuro: no focal signs   Wt Readings from Last 3 Encounters:  10/29/13 187 lb (84.823 kg)  08/29/13 187 lb (84.823 kg)  12/30/12 186 lb 4.6 oz (84.5 kg)    Lab Results  Component Value Date   WBC 9.3 10/29/2013   HGB 12.2 10/29/2013   HCT 37.2 10/29/2013   PLT 298 10/29/2013   GLUCOSE 468* 10/29/2013   CHOL 100 05/24/2012   TRIG 104 05/24/2012   HDL 47 05/24/2012   LDLCALC 32 05/24/2012   ALT 15 10/21/2010   AST 16 10/21/2010   NA 133* 10/29/2013   K 4.2 10/29/2013   CL 92* 10/29/2013   CREATININE 0.82 10/29/2013   BUN 12 10/29/2013   CO2  25 10/29/2013   TSH 0.895 12/29/2012   INR 1.07 12/29/2012   HGBA1C 11.9* 12/29/2012    EKG: Normal sinus rhythm

## 2013-12-13 NOTE — Patient Instructions (Addendum)
Your physician has recommended you make the following change in your medication:    TAKE XERALTO 20 MG   TAKE FLECAINIDE 50 MG TWICE A DAY     PHYSICIAN  RECOMMENDS  YOUR TO GET A STRESS MYOVIEW AND 2D ECHO WHEN YOU HAVE ESTABLISH A FORM OF  INSURANCE  CONTACT OFFICE WHEN THAT HAPPENS   Your physician recommends that you schedule a follow-up appointment in:  WITH DR Eden EmmsNISHAN IN ONE MONTH   PLEASE CONTACT FOR ASSISTANCE WITH NOT HAVING INSURANCE AND TO GET MEDICATIONS    [facility]  Address: [address] [place], [place], [place] [address]  [phone]

## 2013-12-13 NOTE — Assessment & Plan Note (Signed)
Patient has several different types of chest pain including a constant aching as well as a tightness going down her left arm and into her right chest at times as well as when she has rapid heartbeats. She's been to the emergency room twice recently with this and troponins and EKGs have been stable. Recommend stress Myoview and 2-D echo. Patient has no insurance at this time and probably will not schedule these tests to the first of the year. I had along discussion with patient concerning compliance.

## 2013-12-13 NOTE — Assessment & Plan Note (Signed)
I had along discussion with patient concerning compliance. We have referred her to the community health and wellness to help with medication assistance until she can afford it.

## 2013-12-13 NOTE — Assessment & Plan Note (Signed)
Patient has had recurrent palpitations and is not taking Xarelto were flecainide because she cannot afford it. She is in normal sinus rhythm today was in normal sinus rhythm in the emergency room. We will refer her to community health and wellness to help with medications until her insurance starts next month.

## 2013-12-18 ENCOUNTER — Ambulatory Visit: Payer: Self-pay | Admitting: Physician Assistant

## 2014-01-17 ENCOUNTER — Other Ambulatory Visit: Payer: Self-pay | Admitting: Cardiovascular Disease

## 2014-02-15 ENCOUNTER — Emergency Department (HOSPITAL_COMMUNITY)
Admission: EM | Admit: 2014-02-15 | Discharge: 2014-02-15 | Disposition: A | Discharge status: Home / Self Care | Payer: 59 | Attending: Emergency Medicine | Admitting: Emergency Medicine

## 2014-02-15 ENCOUNTER — Emergency Department (HOSPITAL_COMMUNITY): Payer: 59

## 2014-02-15 ENCOUNTER — Encounter (HOSPITAL_COMMUNITY): Payer: Self-pay | Admitting: Emergency Medicine

## 2014-02-15 DIAGNOSIS — I4891 Unspecified atrial fibrillation: Secondary | ICD-10-CM | POA: Insufficient documentation

## 2014-02-15 DIAGNOSIS — E119 Type 2 diabetes mellitus without complications: Secondary | ICD-10-CM | POA: Insufficient documentation

## 2014-02-15 DIAGNOSIS — R61 Generalized hyperhidrosis: Secondary | ICD-10-CM | POA: Insufficient documentation

## 2014-02-15 DIAGNOSIS — R0789 Other chest pain: Secondary | ICD-10-CM | POA: Insufficient documentation

## 2014-02-15 DIAGNOSIS — I10 Essential (primary) hypertension: Secondary | ICD-10-CM | POA: Insufficient documentation

## 2014-02-15 DIAGNOSIS — Z9114 Patient's other noncompliance with medication regimen: Secondary | ICD-10-CM | POA: Insufficient documentation

## 2014-02-15 DIAGNOSIS — N644 Mastodynia: Secondary | ICD-10-CM | POA: Insufficient documentation

## 2014-02-15 DIAGNOSIS — Z7982 Long term (current) use of aspirin: Secondary | ICD-10-CM | POA: Insufficient documentation

## 2014-02-15 DIAGNOSIS — Z79899 Other long term (current) drug therapy: Secondary | ICD-10-CM | POA: Insufficient documentation

## 2014-02-15 DIAGNOSIS — Z8719 Personal history of other diseases of the digestive system: Secondary | ICD-10-CM | POA: Insufficient documentation

## 2014-02-15 DIAGNOSIS — R079 Chest pain, unspecified: Secondary | ICD-10-CM

## 2014-02-15 LAB — I-STAT TROPONIN, ED: Troponin i, poc: 0 ng/mL (ref 0.00–0.08)

## 2014-02-15 LAB — CBC
HCT: 36.3 % (ref 36.0–46.0)
Hemoglobin: 12 g/dL (ref 12.0–15.0)
MCH: 25.4 pg — AB (ref 26.0–34.0)
MCHC: 33.1 g/dL (ref 30.0–36.0)
MCV: 76.9 fL — ABNORMAL LOW (ref 78.0–100.0)
PLATELETS: 277 10*3/uL (ref 150–400)
RBC: 4.72 MIL/uL (ref 3.87–5.11)
RDW: 14 % (ref 11.5–15.5)
WBC: 8.9 10*3/uL (ref 4.0–10.5)

## 2014-02-15 LAB — BASIC METABOLIC PANEL
ANION GAP: 12 (ref 5–15)
BUN: 13 mg/dL (ref 6–23)
CALCIUM: 9.2 mg/dL (ref 8.4–10.5)
CO2: 23 mmol/L (ref 19–32)
CREATININE: 0.69 mg/dL (ref 0.50–1.10)
Chloride: 98 mmol/L (ref 96–112)
GFR calc non Af Amer: >90 mL/min (ref >90)
Glucose, Bld: 295 mg/dL — ABNORMAL HIGH (ref 70–99)
Potassium: 3.3 mmol/L — ABNORMAL LOW (ref 3.5–5.1)
Sodium: 133 mmol/L — ABNORMAL LOW (ref 135–145)

## 2014-02-15 LAB — BRAIN NATRIURETIC PEPTIDE: B Natriuretic Peptide: 16.5 pg/mL (ref 0.0–100.0)

## 2014-02-15 MED ORDER — POTASSIUM CHLORIDE CRYS ER 20 MEQ PO TBCR
20.0000 meq | EXTENDED_RELEASE_TABLET | Freq: Once | ORAL | Status: AC
  Administered 2014-02-15: 20 meq via ORAL
  Filled 2014-02-15: qty 1

## 2014-02-15 NOTE — ED Notes (Signed)
Patient is alert and orientedx4.  Patient was explained discharge instructions and they understood them with no questions.  The patient's friend, Clyda GreenerLionel Andrews is taking the patient home.

## 2014-02-15 NOTE — ED Notes (Signed)
Pt. reports mid chest and breasts pain onset this evening with mild SOB , pain increases with deep inspiration , denies nausea or diaphoresis .

## 2014-02-15 NOTE — Discharge Instructions (Signed)
Please call your cardiologist office in the morning.  She says he concerns over your medication Please call the wellness Center in the morning to set an appointment with a primary care physician. Chest Pain (Nonspecific) It is often hard to give a diagnosis for the cause of chest pain. There is always a chance that your pain could be related to something serious, such as a heart attack or a blood clot in the lungs. You need to follow up with your doctor. HOME CARE  If antibiotic medicine was given, take it as directed by your doctor. Finish the medicine even if you start to feel better.  For the next few days, avoid activities that bring on chest pain. Continue physical activities as told by your doctor.  Do not use any tobacco products. This includes cigarettes, chewing tobacco, and e-cigarettes.  Avoid drinking alcohol.  Only take medicine as told by your doctor.  Follow your doctor's suggestions for more testing if your chest pain does not go away.  Keep all doctor visits you made. GET HELP IF:  Your chest pain does not go away, even after treatment.  You have a rash with blisters on your chest.  You have a fever. GET HELP RIGHT AWAY IF:   You have more pain or pain that spreads to your arm, neck, jaw, back, or belly (abdomen).  You have shortness of breath.  You cough more than usual or cough up blood.  You have very bad back or belly pain.  You feel sick to your stomach (nauseous) or throw up (vomit).  You have very bad weakness.  You pass out (faint).  You have chills. This is an emergency. Do not wait to see if the problems will go away. Call your local emergency services (911 in [place] Do not drive yourself to the hospital. MAKE SURE YOU:   Understand these instructions.  Will watch your condition.  Will get help right away if you are not doing well or get worse. Document Released: 06/10/2007 Document Revised: 12/27/2012 Document Reviewed:  06/10/2007 [facility] Patient Information 2015 [employer] [place]. This information is not intended to replace advice given to you by your health care provider. Make sure you discuss any questions you have with your health care provider.

## 2014-02-15 NOTE — ED Notes (Signed)
Patient transported to X-ray 

## 2014-02-15 NOTE — ED Provider Notes (Signed)
CSN: [id number]     Arrival date & time 02/15/14  1959 History   First MD Initiated Contact with Patient 02/15/14 2049     Chief Complaint  Patient presents with  . Chest Pain     (Consider location/radiation/quality/duration/timing/severity/associated sxs/prior Treatment) HPI Comments: Pateint with Hx A-fin on Flecanide has not taken the medication in over a week due to cost now with 2 days of chest and breast pain worse with inspiration.   Patient is a 44 y.o. female presenting with chest pain. The history is provided by the patient.  Chest Pain Pain location:  Substernal area, L chest and R chest Pain quality: throbbing   Pain radiates to:  Does not radiate Pain radiates to the back: no   Pain severity:  Mild Onset quality:  Gradual Duration:  2 days Timing:  Constant Progression:  Worsening Chronicity:  Recurrent Context: breathing   Context: not lifting, no movement, not raising an arm, not at rest and no stress   Relieved by:  None tried Worsened by:  Nothing tried Ineffective treatments:  None tried Associated symptoms: diaphoresis   Associated symptoms: no altered mental status, no anxiety, no back pain, no cough, no dizziness, no fever, no nausea, no numbness, no palpitations and no shortness of breath   Risk factors: diabetes mellitus and hypertension   Risk factors: no birth control, no smoking and no surgery     Past Medical History  Diagnosis Date  . Diabetes mellitus     uncontrolled  . Hx gestational diabetes   . Small bowel obstruction 09/2010    resolved  . Atrial fibrillation with rapid ventricular response     PAF new onset  . Diabetes mellitus     a. 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 b. 05/2012 A1c 12.6.  . Hypertension   . PAF (paroxysmal atrial fibrillation)     a. 07/2011 ED visit with AF-> converted with flecainide, never followed-up;  b. 05/2012 Recurrent Afib -echo: EF 65-60%, mildly dil LA. Placed on  Xarelto/daily Flecainide. c. 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  . Noncompliance   . History of prolonged Q-T interval on ECG     a. 09/2012 adm: QTC 506.   Past Surgical History  Procedure Laterality Date  . Ventral hernia repair  02/05/05    with mesh  . Cesarean section    . Hernia repair     Family History  Problem Relation Age of Onset  . Other Father     killed in [place] war when he was in his 6250's  . Asthma Mother     alive in her 4060's  . Other Sister     A & W  . Other Sister     A & W  . Heart disease Neg Hx    History  Substance Use Topics  . Smoking status: Never Smoker   . Smokeless tobacco: Never Used  . Alcohol Use: No   OB History    No data available     Review of Systems  Constitutional: Positive for diaphoresis. Negative for fever.  Respiratory: Negative for cough, shortness of breath and wheezing.   Cardiovascular: Positive for chest pain. Negative for palpitations and leg swelling.  Gastrointestinal: Negative for nausea.  Musculoskeletal: Negative for back pain.  Skin: Negative for rash.  Neurological: Negative for dizziness and numbness.  All other systems reviewed and are negative.     Allergies  Review of  patient's allergies indicates no known allergies.  Home Medications   Prior to Admission medications   Medication Sig Start Date End Date Taking? Authorizing Provider  aspirin 81 MG tablet Take 81 mg by mouth daily.   Yes Historical Provider, MD  flecainide (TAMBOCOR) 50 MG tablet TAKE 1 TABLET (50 MG TOTAL) BY MOUTH 2 (TWO) TIMES DAILY. 01/18/14  Yes Wendall Stade, MD  lisinopril (PRINIVIL,ZESTRIL) 5 MG tablet Take 1 tablet (5 mg total) by mouth daily. 12/30/12  Yes Lonia Blood, MD  metFORMIN (GLUCOPHAGE) 500 MG tablet Take 1 tablet (500 mg total) by mouth 2 (two) times daily with a meal. 12/30/12  Yes Lonia Blood, MD   BP 155/85 mmHg  Pulse 73  Temp(Src) 98 F (36.7 C) (Oral)  Resp 18  Ht  (1.676 m)   SpO2 100% Physical Exam  Constitutional: She is oriented to person, place, and time. She appears well-developed and well-nourished.  HENT:  Head: Normocephalic.  Eyes: Pupils are equal, round, and reactive to light.  Neck: Normal range of motion.  Cardiovascular: Normal rate and regular rhythm.   Pulmonary/Chest: Effort normal and breath sounds normal.  Musculoskeletal: Normal range of motion.  Neurological: She is alert and oriented to person, place, and time.  Skin: Skin is warm.  Nursing note and vitals reviewed.   ED Course  Procedures (including critical care time) Labs Review Labs Reviewed  CBC - Abnormal; Notable for the following:    MCV 76.9 (*)    MCH 25.4 (*)    All other components within normal limits  BASIC METABOLIC PANEL - Abnormal; Notable for the following:    Sodium 133 (*)    Potassium 3.3 (*)    Glucose, Bld 295 (*)    All other components within normal limits  BRAIN NATRIURETIC PEPTIDE  I-STAT TROPOININ, ED    Imaging Review Dg Chest 2 View  02/15/2014   CLINICAL DATA:  Acute onset of mid chest pain and mild shortness of breath. Initial encounter.  EXAM: CHEST  2 VIEW  COMPARISON:  Chest radiograph performed 10/29/2013  FINDINGS: The lungs are well-aerated. Mild peribronchial thickening is noted. There is no evidence of focal opacification, pleural effusion or pneumothorax.  The heart is normal in size; the mediastinal contour is within normal limits. No acute osseous abnormalities are seen.  IMPRESSION: Mild peribronchial thickening noted; lungs otherwise grossly clear.   Electronically Signed   By: Roanna Raider M.D.   On: 02/15/2014 21:42     EKG Interpretation None     Patient also states that she had an IUD removed over a year ago and has not had a menstrual cycle since she is sexually active with no form of birth control Patient states she can fill the Rx for her medication in the morning and wil call her cardiologist as well as the [facility] to obtain PCP MDM   Final diagnoses:  Atypical chest pain  Noncompliance with medication regimen         Arman Filter, NP 02/16/14 1953  Ethelda Chick, MD 02/17/14 1610  Ethelda Chick, MD 02/17/14 [phone]

## 2014-03-07 ENCOUNTER — Ambulatory Visit: Payer: Self-pay | Admitting: Cardiovascular Disease

## 2014-03-07 ENCOUNTER — Encounter: Payer: Self-pay | Admitting: Cardiovascular Disease

## 2014-03-07 ENCOUNTER — Ambulatory Visit (INDEPENDENT_AMBULATORY_CARE_PROVIDER_SITE_OTHER): Payer: No Typology Code available for payment source | Admitting: Cardiovascular Disease

## 2014-03-07 VITALS — BP 146/87 | HR 87 | Ht 66.0 in | Wt 175.0 lb

## 2014-03-07 DIAGNOSIS — E118 Type 2 diabetes mellitus with unspecified complications: Secondary | ICD-10-CM

## 2014-03-07 DIAGNOSIS — R9431 Abnormal electrocardiogram [ECG] [EKG]: Secondary | ICD-10-CM

## 2014-03-07 DIAGNOSIS — R079 Chest pain, unspecified: Secondary | ICD-10-CM

## 2014-03-07 DIAGNOSIS — R011 Cardiac murmur, unspecified: Secondary | ICD-10-CM | POA: Insufficient documentation

## 2014-03-07 DIAGNOSIS — I10 Essential (primary) hypertension: Secondary | ICD-10-CM

## 2014-03-07 DIAGNOSIS — I4891 Unspecified atrial fibrillation: Secondary | ICD-10-CM

## 2014-03-07 NOTE — Assessment & Plan Note (Signed)
Well controlled.  Continue current medications and low sodium Dash type diet.   Continue ACE 

## 2014-03-07 NOTE — Assessment & Plan Note (Signed)
>>  ASSESSMENT AND PLAN FOR ESSENTIAL HYPERTENSION WRITTEN ON 03/07/2014 10:56 AM BY DELFORD MAUDE BROCKS, MD  Well controlled.  Continue current medications and low sodium Dash type diet.   Continue ACE

## 2014-03-07 NOTE — Addendum Note (Signed)
Addended by: Demetrios LollVIA, PATRICIA M on: 03/07/2014 11:03 AM   Modules accepted: Orders, Level of Service

## 2014-03-07 NOTE — Assessment & Plan Note (Signed)
Persistant , multiple risk factors including DM  On flecainide with nonspecific ST changes f/u stress myovue

## 2014-03-07 NOTE — Assessment & Plan Note (Signed)
Discussed low carb diet.  Target hemoglobin A1c is 6.5 or less.  Continue current medications.  

## 2014-03-07 NOTE — Patient Instructions (Signed)
**Note De-Identified [name] Obfuscation** Your physician recommends that you continue on your current medications as directed. Please refer to the Current Medication list given to you today.  Your physician has requested that you have en exercise stress myoview. For further information please visit [email or web address]. Please follow instruction sheet, as given.  Your physician has requested that you have an echocardiogram. Echocardiography is a painless test that uses sound waves to create images of your heart. It provides your doctor with information about the size and shape of your heart and how well your heart's chambers and valves are working. This procedure takes approximately one hour. There are no restrictions for this procedure.  Your physician wants you to follow-up in: 1 year. You will receive a reminder letter in the mail two months in advance. If you don't receive a letter, please call our office to schedule the follow-up appointment.

## 2014-03-07 NOTE — Progress Notes (Signed)
Patient ID: Sarah HeroAngeline T [name], female   DOB: 16-Jan-1970, 44 y.o.   MRN: [id number]             HPI: This is a 44 year old female patient who has history of hypertension, diabetes mellitus, and paroxysmal atrial fibrillation dating back to 2013. She has been treated with flecainide and Xarelto Chadvasc score is 3. Follow-up ETT was arranged but she did not show. Patient actually stopped taking Xarelto. She finally had a follow-up ETT on 10/31/12 that was normal with no proarrhythmia. She last saw Dr. Eden EmmsNishan in 11/2012 at which time he recommended resuming Xarelto.  2-D echo on 05/2012 showed mild focal basal hypertrophy of the septum, vigorous systolic function EF 65-70% with mildly dilated LA.  The patient has been to the emergency room twice recently in September and October of this year. She complains of ongoing chest pain. She was in normal sinus rhythm and EKGs were without acute change troponins negative. Patient lost her job at [employer] and has not been able to afford any of her medications for about 6 months. She rents an apartment with her 2 children and has no other support. She recently got a job and will have insurance beginning in January. She continues to have constant chest pain described as an ache, sometimes worse with a deep breath. At times she has rapid heartbeats as well as chest tightness in her left or right chest going down her left arm. This comes and goes. It occurs at rest and with exertion.  Myovue and Echo ordered last visit but patient failed to schedule has no insurance  Still having chest pains atypical and typical features  Seeing [facility] but BS seems Poorly controlled   No Known Allergies   Current Outpatient Prescriptions  Medication Sig Dispense Refill  . aspirin 81 MG tablet Take 81 mg by mouth daily.    . flecainide (TAMBOCOR) 50 MG tablet TAKE 1 TABLET (50 MG TOTAL) BY MOUTH 2 (TWO) TIMES DAILY. 60 tablet 1  . lisinopril (PRINIVIL,ZESTRIL) 5 MG tablet  Take 1 tablet (5 mg total) by mouth daily. 30 tablet 0  . metFORMIN (GLUCOPHAGE) 500 MG tablet Take 1 tablet (500 mg total) by mouth 2 (two) times daily with a meal. 60 tablet 0  . [DISCONTINUED] warfarin (COUMADIN) 5 MG tablet Take 5 mg by mouth daily.      No current facility-administered medications for this visit.    Past Medical History  Diagnosis Date  . Diabetes mellitus     uncontrolled  . Hx gestational diabetes   . Small bowel obstruction 09/2010    resolved  . Atrial fibrillation with rapid ventricular response     PAF new onset  . Diabetes mellitus     a. 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 b. 05/2012 A1c 12.6.  . Hypertension   . PAF (paroxysmal atrial fibrillation)     a. 07/2011 ED visit with AF-> converted with flecainide, never followed-up;  b. 05/2012 Recurrent Afib -echo: EF 65-60%, mildly dil LA. Placed on Xarelto/daily Flecainide. c. 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  . Noncompliance   . History of prolonged Q-T interval on ECG     a. 09/2012 adm: QTC 506.    Past Surgical History  Procedure Laterality Date  . Ventral hernia repair  02/05/05    with mesh  . Cesarean section    . Hernia repair      Family History  Problem  Relation Age of Onset  . Other Father     killed in [place] civil war when he was in his 28's  . Asthma Mother     alive in her 49's  . Other Sister     A & W  . Other Sister     A & W  . Heart disease Neg Hx     History   Social History  . Marital Status: Married    Spouse Name: N/A  . Number of Children: 2  . Years of Education: N/A   Occupational History  . HOUSEKEEPING [employer]   Social History Main Topics  . Smoking status: Never Smoker   . Smokeless tobacco: Never Used  . Alcohol Use: No  . Drug Use: No  . Sexual Activity: Not on file   Other Topics Concern  . Not on file   Social History Narrative   ** Merged History Encounter **       ** Data from: 11/07/10 Enc Dept:  [facility]   Pt has 6 sisters but doe not know their health history or status.      Mother and father unknown      Pt is an immigrant from [place], lives in [place], [place] by herself           ** Data from: 09/19/13 Enc Dept: [facility]   Lives in [place] with significant other.  Works @ [employer] in [employer].  Does not routinely exercise.    ROS: See history of present illness otherwise negative  BP 146/87 mmHg  Pulse 87  Ht  (1.676 m)  Wt 79.379 kg (175 lb)  BMI 28.26 kg/m2  PHYSICAL EXAM: Well-nournished, in no acute distress. Neck: No JVD, HJR, Bruit, or thyroid enlargement  Lungs: No tachypnea, clear without wheezing, rales, or rhonchi  Cardiovascular: RRR, PMI not displaced, Normal S1 and S2, no murmurs, gallops, bruit, thrill, or heave.  Abdomen: BS normal. Soft without organomegaly, masses, lesions or tenderness.  Extremities: without cyanosis, clubbing or edema. Good distal pulses bilateral  SKin: Warm, no lesions or rashes   Musculoskeletal: No deformities  Neuro: no focal signs   Wt Readings from Last 3 Encounters:  03/07/14 79.379 kg (175 lb)  12/13/13 81.103 kg (178 lb 12.8 oz)  10/29/13 84.823 kg (187 lb)    Lab Results  Component Value Date   WBC 8.9 02/15/2014   HGB 12.0 02/15/2014   HCT 36.3 02/15/2014   PLT 277 02/15/2014   GLUCOSE 295* 02/15/2014   CHOL 100 05/24/2012   TRIG 104 05/24/2012   HDL 47 05/24/2012   LDLCALC 32 05/24/2012   ALT 15 10/21/2010   AST 16 10/21/2010   NA 133* 02/15/2014   K 3.3* 02/15/2014   CL 98 02/15/2014   CREATININE 0.69 02/15/2014   BUN 13 02/15/2014   CO2 23 02/15/2014   TSH 0.895 12/29/2012   INR 1.07 12/29/2012   HGBA1C 11.9* 12/29/2012    EKG: SR nonspecific ST/T wave changes

## 2014-03-07 NOTE — Assessment & Plan Note (Signed)
Aortic outflow  Tract murmur  F/u echo also r/o HOCM

## 2014-03-07 NOTE — Assessment & Plan Note (Signed)
Maint NSR continue flecainide

## 2014-03-23 ENCOUNTER — Ambulatory Visit (HOSPITAL_BASED_OUTPATIENT_CLINIC_OR_DEPARTMENT_OTHER): Payer: No Typology Code available for payment source | Admitting: Radiology

## 2014-03-23 ENCOUNTER — Ambulatory Visit (HOSPITAL_COMMUNITY): Payer: No Typology Code available for payment source | Attending: Cardiology | Admitting: Radiology

## 2014-03-23 DIAGNOSIS — R011 Cardiac murmur, unspecified: Secondary | ICD-10-CM | POA: Diagnosis not present

## 2014-03-23 DIAGNOSIS — R9431 Abnormal electrocardiogram [ECG] [EKG]: Secondary | ICD-10-CM

## 2014-03-23 DIAGNOSIS — R079 Chest pain, unspecified: Secondary | ICD-10-CM | POA: Diagnosis not present

## 2014-03-23 MED ORDER — TECHNETIUM TC 99M SESTAMIBI GENERIC - CARDIOLITE
11.0000 | Freq: Once | INTRAVENOUS | Status: AC | PRN
  Administered 2014-03-23: 11 via INTRAVENOUS

## 2014-03-23 MED ORDER — TECHNETIUM TC 99M SESTAMIBI GENERIC - CARDIOLITE
33.0000 | Freq: Once | INTRAVENOUS | Status: AC | PRN
  Administered 2014-03-23: 33 via INTRAVENOUS

## 2014-03-23 NOTE — Progress Notes (Signed)
[facility] SITE 3 NUCLEAR MED [address] [place], [place] [address] [phone]    Cardiology Nuclear Med Study  [name] is a 44 y.o. female     MRN : [id number]     DOB: 02/14/1970  Procedure Date: 03/23/2014  Nuclear Med Background Indication for Stress Test:  Evaluation for Ischemia and Post Hospital  2/16 CP History:  No known CAD Cardiac Risk Factors: Hypertension and NIDDM  Symptoms:  Chest Pain (last date of chest discomfort was last month)   Nuclear Pre-Procedure Caffeine/Decaff Intake:  None NPO After: 10:00pm   Lungs:  clear O2 Sat: 96% on room air. IV 0.9% NS with Angio Cath:  22g  IV Site: R Antecubital  IV Started by:  Bonnita LevanJackie Smith, RN  Chest Size (in):  34 Cup Size: B  Height: 5\' 6"  (1.676 m)  Weight:  175 lb (79.379 kg)  BMI:  Body mass index is 28.26 kg/(m^2). Tech Comments:  N/A    Nuclear Med Study 1 or 2 day study: 1 day  Stress Test Type:  Stress  Reading MD: N/A  Order Authorizing Provider:  Charlton HawsPeter Elza Varricchio, MD  Resting Radionuclide: Technetium 6175m Sestamibi  Resting Radionuclide Dose: 11.0 mCi   Stress Radionuclide:  Technetium 6975m Sestamibi  Stress Radionuclide Dose: 33.0 mCi           Stress Protocol Rest HR: 71 Stress HR: 166  Rest BP: 139/89 Stress BP: 215/103  Exercise Time (min): 7:30 METS: 9.3   Predicted Max HR: 177 bpm % Max HR: 93.79 bpm Rate Pressure Product: [id number]   Dose of Adenosine (mg):  n/a Dose of Lexiscan: n/a mg  Dose of Atropine (mg): n/a Dose of Dobutamine: n/a mcg/kg/min (at max HR)  Stress Test Technologist: Nelson ChimesSharon Brooks, BS-ES  Nuclear Technologist:  Doyne Keelonya Yount, CNMT     Rest Procedure:  Myocardial perfusion imaging was performed at rest 45 minutes following the intravenous administration of Technetium 1475m Sestamibi. Rest ECG: NSR - Normal EKG  Stress Procedure:  The patient exercised on the treadmill utilizing the Bruce Protocol for 7:30 minutes. The patient stopped due to fatigue and denied  any chest pain.  Technetium 6875m Sestamibi was injected at peak exercise and myocardial perfusion imaging was performed after a brief delay. Stress ECG: No significant change from baseline ECG  QPS Raw Data Images:  bowel artifact Stress Images:  Normal homogeneous uptake in all areas of the myocardium. Rest Images:  Normal homogeneous uptake in all areas of the myocardium. Subtraction (SDS):  No evidence of ischemia. Transient Ischemic Dilatation (Normal <1.22):  0.86 Lung/Heart Ratio (Normal <0.45):  0.23  Quantitative Gated Spect Images QGS EDV:  96 ml QGS ESV:  25 ml  Impression Exercise Capacity:  Fair exercise capacity. BP Response:  Hypertensive blood pressure response. Clinical Symptoms:  No symptoms. ECG Impression:  No significant ST segment change suggestive of ischemia. Comparison with Prior Nuclear Study: No images to compare  Overall Impression:  Normal stress nuclear study. Significant HTN  LV Ejection Fraction: 74%.  LV Wall Motion:  NL LV Function; NL Wall Motion   Charlton HawsPeter Carel Carrier

## 2014-03-23 NOTE — Progress Notes (Signed)
Echocardiogram performed.  

## 2014-04-04 ENCOUNTER — Other Ambulatory Visit: Payer: Self-pay

## 2014-04-04 DIAGNOSIS — Z1231 Encounter for screening mammogram for malignant neoplasm of breast: Secondary | ICD-10-CM

## 2014-04-13 ENCOUNTER — Ambulatory Visit
Admission: RE | Admit: 2014-04-13 | Discharge: 2014-04-13 | Disposition: A | Discharge status: Home / Self Care | Payer: No Typology Code available for payment source | Source: Ambulatory Visit

## 2014-04-13 DIAGNOSIS — Z1231 Encounter for screening mammogram for malignant neoplasm of breast: Secondary | ICD-10-CM

## 2014-06-07 ENCOUNTER — Emergency Department (HOSPITAL_COMMUNITY): Payer: No Typology Code available for payment source

## 2014-06-07 ENCOUNTER — Emergency Department (HOSPITAL_COMMUNITY)
Admission: EM | Admit: 2014-06-07 | Discharge: 2014-06-07 | Disposition: A | Discharge status: Home / Self Care | Payer: No Typology Code available for payment source | Attending: Emergency Medicine | Admitting: Emergency Medicine

## 2014-06-07 ENCOUNTER — Encounter (HOSPITAL_COMMUNITY): Payer: Self-pay

## 2014-06-07 DIAGNOSIS — R079 Chest pain, unspecified: Secondary | ICD-10-CM | POA: Diagnosis not present

## 2014-06-07 DIAGNOSIS — E119 Type 2 diabetes mellitus without complications: Secondary | ICD-10-CM | POA: Insufficient documentation

## 2014-06-07 DIAGNOSIS — I10 Essential (primary) hypertension: Secondary | ICD-10-CM | POA: Diagnosis not present

## 2014-06-07 DIAGNOSIS — Z8719 Personal history of other diseases of the digestive system: Secondary | ICD-10-CM | POA: Insufficient documentation

## 2014-06-07 DIAGNOSIS — Z7982 Long term (current) use of aspirin: Secondary | ICD-10-CM | POA: Insufficient documentation

## 2014-06-07 DIAGNOSIS — I480 Paroxysmal atrial fibrillation: Secondary | ICD-10-CM | POA: Diagnosis not present

## 2014-06-07 DIAGNOSIS — Z79899 Other long term (current) drug therapy: Secondary | ICD-10-CM | POA: Diagnosis not present

## 2014-06-07 DIAGNOSIS — Z8632 Personal history of gestational diabetes: Secondary | ICD-10-CM | POA: Diagnosis not present

## 2014-06-07 LAB — BASIC METABOLIC PANEL
Anion gap: 12 (ref 5–15)
BUN: 11 mg/dL (ref 6–20)
CALCIUM: 9.6 mg/dL (ref 8.9–10.3)
CHLORIDE: 101 mmol/L (ref 101–111)
CO2: 26 mmol/L (ref 22–32)
CREATININE: 0.79 mg/dL (ref 0.44–1.00)
GFR calc non Af Amer: >60 mL/min (ref >60)
Glucose, Bld: 176 mg/dL — ABNORMAL HIGH (ref 65–99)
POTASSIUM: 3.7 mmol/L (ref 3.5–5.1)
Sodium: 139 mmol/L (ref 135–145)

## 2014-06-07 LAB — CBC
HEMATOCRIT: 34.1 % — AB (ref 36.0–46.0)
HEMOGLOBIN: 11.1 g/dL — AB (ref 12.0–15.0)
MCH: 25.5 pg — AB (ref 26.0–34.0)
MCHC: 32.6 g/dL (ref 30.0–36.0)
MCV: 78.2 fL (ref 78.0–100.0)
PLATELETS: 273 10*3/uL (ref 150–400)
RBC: 4.36 MIL/uL (ref 3.87–5.11)
RDW: 14.2 % (ref 11.5–15.5)
WBC: 9.4 10*3/uL (ref 4.0–10.5)

## 2014-06-07 LAB — I-STAT TROPONIN, ED
TROPONIN I, POC: 0 ng/mL (ref 0.00–0.08)
Troponin i, poc: 0 ng/mL (ref 0.00–0.08)

## 2014-06-07 LAB — BRAIN NATRIURETIC PEPTIDE: B Natriuretic Peptide: 9.3 pg/mL (ref 0.0–100.0)

## 2014-06-07 NOTE — ED Notes (Signed)
Pt remains monitored by blood pressure, pulse ox, and 5 lead.  

## 2014-06-07 NOTE — ED Provider Notes (Signed)
Care from Dr. Karma GanjaLinker. 2nd troponin negative.  1. Chest pain, unspecified chest pain type      Elwin MochaBlair Chrisangel Eskenazi, MD 06/07/14 1733

## 2014-06-07 NOTE — ED Notes (Signed)
Patient transported to X-ray without distress.  

## 2014-06-07 NOTE — ED Notes (Signed)
Pt here for sharp cp since 0900 this morning. States she took some ASA at 1000 but not sure how many mg. The pain comes and goes but it is sharp when it comes. It hurts in her breasts at times also. Has some SOB when walking.

## 2014-06-07 NOTE — ED Provider Notes (Signed)
CSN: [id number]     Arrival date & time 06/07/14  1307 History   First MD Initiated Contact with Patient 06/07/14 1329     Chief Complaint  Patient presents with  . Chest Pain     (Consider location/radiation/quality/duration/timing/severity/associated sxs/prior Treatment) HPI  Pt presenting with c/o chest pain.  Pt has sharp chest pain which is located in her central chest and in both breasts.  She states this is the same pain for which she has been evaluated in the past.  No difficulty breathing.  No cough or fever.  No leg swelling.  No hx of DVT/PE, no recent travel/trauma/surgery.  Pt has hx of DM, HTN, afib. No fainting.  She had negative stress test 2/16 after seeing cardiology.  Pain has been constant since it began this morning.  There are no other associated systemic symptoms, there are no other alleviating or modifying factors.   Past Medical History  Diagnosis Date  . Diabetes mellitus     uncontrolled  . Hx gestational diabetes   . Small bowel obstruction 09/2010    resolved  . Atrial fibrillation with rapid ventricular response     PAF new onset  . Diabetes mellitus     a. 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 b. 05/2012 A1c 12.6.  . Hypertension   . PAF (paroxysmal atrial fibrillation)     a. 07/2011 ED visit with AF-> converted with flecainide, never followed-up;  b. 05/2012 Recurrent Afib -echo: EF 65-60%, mildly dil LA. Placed on Xarelto/daily Flecainide. c. 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  . Noncompliance   . History of prolonged Q-T interval on ECG     a. 09/2012 adm: QTC 506.   Past Surgical History  Procedure Laterality Date  . Ventral hernia repair  02/05/05    with mesh  . Cesarean section    . Hernia repair     Family History  Problem Relation Age of Onset  . Other Father     killed in [place] civil war when he was in his 4250's  . Asthma Mother     alive in her 7560's  . Other Sister     A & W  . Other Sister     A &  W  . Heart disease Neg Hx    History  Substance Use Topics  . Smoking status: Never Smoker   . Smokeless tobacco: Never Used  . Alcohol Use: No   OB History    No data available     Review of Systems  ROS reviewed and all otherwise negative except for mentioned in HPI    Allergies  Review of patient's allergies indicates no known allergies.  Home Medications   Prior to Admission medications   Medication Sig Start Date End Date Taking? Authorizing Provider  aspirin 81 MG tablet Take 81 mg by mouth daily.   Yes Historical Provider, MD  flecainide (TAMBOCOR) 50 MG tablet TAKE 1 TABLET (50 MG TOTAL) BY MOUTH 2 (TWO) TIMES DAILY. 01/18/14  Yes Wendall StadePeter C Nishan, MD  LANTUS SOLOSTAR 100 UNIT/ML Solostar Pen Inject 60 Units into the skin at bedtime. 03/21/14  Yes Historical Provider, MD  lisinopril (PRINIVIL,ZESTRIL) 5 MG tablet Take 1 tablet (5 mg total) by mouth daily. 12/30/12  Yes Lonia BloodJeffrey T McClung, MD  metFORMIN (GLUCOPHAGE) 500 MG tablet Take 1 tablet (500 mg total) by mouth 2 (two) times daily with a meal. 12/30/12  Yes Lonia BloodJeffrey T McClung,  MD   BP 150/88 mmHg  Pulse 75  Temp(Src) 98.7 F (37.1 C) (Oral)  Resp 19  Ht  (1.676 m)  SpO2 98%  Vitals reviewed Physical Exam  Physical Examination: General appearance - alert, well appearing, and in no distress Mental status - alert, oriented to person, place, and time Eyes - no conjunctival injection no scleral icterus Mouth - mucous membranes moist, pharynx normal without lesions Chest - clear to auscultation, no wheezes, rales or rhonchi, symmetric air entry Heart - normal rate, regular rhythm, normal S1, S2, no murmurs, rubs, clicks or gallops Abdomen - soft, nontender, nondistended, no masses or organomegaly Extremities - peripheral pulses normal, no pedal edema, no clubbing or cyanosis Skin - normal coloration and turgor, no rashes  ED Course  Procedures (including critical care time) Labs Review Labs Reviewed  BASIC  METABOLIC PANEL - Abnormal; Notable for the following:    Glucose, Bld 176 (*)    All other components within normal limits  CBC - Abnormal; Notable for the following:    Hemoglobin 11.1 (*)    HCT 34.1 (*)    MCH 25.5 (*)    All other components within normal limits  BRAIN NATRIURETIC PEPTIDE  I-STAT TROPOININ, ED  Rosezena Sensor, ED    Imaging Review Dg Chest 2 View  06/07/2014   CLINICAL DATA:  Chest pain and shortness of Breath  EXAM: CHEST - 2 VIEW  COMPARISON:  02/15/2014  FINDINGS: The heart size and mediastinal contours are within normal limits. Both lungs are clear. The visualized skeletal structures are unremarkable.  IMPRESSION: No active disease.   Electronically Signed   By: Alcide Clever M.D.   On: 06/07/2014 14:07     EKG Interpretation   Date/Time:  Thursday June 07 2014 13:16:00 EDT Ventricular Rate:  85 PR Interval:  182 QRS Duration: 78 QT Interval:  388 QTC Calculation: 461 R Axis:   62 Text Interpretation:  Normal sinus rhythm Normal ECG No significant change  since last tracing Confirmed by [facility]  MD, MARTHA [phone]) on 06/07/2014  2:27:31 PM      MDM   Final diagnoses:  Chest pain, unspecified chest pain type    Pt presenting with c/o chest pain- she has had similar chest pain over the past several months and states it continues to occur.  She had normal stress myovue in 3/16 for same chest pain.  She does have risk factors including htn, dm.  She has afib- is not taking her xarelto but states she now has insurance and i strongly encouraged her to get the xarelto filled and continue taking this.    ekg in sinus rhythm and normal today.  Troponin negative- if second set is negative, pt should f/u with her cardiologist and PMD.  Discharged with strict return precautions.  Pt agreeable with plan.    Jerelyn Scott, MD 06/08/14 [phone]

## 2014-06-07 NOTE — Discharge Instructions (Signed)

## 2014-11-01 ENCOUNTER — Encounter (HOSPITAL_COMMUNITY): Payer: Self-pay | Admitting: *Deleted

## 2014-11-01 ENCOUNTER — Emergency Department (HOSPITAL_COMMUNITY): Payer: No Typology Code available for payment source

## 2014-11-01 ENCOUNTER — Emergency Department (HOSPITAL_COMMUNITY)
Admission: EM | Admit: 2014-11-01 | Discharge: 2014-11-01 | Disposition: A | Discharge status: Home / Self Care | Payer: No Typology Code available for payment source | Attending: Emergency Medicine | Admitting: Emergency Medicine

## 2014-11-01 DIAGNOSIS — I480 Paroxysmal atrial fibrillation: Secondary | ICD-10-CM | POA: Insufficient documentation

## 2014-11-01 DIAGNOSIS — R079 Chest pain, unspecified: Secondary | ICD-10-CM | POA: Insufficient documentation

## 2014-11-01 DIAGNOSIS — R739 Hyperglycemia, unspecified: Secondary | ICD-10-CM

## 2014-11-01 DIAGNOSIS — Z8632 Personal history of gestational diabetes: Secondary | ICD-10-CM | POA: Insufficient documentation

## 2014-11-01 DIAGNOSIS — Z8719 Personal history of other diseases of the digestive system: Secondary | ICD-10-CM | POA: Insufficient documentation

## 2014-11-01 DIAGNOSIS — Z7982 Long term (current) use of aspirin: Secondary | ICD-10-CM | POA: Insufficient documentation

## 2014-11-01 DIAGNOSIS — Z794 Long term (current) use of insulin: Secondary | ICD-10-CM | POA: Insufficient documentation

## 2014-11-01 DIAGNOSIS — E1165 Type 2 diabetes mellitus with hyperglycemia: Secondary | ICD-10-CM | POA: Insufficient documentation

## 2014-11-01 DIAGNOSIS — Z9119 Patient's noncompliance with other medical treatment and regimen: Secondary | ICD-10-CM | POA: Insufficient documentation

## 2014-11-01 DIAGNOSIS — Z79899 Other long term (current) drug therapy: Secondary | ICD-10-CM | POA: Insufficient documentation

## 2014-11-01 DIAGNOSIS — I10 Essential (primary) hypertension: Secondary | ICD-10-CM | POA: Insufficient documentation

## 2014-11-01 LAB — BASIC METABOLIC PANEL
ANION GAP: 13 (ref 5–15)
BUN: 10 mg/dL (ref 6–20)
CALCIUM: 9.6 mg/dL (ref 8.9–10.3)
CO2: 25 mmol/L (ref 22–32)
CREATININE: 0.89 mg/dL (ref 0.44–1.00)
Chloride: 101 mmol/L (ref 101–111)
Glucose, Bld: 340 mg/dL — ABNORMAL HIGH (ref 65–99)
Potassium: 3.8 mmol/L (ref 3.5–5.1)
SODIUM: 139 mmol/L (ref 135–145)

## 2014-11-01 LAB — I-STAT TROPONIN, ED
TROPONIN I, POC: 0 ng/mL (ref 0.00–0.08)
Troponin i, poc: 0 ng/mL (ref 0.00–0.08)

## 2014-11-01 LAB — CBC
HCT: 36.4 % (ref 36.0–46.0)
HEMOGLOBIN: 12 g/dL (ref 12.0–15.0)
MCH: 25.7 pg — ABNORMAL LOW (ref 26.0–34.0)
MCHC: 33 g/dL (ref 30.0–36.0)
MCV: 77.9 fL — ABNORMAL LOW (ref 78.0–100.0)
PLATELETS: 277 10*3/uL (ref 150–400)
RBC: 4.67 MIL/uL (ref 3.87–5.11)
RDW: 14 % (ref 11.5–15.5)
WBC: 7.8 10*3/uL (ref 4.0–10.5)

## 2014-11-01 MED ORDER — KETOROLAC TROMETHAMINE 30 MG/ML IJ SOLN
30.0000 mg | Freq: Once | INTRAMUSCULAR | Status: AC
  Administered 2014-11-01: 30 mg via INTRAVENOUS
  Filled 2014-11-01: qty 1

## 2014-11-01 NOTE — ED Provider Notes (Signed)
CSN: [id number]     Arrival date & time 11/01/14  1115 History   First MD Initiated Contact with Patient 11/01/14 1134     Chief Complaint  Patient presents with  . Chest Pain     (Consider location/radiation/quality/duration/timing/severity/associated sxs/prior Treatment) HPI [name] is a 44 y.o. female with history of hypertension, diabetes, paroxysmal A. fib, presents to emergency department complaining of left-sided chest pain. Pain started at rest yesterday. States it subsided after a few hours, but continued to have it through the night and this morning and that is why she came to emergency department. Patient states that pain is constant. Radiates into the left arm and to the back. Stated says is worse with deep breathing. She denies however any shortness of breath. No pain or swelling in extremities. Reports similar pain in the past over last several years. Reports from several visits to emergency department and she has seen cardiologist several times with negative Myoview in March of this year. She denies recent illnesses. She denies any cough or congestion. She is not taking any medications for this.  Past Medical History  Diagnosis Date  . Diabetes mellitus     uncontrolled  . Hx gestational diabetes   . Small bowel obstruction (HCC) 09/2010    resolved  . Atrial fibrillation with rapid ventricular response (HCC)     PAF new onset  . Diabetes mellitus     a. 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 b. 05/2012 A1c 12.6.  . Hypertension   . PAF (paroxysmal atrial fibrillation) (HCC)     a. 07/2011 ED visit with AF-> converted with flecainide, never followed-up;  b. 05/2012 Recurrent Afib -echo: EF 65-60%, mildly dil LA. Placed on Xarelto/daily Flecainide. c. 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  . Noncompliance   . History of prolonged Q-T interval on ECG     a. 09/2012 adm: QTC 506.   Past Surgical History  Procedure Laterality Date  .  Ventral hernia repair  02/05/05    with mesh  . Cesarean section    . Hernia repair     Family History  Problem Relation Age of Onset  . Other Father     killed in [place] civil war when he was in his 89's  . Asthma Mother     alive in her 53's  . Other Sister     A & W  . Other Sister     A & W  . Heart disease Neg Hx    Social History  Substance Use Topics  . Smoking status: Never Smoker   . Smokeless tobacco: Never Used  . Alcohol Use: No   OB History    No data available     Review of Systems  Constitutional: Negative for fever and chills.  Respiratory: Positive for chest tightness. Negative for cough and shortness of breath.   Cardiovascular: Positive for chest pain. Negative for palpitations and leg swelling.  Gastrointestinal: Negative for nausea, vomiting, abdominal pain and diarrhea.  Genitourinary: Negative for dysuria, flank pain and pelvic pain.  Musculoskeletal: Negative for myalgias, arthralgias, neck pain and neck stiffness.  Skin: Negative for rash.  Neurological: Negative for dizziness, weakness and headaches.  All other systems reviewed and are negative.     Allergies  Review of patient's allergies indicates no known allergies.  Home Medications   Prior to Admission medications   Medication Sig Start Date End Date Taking? Authorizing Provider  aspirin 81 MG tablet Take 81 mg by mouth daily.    Historical Provider, MD  flecainide (TAMBOCOR) 50 MG tablet TAKE 1 TABLET (50 MG TOTAL) BY MOUTH 2 (TWO) TIMES DAILY. 01/18/14   Wendall Stade, MD  LANTUS SOLOSTAR 100 UNIT/ML Solostar Pen Inject 60 Units into the skin at bedtime. 03/21/14   Historical Provider, MD  lisinopril (PRINIVIL,ZESTRIL) 5 MG tablet Take 1 tablet (5 mg total) by mouth daily. 12/30/12   Lonia Blood, MD  metFORMIN (GLUCOPHAGE) 500 MG tablet Take 1 tablet (500 mg total) by mouth 2 (two) times daily with a meal. 12/30/12   Lonia Blood, MD   BP 183/93 mmHg  Pulse 78   Temp(Src) 98.3 F (36.8 C) (Oral)  Resp 18  SpO2 100% Physical Exam  Constitutional: She is oriented to person, place, and time. She appears well-developed and well-nourished. No distress.  HENT:  Head: Normocephalic.  Eyes: Conjunctivae are normal.  Neck: Neck supple.  Cardiovascular: Normal rate, regular rhythm and normal heart sounds.   Pulmonary/Chest: Effort normal and breath sounds normal. No respiratory distress. She has no wheezes. She has no rales.  Abdominal: Soft. Bowel sounds are normal. She exhibits no distension. There is no tenderness. There is no rebound.  Musculoskeletal: She exhibits no edema.  Neurological: She is alert and oriented to person, place, and time.  Skin: Skin is warm and dry.  Psychiatric: She has a normal mood and affect. Her behavior is normal.  Nursing note and vitals reviewed.   ED Course  Procedures (including critical care time) Labs Review Labs Reviewed  BASIC METABOLIC PANEL - Abnormal; Notable for the following:    Glucose, Bld 340 (*)    All other components within normal limits  CBC - Abnormal; Notable for the following:    MCV 77.9 (*)    MCH 25.7 (*)    All other components within normal limits  I-STAT TROPOININ, ED  Rosezena Sensor, ED    Imaging Review Dg Chest 2 View  11/01/2014  CLINICAL DATA:  Left-sided chest pain since last night EXAM: CHEST  2 VIEW COMPARISON:  06/07/2014 FINDINGS: Normal heart size and mediastinal contours. No acute infiltrate or edema. No effusion or pneumothorax. No acute osseous findings. IMPRESSION: No active cardiopulmonary disease. Electronically Signed   By: Marnee Spring M.D.   On: 11/01/2014 12:23   I have personally reviewed and evaluated these images and lab results as part of my medical decision-making.   EKG Interpretation   Date/Time:  Thursday November 01 2014 13:10:08 EDT Ventricular Rate:  76 PR Interval:  196 QRS Duration: 73 QT Interval:  400 QTC Calculation: 450 R Axis:    69 Text Interpretation:  Sinus rhythm Baseline wander in lead(s) II III aVF  V6 Confirmed by Lincoln Brigham [id number]) on 11/01/2014 3:20:23 PM      MDM   Final diagnoses:  Chest pain, unspecified chest pain type  Hyperglycemia    Patient with chest pain since yesterday. Pain left side, radiates into the back and left arm. Vital signs show hypertension, otherwise normal. EKG unremarkable at this time. Will check labs, chest x-ray.  3:12 PM Heart score of 2. Myoview in 3/16 negative. Multiple visits to ED for the same. Labs unremarkable other than hyperglycemia. Discussed diet, exercise, compliance with meds. Delta trop after 3 hrs negative. Home with PCP follow up.   Filed Vitals:   11/01/14 1345 11/01/14 1400 11/01/14 1415 11/01/14 1445  BP: 166/92 153/91 147/82 166/90  Pulse: 66 71 68 72  Temp:      TempSrc:      Resp: 19 18 16 20   SpO2: 100% 100% 98% 100%     Jaynie Crumbleatyana Hershall Benkert, PA-C 11/01/14 1644  Tilden FossaElizabeth Rees, MD 11/02/14 1254

## 2014-11-01 NOTE — Discharge Instructions (Signed)
Tylenol or Motrin for pain. Follow-up with primary care doctor or cardiology if symptoms continue. Return if worsening symptoms  Nonspecific Chest Pain  Chest pain can be caused by many different conditions. There is always a chance that your pain could be related to something serious, such as a heart attack or a blood clot in your lungs. Chest pain can also be caused by conditions that are not life-threatening. If you have chest pain, it is very important to follow up with your health care provider. CAUSES  Chest pain can be caused by:  Heartburn.  Pneumonia or bronchitis.  Anxiety or stress.  Inflammation around your heart (pericarditis) or lung (pleuritis or pleurisy).  A blood clot in your lung.  A collapsed lung (pneumothorax). It can develop suddenly on its own (spontaneous pneumothorax) or from trauma to the chest.  Shingles infection (varicella-zoster virus).  Heart attack.  Damage to the bones, muscles, and cartilage that make up your chest wall. This can include:  Bruised bones due to injury.  Strained muscles or cartilage due to frequent or repeated coughing or overwork.  Fracture to one or more ribs.  Sore cartilage due to inflammation (costochondritis). RISK FACTORS  Risk factors for chest pain may include:  Activities that increase your risk for trauma or injury to your chest.  Respiratory infections or conditions that cause frequent coughing.  Medical conditions or overeating that can cause heartburn.  Heart disease or family history of heart disease.  Conditions or health behaviors that increase your risk of developing a blood clot.  Having had chicken pox (varicella zoster). SIGNS AND SYMPTOMS Chest pain can feel like:  Burning or tingling on the surface of your chest or deep in your chest.  Crushing, pressure, aching, or squeezing pain.  Dull or sharp pain that is worse when you move, cough, or take a deep breath.  Pain that is also felt in  your back, neck, shoulder, or arm, or pain that spreads to any of these areas. Your chest pain may come and go, or it may stay constant. DIAGNOSIS Lab tests or other studies may be needed to find the cause of your pain. Your health care provider may have you take a test called an ambulatory ECG (electrocardiogram). An ECG records your heartbeat patterns at the time the test is performed. You may also have other tests, such as:  Transthoracic echocardiogram (TTE). During echocardiography, sound waves are used to create a picture of all of the heart structures and to look at how blood flows through your heart.  Transesophageal echocardiogram (TEE).This is a more advanced imaging test that obtains images from inside your body. It allows your health care provider to see your heart in finer detail.  Cardiac monitoring. This allows your health care provider to monitor your heart rate and rhythm in real time.  Holter monitor. This is a portable device that records your heartbeat and can help to diagnose abnormal heartbeats. It allows your health care provider to track your heart activity for several days, if needed.  Stress tests. These can be done through exercise or by taking medicine that makes your heart beat more quickly.  Blood tests.  Imaging tests. TREATMENT  Your treatment depends on what is causing your chest pain. Treatment may include:  Medicines. These may include:  Acid blockers for heartburn.  Anti-inflammatory medicine.  Pain medicine for inflammatory conditions.  Antibiotic medicine, if an infection is present.  Medicines to dissolve blood clots.  Medicines to treat coronary  artery disease.  Supportive care for conditions that do not require medicines. This may include:  Resting.  Applying heat or cold packs to injured areas.  Limiting activities until pain decreases. HOME CARE INSTRUCTIONS  If you were prescribed an antibiotic medicine, finish it all even if  you start to feel better.  Avoid any activities that bring on chest pain.  Do not use any tobacco products, including cigarettes, chewing tobacco, or electronic cigarettes. If you need help quitting, ask your health care provider.  Do not drink alcohol.  Take medicines only as directed by your health care provider.  Keep all follow-up visits as directed by your health care provider. This is important. This includes any further testing if your chest pain does not go away.  If heartburn is the cause for your chest pain, you may be told to keep your head raised (elevated) while sleeping. This reduces the chance that acid will go from your stomach into your esophagus.  Make lifestyle changes as directed by your health care provider. These may include:  Getting regular exercise. Ask your health care provider to suggest some activities that are safe for you.  Eating a heart-healthy diet. A registered dietitian can help you to learn healthy eating options.  Maintaining a healthy weight.  Managing diabetes, if necessary.  Reducing stress. SEEK MEDICAL CARE IF:  Your chest pain does not go away after treatment.  You have a rash with blisters on your chest.  You have a fever. SEEK IMMEDIATE MEDICAL CARE IF:   Your chest pain is worse.  You have an increasing cough, or you cough up blood.  You have severe abdominal pain.  You have severe weakness.  You faint.  You have chills.  You have sudden, unexplained chest discomfort.  You have sudden, unexplained discomfort in your arms, back, neck, or jaw.  You have shortness of breath at any time.  You suddenly start to sweat, or your skin gets clammy.  You feel nauseous or you vomit.  You suddenly feel light-headed or dizzy.  Your heart begins to beat quickly, or it feels like it is skipping beats. These symptoms may represent a serious problem that is an emergency. Do not wait to see if the symptoms will go away. Get medical  help right away. Call your local emergency services (911 in the [place] Do not drive yourself to the hospital.   This information is not intended to replace advice given to you by your health care provider. Make sure you discuss any questions you have with your health care provider.   Document Released: 10/01/2004 Document Revised: 01/12/2014 Document Reviewed: 07/28/2013 Elsevier Interactive Patient Education [employer].

## 2014-11-01 NOTE — ED Notes (Signed)
Pt started having chest pain last nite while sitting to left side of chest and radiates to back.

## 2014-11-01 NOTE — ED Notes (Signed)
Triage EKG did not cross over into Epic. Repeat EKG completed.

## 2015-02-27 MED FILL — metFORMIN HCL 500 MG TABS: 500 | 30 days supply | Qty: 60 | Fill #0

## 2015-05-10 MED FILL — metFORMIN HCL 500 MG TABS: 500 | 30 days supply | Qty: 60 | Fill #1

## 2015-05-23 ENCOUNTER — Emergency Department (HOSPITAL_COMMUNITY): Payer: No Typology Code available for payment source

## 2015-05-23 ENCOUNTER — Emergency Department (HOSPITAL_COMMUNITY)
Admission: EM | Admit: 2015-05-23 | Discharge: 2015-05-23 | Disposition: A | Discharge status: Home / Self Care | Payer: No Typology Code available for payment source | Attending: Emergency Medicine | Admitting: Emergency Medicine

## 2015-05-23 ENCOUNTER — Encounter (HOSPITAL_COMMUNITY): Payer: Self-pay | Admitting: Emergency Medicine

## 2015-05-23 DIAGNOSIS — Z7984 Long term (current) use of oral hypoglycemic drugs: Secondary | ICD-10-CM | POA: Insufficient documentation

## 2015-05-23 DIAGNOSIS — Z8632 Personal history of gestational diabetes: Secondary | ICD-10-CM | POA: Diagnosis not present

## 2015-05-23 DIAGNOSIS — Y9289 Other specified places as the place of occurrence of the external cause: Secondary | ICD-10-CM | POA: Diagnosis not present

## 2015-05-23 DIAGNOSIS — Z7982 Long term (current) use of aspirin: Secondary | ICD-10-CM | POA: Insufficient documentation

## 2015-05-23 DIAGNOSIS — M546 Pain in thoracic spine: Secondary | ICD-10-CM | POA: Diagnosis present

## 2015-05-23 DIAGNOSIS — Y9389 Activity, other specified: Secondary | ICD-10-CM | POA: Diagnosis not present

## 2015-05-23 DIAGNOSIS — T148XXA Other injury of unspecified body region, initial encounter: Secondary | ICD-10-CM

## 2015-05-23 DIAGNOSIS — M549 Dorsalgia, unspecified: Secondary | ICD-10-CM

## 2015-05-23 DIAGNOSIS — M79605 Pain in left leg: Secondary | ICD-10-CM | POA: Diagnosis not present

## 2015-05-23 DIAGNOSIS — Z79899 Other long term (current) drug therapy: Secondary | ICD-10-CM | POA: Diagnosis not present

## 2015-05-23 DIAGNOSIS — E119 Type 2 diabetes mellitus without complications: Secondary | ICD-10-CM | POA: Diagnosis not present

## 2015-05-23 DIAGNOSIS — R202 Paresthesia of skin: Secondary | ICD-10-CM | POA: Insufficient documentation

## 2015-05-23 DIAGNOSIS — T148 Other injury of unspecified body region: Secondary | ICD-10-CM | POA: Insufficient documentation

## 2015-05-23 DIAGNOSIS — Z8719 Personal history of other diseases of the digestive system: Secondary | ICD-10-CM | POA: Insufficient documentation

## 2015-05-23 DIAGNOSIS — M79604 Pain in right leg: Secondary | ICD-10-CM | POA: Diagnosis not present

## 2015-05-23 DIAGNOSIS — Z9119 Patient's noncompliance with other medical treatment and regimen: Secondary | ICD-10-CM | POA: Diagnosis not present

## 2015-05-23 DIAGNOSIS — Y998 Other external cause status: Secondary | ICD-10-CM | POA: Diagnosis not present

## 2015-05-23 DIAGNOSIS — I480 Paroxysmal atrial fibrillation: Secondary | ICD-10-CM | POA: Diagnosis not present

## 2015-05-23 DIAGNOSIS — I10 Essential (primary) hypertension: Secondary | ICD-10-CM | POA: Insufficient documentation

## 2015-05-23 DIAGNOSIS — X58XXXA Exposure to other specified factors, initial encounter: Secondary | ICD-10-CM | POA: Insufficient documentation

## 2015-05-23 MED ORDER — IBUPROFEN 200 MG PO TABS
400.0000 mg | ORAL_TABLET | Freq: Once | ORAL | Status: AC
  Administered 2015-05-23: 400 mg via ORAL
  Filled 2015-05-23: qty 2

## 2015-05-23 MED ORDER — METHOCARBAMOL 750 MG PO TABS
750.0000 mg | ORAL_TABLET | Freq: Four times a day (QID) | ORAL | Status: DC
Start: 2015-05-23 — End: 2016-02-26

## 2015-05-23 NOTE — ED Provider Notes (Signed)
MSE was initiated and I personally evaluated the patient and placed orders (if any) at  12:13 PM on May 23, 2015.  [name] is a 45 y.o. female with PMHx of DM, HTN, prolonged QT and PAF on felcainide who presents to the Emergency Department complaining of constant atraumatic mid back pain, right side greater than left, that began about two days ago while at work. Pt states that she has no hx of same. Pt has associated new bilateral LE pain and tingling. Last night pt had episode of palpitations, was unable to sleep due to this and has been sweating profusely in the last 2 days. She reports abdominal pain that began last week and had 4-5 loose stools three days ago which has since improved. Pt has large firm mass in abdomen as well that has continued to grow since her hernia surgery and often causes her pain. She denies bowel or bladder incontinence, numbness or weakness of the lower extremities, CP, SOB. She denies trauma, injury or fall. She is ambulatory without assistance. She reports surgical h/o hernia repair 11 years ago.  Pt is profusely sweating in the ED. The tops of her feet, back and abdomen are covered in sweat. Temp 99. HR 82, irregular and BP 161/97. Back pain is reproducible on exam and is located across thoracic region. R>L. No neuro deficits on exam. Large, periumbilical mass present. Not pulsatile.   Given that pts back pain is new, atraumatic and is associated with new BLE pain, profuse sweating and palpitations, and pts significant pmhx feel that pt may need larger work up. Possible that this is MSK in nature but given risk factors and presentation will move pt to higher acuity area of ER for further work up. EKG ordered.      The patient appears stable so that the remainder of the MSE may be completed by another provider.  Lester KinsmanSamantha Tripp GrenvilleDowless, PA-C 05/23/15 1223  Lorre NickAnthony Allen, MD 05/25/15 1540

## 2015-05-23 NOTE — ED Notes (Signed)
Per PA report, patient told PA that she does not have chronic pain and that her back pain started 3 days ago.  She also had heart fluttering last night with bilateral leg tingling.   Patient has a palpable mass in abdomen.

## 2015-05-23 NOTE — ED Notes (Signed)
Patient states she has chronic back pain with latest flare being 2 days ago.  Patient denies injury.  Patient denies other symptoms.

## 2015-05-23 NOTE — ED Notes (Signed)
Pt placed into a gown. 

## 2015-05-23 NOTE — ED Notes (Signed)
Please Triage pt.  Pt having upper back pain without injury.  Stated no hx of back problems.

## 2015-05-23 NOTE — ED Provider Notes (Signed)
CSN: [id number]     Arrival date & time [id number]/[id number]  1124 History   First MD Initiated Contact with Patient 05/23/15 1153     Chief Complaint  Patient presents with  . Back Pain     (Consider location/radiation/quality/duration/timing/severity/associated sxs/prior Treatment) HPI Comments: Patient here complaining of right-sided muscle skeletal back pain 2 days. Patient has been normal labor and does lots of twisting of her torso. Characterizes pain is sharp and positional as well as pinpoint. Patient states that she occasionally becomes diaphoretic but this is been going on for quite some time and that is no different than what she normally experiences and is not concerned about this. She denies any associated chest pain or chest pressure. Not dyspneic. Denies any leg pain or swelling. Has had some intermittent lower extremity tingling without back pain. No treatment used prior to arrival  Patient is a 45 y.o. female presenting with back pain. The history is provided by the patient.  Back Pain   Past Medical History  Diagnosis Date  . Diabetes mellitus     uncontrolled  . Hx gestational diabetes   . Small bowel obstruction (HCC) 09/2010    resolved  . Atrial fibrillation with rapid ventricular response (HCC)     PAF new onset  . Diabetes mellitus     a. 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 b. 05/2012 A1c 12.6.  . Hypertension   . PAF (paroxysmal atrial fibrillation) (HCC)     a. 07/2011 ED visit with AF-> converted with flecainide, never followed-up;  b. 05/2012 Recurrent Afib -echo: EF 65-60%, mildly dil LA. Placed on Xarelto/daily Flecainide. c. 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  . Noncompliance   . History of prolonged Q-T interval on ECG     a. 09/2012 adm: QTC 506.   Past Surgical History  Procedure Laterality Date  . Ventral hernia repair  02/05/05    with mesh  . Cesarean section    . Hernia repair     Family History  Problem Relation Age  of Onset  . Other Father     killed in [place] war when he was in his 2350's  . Asthma Mother     alive in her 2860's  . Other Sister     A & W  . Other Sister     A & W  . Heart disease Neg Hx    Social History  Substance Use Topics  . Smoking status: Never Smoker   . Smokeless tobacco: Never Used  . Alcohol Use: No   OB History    No data available     Review of Systems  Musculoskeletal: Positive for back pain.  All other systems reviewed and are negative.     Allergies  Review of patient's allergies indicates no known allergies.  Home Medications   Prior to Admission medications   Medication Sig Start Date End Date Taking? Authorizing Provider  aspirin EC 81 MG tablet Take 81 mg by mouth daily.   Yes Historical Provider, MD  metFORMIN (GLUCOPHAGE) 500 MG tablet Take 1 tablet (500 mg total) by mouth 2 (two) times daily with a meal. 12/30/12  Yes Lonia BloodJeffrey T McClung, MD  flecainide (TAMBOCOR) 50 MG tablet TAKE 1 TABLET (50 MG TOTAL) BY MOUTH 2 (TWO) TIMES DAILY. 01/18/14   Wendall StadePeter C Nishan, MD  LANTUS SOLOSTAR 100 UNIT/ML Solostar Pen Inject 55 Units into the skin at bedtime.  03/21/14   Historical  Provider, MD  lisinopril (PRINIVIL,ZESTRIL) 5 MG tablet Take 1 tablet (5 mg total) by mouth daily. 12/30/12   Lonia Blood, MD   BP 158/107 mmHg  Pulse 87  Temp(Src) 99 F (37.2 C) (Oral)  Resp 20  SpO2 99% Physical Exam  Constitutional: She is oriented to person, place, and time. She appears well-developed and well-nourished.  Non-toxic appearance. No distress.  HENT:  Head: Normocephalic and atraumatic.  Eyes: Conjunctivae, EOM and lids are normal. Pupils are equal, round, and reactive to light.  Neck: Normal range of motion. Neck supple. No tracheal deviation present. No thyroid mass present.  Cardiovascular: Normal rate, regular rhythm and normal heart sounds.  Exam reveals no gallop.   No murmur heard. Pulmonary/Chest: Effort normal and breath sounds normal.  No stridor. No respiratory distress. She has no decreased breath sounds. She has no wheezes. She has no rhonchi. She has no rales.  Abdominal: Soft. Normal appearance and bowel sounds are normal. She exhibits no distension. There is no tenderness. There is no rebound and no CVA tenderness.  Musculoskeletal: Normal range of motion. She exhibits no edema or tenderness.       Back:  Neurological: She is alert and oriented to person, place, and time. She has normal strength. No cranial nerve deficit or sensory deficit. GCS eye subscore is 4. GCS verbal subscore is 5. GCS motor subscore is 6.  Skin: Skin is warm and dry. No abrasion and no rash noted.  Psychiatric: She has a normal mood and affect. Her speech is normal and behavior is normal.  Nursing note and vitals reviewed.   ED Course  Procedures (including critical care time) Labs Review Labs Reviewed - No data to display  Imaging Review No results found. I have personally reviewed and evaluated these images and lab results as part of my medical decision-making.   EKG Interpretation   Date/Time:  Thursday May 23 2015 12:28:49 EDT Ventricular Rate:  76 PR Interval:  188 QRS Duration: 84 QT Interval:  395 QTC Calculation: 444 R Axis:   63 Text Interpretation:  Sinus rhythm No significant change since last  tracing Confirmed by Christ Fullenwider  MD, Aayliah Rotenberry ([id number]) on 05/23/2015 12:48:53 PM      MDM   Final diagnoses:  Back pain    Patient given Motrin for her musculoskeletal back pain and feels better. Do not think that this represents a cardiac etiology. Will prescribe muscle relaxants and anti-inflammatories    Lorre Nick, MD 05/23/15 [phone]

## 2015-05-23 NOTE — Discharge Instructions (Signed)
Back Pain, Adult  Back pain is very common in adults.The cause of back pain is rarely dangerous and the pain often gets better over time.The cause of your back pain may not be known. Some common causes of back pain include:   Strain of the muscles or ligaments supporting the spine.   Wear and tear (degeneration) of the spinal disks.   Arthritis.   Direct injury to the back.  For many people, back pain may return. Since back pain is rarely dangerous, most people can learn to manage this condition on their own.  HOME CARE INSTRUCTIONS  Watch your back pain for any changes. The following actions may help to lessen any discomfort you are feeling:   Remain active. It is stressful on your back to sit or stand in one place for long periods of time. Do not sit, drive, or stand in one place for more than 30 minutes at a time. Take short walks on even surfaces as soon as you are able.Try to increase the length of time you walk each day.   Exercise regularly as directed by your health care provider. Exercise helps your back heal faster. It also helps avoid future injury by keeping your muscles strong and flexible.   Do not stay in bed.Resting more than 1-2 days can delay your recovery.   Pay attention to your body when you bend and lift. The most comfortable positions are those that put less stress on your recovering back. Always use proper lifting techniques, including:    Bending your knees.    Keeping the load close to your body.    Avoiding twisting.   Find a comfortable position to sleep. Use a firm mattress and lie on your side with your knees slightly bent. If you lie on your back, put a pillow under your knees.   Avoid feeling anxious or stressed.Stress increases muscle tension and can worsen back pain.It is important to recognize when you are anxious or stressed and learn ways to manage it, such as with exercise.   Take medicines only as directed by your health care provider. Over-the-counter  medicines to reduce pain and inflammation are often the most helpful.Your health care provider may prescribe muscle relaxant drugs.These medicines help dull your pain so you can more quickly return to your normal activities and healthy exercise.   Apply ice to the injured area:    Put ice in a plastic bag.    Place a towel between your skin and the bag.    Leave the ice on for 20 minutes, 2-3 times a day for the first 2-3 days. After that, ice and heat may be alternated to reduce pain and spasms.   Maintain a healthy weight. Excess weight puts extra stress on your back and makes it difficult to maintain good posture.  SEEK MEDICAL CARE IF:   You have pain that is not relieved with rest or medicine.   You have increasing pain going down into the legs or buttocks.   You have pain that does not improve in one week.   You have night pain.   You lose weight.   You have a fever or chills.  SEEK IMMEDIATE MEDICAL CARE IF:    You develop new bowel or bladder control problems.   You have unusual weakness or numbness in your arms or legs.   You develop nausea or vomiting.   You develop abdominal pain.   You feel faint.     This information   is not intended to replace advice given to you by your health care provider. Make sure you discuss any questions you have with your health care provider.     Document Released: 12/22/2004 Document Revised: 01/12/2014 Document Reviewed: 04/25/2013  Elsevier Interactive Patient Education 2016 [employer]  Muscle Strain  A muscle strain is an injury that occurs when a muscle is stretched beyond its normal length. Usually a small number of muscle fibers are torn when this happens. Muscle strain is rated in degrees. First-degree strains have the least amount of muscle fiber tearing and pain. Second-degree and third-degree strains have increasingly more tearing and pain.   Usually, recovery from muscle strain takes 1-2 weeks. Complete healing takes 5-6 weeks.   CAUSES   Muscle  strain happens when a sudden, violent force placed on a muscle stretches it too far. This may occur with lifting, sports, or a fall.   RISK FACTORS  Muscle strain is especially common in athletes.   SIGNS AND SYMPTOMS  At the site of the muscle strain, there may be:   Pain.   Bruising.   Swelling.   Difficulty using the muscle due to pain or lack of normal function.  DIAGNOSIS   Your health care provider will perform a physical exam and ask about your medical history.  TREATMENT   Often, the best treatment for a muscle strain is resting, icing, and applying cold compresses to the injured area.   HOME CARE INSTRUCTIONS    Use the PRICE method of treatment to promote muscle healing during the first 2-3 days after your injury. The PRICE method involves:    Protecting the muscle from being injured again.    Restricting your activity and resting the injured body part.    Icing your injury. To do this, put ice in a plastic bag. Place a towel between your skin and the bag. Then, apply the ice and leave it on from 15-20 minutes each hour. After the third day, switch to moist heat packs.    Apply compression to the injured area with a splint or elastic bandage. Be careful not to wrap it too tightly. This may interfere with blood circulation or increase swelling.    Elevate the injured body part above the level of your heart as often as you can.   Only take over-the-counter or prescription medicines for pain, discomfort, or fever as directed by your health care provider.   Warming up prior to exercise helps to prevent future muscle strains.  SEEK MEDICAL CARE IF:    You have increasing pain or swelling in the injured area.   You have numbness, tingling, or a significant loss of strength in the injured area.  MAKE SURE YOU:    Understand these instructions.   Will watch your condition.   Will get help right away if you are not doing well or get worse.     This information is not intended to replace advice given to  you by your health care provider. Make sure you discuss any questions you have with your health care provider.     Document Released: 12/22/2004 Document Revised: 10/12/2012 Document Reviewed: 07/21/2012  Elsevier Interactive Patient Education 2016 [employer]

## 2015-07-19 MED FILL — metFORMIN HCL 500 MG TABS: 500 | 30 days supply | Qty: 60 | Fill #2

## 2015-11-18 MED FILL — LISINOPRIL-HCTZ 20-12.5 MG: 20-12.5 | 30 days supply | Qty: 30 | Fill #0

## 2015-11-18 MED FILL — metFORMIN HCL 500 MG TABS: 500 | 30 days supply | Qty: 60 | Fill #0

## 2016-01-15 ENCOUNTER — Encounter (HOSPITAL_COMMUNITY): Payer: Self-pay | Admitting: Emergency Medicine

## 2016-01-15 ENCOUNTER — Emergency Department (HOSPITAL_COMMUNITY): Payer: No Typology Code available for payment source

## 2016-01-15 DIAGNOSIS — Z7982 Long term (current) use of aspirin: Secondary | ICD-10-CM | POA: Insufficient documentation

## 2016-01-15 DIAGNOSIS — R10816 Epigastric abdominal tenderness: Secondary | ICD-10-CM | POA: Insufficient documentation

## 2016-01-15 DIAGNOSIS — Z7984 Long term (current) use of oral hypoglycemic drugs: Secondary | ICD-10-CM | POA: Insufficient documentation

## 2016-01-15 DIAGNOSIS — E1165 Type 2 diabetes mellitus with hyperglycemia: Secondary | ICD-10-CM | POA: Insufficient documentation

## 2016-01-15 DIAGNOSIS — R0789 Other chest pain: Secondary | ICD-10-CM | POA: Insufficient documentation

## 2016-01-15 DIAGNOSIS — I10 Essential (primary) hypertension: Secondary | ICD-10-CM | POA: Insufficient documentation

## 2016-01-15 LAB — BASIC METABOLIC PANEL
Anion gap: 12 (ref 5–15)
BUN: 16 mg/dL (ref 6–20)
CALCIUM: 9.7 mg/dL (ref 8.9–10.3)
CO2: 27 mmol/L (ref 22–32)
Chloride: 97 mmol/L — ABNORMAL LOW (ref 101–111)
Creatinine, Ser: 0.89 mg/dL (ref 0.44–1.00)
GFR calc Af Amer: >60 mL/min (ref >60)
GLUCOSE: 314 mg/dL — AB (ref 65–99)
Potassium: 4.1 mmol/L (ref 3.5–5.1)
Sodium: 136 mmol/L (ref 135–145)

## 2016-01-15 LAB — CBC
HEMATOCRIT: 38.8 % (ref 36.0–46.0)
Hemoglobin: 12.8 g/dL (ref 12.0–15.0)
MCH: 26.3 pg (ref 26.0–34.0)
MCHC: 33 g/dL (ref 30.0–36.0)
MCV: 79.8 fL (ref 78.0–100.0)
Platelets: 291 10*3/uL (ref 150–400)
RBC: 4.86 MIL/uL (ref 3.87–5.11)
RDW: 14.2 % (ref 11.5–15.5)
WBC: 10.2 10*3/uL (ref 4.0–10.5)

## 2016-01-15 LAB — I-STAT TROPONIN, ED: TROPONIN I, POC: 0 ng/mL (ref 0.00–0.08)

## 2016-01-15 MED FILL — LISINOPRIL-HCTZ 20-12.5 MG: 20-12.5 | 30 days supply | Qty: 30 | Fill #1

## 2016-01-15 MED FILL — metFORMIN HCL 500 MG TABS: 500 | 30 days supply | Qty: 60 | Fill #1

## 2016-01-15 NOTE — ED Triage Notes (Signed)
Patient reports right chest pain with mild SOB onset this afternoon , denies nausea or diaphoresis . Pt. Stated that she has not taken her antihypertensive medication this week .

## 2016-01-16 ENCOUNTER — Encounter (HOSPITAL_COMMUNITY): Payer: Self-pay | Admitting: Emergency Medicine

## 2016-01-16 ENCOUNTER — Emergency Department (HOSPITAL_COMMUNITY)
Admission: EM | Admit: 2016-01-16 | Discharge: 2016-01-16 | Disposition: A | Discharge status: Home / Self Care | Payer: Self-pay | Attending: Emergency Medicine | Admitting: Emergency Medicine

## 2016-01-16 DIAGNOSIS — R739 Hyperglycemia, unspecified: Secondary | ICD-10-CM

## 2016-01-16 DIAGNOSIS — R0789 Other chest pain: Secondary | ICD-10-CM

## 2016-01-16 LAB — I-STAT TROPONIN, ED: Troponin i, poc: 0 ng/mL (ref 0.00–0.08)

## 2016-01-16 LAB — CBG MONITORING, ED: Glucose-Capillary: 168 mg/dL — ABNORMAL HIGH (ref 65–99)

## 2016-01-16 MED ORDER — SODIUM CHLORIDE 0.9 % IV BOLUS (SEPSIS)
1000.0000 mL | Freq: Once | INTRAVENOUS | Status: AC
  Administered 2016-01-16: 1000 mL via INTRAVENOUS

## 2016-01-16 MED ORDER — INSULIN ASPART 100 UNIT/ML ~~LOC~~ SOLN
10.0000 [IU] | Freq: Once | SUBCUTANEOUS | Status: AC
  Administered 2016-01-16: 10 [IU] via INTRAVENOUS
  Filled 2016-01-16: qty 1

## 2016-01-16 MED ORDER — MORPHINE SULFATE (PF) 4 MG/ML IV SOLN
4.0000 mg | Freq: Once | INTRAVENOUS | Status: AC
  Administered 2016-01-16: 4 mg via INTRAVENOUS
  Filled 2016-01-16: qty 1

## 2016-01-16 NOTE — ED Provider Notes (Signed)
[facility]ENCY DEPT Provider Note   CSN: [id number] Arrival date & time: 01/15/16  1922     History   Chief Complaint Chief Complaint  Patient presents with  . Chest Pain    HPI [name] is a 46 y.o. female.  HPI   46 year old female with history of uncontrolled diabetes, hypertension, paroxysmal A. fib on flecainide presenting with complaints of chest pain. Patient report since 3 PM yesterday she has had intermittent sharp pain in his chest that starts in her right anterior chest and radiates across the chest, usually lasting resolved without any specific treatment. Pain is associated with some generalized fatigue but no associated dizziness, diaphoresis, shortness of breath, or nausea. Nothing seems to make the pain better or worse. She has normal appetite. She reported having some dental pain for the past several days and have been taking Tylenol and aspirin. States the aspirin did help with the chest pain. She is currently without any active chest pain. She denies any strenuous activities or heavy lifting. No prior history of MI, no prior history of PE or DVT. She admits that she has history of high blood pressure and diabetes but haven't been taking her medication appropriately.  Past Medical History:  Diagnosis Date  . Atrial fibrillation with rapid ventricular response (HCC)    PAF new onset  . Diabetes mellitus    uncontrolled  . Diabetes mellitus    a. 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 b. 05/2012 A1c 12.6.  . History of prolonged Q-T interval on ECG    a. 09/2012 adm: QTC 506.  Marland Kitchen Hx gestational diabetes   . Hypertension   . Noncompliance   . PAF (paroxysmal atrial fibrillation) (HCC)    a. 07/2011 ED visit with AF-> converted with flecainide, never followed-up;  b. 05/2012 Recurrent Afib -echo: EF 65-60%, mildly dil LA. Placed on Xarelto/daily Flecainide. c. 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  . Small bowel obstruction 09/2010   resolved    Patient Active Problem List   Diagnosis Date Noted  . Murmur 03/07/2014  . Chest pain 12/13/2013  . Noncompliance 09/30/2012  . Prolonged Q-T interval on ECG 09/30/2012  . Atrial fibrillation with RVR (HCC) 05/23/2012  . HTN (hypertension) 05/23/2012  . Diabetes mellitus (HCC) 05/23/2012    Past Surgical History:  Procedure Laterality Date  . CESAREAN SECTION    . HERNIA REPAIR    . VENTRAL HERNIA REPAIR  02/05/05   with mesh    OB History    No data available       Home Medications    Prior to Admission medications   Medication Sig Start Date End Date Taking? Authorizing Provider  aspirin EC 81 MG tablet Take 81 mg by mouth daily.    Historical Provider, MD  flecainide (TAMBOCOR) 50 MG tablet TAKE 1 TABLET (50 MG TOTAL) BY MOUTH 2 (TWO) TIMES DAILY. 01/18/14   Wendall Stade, MD  LANTUS SOLOSTAR 100 UNIT/ML Solostar Pen Inject 55 Units into the skin at bedtime.  03/21/14   Historical Provider, MD  lisinopril (PRINIVIL,ZESTRIL) 5 MG tablet Take 1 tablet (5 mg total) by mouth daily. 12/30/12   Lonia Blood, MD  metFORMIN (GLUCOPHAGE) 500 MG tablet Take 1 tablet (500 mg total) by mouth 2 (two) times daily with a meal. 12/30/12   Lonia Blood, MD  methocarbamol (ROBAXIN-750) 750 MG tablet Take 1 tablet (750 mg total) by mouth 4 (four) times daily. 05/23/15  Lorre NickAnthony Allen, MD    Family History Family History  Problem Relation Age of Onset  . Other Father     killed in [place] civil war when he was in his 6850's  . Asthma Mother     alive in her 7160's  . Other Sister     A & W  . Other Sister     A & W  . Heart disease Neg Hx     Social History Social History  Substance Use Topics  . Smoking status: Never Smoker  . Smokeless tobacco: Never Used  . Alcohol use No     Allergies   Patient has no known allergies.   Review of Systems Review of Systems  All other systems reviewed and are negative.    Physical Exam Updated Vital Signs BP  171/98 (BP Location: Left Arm)   Pulse 92   Temp 98.2 F (36.8 C) (Oral)   Resp 16   Ht 5\' 6"  (1.676 m)   Wt 76.2 kg   SpO2 99%   BMI 27.12 kg/m   Physical Exam  Constitutional: She appears well-developed and well-nourished. No distress.  HENT:  Head: Atraumatic.  Eyes: Conjunctivae are normal.  Neck: Neck supple. No JVD present.  Cardiovascular: Normal rate and regular rhythm.   Pulmonary/Chest: Effort normal and breath sounds normal. No respiratory distress. She has no wheezes. She has no rales. She exhibits tenderness (Mild anterior chest wall tenderness on palpation without crepitus or emphysema. No overlying skin changes.).  Abdominal: Soft. She exhibits no distension. There is tenderness (Mild epigastric tenderness. Negative Murphy sign, no pain at McBurney's point.).  Musculoskeletal: She exhibits no edema.  Neurological: She is alert.  Skin: No rash noted.  Psychiatric: She has a normal mood and affect.  Nursing note and vitals reviewed.    ED Treatments / Results  Labs (all labs ordered are listed, but only abnormal results are displayed) Labs Reviewed  BASIC METABOLIC PANEL - Abnormal; Notable for the following:       Result Value   Chloride 97 (*)    Glucose, Bld 314 (*)    All other components within normal limits  CBG MONITORING, ED - Abnormal; Notable for the following:    Glucose-Capillary 168 (*)    All other components within normal limits  CBC  I-STAT TROPOININ, ED  I-STAT TROPOININ, ED    EKG  EKG Interpretation None     ED ECG REPORT   Date: 01/16/2016  Rate: 73  Rhythm: normal sinus rhythm  QRS Axis: normal  Intervals: normal  ST/T Wave abnormalities: normal  Conduction Disutrbances:none  Narrative Interpretation:   Old EKG Reviewed: unchanged  I have personally reviewed the EKG tracing and agree with the computerized printout as noted.   Radiology Dg Chest 2 View  Result Date: 01/15/2016 CLINICAL DATA:  Right side chest pain  beginning today. EXAM: CHEST  2 VIEW COMPARISON:  PA and lateral chest 05/23/2015 and 11/01/2014. FINDINGS: Lungs are clear. Heart size is normal. No pneumothorax or pleural effusion. No acute bony abnormality. IMPRESSION: No acute disease. Electronically Signed   By: Drusilla Kannerhomas  Dalessio M.D.   On: 01/15/2016 20:26    Procedures Procedures (including critical care time)  Medications Ordered in ED Medications  morphine 4 MG/ML injection 4 mg (4 mg Intravenous Given 01/16/16 0114)  sodium chloride 0.9 % bolus 1,000 mL (0 mLs Intravenous Stopped 01/16/16 0159)  insulin aspart (novoLOG) injection 10 Units (10 Units Intravenous Given 01/16/16 0117)  Initial Impression / Assessment and Plan / ED Course  I have reviewed the triage vital signs and the nursing notes.  Pertinent labs & imaging results that were available during my care of the patient were reviewed by me and considered in my medical decision making (see chart for details).  Clinical Course     BP 135/82   Pulse 73   Temp 98.2 F (36.8 C) (Oral)   Resp 19   Ht 5\' 6"  (1.676 m)   Wt 76.2 kg   SpO2 100%   BMI 27.12 kg/m    Final Clinical Impressions(s) / ED Diagnoses   Final diagnoses:  Atypical chest pain  Hyperglycemia    New Prescriptions New Prescriptions   No medications on file   1:16 AM Patient here with reproducible chest pain atypical for ACS. No significant risk factor for PE. She does have history of hypertension diabetes, and haven't been compliant with her medication.  Patient CBG is 314 with normal anion gap. Normal troponin, chest x-ray unremarkable.  3:43 AM Patient was given 10 units of insulin as well as IV fluid which drastically improved her CBG to 168. The troponin unremarkable. Blood pressure improves with blood pressure medication. At this time I have not identified any acute emergent medical condition. I encouraged patient to follow-up with her primary care provider for further management of  her condition. Encouraged patient to be compliant with her medication. Return precaution discussed.   Fayrene Helper, PA-C 01/16/16 1610    Tomasita Crumble, MD 01/16/16 [phone]

## 2016-01-16 NOTE — Discharge Instructions (Signed)
Please take your medications as prescribed and follow up closely with your primary care provider for further management of your health.

## 2016-02-26 ENCOUNTER — Ambulatory Visit (HOSPITAL_COMMUNITY)
Admission: EM | Admit: 2016-02-26 | Discharge: 2016-02-26 | Disposition: A | Discharge status: Home / Self Care | Payer: No Typology Code available for payment source | Attending: Family Medicine | Admitting: Family Medicine

## 2016-02-26 ENCOUNTER — Encounter (HOSPITAL_COMMUNITY): Payer: Self-pay | Admitting: Emergency Medicine

## 2016-02-26 DIAGNOSIS — R0982 Postnasal drip: Secondary | ICD-10-CM

## 2016-02-26 DIAGNOSIS — J069 Acute upper respiratory infection, unspecified: Secondary | ICD-10-CM | POA: Diagnosis not present

## 2016-02-26 MED ORDER — IPRATROPIUM BROMIDE 0.06 % NA SOLN: 2.0000 | Freq: Four times a day (QID) | NASAL | 12 refills | Status: DC

## 2016-02-26 NOTE — ED Triage Notes (Signed)
[facility] a complaint of a cough and nasal drainage x 4 days.

## 2016-02-26 NOTE — ED Provider Notes (Signed)
CSN: [id number]     Arrival date & time 02/26/16  1704 History   First MD Initiated Contact with Patient 02/26/16 1816     Chief Complaint  Patient presents with  . Cough   (Consider location/radiation/quality/duration/timing/severity/associated sxs/prior Treatment) 46 year old female complaining of coughing, sneezing, sniffles runny nose. This started 3 days ago. She is not taking any medications. She has a history of PAF, diabetes mellitus 2, hypertension.      Past Medical History:  Diagnosis Date  . Atrial fibrillation with rapid ventricular response (HCC)    PAF new onset  . Diabetes mellitus    uncontrolled  . Diabetes mellitus    a. 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 b. 05/2012 A1c 12.6.  . History of prolonged Q-T interval on ECG    a. 09/2012 adm: QTC 506.  Marland Kitchen Hx gestational diabetes   . Hypertension   . Noncompliance   . PAF (paroxysmal atrial fibrillation) (HCC)    a. 07/2011 ED visit with AF-> converted with flecainide, never followed-up;  b. 05/2012 Recurrent Afib -echo: EF 65-60%, mildly dil LA. Placed on Xarelto/daily Flecainide. c. 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  . Small bowel obstruction 09/2010   resolved   Past Surgical History:  Procedure Laterality Date  . CESAREAN SECTION    . HERNIA REPAIR    . VENTRAL HERNIA REPAIR  02/05/05   with mesh   Family History  Problem Relation Age of Onset  . Other Father     killed in [place] civil war when he was in his 49's  . Asthma Mother     alive in her 68's  . Other Sister     A & W  . Other Sister     A & W  . Heart disease Neg Hx    Social History  Substance Use Topics  . Smoking status: Never Smoker  . Smokeless tobacco: Never Used  . Alcohol use No   OB History    No data available     Review of Systems  Constitutional: Positive for activity change. Negative for appetite change, chills, fatigue and fever.  HENT: Positive for congestion, postnasal drip and  rhinorrhea. Negative for facial swelling, sore throat and trouble swallowing.   Eyes: Negative.   Respiratory: Positive for cough. Negative for shortness of breath.   Cardiovascular: Negative.  Negative for chest pain.  Gastrointestinal: Negative.   Musculoskeletal: Negative for neck pain and neck stiffness.  Skin: Negative for pallor and rash.  Neurological: Negative.     Allergies  Patient has no known allergies.  Home Medications   Prior to Admission medications   Medication Sig Start Date End Date Taking? Authorizing Provider  aspirin EC 81 MG tablet Take 81 mg by mouth daily.   Yes Historical Provider, MD  lisinopril (PRINIVIL,ZESTRIL) 5 MG tablet Take 1 tablet (5 mg total) by mouth daily. 12/30/12  Yes Lonia Blood, MD  metFORMIN (GLUCOPHAGE) 500 MG tablet Take 1 tablet (500 mg total) by mouth 2 (two) times daily with a meal. 12/30/12  Yes Lonia Blood, MD  ipratropium (ATROVENT) 0.06 % nasal spray Place 2 sprays into both nostrils 4 (four) times daily. 02/26/16   Hayden Rasmussen, NP   Meds Ordered and Administered this Visit  Medications - No data to display  BP 177/89 (BP Location: Right Arm)   Pulse 78   Temp 99.2 F (37.3 C) (Oral)   Resp 18  SpO2 100%  No data found.   Physical Exam  Constitutional: She is oriented to person, place, and time. She appears well-developed and well-nourished. No distress.  HENT:  Head: Normocephalic and atraumatic.  Mouth/Throat: No oropharyngeal exudate.  Bilateral TMs are normal Oropharynx with minor erythema and scant clear PND. Otherwise normal  Eyes: EOM are normal.  Neck: Normal range of motion. Neck supple.  Cardiovascular: Normal rate, regular rhythm and normal heart sounds.   Pulmonary/Chest: Effort normal and breath sounds normal. No respiratory distress. She has no wheezes. She has no rales.  Musculoskeletal: Normal range of motion. She exhibits no edema.  Lymphadenopathy:    She has no cervical adenopathy.   Neurological: She is alert and oriented to person, place, and time.  Skin: Skin is warm and dry. No rash noted.  Psychiatric: She has a normal mood and affect.  Nursing note and vitals reviewed.   Urgent Care Course     Procedures (including critical care time)  Labs Review Labs Reviewed - No data to display  Imaging Review No results found.   Visual Acuity Review  Right Eye Distance:   Left Eye Distance:   Bilateral Distance:    Right Eye Near:   Left Eye Near:    Bilateral Near:         MDM   1. Acute upper respiratory infection   2. PND (post-nasal drip)    Allegra or Zyrtec daily as needed for drainage and runny nose. For stronger antihistamine may take Chlor-Trimeton 2 to 4 mg every 4 to 6 hours, may cause drowsiness. Saline nasal spray used frequently. Ibuprofen 400 mg every 6 hours as needed for pain, discomfort or fever. Drink plenty of fluids and stay well-hydrated. Meds ordered this encounter  Medications  . ipratropium (ATROVENT) 0.06 % nasal spray    Sig: Place 2 sprays into both nostrils 4 (four) times daily.    Dispense:  15 mL    Refill:  12    Order Specific Question:   Supervising Provider    Answer:   Bradd CanaryKINDL, JAMES D [[id number]]       Hayden Rasmussenavid Kanetra Ho, NP 02/26/16 [phone]

## 2016-02-26 NOTE — Discharge Instructions (Signed)
Allegra or Zyrtec daily as needed for drainage and runny nose. For stronger antihistamine may take Chlor-Trimeton 2 to 4 mg every 4 to 6 hours, may cause drowsiness. Saline nasal spray used frequently. Ibuprofen 400 mg every 6 hours as needed for pain, discomfort or fever. Drink plenty of fluids and stay well-hydrated.

## 2016-03-16 MED FILL — metFORMIN HCL 500 MG TABS: 500 | 30 days supply | Qty: 60 | Fill #2

## 2016-03-16 MED FILL — LISINOPRIL-HCTZ 20-12.5 MG: 20-12.5 | 30 days supply | Qty: 30 | Fill #2

## 2016-04-20 ENCOUNTER — Ambulatory Visit (HOSPITAL_COMMUNITY)
Admission: EM | Admit: 2016-04-20 | Discharge: 2016-04-20 | Disposition: A | Discharge status: Home / Self Care | Payer: BC Managed Care – PPO | Attending: Internal Medicine | Admitting: Internal Medicine

## 2016-04-20 ENCOUNTER — Encounter (HOSPITAL_COMMUNITY): Payer: Self-pay | Admitting: Emergency Medicine

## 2016-04-20 DIAGNOSIS — R0789 Other chest pain: Secondary | ICD-10-CM | POA: Diagnosis not present

## 2016-04-20 MED ORDER — DICLOFENAC SODIUM 75 MG PO TBEC: 75.0000 mg | DELAYED_RELEASE_TABLET | Freq: Two times a day (BID) | ORAL | 0 refills | Status: DC

## 2016-04-20 NOTE — ED Provider Notes (Signed)
CSN: [id number]     Arrival date & time 04/20/16  1722 History   First MD Initiated Contact with Patient 04/20/16 1816     Chief Complaint  Patient presents with  . Chest Pain   (Consider location/radiation/quality/duration/timing/severity/associated sxs/prior Treatment) 46 year old female presents to clinic for evaluation of right sided chest wall pain. Been evaluated several times in the past, and a cardiology workup 1 year ago. Pain today started when she was picking up trash, worse with movement, worse when she raises her arm.   The history is provided by the patient.  Chest Pain  Pain location:  R chest Pain quality: aching and throbbing   Pain radiates to:  Does not radiate Pain severity:  Moderate Onset quality:  Gradual Duration:  2 hours Timing:  Intermittent Progression:  Unchanged Chronicity:  Recurrent Context: breathing, lifting, movement and raising an arm   Context: not trauma   Relieved by:  Aspirin (ibuprofen) Worsened by:  Deep breathing, coughing and movement Associated symptoms: no abdominal pain, no anorexia, no anxiety, no back pain, no cough, no diaphoresis, no dizziness, no fever, no headache, no heartburn, no lower extremity edema, no nausea, no near-syncope, no numbness, no orthopnea, no palpitations, no shortness of breath, no syncope, no vomiting and no weakness     Past Medical History:  Diagnosis Date  . Atrial fibrillation with rapid ventricular response (HCC)    PAF new onset  . Diabetes mellitus    uncontrolled  . Diabetes mellitus    a. 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 b. 05/2012 A1c 12.6.  . History of prolonged Q-T interval on ECG    a. 09/2012 adm: QTC 506.  Marland Kitchen Hx gestational diabetes   . Hypertension   . Noncompliance   . PAF (paroxysmal atrial fibrillation) (HCC)    a. 07/2011 ED visit with AF-> converted with flecainide, never followed-up;  b. 05/2012 Recurrent Afib -echo: EF 65-60%, mildly dil LA. Placed  on Xarelto/daily Flecainide. c. 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  . Small bowel obstruction (HCC) 09/2010   resolved   Past Surgical History:  Procedure Laterality Date  . CESAREAN SECTION    . HERNIA REPAIR    . VENTRAL HERNIA REPAIR  02/05/05   with mesh   Family History  Problem Relation Age of Onset  . Other Father     killed in [place] civil war when he was in his 30's  . Asthma Mother     alive in her 84's  . Other Sister     A & W  . Other Sister     A & W  . Heart disease Neg Hx    Social History  Substance Use Topics  . Smoking status: Never Smoker  . Smokeless tobacco: Never Used  . Alcohol use No   OB History    No data available     Review of Systems  Constitutional: Negative for diaphoresis and fever.  HENT: Negative.   Respiratory: Negative for cough, chest tightness, shortness of breath and wheezing.   Cardiovascular: Positive for chest pain. Negative for palpitations, orthopnea, syncope and near-syncope.  Gastrointestinal: Negative for abdominal pain, anorexia, heartburn, nausea and vomiting.  Genitourinary: Negative.   Musculoskeletal: Negative for back pain, neck pain and neck stiffness.  Skin: Negative.   Neurological: Negative for dizziness, weakness, numbness and headaches.  All other systems reviewed and are negative.   Allergies  Patient has no known allergies.  Home  Medications   Prior to Admission medications   Medication Sig Start Date End Date Taking? Authorizing Provider  aspirin EC 81 MG tablet Take 81 mg by mouth daily.    Historical Provider, MD  diclofenac (VOLTAREN) 75 MG EC tablet Take 1 tablet (75 mg total) by mouth 2 (two) times daily. 04/20/16   Dorena Bodo, NP  ipratropium (ATROVENT) 0.06 % nasal spray Place 2 sprays into both nostrils 4 (four) times daily. 02/26/16   Hayden Rasmussen, NP  lisinopril (PRINIVIL,ZESTRIL) 5 MG tablet Take 1 tablet (5 mg total) by mouth daily. 12/30/12   Lonia Blood, MD  metFORMIN  (GLUCOPHAGE) 500 MG tablet Take 1 tablet (500 mg total) by mouth 2 (two) times daily with a meal. 12/30/12   Lonia Blood, MD   Meds Ordered and Administered this Visit  Medications - No data to display  BP (!) 174/95   Pulse 72   Temp 98.5 F (36.9 C) (Oral)   Resp 16   Ht  (1.676 m)   Wt 170 lb (77.1 kg)   SpO2 99%   BMI 27.44 kg/m  No data found.   Physical Exam  Constitutional: She is oriented to person, place, and time. She appears well-developed and well-nourished. No distress.  HENT:  Head: Normocephalic and atraumatic.  Right Ear: External ear normal.  Left Ear: External ear normal.  Neck: Normal range of motion. No JVD present.  Cardiovascular: Normal rate and regular rhythm.   Pulmonary/Chest: Effort normal and breath sounds normal. She exhibits tenderness.  Musculoskeletal:  Reproducible right side chest wall tenderness with palpation, and and adduction and abduction of her right arm.  Neurological: She is alert and oriented to person, place, and time.  Skin: Skin is warm and dry. Capillary refill takes less than 2 seconds. No rash noted. She is not diaphoretic. No erythema.  Psychiatric: She has a normal mood and affect. Her behavior is normal.  Nursing note and vitals reviewed.   Urgent Care Course     ED EKG Date/Time: 04/20/2016 7:11 PM Performed by: Dorena Bodo Authorized by: Eustace Moore   ECG reviewed by ED Physician in the absence of a cardiologist: no   Previous ECG:    Previous ECG:  Unavailable Interpretation:    Interpretation: normal   Rate:    ECG rate:  71   ECG rate assessment: normal   Rhythm:    Rhythm: sinus rhythm   Ectopy:    Ectopy: none   QRS:    QRS axis:  Normal Conduction:    Conduction: normal   ST segments:    ST segments:  Normal T waves:    T waves: normal   Comments:     PR 178 ms QRS 82 ms QT/QTc 422/458 ms   (including critical care time)  Labs Review Labs Reviewed - No data to  display  Imaging Review No results found.     MDM   1. Chest wall pain     Based on history, and physical exam findings leave this is not of cardiac origin. Treating for musculoskeletal chest wall pain. Him prescription for diclofenac, advised follow-up with primary care for symptoms persist, or go to the ER at any time her symptoms worsen, or to go to the ER anytime she experiences cardiac-type chest pain.     Dorena Bodo, NP 04/20/16 1914

## 2016-04-20 NOTE — Discharge Instructions (Signed)
There were no abnormalities on your EKG, and your chest pain is consistent with musculoskeletal pain. I have prescribed a medicine called diclofenac, take 1 tablet twice a day. Should your pain persist, or fail to resolve, follow up with your primary care doctor. If at any time your pain worsen, if it radiates to another part of your body, or if it occurs with exertion, go to the ER sooner as possible.

## 2016-04-20 NOTE — ED Triage Notes (Signed)
PT reports side sided chest pain that started as she was collecting small bags of trash at work. PT reports she been to the ED several times for nonspecific chest pains. PT saw cardiology last year and was told she looks good. Pain is reproduceable. Pain is worse with deep breaths and movement of right arm. PT reports pain is also present behind right shoulder

## 2016-04-20 NOTE — ED Notes (Signed)
Salem Senate NP sees PT in triage and reports she can be seen in UC

## 2016-05-29 MED FILL — metFORMIN HCL 500 MG TABS: 500 | 30 days supply | Qty: 60 | Fill #3

## 2016-07-17 MED FILL — metFORMIN HCL 500 MG TABS: 500 | 30 days supply | Qty: 60 | Fill #4

## 2016-07-17 MED FILL — LISINOPRIL-HCTZ 20-12.5 MG: 20-12.5 | 30 days supply | Qty: 30 | Fill #3

## 2016-07-28 ENCOUNTER — Emergency Department (HOSPITAL_COMMUNITY): Payer: BC Managed Care – PPO

## 2016-07-28 ENCOUNTER — Encounter (HOSPITAL_COMMUNITY): Payer: Self-pay

## 2016-07-28 DIAGNOSIS — R002 Palpitations: Secondary | ICD-10-CM | POA: Diagnosis not present

## 2016-07-28 DIAGNOSIS — E119 Type 2 diabetes mellitus without complications: Secondary | ICD-10-CM | POA: Insufficient documentation

## 2016-07-28 DIAGNOSIS — Z7984 Long term (current) use of oral hypoglycemic drugs: Secondary | ICD-10-CM | POA: Insufficient documentation

## 2016-07-28 DIAGNOSIS — I10 Essential (primary) hypertension: Secondary | ICD-10-CM | POA: Insufficient documentation

## 2016-07-28 DIAGNOSIS — R000 Tachycardia, unspecified: Secondary | ICD-10-CM | POA: Diagnosis present

## 2016-07-28 LAB — BASIC METABOLIC PANEL
ANION GAP: 13 (ref 5–15)
BUN: 13 mg/dL (ref 6–20)
CALCIUM: 9.3 mg/dL (ref 8.9–10.3)
CO2: 24 mmol/L (ref 22–32)
Chloride: 99 mmol/L — ABNORMAL LOW (ref 101–111)
Creatinine, Ser: 0.86 mg/dL (ref 0.44–1.00)
GLUCOSE: 217 mg/dL — AB (ref 65–99)
POTASSIUM: 3.6 mmol/L (ref 3.5–5.1)
SODIUM: 136 mmol/L (ref 135–145)

## 2016-07-28 LAB — CBC
HEMATOCRIT: 34.5 % — AB (ref 36.0–46.0)
HEMOGLOBIN: 11.2 g/dL — AB (ref 12.0–15.0)
MCH: 25.5 pg — ABNORMAL LOW (ref 26.0–34.0)
MCHC: 32.5 g/dL (ref 30.0–36.0)
MCV: 78.6 fL (ref 78.0–100.0)
Platelets: 294 10*3/uL (ref 150–400)
RBC: 4.39 MIL/uL (ref 3.87–5.11)
RDW: 13.7 % (ref 11.5–15.5)
WBC: 8.6 10*3/uL (ref 4.0–10.5)

## 2016-07-28 LAB — I-STAT TROPONIN, ED: TROPONIN I, POC: 0 ng/mL (ref 0.00–0.08)

## 2016-07-28 NOTE — ED Triage Notes (Signed)
Pt reports sudden onset of rapid heart rate and chest pain and SOB at 1800 while sitting at home. Pt has hx of same. Pt reports it slowed down on its on and pain resolved, bu she wanted to get it checked out. NAD a this ime VSS. Hx of afib rvr

## 2016-07-29 ENCOUNTER — Emergency Department (HOSPITAL_COMMUNITY)
Admission: EM | Admit: 2016-07-29 | Discharge: 2016-07-29 | Disposition: A | Discharge status: Home / Self Care | Payer: BC Managed Care – PPO | Attending: Emergency Medicine | Admitting: Emergency Medicine

## 2016-07-29 DIAGNOSIS — R002 Palpitations: Secondary | ICD-10-CM

## 2016-07-29 NOTE — ED Provider Notes (Signed)
By signing my name below, I, Vista Mink, attest that this documentation has been prepared under the direction and in the presence of Jaida Basurto, Layla Maw, DO. Electronically signed, Vista Mink, ED Scribe. 07/29/16. 2:16 AM.   TIME SEEN: 2:16 AM  CHIEF COMPLAINT: Increased HR  HPI:  HPI Comments: [name] is a 46 y.o. female with history of paroxysmal atrial fibrillation, hypertension who presents to the Emergency Department in concerns for increased heart rate this evening. Pt has a gradual onset of increased heart rate this evening. In route here, pt's symptoms have subsided without treatment and her heart rate was 78 on arrival. She notes that she felt "weak" during this episode but denies any nausea or shortness of breath. No chest pain or chest discomfort. No dizziness or diaphoresis. No near-syncope. Pt notes that she has not taken her Xarelto in 7 months because her doctor had not mentioned it during previous visits, so she stopped taking it. Pt denies any chest pain, shortness of breath, vomiting, cough, diarrhea. No fever. She states she is not sure how fast her heart rate was going or if it was irregular. States symptoms lasted for several minutes and then resolved.  ROS: See HPI Constitutional: no fever  Eyes: no drainage  ENT: no runny nose   Cardiovascular:  no chest pain  Resp: no SOB  GI: no vomiting GU: no dysuria Integumentary: no rash  Allergy: no hives  Musculoskeletal: no leg swelling  Neurological: no slurred speech ROS otherwise negative  PAST MEDICAL HISTORY/PAST SURGICAL HISTORY:  Past Medical History:  Diagnosis Date  . Atrial fibrillation with rapid ventricular response (HCC)    PAF new onset  . Diabetes mellitus    uncontrolled  . Diabetes mellitus    a. 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 b. 05/2012 A1c 12.6.  . History of prolonged Q-T interval on ECG    a. 09/2012 adm: QTC 506.  Marland Kitchen Hx gestational diabetes   .  Hypertension   . Noncompliance   . PAF (paroxysmal atrial fibrillation) (HCC)    a. 07/2011 ED visit with AF-> converted with flecainide, never followed-up;  b. 05/2012 Recurrent Afib -echo: EF 65-60%, mildly dil LA. Placed on Xarelto/daily Flecainide. c. 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  . Small bowel obstruction (HCC) 09/2010   resolved    MEDICATIONS:  Prior to Admission medications   Medication Sig Start Date End Date Taking? Authorizing Provider  aspirin EC 81 MG tablet Take 81 mg by mouth daily.    [provider]  diclofenac (VOLTAREN) 75 MG EC tablet Take 1 tablet (75 mg total) by mouth 2 (two) times daily. 04/20/16   Dorena Bodo, NP  ipratropium (ATROVENT) 0.06 % nasal spray Place 2 sprays into both nostrils 4 (four) times daily. 02/26/16   Hayden Rasmussen, NP  lisinopril (PRINIVIL,ZESTRIL) 5 MG tablet Take 1 tablet (5 mg total) by mouth daily. 12/30/12   Lonia Blood, MD  metFORMIN (GLUCOPHAGE) 500 MG tablet Take 1 tablet (500 mg total) by mouth 2 (two) times daily with a meal. 12/30/12   Lonia Blood, MD    ALLERGIES:  No Known Allergies  SOCIAL HISTORY:  Social History  Substance Use Topics  . Smoking status: Never Smoker  . Smokeless tobacco: Never Used  . Alcohol use No    FAMILY HISTORY: Family History  Problem Relation Age of Onset  . Other Father        killed  in [place] war when he was in his 3750's  . Asthma Mother        alive in her 4560's  . Other Sister        A & W  . Other Sister        A & W  . Heart disease Neg Hx     EXAM: BP (!) 156/84   Pulse 73   Temp 98.9 F (37.2 C)   Resp 18   SpO2 100%  CONSTITUTIONAL: Alert and oriented and responds appropriately to questions. Well-appearing; well-nourished HEAD: Normocephalic EYES: Conjunctivae clear, pupils appear equal, EOMI ENT: normal nose; moist mucous membranes NECK: Supple, no meningismus, no nuchal rigidity, no LAD  CARD: RRR; S1 and S2 appreciated; no  murmurs, no clicks, no rubs, no gallops RESP: Normal chest excursion without splinting or tachypnea; breath sounds clear and equal bilaterally; no wheezes, no rhonchi, no rales, no hypoxia or respiratory distress, speaking full sentences ABD/GI: Normal bowel sounds; non-distended; soft, non-tender, no rebound, no guarding, no peritoneal signs, no hepatosplenomegaly BACK:  The back appears normal and is non-tender to palpation, there is no CVA tenderness EXT: Normal ROM in all joints; non-tender to palpation; no edema; normal capillary refill; no cyanosis, no calf tenderness or swelling    SKIN: Normal color for age and race; warm; no rash NEURO: Moves all extremities equally PSYCH: The patient's mood and manner are appropriate. Grooming and personal hygiene are appropriate.  MEDICAL DECISION MAKING: Patient here with palpitations. Has history of paroxysmal atrial fibrillation but is unclear if she was in atrial fibrillation today. She states this does not happen to her often. She denies any chest pain, shortness of breath, nausea, diaphoresis or dizziness with this episode. Currently asymptomatic. She has a normal sinus rhythm here and hemodynamically stable. EKG shows no ischemic changes. Labs ordered in triage are unremarkable including normal electrolytes, hemoglobin and negative troponin. Chest x-ray on vascular congestion without edema. No infiltrate seen. Recommend close follow-up with her cardiologist. Her CHADS-Vasc is 2 that she states she would like to talk to her primary care physician further to discuss if she should restart Xarelto. She is aware that she is at risk for stroke without being anticoagulated if she were to go back into atrial fibrillation. Discussed return cautions with patient and her daughter. I feel she is safe for discharge at this time without further emergent workup.  Doubt ACS, PE, dissection given patient asymptomatic at this time.  At this time, I do not feel there is  any life-threatening condition present. I have reviewed and discussed all results (EKG, imaging, lab, urine as appropriate) and exam findings with patient/family. I have reviewed nursing notes and appropriate previous records.  I feel the patient is safe to be discharged home without further emergent workup and can continue workup as an outpatient as needed. Discussed usual and customary return precautions. Patient/family verbalize understanding and are comfortable with this plan.  Outpatient follow-up has been provided if needed. All questions have been answered.    EKG Interpretation  Date/Time:  Tuesday July 28 2016 20:52:26 EDT Ventricular Rate:  81 PR Interval:  186 QRS Duration: 70 QT Interval:  400 QTC Calculation: 464 R Axis:   85 Text Interpretation:  Normal sinus rhythm Normal ECG When compared with ECG of 04/20/2016, No significant change was found Confirmed by Dione BoozeGlick, David ([id number]) on 07/28/2016 11:34:00 PM       This chart was scribed in my presence and reviewed by  me personally.     Issac Moure, Layla MawKristen N, DO 07/29/16 [phone]

## 2016-07-29 NOTE — Discharge Instructions (Signed)
Please make an appointment for close follow-up with your cardiologist.

## 2016-08-13 ENCOUNTER — Emergency Department (HOSPITAL_COMMUNITY): Payer: BC Managed Care – PPO

## 2016-08-13 ENCOUNTER — Encounter (HOSPITAL_COMMUNITY): Payer: Self-pay | Admitting: Emergency Medicine

## 2016-08-13 DIAGNOSIS — E119 Type 2 diabetes mellitus without complications: Secondary | ICD-10-CM | POA: Diagnosis not present

## 2016-08-13 DIAGNOSIS — Z7982 Long term (current) use of aspirin: Secondary | ICD-10-CM | POA: Diagnosis not present

## 2016-08-13 DIAGNOSIS — Z7984 Long term (current) use of oral hypoglycemic drugs: Secondary | ICD-10-CM | POA: Diagnosis not present

## 2016-08-13 DIAGNOSIS — I10 Essential (primary) hypertension: Secondary | ICD-10-CM | POA: Diagnosis not present

## 2016-08-13 DIAGNOSIS — R002 Palpitations: Secondary | ICD-10-CM | POA: Diagnosis present

## 2016-08-13 LAB — BASIC METABOLIC PANEL WITH GFR
Anion gap: 12 (ref 5–15)
BUN: 12 mg/dL (ref 6–20)
CO2: 26 mmol/L (ref 22–32)
Calcium: 9.3 mg/dL (ref 8.9–10.3)
Chloride: 98 mmol/L — ABNORMAL LOW (ref 101–111)
Creatinine, Ser: 0.94 mg/dL (ref 0.44–1.00)
GFR calc Af Amer: >60 mL/min
GFR calc non Af Amer: >60 mL/min
Glucose, Bld: 263 mg/dL — ABNORMAL HIGH (ref 65–99)
Potassium: 3.7 mmol/L (ref 3.5–5.1)
Sodium: 136 mmol/L (ref 135–145)

## 2016-08-13 LAB — CBC
HCT: 33.9 % — ABNORMAL LOW (ref 36.0–46.0)
Hemoglobin: 11.1 g/dL — ABNORMAL LOW (ref 12.0–15.0)
MCH: 25.6 pg — ABNORMAL LOW (ref 26.0–34.0)
MCHC: 32.7 g/dL (ref 30.0–36.0)
MCV: 78.1 fL (ref 78.0–100.0)
Platelets: 308 10*3/uL (ref 150–400)
RBC: 4.34 MIL/uL (ref 3.87–5.11)
RDW: 13.3 % (ref 11.5–15.5)
WBC: 9.5 10*3/uL (ref 4.0–10.5)

## 2016-08-13 NOTE — ED Triage Notes (Signed)
Pt states her heart is beating fast, states she noticed it this am. HR-83 in triage. Denies shortness of breath/chest pain.

## 2016-08-14 ENCOUNTER — Emergency Department (HOSPITAL_COMMUNITY)
Admission: EM | Admit: 2016-08-14 | Discharge: 2016-08-14 | Disposition: A | Discharge status: Home / Self Care | Payer: BC Managed Care – PPO | Attending: Emergency Medicine | Admitting: Emergency Medicine

## 2016-08-14 DIAGNOSIS — R002 Palpitations: Secondary | ICD-10-CM

## 2016-08-14 NOTE — ED Provider Notes (Signed)
[facility]-EMERGENCY DEPT Provider Note   CSN: [id number] Arrival date & time: 08/13/16  2253     History   Chief Complaint Chief Complaint  Patient presents with  . Palpitations    HPI [name] is a 46 y.o. female.  HPI  47 y.o. female with a hx of PAF, DM, HTN, presents to the Emergency Department today due to rapid heart rate. Noted hx same. Pt noticed it this morning. Denies CP/SOB/ABD pain. Chart review shows ED visit on 07-29-16 for same. Pt states that she felt palpitations in her chest this morning after drinking her coffee. Dissipated after a few hours. Pt symptoms resolved prior to arriving in ED. Denies diaphoresis. No N/V. No fevers. No cough or URI symptoms. No dizziness or near syncope. Pt notes that she was once on Xarelto for Atrial Fibrillation and has been off for several months. Pt is following up with PCP on Monday to discussed anticoagulation.  No other symptoms noted.   Past Medical History:  Diagnosis Date  . Atrial fibrillation with rapid ventricular response (HCC)    PAF new onset  . Diabetes mellitus    uncontrolled  . Diabetes mellitus    a. 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 b. 05/2012 A1c 12.6.  . History of prolonged Q-T interval on ECG    a. 09/2012 adm: QTC 506.  Marland Kitchen Hx gestational diabetes   . Hypertension   . Noncompliance   . PAF (paroxysmal atrial fibrillation) (HCC)    a. 07/2011 ED visit with AF-> converted with flecainide, never followed-up;  b. 05/2012 Recurrent Afib -echo: EF 65-60%, mildly dil LA. Placed on Xarelto/daily Flecainide. c. 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  . Small bowel obstruction (HCC) 09/2010   resolved    Patient Active Problem List   Diagnosis Date Noted  . Murmur 03/07/2014  . Chest pain 12/13/2013  . Noncompliance 09/30/2012  . Prolonged Q-T interval on ECG 09/30/2012  . Atrial fibrillation with RVR (HCC) 05/23/2012  . HTN (hypertension) 05/23/2012  . Diabetes mellitus (HCC)  05/23/2012    Past Surgical History:  Procedure Laterality Date  . CESAREAN SECTION    . HERNIA REPAIR    . VENTRAL HERNIA REPAIR  02/05/05   with mesh    OB History    No data available       Home Medications    Prior to Admission medications   Medication Sig Start Date End Date Taking? Authorizing Provider  aspirin EC 81 MG tablet Take 81 mg by mouth daily.    [provider]  diclofenac (VOLTAREN) 75 MG EC tablet Take 1 tablet (75 mg total) by mouth 2 (two) times daily. 04/20/16   Dorena Bodo, NP  ipratropium (ATROVENT) 0.06 % nasal spray Place 2 sprays into both nostrils 4 (four) times daily. 02/26/16   Hayden Rasmussen, NP  lisinopril (PRINIVIL,ZESTRIL) 5 MG tablet Take 1 tablet (5 mg total) by mouth daily. 12/30/12   Lonia Blood, MD  metFORMIN (GLUCOPHAGE) 500 MG tablet Take 1 tablet (500 mg total) by mouth 2 (two) times daily with a meal. 12/30/12   Lonia Blood, MD    Family History Family History  Problem Relation Age of Onset  . Other Father        killed in [place] civil war when he was in his 64's  . Asthma Mother        alive in her 6's  . Other Sister  A & W  . Other Sister        A & W  . Heart disease Neg Hx     Social History Social History  Substance Use Topics  . Smoking status: Never Smoker  . Smokeless tobacco: Never Used  . Alcohol use No     Allergies   Patient has no known allergies.   Review of Systems Review of Systems ROS reviewed and all are negative for acute change except as noted in the HPI.  Physical Exam Updated Vital Signs BP (!) 156/86   Pulse 76   Temp 98.2 F (36.8 C) (Oral)   Resp 18   SpO2 100%   Physical Exam  Constitutional: She is oriented to person, place, and time. She appears well-developed and well-nourished. No distress.  HENT:  Head: Normocephalic and atraumatic.  Right Ear: Tympanic membrane, external ear and ear canal normal.  Left Ear: Tympanic membrane, external ear  and ear canal normal.  Nose: Nose normal.  Mouth/Throat: Uvula is midline, oropharynx is clear and moist and mucous membranes are normal. No trismus in the jaw. No oropharyngeal exudate, posterior oropharyngeal erythema or tonsillar abscesses.  Eyes: Pupils are equal, round, and reactive to light. EOM are normal.  Neck: Normal range of motion. Neck supple. No tracheal deviation present.  Cardiovascular: Normal rate, regular rhythm, S1 normal, S2 normal, normal heart sounds, intact distal pulses and normal pulses.   Pulmonary/Chest: Effort normal and breath sounds normal. No respiratory distress. She has no decreased breath sounds. She has no wheezes. She has no rhonchi. She has no rales.  Abdominal: Normal appearance and bowel sounds are normal. There is no tenderness.  Musculoskeletal: Normal range of motion.  Neurological: She is alert and oriented to person, place, and time.  Skin: Skin is warm and dry.  Psychiatric: She has a normal mood and affect. Her speech is normal and behavior is normal. Thought content normal.   ED Treatments / Results  Labs (all labs ordered are listed, but only abnormal results are displayed) Labs Reviewed  BASIC METABOLIC PANEL - Abnormal; Notable for the following:       Result Value   Chloride 98 (*)    Glucose, Bld 263 (*)    All other components within normal limits  CBC - Abnormal; Notable for the following:    Hemoglobin 11.1 (*)    HCT 33.9 (*)    MCH 25.6 (*)    All other components within normal limits    EKG  EKG Interpretation  Date/Time:  Thursday August 13 2016 22:57:17 EDT Ventricular Rate:  78 PR Interval:  184 QRS Duration: 74 QT Interval:  404 QTC Calculation: 460 R Axis:   81 Text Interpretation:  Normal sinus rhythm Normal ECG When compared with ECG of 07/28/2016, No significant change was found Confirmed by Dione Booze ([id number]) on 08/14/2016 3:42:45 AM       Radiology Dg Chest 2 View  Result Date: 08/13/2016 CLINICAL DATA:   Acute onset of sensation of fast heartbeat. Initial encounter. EXAM: CHEST  2 VIEW COMPARISON:  Chest radiograph performed 07/28/2016 FINDINGS: The lungs are well-aerated and clear. There is no evidence of focal opacification, pleural effusion or pneumothorax. The heart is normal in size; the mediastinal contour is within normal limits. No acute osseous abnormalities are seen. IMPRESSION: No acute cardiopulmonary process seen. Electronically Signed   By: Roanna Raider M.D.   On: 08/13/2016 23:31    Procedures Procedures (including critical care time)  Medications Ordered in ED Medications - No data to display   Initial Impression / Assessment and Plan / ED Course  I have reviewed the triage vital signs and the nursing notes.  Pertinent labs & imaging results that were available during my care of the patient were reviewed by me and considered in my medical decision making (see chart for details).  Final Clinical Impressions(s) / ED Diagnoses  {I have reviewed and evaluated the relevant laboratory values. {I have reviewed and evaluated the relevant imaging studies. {I have interpreted the relevant EKG. {I have reviewed the relevant previous healthcare records.  {I obtained HPI from historian. {Patient discussed with supervising physician.  ED Course:  Assessment: Pt is a 46 y.o. female female with a hx of PAF, DM, HTN, presents to the Emergency Department today due to rapid heart rate. Noted hx same. Pt noticed it this morning. Denies CP/SOB/ABD pain. Chart review shows ED visit on 07-29-16 for same. Pt states that she felt palpitations in her chest this morning after drinking her coffee. Dissipated after a few hours. Pt symptoms resolved prior to arriving in ED. Denies diaphoresis. No N/V. No fevers. No cough or URI symptoms. No dizziness or near syncope. Pt notes that she was once on Xarelto for Atrial Fibrillation and has been off for several months. Pt is following up with PCP on Monday to  discussed anticoagulation.  No other symptoms noted On exam, pt in NAD. Nontoxic/nonseptic appearing. VSS. Afebrile. Lungs CTA. Heart RRR. Abdomen nontender soft. EKG NSR. Labs unremrkable. CXR unremarkable. Unclear if pt in Afib today. Possible PVCs with coffee intake? Symptosm resolved shortly thereafter. No active CP/SOB/ABD pain. NO diaphoresis. No N/V. Pt seeing PCP on Monday. If Afib, pt CHADVASc of 2. Pt wishes to discuss with PCP about anticoagulation. Pt aware of stroke risk as dicussed previously in past. Discussed caffeine cessation to see if this aids in palpitations. Plan is to DC home with follow up to PCP. Discussed with attending physician who agrees. At time of discharge, Patient is in no acute distress. Vital Signs are stable. Patient is able to ambulate. Patient able to tolerate PO.   Disposition/Plan:  DC Home Additional Verbal discharge instructions given and discussed with patient.  Pt Instructed to f/u with PCP in the next week for evaluation and treatment of symptoms. Return precautions given Pt acknowledges and agrees with plan  Supervising Physician Azalia Bilisampos, Kevin, MD  Final diagnoses:  Palpitations    New Prescriptions New Prescriptions   No medications on file     Wilber BihariMohr, Elchanan Bob, PA-C 08/14/16 [id number]    Azalia Bilisampos, Kevin, MD 08/14/16 [phone]

## 2016-08-14 NOTE — Discharge Instructions (Signed)
Please read and follow all provided instructions.  Your diagnoses today include:  1. Palpitations     Tests performed today include: Vital signs. See below for your results today.   Medications prescribed:  Take as prescribed   Home care instructions:  Follow any educational materials contained in this packet.  Follow-up instructions: Please follow-up with your primary care provider for further evaluation of symptoms and treatment   Return instructions:  Please return to the Emergency Department if you do not get better, if you get worse, or new symptoms OR  - Fever (temperature greater than 101.30F)  - Bleeding that does not stop with holding pressure to the area    -Severe pain (please note that you may be more sore the day after your accident)  - Chest Pain  - Difficulty breathing  - Severe nausea or vomiting  - Inability to tolerate food and liquids  - Passing out  - Skin becoming red around your wounds  - Change in mental status (confusion or lethargy)  - New numbness or weakness    Please return if you have any other emergent concerns.  Additional Information:  Your vital signs today were: BP (!) 156/86    Pulse 76    Temp 98.2 F (36.8 C) (Oral)    Resp 18    SpO2 100%  If your blood pressure (BP) was elevated above 135/85 this visit, please have this repeated by your doctor within one month. ---------------

## 2016-09-04 MED FILL — LISINOPRIL-HCTZ 20-12.5 MG: 20-12.5 | 30 days supply | Qty: 30 | Fill #4

## 2016-09-04 MED FILL — metFORMIN HCL 500 MG TABS: 500 | 30 days supply | Qty: 60 | Fill #5

## 2016-09-28 MED FILL — LISINOPRIL-HCTZ 20-12.5 MG: 20-12.5 | 30 days supply | Qty: 30 | Fill #5

## 2016-12-01 MED FILL — metFORMIN HCL 500 MG TABS: 500 | 30 days supply | Qty: 60 | Fill #0

## 2016-12-24 ENCOUNTER — Encounter (HOSPITAL_COMMUNITY): Payer: Self-pay | Admitting: Emergency Medicine

## 2016-12-24 ENCOUNTER — Emergency Department (HOSPITAL_COMMUNITY): Payer: BC Managed Care – PPO

## 2016-12-24 ENCOUNTER — Emergency Department (HOSPITAL_COMMUNITY)
Admission: EM | Admit: 2016-12-24 | Discharge: 2016-12-24 | Disposition: A | Discharge status: Home / Self Care | Payer: BC Managed Care – PPO | Attending: Emergency Medicine | Admitting: Emergency Medicine

## 2016-12-24 DIAGNOSIS — Z7982 Long term (current) use of aspirin: Secondary | ICD-10-CM | POA: Insufficient documentation

## 2016-12-24 DIAGNOSIS — Z79899 Other long term (current) drug therapy: Secondary | ICD-10-CM | POA: Insufficient documentation

## 2016-12-24 DIAGNOSIS — R1011 Right upper quadrant pain: Secondary | ICD-10-CM | POA: Diagnosis present

## 2016-12-24 DIAGNOSIS — K819 Cholecystitis, unspecified: Secondary | ICD-10-CM | POA: Diagnosis not present

## 2016-12-24 LAB — COMPREHENSIVE METABOLIC PANEL
ALBUMIN: 3.8 g/dL (ref 3.5–5.0)
ALT: 24 U/L (ref 14–54)
ANION GAP: 9 (ref 5–15)
AST: 24 U/L (ref 15–41)
Alkaline Phosphatase: 106 U/L (ref 38–126)
BILIRUBIN TOTAL: 0.6 mg/dL (ref 0.3–1.2)
BUN: 8 mg/dL (ref 6–20)
CO2: 27 mmol/L (ref 22–32)
Calcium: 9.2 mg/dL (ref 8.9–10.3)
Chloride: 98 mmol/L — ABNORMAL LOW (ref 101–111)
Creatinine, Ser: 0.75 mg/dL (ref 0.44–1.00)
GFR calc Af Amer: >60 mL/min (ref >60)
GLUCOSE: 232 mg/dL — AB (ref 65–99)
POTASSIUM: 4.1 mmol/L (ref 3.5–5.1)
Sodium: 134 mmol/L — ABNORMAL LOW (ref 135–145)
TOTAL PROTEIN: 8 g/dL (ref 6.5–8.1)

## 2016-12-24 LAB — URINALYSIS, ROUTINE W REFLEX MICROSCOPIC
BILIRUBIN URINE: NEGATIVE
HGB URINE DIPSTICK: NEGATIVE
KETONES UR: NEGATIVE mg/dL
NITRITE: NEGATIVE
PROTEIN: NEGATIVE mg/dL
Specific Gravity, Urine: 1.016 (ref 1.005–1.030)
pH: 5 (ref 5.0–8.0)

## 2016-12-24 LAB — CBC
HEMATOCRIT: 36.6 % (ref 36.0–46.0)
HEMOGLOBIN: 12 g/dL (ref 12.0–15.0)
MCH: 25.6 pg — ABNORMAL LOW (ref 26.0–34.0)
MCHC: 32.8 g/dL (ref 30.0–36.0)
MCV: 78 fL (ref 78.0–100.0)
Platelets: 260 10*3/uL (ref 150–400)
RBC: 4.69 MIL/uL (ref 3.87–5.11)
RDW: 13.7 % (ref 11.5–15.5)
WBC: 7.4 10*3/uL (ref 4.0–10.5)

## 2016-12-24 LAB — I-STAT BETA HCG BLOOD, ED (MC, WL, AP ONLY)

## 2016-12-24 LAB — LIPASE, BLOOD: LIPASE: 25 U/L (ref 11–51)

## 2016-12-24 MED ORDER — MORPHINE SULFATE (PF) 4 MG/ML IV SOLN
4.0000 mg | Freq: Once | INTRAVENOUS | Status: AC
  Administered 2016-12-24: 4 mg via INTRAVENOUS
  Filled 2016-12-24: qty 1

## 2016-12-24 MED ORDER — ONDANSETRON 8 MG PO TBDP: 8.0000 mg | ORAL_TABLET | Freq: Three times a day (TID) | ORAL | 0 refills | Status: DC | PRN

## 2016-12-24 MED ORDER — IOPAMIDOL (ISOVUE-300) INJECTION 61%
INTRAVENOUS | Status: AC
  Administered 2016-12-24: 100 mL
  Filled 2016-12-24: qty 100

## 2016-12-24 MED ORDER — ONDANSETRON HCL 4 MG/2ML IJ SOLN
4.0000 mg | Freq: Once | INTRAMUSCULAR | Status: AC
  Administered 2016-12-24: 4 mg via INTRAVENOUS
  Filled 2016-12-24: qty 2

## 2016-12-24 NOTE — ED Provider Notes (Signed)
[facility] EMERGENCY DEPARTMENT Provider Note   CSN: [id number] Arrival date & time: 12/24/16  4098     History   Chief Complaint Chief Complaint  Patient presents with  . Flank Pain    HPI [name] is a 46 y.o. female.  HPI Patient is a 46 year old female who presents to the emergency department complaints of intermittent right upper quadrant and right-sided flank pain.  Denies nausea vomiting diarrhea..  No radiation of her pain towards her groin.  Denies dysuria or urinary frequency.  No chest pain or shortness of breath.  No recent cough.  No recent injury or trauma.  Patient is never had pain like this before.  Pain is mild in severity and at times is more severe  Past Medical History:  Diagnosis Date  . Atrial fibrillation with rapid ventricular response (HCC)    PAF new onset  . Diabetes mellitus    uncontrolled  . Diabetes mellitus    a. 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 b. 05/2012 A1c 12.6.  . History of prolonged Q-T interval on ECG    a. 09/2012 adm: QTC 506.  Marland Kitchen Hx gestational diabetes   . Hypertension   . Noncompliance   . PAF (paroxysmal atrial fibrillation) (HCC)    a. 07/2011 ED visit with AF-> converted with flecainide, never followed-up;  b. 05/2012 Recurrent Afib -echo: EF 65-60%, mildly dil LA. Placed on Xarelto/daily Flecainide. c. 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  . Small bowel obstruction (HCC) 09/2010   resolved    Patient Active Problem List   Diagnosis Date Noted  . Murmur 03/07/2014  . Chest pain 12/13/2013  . Noncompliance 09/30/2012  . Prolonged Q-T interval on ECG 09/30/2012  . Atrial fibrillation with RVR (HCC) 05/23/2012  . HTN (hypertension) 05/23/2012  . Diabetes mellitus (HCC) 05/23/2012    Past Surgical History:  Procedure Laterality Date  . CESAREAN SECTION    . HERNIA REPAIR    . VENTRAL HERNIA REPAIR  02/05/05   with mesh    OB History    No data available       Home Medications    Prior to Admission medications   Medication Sig Start Date End Date Taking? Authorizing Provider  aspirin EC 81 MG tablet Take 81 mg by mouth daily.   Yes [provider]  ibuprofen (ADVIL,MOTRIN) 400 MG tablet Take 400 mg by mouth every 6 (six) hours as needed for moderate pain.   Yes [provider]  Liniments (VICKS BABYRUB EX) Apply 1 application topically as needed (back pain).   Yes [provider]  lisinopril-hydrochlorothiazide (PRINZIDE,ZESTORETIC) 20-12.5 MG tablet Take 1 tablet by mouth daily. 09/28/16  Yes [provider]  metFORMIN (GLUCOPHAGE) 500 MG tablet Take 1 tablet (500 mg total) by mouth 2 (two) times daily with a meal. 12/30/12  Yes Lonia Blood, MD  diclofenac (VOLTAREN) 75 MG EC tablet Take 1 tablet (75 mg total) by mouth 2 (two) times daily. Patient not taking: Reported on 08/14/2016 04/20/16   Dorena Bodo, NP  ipratropium (ATROVENT) 0.06 % nasal spray Place 2 sprays into both nostrils 4 (four) times daily. Patient not taking: Reported on 08/14/2016 02/26/16   Hayden Rasmussen, NP  lisinopril (PRINIVIL,ZESTRIL) 5 MG tablet Take 1 tablet (5 mg total) by mouth daily. Patient not taking: Reported on 12/24/2016 12/30/12   Lonia Blood, MD  ondansetron (ZOFRAN ODT) 8 MG disintegrating tablet Take 1 tablet (  8 mg total) by mouth every 8 (eight) hours as needed for nausea or vomiting. 12/24/16   Azalia Bilis, MD    Family History Family History  Problem Relation Age of Onset  . Other Father        killed in [place] civil war when he was in his 75's  . Asthma Mother        alive in her 65's  . Other Sister        A & W  . Other Sister        A & W  . Heart disease Neg Hx     Social History Social History   Tobacco Use  . Smoking status: Never Smoker  . Smokeless tobacco: Never Used  Substance Use Topics  . Alcohol use: No  . Drug use: No     Allergies   Patient has no known  allergies.   Review of Systems Review of Systems  All other systems reviewed and are negative.    Physical Exam Updated Vital Signs BP (!) 173/83   Pulse 70   Temp 98.1 F (36.7 C) (Oral)   Resp 17   Ht 5\' 6"  (1.676 m)   Wt 77.1 kg (170 lb)   SpO2 99%   BMI 27.44 kg/m   Physical Exam  Constitutional: She is oriented to person, place, and time. She appears well-developed and well-nourished. No distress.  HENT:  Head: Normocephalic and atraumatic.  Eyes: EOM are normal.  Neck: Normal range of motion.  Cardiovascular: Normal rate, regular rhythm and normal heart sounds.  Pulmonary/Chest: Effort normal and breath sounds normal.  Abdominal: Soft. She exhibits no distension.  Mild right upper quadrant abdominal tenderness  Musculoskeletal: Normal range of motion.  Neurological: She is alert and oriented to person, place, and time.  Skin: Skin is warm and dry.  Psychiatric: She has a normal mood and affect. Judgment normal.  Nursing note and vitals reviewed.    ED Treatments / Results  Labs (all labs ordered are listed, but only abnormal results are displayed) Labs Reviewed  COMPREHENSIVE METABOLIC PANEL - Abnormal; Notable for the following components:      Result Value   Sodium 134 (*)    Chloride 98 (*)    Glucose, Bld 232 (*)    All other components within normal limits  CBC - Abnormal; Notable for the following components:   MCH 25.6 (*)    All other components within normal limits  URINALYSIS, ROUTINE W REFLEX MICROSCOPIC - Abnormal; Notable for the following components:   Color, Urine STRAW (*)    Glucose, UA >=500 (*)    Leukocytes, UA SMALL (*)    Bacteria, UA RARE (*)    Squamous Epithelial / LPF 0-5 (*)    All other components within normal limits  LIPASE, BLOOD  I-STAT BETA HCG BLOOD, ED ([facility], [facility], [facility] ONLY)    EKG  EKG Interpretation None       Radiology US Abdomen Complete  Result Date: 12/24/2016 CLINICAL DATA:  Right upper quadrant pain  for 4 days. EXAM: ABDOMEN ULTRASOUND COMPLETE COMPARISON:  CT abdomen and pelvis 04/21/2013. FINDINGS: Gallbladder: Several small stones seen in the gallbladder measuring up to 0.5 cm. There is no gallbladder wall thickening or pericholecystic fluid. Sonographer reports negative Murphy's sign. Common bile duct: Diameter: 0.2 cm Liver: No focal lesion. Echogenicity is somewhat increased. Portal vein is patent on color Doppler imaging with normal direction of blood flow towards the liver. IVC:  No abnormality visualized. Pancreas: Visualized portion unremarkable. Spleen: Size and appearance within normal limits. Right Kidney: Length: 11.5 cm. Echogenicity within normal limits. No mass or hydronephrosis visualized. Left Kidney: Length: 12.7 cm. Echogenicity within normal limits. No mass or hydronephrosis visualized. Abdominal aorta: No aneurysm visualized. Other findings: None. IMPRESSION: Several small gallstones are identified. Negative for acute cholecystitis or other acute abnormality. Mildly increased echogenicity of the liver suggestive of fatty infiltration. Electronically Signed   By: Drusilla Kannerhomas  Dalessio M.D.   On: 12/24/2016 11:13   Ct Abdomen Pelvis W Contrast  Result Date: 12/24/2016 CLINICAL DATA:  46 year old female with right side flank pain for 3 days. EXAM: CT ABDOMEN AND PELVIS WITH CONTRAST TECHNIQUE: Multidetector CT imaging of the abdomen and pelvis was performed using the standard protocol following bolus administration of intravenous contrast. CONTRAST:  100mL ISOVUE-300 IOPAMIDOL (ISOVUE-300) INJECTION 61% COMPARISON:  CT Abdomen and Pelvis 04/21/2013 and earlier. FINDINGS: Lower chest: Stable cardiac size at the upper limits of normal. Negative lung bases. No pericardial or pleural effusion. Hepatobiliary: Chronic punctate cholelithiasis. Otherwise negative CT appearance of the gallbladder. Negative liver. Pancreas: Negative. Spleen: Negative. Adrenals/Urinary Tract: Normal adrenal glands.  Bilateral kidneys appear stable and normal. No hydronephrosis or perinephric stranding. No nephrolithiasis identified. Symmetric and normal appearing contrast excretion on delayed images. Course of the right ureter is within normal limits. Mildly distended but otherwise unremarkable urinary bladder. Stomach/Bowel: Mild to moderate retained stool in the distal colon which otherwise appears negative. Similar volume of retained stool in the left colon which is redundant. Lesser volume of retained stool in the transverse and right colon. Normal appendix (coronal image 46). Negative terminal ileum and distal small bowel aside from flocculated contents. No dilated small bowel. Unchanged appearance of mid abdominal small bowel subjacent to and inseparable from the ventral abdominal wall repair. No inflamed small bowel loops. Decompressed stomach and duodenum. No abdominal free fluid or free air. Vascular/Lymphatic: Abdominal and pelvic arterial structures appear normal. Central venous structures appear patent. The portal venous system is patent. No lymphadenopathy. Reproductive: The uterus is more anteriorly positioned, and the uterine fundus now is inseparable from the undersurface of the ventral abdominal wall repair (sagittal image 79). No associated parametrial inflammation. The left ovary appears normal. The right is not delineated. Other: No pelvic free fluid. Chronic ventral abdominal hernia repair with mesh. Architectural distortion along the ventral lower abdominal wall from the umbilicus inferiorly. Configuration of the mesh in postoperative changes is stable. Musculoskeletal: No acute osseous abnormality identified. IMPRESSION: 1. Previous ventral abdominal wall repair with mesh. Stable postoperative architectural distortion and appearance of small bowel loops subjacent to the mesh. However, the uterine fundus might now be adherent to the caudal aspect of the repair site. 2. No acute or inflammatory process  identified in the abdomen or pelvis. Normal appendix. No urologic calculus or obstructive uropathy. 3. Chronic cholelithiasis. Electronically Signed   By: Odessa FlemingH  Hall M.D.   On: 12/24/2016 12:20    Procedures Procedures (including critical care time)  Medications Ordered in ED Medications  morphine 4 MG/ML injection 4 mg (4 mg Intravenous Given 12/24/16 1022)  ondansetron (ZOFRAN) injection 4 mg (4 mg Intravenous Given 12/24/16 1022)  iopamidol (ISOVUE-300) 61 % injection (100 mLs  Contrast Given 12/24/16 1153)     Initial Impression / Assessment and Plan / ED Course  I have reviewed the triage vital signs and the nursing notes.  Pertinent labs & imaging results that were available during my care of the patient were  reviewed by me and considered in my medical decision making (see chart for details).     1:59 PM Repeat abdominal exam without right upper quadrant tenderness.  Likely biliary colic.  No signs to suggest acute cholecystitis at this time.  Urine shows no signs of infection.  Discharged home in good condition.  [facility] for follow up.   Final Clinical Impressions(s) / ED Diagnoses   Final diagnoses:  Cholecystitis    ED Discharge Orders        Ordered    ondansetron (ZOFRAN ODT) 8 MG disintegrating tablet  Every 8 hours PRN     12/24/16 1356       Azalia Bilisampos, Akirra Lacerda, MD 12/24/16 1400

## 2016-12-24 NOTE — ED Notes (Signed)
ED Provider at bedside. 

## 2016-12-24 NOTE — ED Notes (Signed)
Patient transported to Ultrasound 

## 2016-12-24 NOTE — ED Notes (Signed)
Pt returned from US

## 2016-12-24 NOTE — ED Notes (Signed)
Patient transported to CT 

## 2016-12-24 NOTE — ED Notes (Signed)
Pt verbalized understanding discharge instructions and denies any further needs or questions at this time. VS stable, ambulatory and steady gait.   

## 2016-12-24 NOTE — ED Triage Notes (Signed)
Pt states since Monday she has has right sided flank pain.pt denies any n/v/d.

## 2016-12-25 ENCOUNTER — Other Ambulatory Visit: Payer: Self-pay | Admitting: Pharmacist

## 2016-12-25 MED FILL — ONDANSETRON ODT 8 MG TABLET: 8 | 4 days supply | Qty: 10 | Fill #0

## 2017-01-15 ENCOUNTER — Encounter: Payer: Self-pay | Admitting: Internal Medicine

## 2017-01-15 ENCOUNTER — Other Ambulatory Visit: Payer: Self-pay

## 2017-01-15 ENCOUNTER — Ambulatory Visit: Payer: BC Managed Care – PPO | Admitting: Internal Medicine

## 2017-01-15 VITALS — BP 169/86 | HR 76 | Temp 98.0°F | Ht 66.0 in | Wt 181.0 lb

## 2017-01-15 DIAGNOSIS — K8010 Calculus of gallbladder with chronic cholecystitis without obstruction: Secondary | ICD-10-CM

## 2017-01-15 DIAGNOSIS — Z7984 Long term (current) use of oral hypoglycemic drugs: Secondary | ICD-10-CM | POA: Diagnosis not present

## 2017-01-15 DIAGNOSIS — Z8719 Personal history of other diseases of the digestive system: Secondary | ICD-10-CM

## 2017-01-15 DIAGNOSIS — I10 Essential (primary) hypertension: Secondary | ICD-10-CM

## 2017-01-15 DIAGNOSIS — K8020 Calculus of gallbladder without cholecystitis without obstruction: Secondary | ICD-10-CM | POA: Insufficient documentation

## 2017-01-15 DIAGNOSIS — I5030 Unspecified diastolic (congestive) heart failure: Secondary | ICD-10-CM | POA: Diagnosis not present

## 2017-01-15 DIAGNOSIS — E119 Type 2 diabetes mellitus without complications: Secondary | ICD-10-CM

## 2017-01-15 DIAGNOSIS — Z79899 Other long term (current) drug therapy: Secondary | ICD-10-CM | POA: Diagnosis not present

## 2017-01-15 DIAGNOSIS — R011 Cardiac murmur, unspecified: Secondary | ICD-10-CM

## 2017-01-15 DIAGNOSIS — Z0189 Encounter for other specified special examinations: Secondary | ICD-10-CM

## 2017-01-15 DIAGNOSIS — L02818 Cutaneous abscess of other sites: Secondary | ICD-10-CM

## 2017-01-15 DIAGNOSIS — I110 Hypertensive heart disease with heart failure: Secondary | ICD-10-CM

## 2017-01-15 DIAGNOSIS — E118 Type 2 diabetes mellitus with unspecified complications: Secondary | ICD-10-CM | POA: Diagnosis not present

## 2017-01-15 DIAGNOSIS — I4891 Unspecified atrial fibrillation: Secondary | ICD-10-CM

## 2017-01-15 DIAGNOSIS — Z0000 Encounter for general adult medical examination without abnormal findings: Secondary | ICD-10-CM

## 2017-01-15 DIAGNOSIS — I480 Paroxysmal atrial fibrillation: Secondary | ICD-10-CM | POA: Diagnosis not present

## 2017-01-15 LAB — POCT GLYCOSYLATED HEMOGLOBIN (HGB A1C): HEMOGLOBIN A1C: 11.4

## 2017-01-15 LAB — GLUCOSE, CAPILLARY: Glucose-Capillary: 213 mg/dL — ABNORMAL HIGH (ref 65–99)

## 2017-01-15 MED ORDER — RIVAROXABAN 20 MG PO TABS
20.0000 mg | ORAL_TABLET | Freq: Every day | ORAL | 11 refills | Status: DC
Start: 2017-01-15 — End: 2017-03-25

## 2017-01-15 MED ORDER — METFORMIN HCL 1000 MG PO TABS: 1000.0000 mg | ORAL_TABLET | Freq: Two times a day (BID) | ORAL | 5 refills | Status: DC

## 2017-01-15 MED FILL — metFORMIN HCL 1000 MG TABS: 1000 | 30 days supply | Qty: 60 | Fill #0

## 2017-01-15 NOTE — Patient Instructions (Signed)
For your diabetes, increase metformin to 1000mg  twice a day with meals. We will plan on starting a second medication at your follow up appointment.  For your blood pressure, continue what you are taking now, I will try to call the pharmacy to get your records as to what you are taking.  Please follow up with surgery for your pain.  Start taking xarelto daily as a blood thinner to prevent strokes due to your irregular heart rhythm.   Please come back in 2 weeks and bring all your medicines with you.

## 2017-01-15 NOTE — Progress Notes (Signed)
CC: cholelithiasis  HPI:  [name] is a 47 y.o. with a PMH of HTN, T2DM, cholelithiasis, paroxysmal A fib presenting to clinic to establish care and for management of HTN, T2DM, and evaluation of right lateral abd pain.  T2DM: Patient with h/o T2DM on an unknown dosage of metformin BID. From chart review it appears that at some point she was also on Insulin; patient however denies ever being on anything other than metformin. She declines ever knowing what an A1c level is or what her most recent one was at her previous PCP. She endorses a subjective, unintentional weight loss of ~10lbs over an unknown period of time, as well as polydipsia and polyuria. She denies blurry vision, nausea, vomiting, abd pain.   HTN: Patient on an unknown BP med daily, which she states she takes regularly. She denies headaches, vision or hearing changes, chest pain, shortness of breath, focal weakness/numbness/tingling.   Paroxysmal A fib: Patient with initial presentation in our system in 2012 with symptomatic a fib w/ RVR and was placed on diltiazem. She had several further presentations and eventually f/u with cardiology who placed her on flecainide for rhythm control. TEE was performed in 2012 at initial presentation which showed mildly rheumatic appearing MV with trivial MS and MR, trivial TR, dilated LA. Most recent echo in 2016 shows preserved EF 65-70%, w/ G1DD with calcified mitral valve annulus and mildly thickened leaflets. She was on Xarelto for an indeterminate amount of time and stopped it by herself; there is also some documentation of her being on coumadin for some time.   Cholelithiasis: Patient seen in ED in Dec 2018 for complaints of right side and back pain; she had an abd u/s which showed cholelithiasis w/o cholecystitis findings; CT abd was negative for other etiologies. Lab work at the time showed no leukocytosis,  hyperglycemia to 232, with normal renal function, normal liver panel and  noninfectious UA (though >500 glucosuria). Today she describes continued intermittent RUQ pain that sometimes radiates laterally to her back with nothing exacerbating her symptoms and symptoms are self limiting. She denies nausea, vomiting, fevers, dysuria, hematuria. She endorses feeling bumps on her back where the referred pain was but these are resolving and not tender any longer.  Furthermore, patient denies cough, fevers, chills, nightsweats, dysuria, hematuria, abn vaginal bleeding, constipation, diarrhea, hematochezia, melena, loss of appetite.  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 She denies prior or current use of tobacco, EtOH or illicit drugs. She was born in [place] and emigrated in 2004.  Allergies:  She denies known medication allergies.  She states that she may remember a PPD test upon arrival to [place] and thinks she may have been on medications briefly related to the test but not for 9 mos.  Release of records signed by patient to obtain records from previous PCP at [facility].  Please see problem based Assessment and Plan for status of patients chronic conditions.  Past Medical History:  Diagnosis Date  . Atrial fibrillation with rapid ventricular response (HCC)    PAF new onset  . Diabetes mellitus    uncontrolled  . Diabetes mellitus    a. 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 b.  05/2012 A1c 12.6.  . History of prolonged Q-T interval on ECG    a. 09/2012 adm: QTC 506.  Marland Kitchen. Hx gestational diabetes   . Hypertension   . Noncompliance   . PAF (paroxysmal atrial fibrillation) (HCC)    a. 07/2011 ED visit with AF-> converted with flecainide, never followed-up;  b. 05/2012 Recurrent  Afib -echo: EF 65-60%, mildly dil LA. Placed on Xarelto/daily Flecainide. c. 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  . Small bowel obstruction (HCC) 09/2010   resolved    Review of Systems:   ROS Per HPI  Physical Exam:  Vitals:   01/15/17 1509  BP: (!) 169/86  Pulse: 76  Temp: 98 F (36.7 C)  TempSrc: Oral  SpO2: 100%  Weight: 181 lb (82.1 kg)  Height: 5\' 6"  (1.676 m)   GENERAL- alert, co-operative, appears as stated age, not in any distress. HEENT- Atraumatic, normocephalic, EOMI, oral mucosa appears moist CARDIAC- RRR, systolic murmur 2/6 best heard at LUSB, no rubs or gallops. RESP- Moving equal volumes of air, and clear to auscultation bilaterally, no wheezes or crackles. ABDOMEN- Soft, bowel sounds present, mild RUQ TTP, w/o rebound. NEURO- No obvious Cr N abnormality. EXTREMITIES- pulse 2+ radial and PT bil, no pedal edema. SKIN- 2 small healing, nondraining abscesses on R post rib cage where patient stated referred pain was previously. PSYCH- Normal mood and affect, appropriate thought content and speech.  Assessment & Plan:   See Encounters Tab for problem based charting.   Patient discussed with Dr. Cleda DaubE. Hoffman   Sarah MarketGorica Clevester Helzer, MD Internal Medicine PGY2

## 2017-01-16 LAB — LIPID PANEL
Chol/HDL Ratio: 2.1 ratio (ref 0.0–4.4)
Cholesterol, Total: 109 mg/dL (ref 100–199)
HDL: 51 mg/dL (ref >39)
LDL Calculated: 17 mg/dL (ref 0–99)
Triglycerides: 203 mg/dL — ABNORMAL HIGH (ref 0–149)
VLDL Cholesterol Cal: 41 mg/dL — ABNORMAL HIGH (ref 5–40)

## 2017-01-17 ENCOUNTER — Encounter: Payer: Self-pay | Admitting: Internal Medicine

## 2017-01-17 DIAGNOSIS — Z0000 Encounter for general adult medical examination without abnormal findings: Secondary | ICD-10-CM | POA: Insufficient documentation

## 2017-01-17 NOTE — Assessment & Plan Note (Addendum)
Release of records signed by patient to obtain records from previous PCP at [facility].  She states that she may remember a PPD test upon arrival to [place] and thinks she may have been on medications briefly related to the test but not for 9 mos. Will get a release of records for the Health department regarding TB status at f/u visit.  Will address HCM based on these records.

## 2017-01-17 NOTE — Assessment & Plan Note (Signed)
Patient on an unknown BP med daily, which she states she takes regularly. She denies headaches, vision or hearing changes, chest pain, shortness of breath, focal weakness/numbness/tingling.  Today she is uncontrolled and asymptomatic.  Plan: --she will return in 2 wks with her medicines from home; will try to obtain refill records from her pharmacy. --for now she will continue what she has been on

## 2017-01-17 NOTE — Assessment & Plan Note (Signed)
>>  ASSESSMENT AND PLAN FOR ESSENTIAL HYPERTENSION WRITTEN ON 01/17/2017  4:14 PM BY LORETA LEVELS, MD  Patient on an unknown BP med daily, which she states she takes regularly. She denies headaches, vision or hearing changes, chest pain, shortness of breath, focal weakness/numbness/tingling.  Today she is uncontrolled and asymptomatic.  Plan: --she will return in 2 wks with her medicines from home; will try to obtain refill records from her pharmacy. --for now she will continue what she has been on

## 2017-01-17 NOTE — Assessment & Plan Note (Signed)
Patient with initial presentation in our system in 2012 with symptomatic a fib w/ RVR and was placed on diltiazem. She had several further presentations and eventually f/u with cardiology who placed her on flecainide for rhythm control. TEE was performed in 2012 at initial presentation which showed mildly rheumatic appearing MV with trivial MS and MR, trivial TR, dilated LA. Most recent echo in 2016 shows preserved EF 65-70%, w/ G1DD with calcified mitral valve annulus and mildly thickened leaflets. She was on Xarelto for an indeterminate amount of time and stopped it by herself; there is also some documentation of her being on coumadin for some time. She denies previous s/sx of bleeding on Xarelto.  On exam, she has RRR, with a 2/6 systolic murmur best heard at LUSB (present previously).  CHADS2VASC2 score of 3 indicating strong recommendation for anticoagulation.  Plan: --start xarelto 20mg  daily  --RRR today and on recent EKGs she's been in SR; will hold off on starting rate control as unknown what her antihypertensive therapy is currently.

## 2017-01-17 NOTE — Assessment & Plan Note (Signed)
Patient seen in ED in Dec 2018 for complaints of right side and back pain; she had an abd u/s which showed cholelithiasis w/o cholecystitis findings; CT abd was negative for other etiologies. Lab work at the time showed no leukocytosis,  hyperglycemia to 232, with normal renal function, normal liver panel and noninfectious UA (though >500 glucosuria). Today she describes continued intermittent RUQ pain that sometimes radiates laterally to her back with nothing exacerbating her symptoms and symptoms are self limiting. She denies nausea, vomiting, fevers, dysuria, hematuria. She endorses feeling bumps on her back where the referred pain was but these are resolving and not tender any longer.  Exam reveals TTP of RUQ. Exam of back reveals resolving small, sub cm abscesses.  Plan: --Advised patient f/u with outpt surgery as advised by ED for evaluation and management of symptomatic cholelithiasis. --advised not to scratch at skin as she is at very high risk of infections with her uncontrolled hyperglycemia.

## 2017-01-17 NOTE — Assessment & Plan Note (Signed)
Patient with h/o T2DM on an unknown dosage of metformin BID (she thinks 500mg ). From chart review it appears that at some point she was also on Insulin; patient however denies ever being on anything other than metformin. She declines ever knowing what an A1c level is or what her most recent one was at her previous PCP. She endorses a subjective, unintentional weight loss of ~10lbs over an unknown period of time, as well as polydipsia and polyuria. She denies blurry vision, nausea, vomiting, abd pain.  Plan: --A1c today 11.4 -  Uncontrolled; no signs or symptoms of HHS/DKA --increase to Metformin 1000mg  BID - new script sent to her pharmacy --will get lipid panel to classify her ASCVD risk score  --at f/u in 2 wks will plan to start second agent

## 2017-01-18 NOTE — Progress Notes (Signed)
Internal Medicine Clinic Attending  Case discussed with Dr. Svalina  at the time of the visit.  We reviewed the resident's history and exam and pertinent patient test results.  I agree with the assessment, diagnosis, and plan of care documented in the resident's note.  

## 2017-01-22 ENCOUNTER — Ambulatory Visit: Payer: Self-pay

## 2017-01-29 ENCOUNTER — Ambulatory Visit: Payer: Self-pay

## 2017-02-05 ENCOUNTER — Other Ambulatory Visit: Payer: Self-pay

## 2017-02-05 ENCOUNTER — Ambulatory Visit: Payer: BC Managed Care – PPO | Admitting: Internal Medicine

## 2017-02-05 VITALS — BP 164/85 | HR 79 | Temp 98.1°F | Ht 66.0 in | Wt 179.6 lb

## 2017-02-05 DIAGNOSIS — Z7984 Long term (current) use of oral hypoglycemic drugs: Secondary | ICD-10-CM | POA: Diagnosis not present

## 2017-02-05 DIAGNOSIS — I10 Essential (primary) hypertension: Secondary | ICD-10-CM | POA: Diagnosis not present

## 2017-02-05 DIAGNOSIS — Z79899 Other long term (current) drug therapy: Secondary | ICD-10-CM

## 2017-02-05 DIAGNOSIS — E118 Type 2 diabetes mellitus with unspecified complications: Secondary | ICD-10-CM

## 2017-02-05 DIAGNOSIS — E119 Type 2 diabetes mellitus without complications: Secondary | ICD-10-CM

## 2017-02-05 MED ORDER — LISINOPRIL-HYDROCHLOROTHIAZIDE 20-12.5 MG PO TABS: 2.0000 | ORAL_TABLET | Freq: Every day | ORAL | 0 refills | Status: DC

## 2017-02-05 MED FILL — LISINOPRIL-HCTZ 20-12.5 MG: 20-12.5 | 90 days supply | Qty: 180 | Fill #0

## 2017-02-05 NOTE — Progress Notes (Signed)
   CC: Patient is here for a follow-up of hypertension and diabetes.  HPI:  [name] is a 47 y.o. female with a past medical history of conditions listed below presenting to the clinic for a follow-up of hypertension and diabetes. Please see problem based charting for the status of the patient's current and chronic medical conditions.   Past Medical History:  Diagnosis Date  . Diabetes mellitus    a. 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 b. 05/2012 A1c 12.6.  . History of prolonged Q-T interval on ECG    a. 09/2012 adm: QTC 506.  Marland Kitchen. Hx gestational diabetes   . Hypertension   . PAF (paroxysmal atrial fibrillation) (HCC) 2012   a. 07/2011 ED visit with AF-> converted with flecainide, never followed-up;  b. 05/2012 Recurrent Afib -echo: EF 65-60%, mildly dil LA. Placed on Xarelto/daily Flecainide. c. 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  . Small bowel obstruction (HCC) 09/2010   resolved   Review of Systems:  Review of Systems  Constitutional: Negative for chills and fever.  Respiratory: Negative for shortness of breath.   Cardiovascular: Negative for chest pain.  Neurological: Negative for headaches.    Physical Exam:  Vitals:   02/05/17 1524  BP: (!) 164/85  Pulse: 79  Temp: 98.1 F (36.7 C)  TempSrc: Oral  SpO2: 100%  Weight: 179 lb 9.6 oz (81.5 kg)  Height: 5\' 6"  (1.676 m)   Physical Exam  Constitutional: She is oriented to person, place, and time. She appears well-developed and well-nourished. No distress.  HENT:  Head: Normocephalic and atraumatic.  Mouth/Throat: Oropharynx is clear and moist.  Eyes: Right eye exhibits no discharge. Left eye exhibits no discharge.  Cardiovascular: Normal rate, regular rhythm and intact distal pulses.  Pulmonary/Chest: Effort normal and breath sounds normal. No respiratory distress. She has no wheezes. She has no rales.  Abdominal: Soft. Bowel sounds are normal. She exhibits no distension. There  is no tenderness.  Musculoskeletal: She exhibits no edema.  Neurological: She is alert and oriented to person, place, and time.  Skin: Skin is warm and dry.    Assessment & Plan:   See Encounters Tab for problem based charting.  Patient discussed with Dr. Rogelia BogaButcher

## 2017-02-05 NOTE — Assessment & Plan Note (Signed)
>>  ASSESSMENT AND PLAN FOR ESSENTIAL HYPERTENSION WRITTEN ON 02/05/2017  5:46 PM BY ALFORNIA MADISON, MD  BP Readings from Last 3 Encounters:  02/05/17 (!) 164/85  01/15/17 (!) 169/86  12/24/16 (!) 149/78    Lab Results  Component Value Date   NA 134 (L) 12/24/2016   K 4.1 12/24/2016   CREATININE 0.75 12/24/2016    Assessment: Blood pressure control:  Above goal Comments: Currently taking lisinopril -hydrochlorothiazide  20-12.5 mg once daily.  Labs from December 2018 showing normal renal function and potassium level.  Plan: Medications: Increased dose to lisinopril -hydrochlorothiazide  40-25 mg daily. Educational resources provided:   Educated patient about healthy eating and exercise. Other plans: Return to the clinic in 1 month for blood pressure recheck and repeat BMP.

## 2017-02-05 NOTE — Patient Instructions (Addendum)
[name] it was nice meeting you today.  -Dose of your blood pressure medication has been increased. Take Lisinopril-Hydrochlorothiazide 20-12.5 mg 2 tablets daily.  -Continue taking metformin 1000 mg twice daily  -Please return to the clinic on Monday, February 08, 2017 to discuss starting insulin for your diabetes.   -Please also return to the clinic in 1 month for blood pressure recheck and labs

## 2017-02-05 NOTE — Assessment & Plan Note (Signed)
Lab Results  Component Value Date   HGBA1C 11.4 01/15/2017   HGBA1C 11.9 (H) 12/29/2012   HGBA1C 13.0 (H) 09/30/2012     Assessment: Diabetes control:  Poorly controlled Comments: Currently taking metformin 1000 mg twice daily.  Discussed starting the patient on insulin as she continues to have poor glycemic control since 2014.  Patient stated she was on insulin in the past but had to stop because of insurance problems.  She is interested in starting insulin again.  Advised her to return to the clinic on Monday, February 08, 2017 when Lupita LeashDonna is available.  Insulin and testing supplies will be prescribed at that time.  Plan: Medications:  continue current medications Instruction/counseling given: reminded to bring medications to each visit and discussed diet Other plans: Referral to Lupita LeashDonna has been placed.

## 2017-02-05 NOTE — Assessment & Plan Note (Signed)
BP Readings from Last 3 Encounters:  02/05/17 (!) 164/85  01/15/17 (!) 169/86  12/24/16 (!) 149/78    Lab Results  Component Value Date   NA 134 (L) 12/24/2016   K 4.1 12/24/2016   CREATININE 0.75 12/24/2016    Assessment: Blood pressure control:  Above goal Comments: Currently taking lisinopril-hydrochlorothiazide 20-12.5 mg once daily.  Labs from December 2018 showing normal renal function and potassium level.  Plan: Medications: Increased dose to lisinopril-hydrochlorothiazide 40-25 mg daily. Educational resources provided:   Educated patient about healthy eating and exercise. Other plans: Return to the clinic in 1 month for blood pressure recheck and repeat BMP.

## 2017-02-08 NOTE — Progress Notes (Signed)
Internal Medicine Clinic Attending  Case discussed with Dr. Rathoreat the time of the visit. We reviewed the resident's history and exam and pertinent patient test results. I agree with the assessment, diagnosis, and plan of care documented in the resident's note.  

## 2017-03-01 ENCOUNTER — Ambulatory Visit: Payer: Self-pay

## 2017-03-01 ENCOUNTER — Encounter: Payer: Self-pay | Admitting: Dietician

## 2017-03-02 ENCOUNTER — Encounter: Payer: Self-pay | Admitting: Dietician

## 2017-03-05 ENCOUNTER — Encounter: Payer: Self-pay | Admitting: Dietician

## 2017-03-05 ENCOUNTER — Encounter: Payer: Self-pay | Admitting: Internal Medicine

## 2017-03-05 ENCOUNTER — Ambulatory Visit (INDEPENDENT_AMBULATORY_CARE_PROVIDER_SITE_OTHER): Payer: BC Managed Care – PPO | Admitting: Internal Medicine

## 2017-03-05 ENCOUNTER — Ambulatory Visit (INDEPENDENT_AMBULATORY_CARE_PROVIDER_SITE_OTHER): Payer: BC Managed Care – PPO | Admitting: Dietician

## 2017-03-05 VITALS — BP 161/82 | HR 84 | Temp 98.4°F | Wt 174.5 lb

## 2017-03-05 DIAGNOSIS — Z79899 Other long term (current) drug therapy: Secondary | ICD-10-CM

## 2017-03-05 DIAGNOSIS — Z7984 Long term (current) use of oral hypoglycemic drugs: Secondary | ICD-10-CM

## 2017-03-05 DIAGNOSIS — Z713 Dietary counseling and surveillance: Secondary | ICD-10-CM | POA: Diagnosis not present

## 2017-03-05 DIAGNOSIS — I480 Paroxysmal atrial fibrillation: Secondary | ICD-10-CM | POA: Diagnosis not present

## 2017-03-05 DIAGNOSIS — R011 Cardiac murmur, unspecified: Secondary | ICD-10-CM

## 2017-03-05 DIAGNOSIS — E119 Type 2 diabetes mellitus without complications: Secondary | ICD-10-CM

## 2017-03-05 DIAGNOSIS — Z6828 Body mass index (BMI) 28.0-28.9, adult: Secondary | ICD-10-CM

## 2017-03-05 DIAGNOSIS — I4891 Unspecified atrial fibrillation: Secondary | ICD-10-CM

## 2017-03-05 DIAGNOSIS — G510 Bell's palsy: Secondary | ICD-10-CM

## 2017-03-05 DIAGNOSIS — I10 Essential (primary) hypertension: Secondary | ICD-10-CM | POA: Diagnosis not present

## 2017-03-05 DIAGNOSIS — E118 Type 2 diabetes mellitus with unspecified complications: Secondary | ICD-10-CM

## 2017-03-05 HISTORY — DX: Bell's palsy: G51.0

## 2017-03-05 MED ORDER — BAYER MICROLET LANCETS MISC: 12 refills | Status: DC

## 2017-03-05 MED ORDER — AMLODIPINE BESYLATE 5 MG PO TABS: 5.0000 mg | ORAL_TABLET | Freq: Every day | ORAL | 5 refills | Status: DC

## 2017-03-05 MED ORDER — GLUCOSE BLOOD VI STRP: ORAL_STRIP | 12 refills | Status: DC

## 2017-03-05 MED ORDER — METFORMIN HCL 1000 MG PO TABS: 1000.0000 mg | ORAL_TABLET | Freq: Two times a day (BID) | ORAL | 5 refills | Status: DC

## 2017-03-05 MED FILL — AMLODIPINE BESYLATE 5 MG TA: 5 | 30 days supply | Qty: 30 | Fill #0

## 2017-03-05 MED FILL — XARELTO 20 MG TABLET: 20 | 30 days supply | Qty: 30 | Fill #0

## 2017-03-05 MED FILL — metFORMIN HCL 1000 MG TABS: 1000 | 30 days supply | Qty: 60 | Fill #0

## 2017-03-05 NOTE — Assessment & Plan Note (Signed)
>>  ASSESSMENT AND PLAN FOR ESSENTIAL HYPERTENSION WRITTEN ON 03/05/2017  2:54 PM BY WALLACE, ANDREW, DO  BP Readings from Last 3 Encounters:  03/05/17 (!) 161/82  02/05/17 (!) 164/85  01/15/17 (!) 169/86   Assessment: She reports adherence to lisinopril -HCTZ 40-25mg  combination and took her meds this morning.  No symptoms of vision change, chest pain, or headaches.  BP remains elevated today despite her current treatment.  She is not on any nodal blocking agents despite her HR being under control today.  Plan: - continue lisinopril -HCTZ combination - add amlodipine  5mg  daily.  Can titrate up if needed.  Also could consider addition of nodal blocker in the future given her hx of atrial fibrillation - BMET today - RTC 2 months

## 2017-03-05 NOTE — Patient Instructions (Signed)
Try Lactose free milk or Soy milk instead of cow's milk  Eat twice as many vegetables as starchy foods like rice.   Starchy vegetables are  potatoes, beans, corn, sweet potatoes, limas, plantains  All the other vegetables have less starch and will not increase your blood sugar as much.  Please make a follow up on the same day you see the doctor or in 1 month whichever comes first  Good job taking care of your diabetes!  Lupita LeashDonna [phone]

## 2017-03-05 NOTE — Assessment & Plan Note (Signed)
BP Readings from Last 3 Encounters:  03/05/17 (!) 161/82  02/05/17 (!) 164/85  01/15/17 (!) 169/86   Assessment: She reports adherence to lisinopril-HCTZ 40-25mg  combination and took her meds this morning.  No symptoms of vision change, chest pain, or headaches.  BP remains elevated today despite her current treatment.  She is not on any nodal blocking agents despite her HR being under control today.  Plan: - continue lisinopril-HCTZ combination - add amlodipine 5mg  daily.  Can titrate up if needed.  Also could consider addition of nodal blocker in the future given her hx of atrial fibrillation - BMET today - RTC 2 months

## 2017-03-05 NOTE — Patient Instructions (Signed)
FOLLOW-UP INSTRUCTIONS When: in 2 months For: diabetes and blood pressure follow up What to bring:  Your medications and glucose meter  We are starting a new medication for blood pressure today called amlodipine.  You will take 5mg  once per day.  Continue to take all your other medications as prescribed.  Please pick up your blood thinner medication at the pharmacy as well called Xarelto.  This is important because of your history of atrial fibrillation.  I have refilled your metformin as well.  Take care

## 2017-03-05 NOTE — Assessment & Plan Note (Signed)
Assessment: Rate is controlled today.  She is not on nodal blocker.  Has been on flecainide in the past.  Not taking her Xarelto as prescribed by Dr Samuella CotaSvalina in January.  No symptoms of SOB or palpitations.  Plan: - Recommended she resume Xarelto as previously instructed given her CHADS2VASC2 of 3 (sex, DM, HTN) - Consider nodal blocker in the future both for her atrial fibrillation and HTN

## 2017-03-05 NOTE — Progress Notes (Signed)
CC: here for HTN and DM f/u  HPI:  [name] is a 47 y.o. woman with a past medical history listed below here today for follow up of her HTN and DM.  For details of today's visit and the status of her chronic medical issues please refer to the assessment and plan.   Past Medical History:  Diagnosis Date  . Diabetes mellitus    a. 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 b. 05/2012 A1c 12.6.  . History of prolonged Q-T interval on ECG    a. 09/2012 adm: QTC 506.  Marland Kitchen Hx gestational diabetes   . Hypertension   . PAF (paroxysmal atrial fibrillation) ([facility]) 2012   a. 07/2011 ED visit with AF-> converted with flecainide, never followed-up;  b. 05/2012 Recurrent Afib -echo: EF 65-60%, mildly dil LA. Placed on Xarelto/daily Flecainide. c. 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  . Small bowel obstruction ([facility]) 09/2010   resolved   Review of Systems:  Please see pertinent ROS reviewed in HPI and problem based charting.   Physical Exam:  Vitals:   03/05/17 1428  BP: (!) 161/82  Pulse: 84  Temp: 98.4 F (36.9 C)  TempSrc: Oral  SpO2: 100%  Weight: 174 lb 8 oz (79.2 kg)   Physical Exam  Constitutional: She is oriented to person, place, and time and well-developed, well-nourished, and in no distress.  Eyes: EOM are normal. No scleral icterus.  Cardiovascular: Normal rate.  Murmur heard. Irregular rhythm.  Soft 2/6 systolic murmur  Pulmonary/Chest: Effort normal and breath sounds normal.  Neurological: She is alert and oriented to person, place, and time.  Skin: Skin is warm and dry.  Psychiatric: Mood and affect normal.     Assessment & Plan:   See Encounters Tab for problem based charting.  Patient discussed with Dr. Lynnae January.  HTN (hypertension) BP Readings from Last 3 Encounters:  03/05/17 (!) 161/82  02/05/17 (!) 164/85  01/15/17 (!) 169/86   Assessment: She reports adherence to lisinopril-HCTZ 40-69m combination and took her meds  this morning.  No symptoms of vision change, chest pain, or headaches.  BP remains elevated today despite her current treatment.  She is not on any nodal blocking agents despite her HR being under control today.  Plan: - continue lisinopril-HCTZ combination - add amlodipine 534mdaily.  Can titrate up if needed.  Also could consider addition of nodal blocker in the future given her hx of atrial fibrillation - BMET today - RTC 2 months   Atrial fibrillation with RVR Assessment: Rate is controlled today.  She is not on nodal blocker.  Has been on flecainide in the past.  Not taking her Xarelto as prescribed by Dr SvJari Favren January.  No symptoms of SOB or palpitations.  Plan: - Recommended she resume Xarelto as previously instructed given her CHADS2VASC2 of 3 (sex, DM, HTN) - Consider nodal blocker in the future both for her atrial fibrillation and HTN  Diabetes mellitus Assessment: Her A1c in January was 11.4.  She had her metformin increased to 100040mID which she is taking without issue.  She met with diabetes educator today and her post prandial CBG was 132.  She has been making an effort to eat more vegetables and less carbs.  Plan: - given her post prandial glucose of 132 we will defer starting insulin at this time - continue metformin 1000m18mD - f/u with diabetes educator for continued support - repeat A1c in  1-2 months.

## 2017-03-05 NOTE — Progress Notes (Signed)
  Medical Nutrition Therapy:  Appt start time: 1345 end time:  1425. Visit # 1  Assessment:  Primary concerns today: diabetes education/meal planning.  [name] says she used to be on insulin and monitor her blood sugars until it got too expensive without insurance. She has many good questions and shows interest in learning about diabetes Preferred Learning Style: No preference indicated  Learning Readiness: Ready and  Change in progress  ANTHROPOMETRICS: weight-174.5#,, BMI-28 SLEEP:need to assess at a future visit MEDICATIONS: metformin 100 mg twice daily BLOOD SUGAR:132 today ~2.25 hours after eating,  Lab Results  Component Value Date   HGBA1C 11.4 01/15/2017   Lipid Panel     Component Value Date/Time   CHOL 109 01/15/2017 1645   TRIG 203 (H) 01/15/2017 1645   HDL 51 01/15/2017 1645   CHOLHDL 2.1 01/15/2017 1645   CHOLHDL 2.1 05/24/2012 0410   VLDL 21 05/24/2012 0410   LDLCALC 17 01/15/2017 1645     DIETARY INTAKE: Usual eating pattern includes 2-3 meals per day. Everyday foods include rice beans, fish.  Avoided foods include milk and eggs give her loose stools.   24-hr recall:  B ( 8 AM): apple or oatmeal L (12 PM): rice, beans, fish, broccoli ( largest meal) D ( 5 PM): smaller amount of rice, beans, fish and vegetables  Usual physical activity: her job is active  Progress Towards Goal(s):  In progress.   Nutritional Diagnosis:  NB-1.1 Food and nutrition-related knowledge deficit As related to lack od sufficient prior diabetes training.  As evidenced by her report and questions.    Intervention:  Nutrition education about meal planning, what is diabetes  and self monitoring. Coordination of care: needs test strips and lancets  Teaching Method Utilized: Visual, Auditory, Hands on,   Handouts given during visit include:meter, what is diabetes, sharps container  Barriers to learning/adherence to lifestyle change: lack of material resources Demonstrated degree of  understanding via:  Teach Back   Monitoring/Evaluation:  Dietary intake, exercise, meter, and body weight in 1 month(s)  Norm Parcelonna Maebel Marasco, RD 03/05/2017 2:49 PM. .

## 2017-03-05 NOTE — Assessment & Plan Note (Signed)
Assessment: Her A1c in January was 11.4.  She had her metformin increased to 1040m BID which she is taking without issue.  She met with diabetes educator today and her post prandial CBG was 132.  She has been making an effort to eat more vegetables and less carbs.  Plan: - given her post prandial glucose of 132 we will defer starting insulin at this time - continue metformin 10031mBID - f/u with diabetes educator for continued support - repeat A1c in 1-2 months.

## 2017-03-06 LAB — BMP8+ANION GAP
Anion Gap: 18 mmol/L (ref 10.0–18.0)
BUN / CREAT RATIO: 17 (ref 9–23)
BUN: 15 mg/dL (ref 6–24)
CO2: 25 mmol/L (ref 20–29)
Calcium: 10.2 mg/dL (ref 8.7–10.2)
Chloride: 100 mmol/L (ref 96–106)
Creatinine, Ser: 0.9 mg/dL (ref 0.57–1.00)
GFR calc Af Amer: 89 mL/min/{1.73_m2} (ref >59)
GFR, EST NON AFRICAN AMERICAN: 77 mL/min/{1.73_m2} (ref >59)
Glucose: 127 mg/dL — ABNORMAL HIGH (ref 65–99)
Potassium: 4.4 mmol/L (ref 3.5–5.2)
Sodium: 143 mmol/L (ref 134–144)

## 2017-03-08 ENCOUNTER — Other Ambulatory Visit: Payer: Self-pay | Admitting: *Deleted

## 2017-03-08 MED ORDER — ONETOUCH DELICA LANCETS FINE MISC: 12 refills | Status: DC

## 2017-03-08 MED ORDER — GLUCOSE BLOOD VI STRP: ORAL_STRIP | 12 refills | Status: DC

## 2017-03-09 MED FILL — ONE TOUCH ULTRA TEST STRIPS: 50 days supply | Qty: 100 | Fill #0

## 2017-03-09 NOTE — Progress Notes (Signed)
Internal Medicine Clinic Attending  Case discussed with Dr. Wallace at the time of the visit.  We reviewed the resident's history and exam and pertinent patient test results.  I agree with the assessment, diagnosis, and plan of care documented in the resident's note.  

## 2017-03-09 NOTE — Progress Notes (Signed)
Please place orders in Epic as patient is being scheduled for a pre-op appointment! Thank you! 

## 2017-03-10 ENCOUNTER — Ambulatory Visit: Payer: Self-pay | Admitting: General Surgery

## 2017-03-11 ENCOUNTER — Observation Stay (HOSPITAL_COMMUNITY): Payer: BC Managed Care – PPO

## 2017-03-11 ENCOUNTER — Observation Stay (HOSPITAL_COMMUNITY)
Admission: AD | Admit: 2017-03-11 | Discharge: 2017-03-12 | Disposition: A | Discharge status: Home / Self Care | Payer: BC Managed Care – PPO | Source: Ambulatory Visit | Attending: Oncology | Admitting: Oncology

## 2017-03-11 ENCOUNTER — Other Ambulatory Visit: Payer: Self-pay

## 2017-03-11 ENCOUNTER — Ambulatory Visit (INDEPENDENT_AMBULATORY_CARE_PROVIDER_SITE_OTHER): Payer: BC Managed Care – PPO | Admitting: Internal Medicine

## 2017-03-11 VITALS — BP 180/97 | HR 73 | Temp 98.6°F | Ht 66.0 in | Wt 174.4 lb

## 2017-03-11 DIAGNOSIS — I4581 Long QT syndrome: Secondary | ICD-10-CM | POA: Diagnosis not present

## 2017-03-11 DIAGNOSIS — I480 Paroxysmal atrial fibrillation: Secondary | ICD-10-CM | POA: Diagnosis not present

## 2017-03-11 DIAGNOSIS — I4891 Unspecified atrial fibrillation: Secondary | ICD-10-CM

## 2017-03-11 DIAGNOSIS — R200 Anesthesia of skin: Secondary | ICD-10-CM

## 2017-03-11 DIAGNOSIS — Z8709 Personal history of other diseases of the respiratory system: Secondary | ICD-10-CM | POA: Diagnosis not present

## 2017-03-11 DIAGNOSIS — R0789 Other chest pain: Secondary | ICD-10-CM | POA: Insufficient documentation

## 2017-03-11 DIAGNOSIS — R29810 Facial weakness: Secondary | ICD-10-CM

## 2017-03-11 DIAGNOSIS — R51 Headache: Secondary | ICD-10-CM

## 2017-03-11 DIAGNOSIS — G510 Bell's palsy: Secondary | ICD-10-CM | POA: Diagnosis not present

## 2017-03-11 DIAGNOSIS — R10811 Right upper quadrant abdominal tenderness: Secondary | ICD-10-CM | POA: Diagnosis not present

## 2017-03-11 DIAGNOSIS — I10 Essential (primary) hypertension: Secondary | ICD-10-CM

## 2017-03-11 DIAGNOSIS — Z7984 Long term (current) use of oral hypoglycemic drugs: Secondary | ICD-10-CM | POA: Diagnosis not present

## 2017-03-11 DIAGNOSIS — E119 Type 2 diabetes mellitus without complications: Secondary | ICD-10-CM | POA: Insufficient documentation

## 2017-03-11 DIAGNOSIS — R29818 Other symptoms and signs involving the nervous system: Secondary | ICD-10-CM | POA: Diagnosis present

## 2017-03-11 DIAGNOSIS — R201 Hypoesthesia of skin: Secondary | ICD-10-CM | POA: Insufficient documentation

## 2017-03-11 DIAGNOSIS — Z7901 Long term (current) use of anticoagulants: Secondary | ICD-10-CM | POA: Diagnosis not present

## 2017-03-11 DIAGNOSIS — Z7982 Long term (current) use of aspirin: Secondary | ICD-10-CM

## 2017-03-11 DIAGNOSIS — Z79899 Other long term (current) drug therapy: Secondary | ICD-10-CM | POA: Diagnosis not present

## 2017-03-11 DIAGNOSIS — R002 Palpitations: Secondary | ICD-10-CM

## 2017-03-11 HISTORY — DX: Type 2 diabetes mellitus without complications: E11.9

## 2017-03-11 HISTORY — DX: Calculus of gallbladder without cholecystitis without obstruction: K80.20

## 2017-03-11 HISTORY — DX: Gestational diabetes mellitus in pregnancy, unspecified control: O24.419

## 2017-03-11 HISTORY — DX: Headache, unspecified: R51.9

## 2017-03-11 HISTORY — DX: Headache: R51

## 2017-03-11 LAB — LIPID PANEL
Cholesterol: 102 mg/dL (ref 0–200)
HDL: 53 mg/dL
LDL Cholesterol: 31 mg/dL (ref 0–99)
Total CHOL/HDL Ratio: 1.9 ratio
Triglycerides: 91 mg/dL
VLDL: 18 mg/dL (ref 0–40)

## 2017-03-11 LAB — TROPONIN I: Troponin I: <0.03 ng/mL (ref <0.03)

## 2017-03-11 LAB — CBC WITH DIFFERENTIAL/PLATELET
Basophils Absolute: 0 10*3/uL (ref 0.0–0.1)
Basophils Relative: 0 %
EOS ABS: 0 10*3/uL (ref 0.0–0.7)
EOS PCT: 0 %
HCT: 36.6 % (ref 36.0–46.0)
Hemoglobin: 12 g/dL (ref 12.0–15.0)
Lymphocytes Relative: 44 %
Lymphs Abs: 4.2 10*3/uL — ABNORMAL HIGH (ref 0.7–4.0)
MCH: 25.9 pg — AB (ref 26.0–34.0)
MCHC: 32.8 g/dL (ref 30.0–36.0)
MCV: 79 fL (ref 78.0–100.0)
MONOS PCT: 5 %
Monocytes Absolute: 0.4 10*3/uL (ref 0.1–1.0)
Neutro Abs: 4.9 10*3/uL (ref 1.7–7.7)
Neutrophils Relative %: 51 %
PLATELETS: 294 10*3/uL (ref 150–400)
RBC: 4.63 MIL/uL (ref 3.87–5.11)
RDW: 13.4 % (ref 11.5–15.5)
WBC: 9.6 10*3/uL (ref 4.0–10.5)

## 2017-03-11 LAB — COMPREHENSIVE METABOLIC PANEL
ALK PHOS: 90 U/L (ref 38–126)
ALT: 23 U/L (ref 14–54)
AST: 27 U/L (ref 15–41)
Albumin: 4 g/dL (ref 3.5–5.0)
Anion gap: 16 — ABNORMAL HIGH (ref 5–15)
BUN: 6 mg/dL (ref 6–20)
CHLORIDE: 96 mmol/L — AB (ref 101–111)
CO2: 25 mmol/L (ref 22–32)
CREATININE: 0.79 mg/dL (ref 0.44–1.00)
Calcium: 9.5 mg/dL (ref 8.9–10.3)
GFR calc Af Amer: >60 mL/min (ref >60)
GFR calc non Af Amer: >60 mL/min (ref >60)
Glucose, Bld: 185 mg/dL — ABNORMAL HIGH (ref 65–99)
Potassium: 3.3 mmol/L — ABNORMAL LOW (ref 3.5–5.1)
SODIUM: 137 mmol/L (ref 135–145)
Total Bilirubin: 0.6 mg/dL (ref 0.3–1.2)
Total Protein: 8.3 g/dL — ABNORMAL HIGH (ref 6.5–8.1)

## 2017-03-11 LAB — GLUCOSE, CAPILLARY: Glucose-Capillary: 228 mg/dL — ABNORMAL HIGH (ref 65–99)

## 2017-03-11 LAB — TSH: TSH: 1.116 u[IU]/mL (ref 0.350–4.500)

## 2017-03-11 MED ORDER — INSULIN ASPART 100 UNIT/ML ~~LOC~~ SOLN
0.0000 [IU] | Freq: Three times a day (TID) | SUBCUTANEOUS | Status: DC
  Administered 2017-03-12 (×2): 2 [IU] via SUBCUTANEOUS

## 2017-03-11 MED ORDER — ASPIRIN EC 81 MG PO TBEC
81.0000 mg | DELAYED_RELEASE_TABLET | Freq: Every day | ORAL | Status: DC
  Administered 2017-03-11 – 2017-03-12 (×2): 81 mg via ORAL
  Filled 2017-03-11 (×2): qty 1

## 2017-03-11 MED ORDER — RIVAROXABAN 20 MG PO TABS
20.0000 mg | ORAL_TABLET | Freq: Every day | ORAL | Status: DC
  Filled 2017-03-11: qty 1

## 2017-03-11 NOTE — H&P (Signed)
Date: 03/11/2017               Patient Name:  [name] MRN: [id number]  DOB: 1970-09-09 Age / Sex: 47 y.o., female   PCP: Burna Cash, MD              Medical Service: Internal Medicine Teaching Service              Attending Physician: Dr. Levert Feinstein, MD    First Contact: Dr. Lorenso Courier Pager: [phone]  Second Contact: Dr. Noemi Chapel Pager: [phone]  Third Contact Dr. Riley Churches Pager:        After Hours (After 5p/  First Contact Pager: [phone]  weekends / holidays): Second Contact Pager: [phone]   Chief Complaint: "Left-sided numbness and left-sided pain starting yesterday"  History of Present Illness:  [name] is a 47 year old female with a past medical history of non-insulin dependent Type 2 DM, prolonged QT, paroxysmal atrial fibrillation, and HTN who presented to clinic due to left-sided facial pain and numbness that started one day ago.   Starting Wednesday morning, the patient began having acute-onset left ear pain. She then noticed some facial numbness that was worse with chewing. She also noticed decreased taste sensation on the left side of her tongue. She describes the facial/ear pain as "stabbing." She reported taking some ibuprofen but this provided no relief. She's never experienced these symptoms before. She denied any changes in vision, hearing loss, headaches, or focal neurologic deficits. She denies any fevers or respiratory symptoms. The patient reported having chronic intermittent chest pain for the past five years that has increased in frequency recently.   The patient Type 2 DM is controlled with Metformin 1000 mg BID. The patient's hypertension is controlled with 5 mg Amlodipine daily and Zestoretic 20-12.5 mg daily. The patient also takes Aspirin 81 mg daily and Rivaroxaban 20 mg daily for stroke prevention due to her paroxysmal atrial fibrillation. Patient reports that she is compliant with these medications. Patient  reports that she is scheduled to have gallbladder screen next week on 3/13 at [facility].   Meds: No current facility-administered medications for this encounter.    Current Outpatient Medications  Medication Sig Dispense Refill  . amLODipine (NORVASC) 5 MG tablet Take 1 tablet (5 mg total) by mouth daily. 30 tablet 5  . aspirin EC 81 MG tablet Take 81 mg by mouth daily.    Marland Kitchen BAYER MICROLET LANCETS lancets Check blood sugar two times a day 100 each 12  . glucose blood test strip Use to check blood sugar up to 2 times a day 100 each 12  . glucose blood test strip Use to check blood sugar up tp 2 times a day. Diagnosis code E11.8 100 each 12  . lisinopril-hydrochlorothiazide (ZESTORETIC) 20-12.5 MG tablet Take 2 tablets by mouth daily. 180 tablet 0  . metFORMIN (GLUCOPHAGE) 1000 MG tablet Take 1 tablet (1,000 mg total) by mouth 2 (two) times daily with a meal. 60 tablet 5  . ONETOUCH DELICA LANCETS FINE MISC Use to check blood sugar up to 2 times a day. Diagnosis code E11.8 100 each 12  . rivaroxaban (XARELTO) 20 MG TABS tablet Take 1 tablet (20 mg total) by mouth daily with supper. 30 tablet 11    Allergies: Allergies as of 03/11/2017  . (No Known Allergies)   Past Medical History:  Diagnosis Date  . Diabetes mellitus    a. Gestational Diabetes with both children -> progressed  to type 2 DM. b. 05/2012 A1c 12.6.  . History of prolonged Q-T interval on ECG    a. 09/2012 adm: QTC 506.  Marland Kitchen. Hx gestational diabetes   . Hypertension   . PAF (paroxysmal atrial fibrillation) ([facility]CC) 2012   a. 07/2011 ED visit with AF-> converted with flecainide, never followed-up;  b. 05/2012 Recurrent Afib -echo: EF 65-60%, mildly dil LA. Placed on Xarelto/daily Flecainide. c. 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  . Small bowel obstruction (HCC) 09/2010   resolved   Past Surgical History:  Procedure Laterality Date  . CESAREAN SECTION    . HERNIA REPAIR    . VENTRAL HERNIA REPAIR  02/05/05   with mesh    Family History  Problem Relation Age of Onset  . Other Father        killed in [place] civil war when he was in his 3150's  . Asthma Mother        alive in her 5560's  . Other Sister        A & W  . Other Sister        A & W  . Heart disease Neg Hx    Social History   Socioeconomic History  . Marital status: Single    Spouse name: Not on file  . Number of children: 2  . Years of education: Not on file  . Highest education level: Not on file  Social Needs  . Financial resource strain: Not on file  . Food insecurity - worry: Not on file  . Food insecurity - inability: Not on file  . Transportation needs - medical: Not on file  . Transportation needs - non-medical: Not on file  Occupational History  . Occupation: HOUSEKEEPING    Employer: [employer]  Tobacco Use  . Smoking status: Never Smoker  . Smokeless tobacco: Never Used  Substance and Sexual Activity  . Alcohol use: No  . Drug use: No  . Sexual activity: Not Currently    Partners: Male  Other Topics Concern  . Not on file  Social History Narrative   ** Merged History Encounter **       ** Data from: 11/07/10 Enc Dept: [facility]   Pt has 6 sisters but doe not know their health history or status.      Mother and father unknown      Pt is an immigrant from [place], [place]           ** Data from: 09/19/13 Enc Dept: [facility]   [place] other.  Works @ [employer] in [employer].  Does not routinely exercise.    Review of Systems: Pertinent items noted in HPI and remainder of comprehensive ROS otherwise negative.  Physical Exam: There were no vitals taken for this visit. General appearance: alert, cooperative, appears stated age and no distress Head: Normocephalic, without obvious abnormality, atraumatic Eyes: conjunctivae/corneas clear. PERRL, EOM's intact. Fundi benign. Ears: normal TM's and external ear canals both ears Throat:  lips, mucosa, and tongue normal; teeth and gums normal Lungs: clear to auscultation bilaterally Heart: regular rate and rhythm, S1, S2 normal, no murmur, click, rub or gallop Extremities: extremities normal, atraumatic, no cyanosis or edema Pulses: 2+ and symmetric Skin: Skin color, texture, turgor normal. No rashes or lesions Neurologic: Alert and oriented X 3, normal strength and tone. Normal symmetric reflexes. Left facial hypothesia noted. Left facial droop with asymmetry noted. Flattening  of the left forehead fold.  Normal sensation and strength in the lower and upper extremities.  Mental status: Alert, oriented, thought content appropriate   Assessment & Plan by Problem:  Everlena Mcnear is a 47 year old female with a past medical history of non-insulin dependent Type 2 DM, prolonged QT, paroxysmal atrial fibrillation, and HTN who presented to clinic due to left-sided facial pain and numbness that started two days ago. Left facial droop and left hypethesia: Patient has a one-day history of left-sided facial droop with acute onset of symptoms. Physical exam showed left facial hypothesia and left facial droop with asymmetry. There was also flattening of the left forehead fold.  Based on physical exam, sensory and motor components of cranial nerve VII appear to be affected. There is possible CN V involvement as well given her decreased facial sensation. The patient had no other focal neurologic deficits and the remainder of her neurologic exam was normal. The patient did not report any recent exposure to tics that would increase clinical suspicion for lyme disease. Patient had a score of 2 on the NIHSS indicating a less than mild stroke.  Urgent brain imaging with CT is mandatory in all patients with sudden neurologic deterioration. Given the acute onset of her neurologic symptoms and her increased stroke risk with a history of PAF and prolonged QT, stroke work-up initiated.    -STAT CT Scan  Pending -Brain MRI Pending -Head & Neck MRA Pending -CBC Pending  Prolonged QT/ Paroxysmal atrial fibrillation: Patient currently prescribed Xarelto 20 mg PO daily for stroke prevention. She has a CHADS2VASC2 score of 3. -EKG pending -Echo pending -Troponin pending -place on continuous cardiac monitoring -continue home xarelto 20mg  qd   Diabetes: Patient currently prescribed Metformin 1000 mg daily PO BID. Patient's most recent A1C in January 2019 was 11.4 indicating poor glycemic control.  Hypertension: Patient's blood pressure is elevated upon visit to clinic (166/97). Patient is currently prescribed Lisinopril-HCTZ 20-12.5 mg BID. Will hold off on starting anti-hypertensive medication until stroke work-up completed.    This is a Psychologist, occupational Note.  The care of the patient was discussed with Dr. Delma Officer and the assessment and plan was formulated with their assistance.  Please see their note for official documentation of the patient encounter.   Signed: Sanjuana Letters, Medical Student 03/11/2017, 5:25 PM

## 2017-03-11 NOTE — Progress Notes (Signed)
   CC: Left-sided facial numbness and pain for past 2 days.  HPI:  [name] is a 47 y.o. with past medical history as listed below came to the clinic with complaint of left-sided facial numbness, droopiness and pain for the past 2 days.  Patient started mild left ear ache on Sunday, now the pain is mostly behind the ear and on the side of the face.  On Tuesday morning she noticed some facial drooping and spillage of water from left side of her face while drinking.  She was also complaining of numbness and altered taste on left side of tongue.  She denies any hearing loss, fever or other upper respiratory symptoms.  Never experienced similar symptoms in the past. She noticed that she is having some weakness while closing left eye.  She denies any recent outdoor exposure or tick bite. She denies any other focal weakness.  She denies any change in vision.  Past Medical History:  Diagnosis Date  . Diabetes mellitus    a. 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 b. 05/2012 A1c 12.6.  . History of prolonged Q-T interval on ECG    a. 09/2012 adm: QTC 506.  Marland Kitchen. Hx gestational diabetes   . Hypertension   . PAF (paroxysmal atrial fibrillation) (HCC) 2012   a. 07/2011 ED visit with AF-> converted with flecainide, never followed-up;  b. 05/2012 Recurrent Afib -echo: EF 65-60%, mildly dil LA. Placed on Xarelto/daily Flecainide. c. 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  . Small bowel obstruction (HCC) 09/2010   resolved   Review of Systems: Negative except mentioned in HPI.  Physical Exam:  Vitals:   03/11/17 1514  BP: (!) 189/89  Pulse: 85  Temp: 98.6 F (37 C)  TempSrc: Oral  SpO2: 98%  Weight: 174 lb 6.4 oz (79.1 kg)  Height: 5\' 6"  (1.676 m)         General: Vital signs reviewed.  Patient is well-developed and well-nourished, in no acute distress and cooperative with exam.  Head: Normocephalic and atraumatic. Eyes: EOMI, conjunctivae normal, no  scleral icterus. Difficulty with left eye closure. Neurological: A&O x3, Strength is normal and symmetric bilaterally, left facial droop with asymmetry, flattening of left forehead fold, difficulty with left eye closure.  Rest of the cranial nerve exam was within normal limit. Cardiovascular: RRR, S1 normal, S2 normal, no murmurs, gallops, or rubs. Pulmonary/Chest: Clear to auscultation bilaterally, no wheezes, rales, or rhonchi. Abdominal: Soft, non-tender, non-distended, BS +, no masses, organomegaly, or guarding present.  Extremities: No lower extremity edema bilaterally,  pulses symmetric and intact bilaterally. No cyanosis or clubbing. Skin: Warm, dry and intact. No rashes or erythema. Psychiatric: Normal mood and affect. speech and behavior is normal. Cognition and memory are normal.  Assessment & Plan:   See Encounters Tab for problem based charting.  Patient seen with Dr. Oswaldo DoneVincent.

## 2017-03-11 NOTE — Assessment & Plan Note (Signed)
Currently sinus rhythm.  Not on any nodal blockers. Patient is on Xarelto.

## 2017-03-11 NOTE — H&P (Addendum)
Date: 03/11/2017               [name] Name:  [name] MRN: [id number]  DOB: [birth year] Age / Sex: 47 y.o., female   PCP: Burna Cash, MD         Medical Service: Internal Medicine Teaching Service         Attending Physician: Dr. Levert Feinstein, MD    First Contact: Dr. Lorenso Courier Pager: [phone]  Second Contact: Dr. Noemi Chapel Pager: [phone]       After Hours (After 5p/  First Contact Pager: [phone]  weekends / holidays): Second Contact Pager: [phone]   Chief Complaint: Left facial droop and left cervical pain  History of Present Illness:   [name] is a 47 y.o female with non-insulin dependent diabetes mellitus, essential hypertension, paroxysmal atrial fibrillation, history of sbo in 2012, and history of prolonged QT who presented with left sided facial droop and left facial hypoathesia that started yesterday. [name] reported that she was not able to swallow her medication this morning and had to spit them out.   [name] stated that she has been having a headache since Sunday 03/07/17 that was present in Sarah frontal and posterior head. She also has accompanied left ear and neck pain that was 9/10 intensity, stabbing/sharp in nature, constant, and same level of intensity since it began. [name] has not had any hearing changes.  [name] denies any shortness of breath, nausea/vomiting, numbness, or weakness in Sarah body. She did state that she has been having chronic substernal chest pain for Sarah past 5 years that has been getting worse recently. She does feel like her heart skips a beat occasionally.   Clinic: Hypertensive (161-180/82-97), normal pulse, afebrile, normal saturation  Meds:  No outpatient medications have been marked as taking for Sarah 03/11/17 encounter [facility] Encounter).     Allergies: Allergies as of 03/11/2017  . (No Known Allergies)   Past Medical History:  Diagnosis Date  . Diabetes mellitus    a.  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 b. 05/2012 A1c 12.6.  . History of prolonged Q-T interval on ECG    a. 09/2012 adm: QTC 506.  Marland Kitchen Hx gestational diabetes   . Hypertension   . PAF (paroxysmal atrial fibrillation) (HCC) 2012   a. 07/2011 ED visit with AF-> converted with flecainide, never followed-up;  b. 05/2012 Recurrent Afib -echo: EF 65-60%, mildly dil LA. Placed on Xarelto/daily Flecainide. c. 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  . Small bowel obstruction (HCC) 09/2010   resolved    Family History:  Family History  Problem Relation Age of Onset  . Other Father        killed in [place] civil war when he was in his 15's  . Asthma Mother        alive in her 25's  . Other Sister        A & W  . Other Sister        A & W  . Heart disease Neg Hx    Social History:   Social History   Socioeconomic History  . Marital status: Single    Spouse name: Not on file  . Number of children: 2  . Years of education: Not on file  . Highest education level: Not on file  Social Needs  . Financial resource strain: Not on file  . Food insecurity - worry:  Not on file  . Food insecurity - inability: Not on file  . Transportation needs - medical: Not on file  . Transportation needs - non-medical: Not on file  Occupational History  . Occupation: HOUSEKEEPING    Employer: [employer]  Tobacco Use  . Smoking status: Never Smoker  . Smokeless tobacco: Never Used  Substance and Sexual Activity  . Alcohol use: No  . Drug use: No  . Sexual activity: Not Currently    Partners: Male  Other Topics Concern  . Not on file  Social History Narrative   ** Merged History Encounter **       ** Data from: 11/07/10 Enc Dept: [facility]   Pt has 6 sisters but doe not know their health history or status.      Mother and father unknown      Pt is an immigrant from [place], lives in [place], [place] by herself           ** Data from: 09/19/13 Enc Dept:  [facility]   Lives in [place] with significant other.  Works @ [employer] in [employer].  Does not routinely exercise.    Review of Systems: A complete ROS was negative except as per HPI.   Physical Exam: Blood pressure (!) 166/97, pulse 75, temperature 98.3 F (36.8 C), temperature source Oral, resp. rate 18, SpO2 100 %.  Physical Exam  Constitutional: She is oriented to person, place, and time. She appears well-developed and well-nourished. No distress.  HENT:  Head: Normocephalic and atraumatic.  Good denition, no posterior pharyngeal erythema or exudate noted. No tonsillar swelling. Flattening of left nasolabial fold.  Eyes: Conjunctivae and EOM are normal. Pupils are equal, round, and reactive to light. Right eye exhibits no discharge. Left eye exhibits no discharge.  Neck: No tracheal deviation present.  Cardiovascular: Normal rate, regular rhythm and normal heart sounds.  Respiratory: Effort normal and breath sounds normal. No respiratory distress. She has no wheezes.  GI: Soft. Bowel sounds are normal. She exhibits no distension. There is tenderness (right upper quadrant pain to palpation).  Musculoskeletal: She exhibits no edema.  5/5 bilateral upper and lower extremity strength  Lymphadenopathy:    She has no cervical adenopathy.  Neurological: She is alert and oriented to person, place, and time. A cranial nerve deficit (left facial droop (CN 7)) is present.  Left facial hypoathesia, bilateral patellar reflexes intact  Skin: Skin is warm. She is not diaphoretic.  Psychiatric: Her speech is normal and behavior is normal. Judgment and thought content normal. Her mood appears anxious. Cognition and memory are normal.         CMP     Component Value Date/Time   NA 143 03/05/2017 1450   K 4.4 03/05/2017 1450   CL 100 03/05/2017 1450   CO2 25 03/05/2017 1450   GLUCOSE 127 (H) 03/05/2017 1450   GLUCOSE 232 (H) 12/24/2016 0945   BUN 15 03/05/2017 1450    CREATININE 0.90 03/05/2017 1450   CALCIUM 10.2 03/05/2017 1450   PROT 8.0 12/24/2016 0945   ALBUMIN 3.8 12/24/2016 0945   AST 24 12/24/2016 0945   ALT 24 12/24/2016 0945   ALKPHOS 106 12/24/2016 0945   BILITOT 0.6 12/24/2016 0945   GFRNONAA 77 03/05/2017 1450   GFRAA 89 03/05/2017 1450   CBC    Component Value Date/Time   WBC 7.4 12/24/2016 0945   RBC 4.69 12/24/2016 0945   HGB 12.0 12/24/2016 0945   HCT 36.6 12/24/2016  0945   PLT 260 12/24/2016 0945   MCV 78.0 12/24/2016 0945   MCH 25.6 (L) 12/24/2016 0945   MCHC 32.8 12/24/2016 0945   RDW 13.7 12/24/2016 0945   LYMPHSABS 2.4 05/15/2013 1128   MONOABS 0.6 05/15/2013 1128   EOSABS 0.1 05/15/2013 1128   BASOSABS 0.0 05/15/2013 1128   EKG: pending  CXR: pending  Assessment & Plan by Problem:  [name] is a 47 y.o female with non-insulin dependent diabetes mellitus, essential hypertension, paroxysmal atrial fibrillation who presented with left sided facial droop and left facial hypoathesia that started yesterday. Signs and symptoms are significant for facial palsy workup.  Acute focal neurological deficit Left facial droop and left hypoathesia [name] has had a one day history of left sided neurological deficits that started acutely. Based on [name]'s clinical appearance she seems to have affected both sensory and motor divisions of Sarah facial nerve (CN 7). Since facial sensation was also affected, it is possible that there is some trigeminal nerve (CN 5) involvement.  There is no other weakness or sensory deficits in Sarah body and therefore there is a high suspicion for peripheral causes of facial palsy. [name] has good dentition, is afebrile,and no leukocytosis which makes infectious causes (abscess etc) less likely. [name] does not have tonsillar or parotid gland swelling. She has not been any exposure to ticks that place [name] at risk for lyme disease. Vesicles were not visualized on Sarah face or in  ear.   [name] is at risk for a stroke causing facial nerve deficits due to her uncontrolled diabetes, hypertension, and atrial fibrillation and therefore central causes of facial palsy were also pursued. Stroke workup was started.  -CT head -MRI brain  -MRA head and neck  -TTE -Lipid panel pending  -Troponin pending  -TSH -Need to evaluate bilateral ears in am  Atypical chest pain  [name] has been having a 5 year history of chest pain that she notes has been getting worse. [name]'s pain being constantly present for 5 years makes it highly unlikely that [name]'s chest pain is due to ACS. However, [name] does have significant risk factors (DM and HTN) and therefore will get EKG.   -EKG pending -Chest x-ray pending -Troponin pending -Continue aspirin 81mg  qd  Paroxysmal atrial fibrillation/ QT prolongation [name]'s rate is controlled and therefore she is not on a nodal blocker. She has a CHADS2VASC2 score of 3. She is being prescribed xarelto 20mg  qd.    -EKG pending -place on continuous cardiac monitoring -continue home xarelto 20mg  qd  Essential Hypertension [name]'s blood pressure since admission has been uncontrolled and ranged 166-189/89-97. [name] is on lisinopril-hctz 20-12.5mg   2 tablets daily.   -Pending CT scan result to start a anti-hyperglycemic ratio  Non-insulin Diabetes Mellitus [name]'s last a1c was noted to be 11.4 on 01/15/17. [name] is on metformin 1000mg  bid at home.   -SSI   Dispo: Admit [name] to Inpatient with expected length of stay greater than 2 midnights.  SignedLorenso Courier: Geet Hosking, MD 03/11/2017, 6:21 PM  Pager: [phone]

## 2017-03-11 NOTE — Progress Notes (Signed)
Patient arrived to [id number] from MD office. Safety precautions and orders reviewed with patient/ TELE applied. VSS. MD notified. Pt transport to CT at this time. Will continue to monitor,   Sim BoastHavy, RN

## 2017-03-11 NOTE — Progress Notes (Signed)
Patient SBP greater than 160, contacted provider on call to see if allowing permissive HTN  Was advised that are allowing permissive HTN

## 2017-03-11 NOTE — Assessment & Plan Note (Signed)
Her exam and symptoms were more consistent with Bell's palsy. She has risk factors for stroke being diabetic, hypertensive and on Xarelto with A. Fib.  Patient is being admitted to rule out stroke. If there is no element of stroke she will be treated for Bell's palsy.

## 2017-03-11 NOTE — Assessment & Plan Note (Signed)
BP Readings from Last 3 Encounters:  03/11/17 (!) 180/97  03/05/17 (!) 161/82  02/05/17 (!) 164/85   Pressure was elevated.  Cording to patient she was unable to swallow completely her medication this morning and did spit out some of them.  We can readjust her antihypertensives after this hospitalization.

## 2017-03-11 NOTE — Assessment & Plan Note (Signed)
>>  ASSESSMENT AND PLAN FOR ESSENTIAL HYPERTENSION WRITTEN ON 03/11/2017  5:00 PM BY AMIN, SUMAYYA, MD  BP Readings from Last 3 Encounters:  03/11/17 (!) 180/97  03/05/17 (!) 161/82  02/05/17 (!) 164/85   Pressure was elevated.  Cording to patient she was unable to swallow completely her medication this morning and did spit out some of them.  We can readjust her antihypertensives after this hospitalization.

## 2017-03-12 ENCOUNTER — Observation Stay (HOSPITAL_BASED_OUTPATIENT_CLINIC_OR_DEPARTMENT_OTHER): Payer: BC Managed Care – PPO

## 2017-03-12 ENCOUNTER — Other Ambulatory Visit: Payer: Self-pay

## 2017-03-12 ENCOUNTER — Encounter (HOSPITAL_COMMUNITY): Payer: Self-pay

## 2017-03-12 DIAGNOSIS — R201 Hypoesthesia of skin: Secondary | ICD-10-CM | POA: Diagnosis not present

## 2017-03-12 DIAGNOSIS — R011 Cardiac murmur, unspecified: Secondary | ICD-10-CM | POA: Diagnosis not present

## 2017-03-12 DIAGNOSIS — E119 Type 2 diabetes mellitus without complications: Secondary | ICD-10-CM | POA: Diagnosis not present

## 2017-03-12 DIAGNOSIS — I10 Essential (primary) hypertension: Secondary | ICD-10-CM

## 2017-03-12 DIAGNOSIS — G510 Bell's palsy: Secondary | ICD-10-CM | POA: Diagnosis not present

## 2017-03-12 LAB — CBC
HEMATOCRIT: 40.1 % (ref 36.0–46.0)
HEMOGLOBIN: 12.8 g/dL (ref 12.0–15.0)
MCH: 25.5 pg — ABNORMAL LOW (ref 26.0–34.0)
MCHC: 31.9 g/dL (ref 30.0–36.0)
MCV: 80 fL (ref 78.0–100.0)
Platelets: 306 10*3/uL (ref 150–400)
RBC: 5.01 MIL/uL (ref 3.87–5.11)
RDW: 13.9 % (ref 11.5–15.5)
WBC: 8.6 10*3/uL (ref 4.0–10.5)

## 2017-03-12 LAB — TROPONIN I: Troponin I: <0.03 ng/mL (ref <0.03)

## 2017-03-12 LAB — GLUCOSE, CAPILLARY
GLUCOSE-CAPILLARY: 171 mg/dL — AB (ref 65–99)
GLUCOSE-CAPILLARY: 189 mg/dL — AB (ref 65–99)
Glucose-Capillary: 165 mg/dL — ABNORMAL HIGH (ref 65–99)
Glucose-Capillary: 232 mg/dL — ABNORMAL HIGH (ref 65–99)

## 2017-03-12 LAB — ECHOCARDIOGRAM COMPLETE
HEIGHTINCHES: 66 in
WEIGHTICAEL: 2790.4 [oz_av]

## 2017-03-12 LAB — COMPREHENSIVE METABOLIC PANEL
ALBUMIN: 3.9 g/dL (ref 3.5–5.0)
ALK PHOS: 90 U/L (ref 38–126)
ALT: 22 U/L (ref 14–54)
ANION GAP: 11 (ref 5–15)
AST: 23 U/L (ref 15–41)
BILIRUBIN TOTAL: 0.7 mg/dL (ref 0.3–1.2)
BUN: 7 mg/dL (ref 6–20)
CALCIUM: 9.3 mg/dL (ref 8.9–10.3)
CO2: 27 mmol/L (ref 22–32)
Chloride: 99 mmol/L — ABNORMAL LOW (ref 101–111)
Creatinine, Ser: 0.86 mg/dL (ref 0.44–1.00)
GFR calc Af Amer: >60 mL/min (ref >60)
GLUCOSE: 191 mg/dL — AB (ref 65–99)
POTASSIUM: 3.8 mmol/L (ref 3.5–5.1)
Sodium: 137 mmol/L (ref 135–145)
TOTAL PROTEIN: 8.4 g/dL — AB (ref 6.5–8.1)

## 2017-03-12 LAB — HIV ANTIBODY (ROUTINE TESTING W REFLEX): HIV SCREEN 4TH GENERATION: NONREACTIVE

## 2017-03-12 MED ORDER — HYPROMELLOSE (GONIOSCOPIC) 2.5 % OP SOLN
1.0000 [drp] | Freq: Three times a day (TID) | OPHTHALMIC | Status: DC
  Filled 2017-03-12: qty 15

## 2017-03-12 MED ORDER — PREDNISONE 10 MG PO TABS: ORAL_TABLET | ORAL | 0 refills | Status: AC

## 2017-03-12 MED ORDER — HYDROCHLOROTHIAZIDE 12.5 MG PO CAPS
12.5000 mg | ORAL_CAPSULE | Freq: Every day | ORAL | Status: DC
Start: 2017-03-12 — End: 2017-03-12
  Administered 2017-03-12: 12.5 mg via ORAL
  Filled 2017-03-12: qty 1

## 2017-03-12 MED ORDER — PREDNISOLONE 5 MG PO TABS
25.0000 mg | ORAL_TABLET | Freq: Two times a day (BID) | ORAL | Status: DC
  Filled 2017-03-12 (×2): qty 5

## 2017-03-12 MED ORDER — LISINOPRIL 20 MG PO TABS
20.0000 mg | ORAL_TABLET | Freq: Every day | ORAL | Status: DC
  Administered 2017-03-12: 20 mg via ORAL
  Filled 2017-03-12: qty 1

## 2017-03-12 MED ORDER — AMLODIPINE BESYLATE 5 MG PO TABS
5.0000 mg | ORAL_TABLET | Freq: Every day | ORAL | Status: DC
  Administered 2017-03-12: 5 mg via ORAL
  Filled 2017-03-12: qty 1

## 2017-03-12 MED ORDER — INFLUENZA VAC SPLIT QUAD 0.5 ML IM SUSY: 0.5000 mL | PREFILLED_SYRINGE | INTRAMUSCULAR | Status: DC

## 2017-03-12 MED ORDER — ENSURE ENLIVE PO LIQD: 237.0000 mL | Freq: Two times a day (BID) | ORAL | Status: DC

## 2017-03-12 MED ORDER — ACETAMINOPHEN 325 MG PO TABS
650.0000 mg | ORAL_TABLET | Freq: Four times a day (QID) | ORAL | Status: DC | PRN
  Administered 2017-03-12: 650 mg via ORAL
  Filled 2017-03-12: qty 2

## 2017-03-12 MED ORDER — LISINOPRIL-HYDROCHLOROTHIAZIDE 20-12.5 MG PO TABS: 2.0000 | ORAL_TABLET | Freq: Every day | ORAL | Status: DC

## 2017-03-12 NOTE — Progress Notes (Signed)
I examined this patient today together with resident physician Dr. Lorenso CourierVahini Chundi and I concur with her evaluation and management plan which we discussed together. She has typical symptoms and physical findings compatible with Bell's palsy.  No evidence from cranial imaging that she has had a stroke or hemorrhage.  She is on chronic anticoagulation for paroxysmal atrial fibrillation. We will treat with a short course of prednisone.  She is otherwise stable for discharge today.

## 2017-03-12 NOTE — Progress Notes (Signed)
  Echocardiogram 2D Echocardiogram has been performed.  Sarah SavoyCasey N Willis [name] 03/12/2017, 9:47 AM

## 2017-03-12 NOTE — Progress Notes (Signed)
   Subjective: [name] was seen resting in her bed this morning. She was noted to still have left facial droop and she continues to have left cervical neck pain.   Objective:  Vital signs in last 24 hours: Vitals:   03/11/17 2200 03/12/17 0200 03/12/17 0400 03/12/17 0801  BP: (!) 167/88 (!) 149/89 130/79 (!) 142/89  Pulse:  73 78 85  Resp: 18 18 18 18   Temp: 98.3 F (36.8 C)  98.5 F (36.9 C) 98.6 F (37 C)  TempSrc: Oral  Oral Oral  SpO2: 98% 99% 100% 98%   Physical Exam  Constitutional: She appears well-developed and well-nourished. No distress.  HENT:  Head: Normocephalic and atraumatic.  No vesicles noted on face, tongue, or ears  Eyes: Conjunctivae are normal.  Cardiovascular: Normal rate, regular rhythm and normal heart sounds.  Respiratory: Effort normal and breath sounds normal. No respiratory distress. She has no wheezes.  GI: Soft. Bowel sounds are normal. She exhibits no distension. There is no tenderness.  Musculoskeletal: She exhibits no edema.  Neurological: She is alert. A cranial nerve deficit (continues to have left sided facial droop. eyebrow raise was symmetric. ) is present.  Skin: Skin is dry. She is not diaphoretic.  Psychiatric: She has a normal mood and affect. Her behavior is normal. Judgment and thought content normal.   Assessment/Plan: [name] is a 47 y.o female with non-insulin dependent diabetes mellitus, essential hypertension, paroxysmal atrial fibrillation who presented with left sided facial droop and left facial hypoathesia that started yesterday. Signs and symptoms are significant for facial palsy workup.  Left facial palsy and left facial hypoathesia As suspected the patient's CT head, MRI brain, and MRA head and neck all returned negative for any acute intracranial abnormalities.   Peripheral causes of facial palsy include ramsay hunt syndrome, abscess, viral infections, etc. The patient continues to not have any facial vesicles, no  hearing deficits, no dizziness.   -Will not give acyclovir at this time as do not see clear signs of vzv -Plan to give prednisone -Tylenol q6hrs prn for pain  -Lipid panel: choles=102, trig=91, hdl=53, ldl=31 -TSH=1.116 -Isopto tears 1 drop tid to prevent corneal compromise. Will discharge with this.  Atypical chest pain  The patient's troponin was negative. EKG is pending.  -continue aspirin 81mg  qd  Paroxysmal atrial fibrillation/QT prolongation The patient continues to be in normal sinus rhythm.  -Continue xarelto 20mg  qd  -Continue telemetry   Essential Hypertension  The patient's blood pressure over the past 24 hrs has ranged 130-189/79-89.   -Continue lisinopril-hctz 20-12.5mg  daily.  -Continue amlodipine 5mg  qd  Non insulin dependent Diabetes Mellitus The patient's blood glucose has ranged 171-228 over the past 24 hrs.   -Continue SSI- discharge with metformin  Dispo: Anticipated discharge today.   Lorenso Courierhundi, Estalee Mccandlish, MD 03/12/2017, 8:51 AM Pager: [phone]

## 2017-03-12 NOTE — Progress Notes (Signed)
Internal Medicine Clinic Attending  I saw and evaluated the patient.  I personally confirmed the key portions of the history and exam documented by Dr. Nelson ChimesAmin and I reviewed pertinent patient test results.  The assessment, diagnosis, and plan were formulated together and I agree with the documentation in the resident's note.  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 But, she has several risk factors for CVA including A fib and uncontrolled HTN on anticoagulation. So I will admit for overnight observation and MRI brain to rule out CNS event.

## 2017-03-12 NOTE — Discharge Instructions (Addendum)
It was a pleasure to take care of you [name]. Please do the following:  -Please take prednisone as instructed  Prednisone 60mg  for 5 days Prednisone 50mg  for 1 day Prednisone 40mg  for 1 day Prednisone 30mg  for 1 day Prednisone 20mg  for 1 day Prednisone 10mg  for 1 day -Please use sustane eye drops (buy the ones that do not have preservatives in it) -Please follow up in 1 week in the internal medicine clinic  =============================================================================  Information on my medicine - XARELTO (Rivaroxaban)  Why was Xarelto prescribed for you? Xarelto was prescribed for you to reduce the risk of a blood clot forming that can cause a stroke if you have a medical condition called atrial fibrillation (a type of irregular heartbeat).  What do you need to know about xarelto ? Take your Xarelto ONCE DAILY at the same time every day with your evening meal. If you have difficulty swallowing the tablet whole, you may crush it and mix in applesauce just prior to taking your dose.  Take Xarelto exactly as prescribed by your doctor and DO NOT stop taking Xarelto without talking to the doctor who prescribed the medication.  Stopping without other stroke prevention medication to take the place of Xarelto may increase your risk of developing a clot that causes a stroke.  Refill your prescription before you run out.  After discharge, you should have regular check-up appointments with your healthcare provider that is prescribing your Xarelto.  In the future your dose may need to be changed if your kidney function or weight changes by a significant amount.  What do you do if you miss a dose? If you are taking Xarelto ONCE DAILY and you miss a dose, take it as soon as you remember on the same day then continue your regularly scheduled once daily regimen the next day. Do not take two doses of Xarelto at the same time or on the same day.   Important Safety  Information A possible side effect of Xarelto is bleeding. You should call your healthcare provider right away if you experience any of the following: ? Bleeding from an injury or your nose that does not stop. ? Unusual colored urine (red or dark brown) or unusual colored stools (red or black). ? Unusual bruising for unknown reasons. ? A serious fall or if you hit your head (even if there is no bleeding).  Some medicines may interact with Xarelto and might increase your risk of bleeding while on Xarelto. To help avoid this, consult your healthcare provider or pharmacist prior to using any new prescription or non-prescription medications, including herbals, vitamins, non-steroidal anti-inflammatory drugs (NSAIDs) and supplements.  This website has more information on Xarelto: VisitDestination.com.[email or web address].xarelto.com.

## 2017-03-12 NOTE — Progress Notes (Signed)
PT Cancellation Note  Patient Details Name: [name] MRN: [id number] DOB: 11-23-1970   Cancelled Treatment:    Reason Eval/Treat Not Completed: PT screened, no needs identified, will sign off  Per OT, pt functioning at baseline and has no issues with mobility/balance at this time. Spoke with pt as well who agrees. PT screened out. Please re-consult if anything changes. Discharge from PT. Thanks.  Blake DivineShauna A Jenah Vanasten 03/12/2017, 12:53 PM Mylo RedShauna Justan Gaede, PT, DPT [phone]

## 2017-03-12 NOTE — Evaluation (Signed)
Occupational Therapy Evaluation Patient Details Name: [name] MRN: [id number] DOB: January 06, 1970 Today's Date: 03/12/2017    History of Present Illness Pt is a 47 y.o female with non-insulin dependent diabetes mellitus, essential hypertension, paroxysmal atrial fibrillation who presented with left sided facial droop and left facial hypoathesia that started 3/6.    Clinical Impression   This 47 y/o F presents with the above. At baseline Pt is independent with ADLs and functional mobility. Pt completing room and hallway functional mobility without AD, seated and standing ADLs with supervision throughout this session. Pt continues to have numbness/weakness on L side of face, otherwise appears to be at her baseline regarding ADL and functional mobility completion. Feel pt is safe to return home from OT standpoint once medically ready. No further acute OT needs identified at this time. Will sign off.     Follow Up Recommendations  No OT follow up;Supervision - Intermittent    Equipment Recommendations  None recommended by OT           Precautions / Restrictions Precautions Precautions: None Restrictions Weight Bearing Restrictions: No      Mobility Bed Mobility Overal bed mobility: Modified Independent                Transfers Overall transfer level: Modified independent                    Balance Overall balance assessment: No apparent balance deficits (not formally assessed)                                         ADL either performed or assessed with clinical judgement   ADL Overall ADL's : At baseline                                       General ADL Comments: pt donning socks seated EOB without difficulty, completing room and hallway functional mobility without AD and with supervision throughout. Pt appears to be at her baseline regarding ADL completion nd functional mobility     Vision Baseline Vision/History: No  visual deficits Patient Visual Report: Other (comment)("watering" of L eye ) Vision Assessment?: Yes Eye Alignment: Within Functional Limits Ocular Range of Motion: Within Functional Limits Tracking/Visual Pursuits: Decreased smoothness of eye movement to LEFT superior field Convergence: Within functional limits Visual Fields: No apparent deficits                Pertinent Vitals/Pain Pain Assessment: No/denies pain          Extremity/Trunk Assessment Upper Extremity Assessment Upper Extremity Assessment: Overall WFL for tasks assessed   Lower Extremity Assessment Lower Extremity Assessment: Overall WFL for tasks assessed   Cervical / Trunk Assessment Cervical / Trunk Assessment: Normal   Communication Communication Communication: No difficulties   Cognition Arousal/Alertness: Awake/alert Behavior During Therapy: WFL for tasks assessed/performed Overall Cognitive Status: Within Functional Limits for tasks assessed                                                      Home Living Family/patient expects to be discharged to:: Private residence Living Arrangements: Children(2 teenage children)  Available Help at Discharge: Family Type of Home: Apartment Home Access: Level entry     Home Layout: One level     Bathroom Shower/Tub: Chief Strategy OfficerTub/shower unit   Bathroom Toilet: Standard     Home Equipment: None          Prior Functioning/Environment Level of Independence: Independent        Comments: pt currently driving, completing iADLs, working with housekeeping at [employer]        OT Problem List: Impaired sensation            OT Goals(Current goals can be found in the care plan section) Acute Rehab OT Goals Patient Stated Goal: return home; regain feeling/movement in L side of face  OT Goal Formulation: All assessment and education complete, DC therapy                                 AM-PAC PT "6 Clicks" Daily Activity      Outcome Measure Help from another person eating meals?: None Help from another person taking care of personal grooming?: None Help from another person toileting, which includes using toliet, bedpan, or urinal?: None Help from another person bathing (including washing, rinsing, drying)?: None Help from another person to put on and taking off regular upper body clothing?: None Help from another person to put on and taking off regular lower body clothing?: None 6 Click Score: 24   End of Session Equipment Utilized During Treatment: Gait belt Nurse Communication: Mobility status  Activity Tolerance: Patient tolerated treatment well Patient left: in chair;with call bell/phone within reach  OT Visit Diagnosis: Other symptoms and signs involving the nervous system (R29.898)                Time: 1610-9604: 0832-0852 OT Time Calculation (min): 20 min Charges:  OT General Charges $OT Visit: 1 Visit OT Evaluation $OT Eval Low Complexity: 1 Low G-Codes:     Marcy SirenBreanna Jonatha Gagen, OT Pager [phone] 03/12/2017   Orlando PennerBreanna L Caoimhe Damron 03/12/2017, 9:44 AM

## 2017-03-12 NOTE — Care Management Note (Addendum)
Case Management Note  Patient Details  Name: [name] MRN: [id number] Date of Birth: 1970-05-23  Subjective/Objective:       Pt in with focal neurological deficit. She is from home with her teenage children.              Action/Plan: Pt discharging home with self care. No needs per PT/OT and no DME needs. She has PCP, insurance and transportation home. CM signing off.    Expected Discharge Date:  03/12/17               Expected Discharge Plan:  Home/Self Care  In-House Referral:     Discharge planning Services     Post Acute Care Choice:    Choice offered to:     DME Arranged:    DME Agency:     HH Arranged:    HH Agency:     Status of Service:  Completed, signed off  If discussed at [employer] Length of Stay Meetings, dates discussed:    Additional Comments:  Kermit BaloKelli F Alanah Sakuma, RN 03/12/2017, 2:08 PM

## 2017-03-12 NOTE — Progress Notes (Signed)
Patient discharged home.  Discharge information given. IV and telemetry discontinued. Patient questions asked and answered. Patient transported from unit via wheelchair with staff. [name]

## 2017-03-15 MED FILL — ONE TOUCH DELICA 33G LANCET: 50 days supply | Qty: 100 | Fill #0

## 2017-03-15 MED FILL — predniSONE 10 MG TABS: 10 | 8 days supply | Qty: 45 | Fill #0

## 2017-03-15 NOTE — Discharge Summary (Signed)
Name: [name] MRN: [id number] DOB: [birth year] 47 y.o. PCP: Burna CashSantos-Sanchez, Idalys, MD  Date of Admission: 03/11/2017  5:32 PM Date of Discharge: 03/12/2017 Attending Physician: Dr. Cyndie ChimeGranfortuna  Discharge Diagnosis:  Acute facial nerve palsy Acute focal neurological deficit  Discharge Medications: Allergies as of 03/12/2017   No Known Allergies     Medication List    TAKE these medications   amLODipine 5 MG tablet Commonly known as:  NORVASC Take 1 tablet (5 mg total) by mouth daily.   aspirin EC 81 MG tablet Take 81 mg by mouth daily.   lisinopril-hydrochlorothiazide 20-12.5 MG tablet Commonly known as:  ZESTORETIC Take 2 tablets by mouth daily.   metFORMIN 1000 MG tablet Commonly known as:  GLUCOPHAGE Take 1 tablet (1,000 mg total) by mouth 2 (two) times daily with a meal.   predniSONE 10 MG tablet Commonly known as:  DELTASONE Take 6 tablets (60 mg total) by mouth daily with breakfast for 5 days, THEN 5 tablets (50 mg total) daily with breakfast for 1 day, THEN 4 tablets (40 mg total) daily with breakfast for 1 day, THEN 3 tablets (30 mg total) daily with breakfast for 1 day, THEN 2 tablets (20 mg total) daily with breakfast for 1 day, THEN 1 tablet (10 mg total) daily with breakfast for 1 day. Start taking on:  03/12/2017   rivaroxaban 20 MG Tabs tablet Commonly known as:  XARELTO Take 1 tablet (20 mg total) by mouth daily with supper.       Disposition and follow-up:   [name] [facility].  At the hospital follow up visit please address:  1.  Acute facial palsy: the patient was instructed to take prednisone for total of 10 days- 5 days of 60mg , 1 day 40mg , 1 day 30mg , 1 day 20mg , and 1 day 10 mg.   2.  Labs / imaging needed at time of follow-up: none  3.  Pending labs/ test needing follow-up: none  Follow-up Appointments: Follow-up Information    Burna CashSantos-Sanchez, Idalys, MD Follow up  on 03/16/2017.   Specialty:  Internal Medicine Why:  3:15pm Contact information: [address] [place] [address] [phone]           Hospital Course by problem list:   Acute facial nerve palsy: The patient presented with a 3 day history of headache, left ear pain, left eye ptosis, and left facial droop.The patient had CT head, MRI brain, and MRA head and neck were nevgative for any intracranial abnormalities. The patient's neurological deficits were thought to be due to Bell's palsy vs ramsay hunt syndrome. The patient was found to have more lymphocytes on cbc with differential which suggests a possible viral etiology for the symptoms. The patient was given a 10 day course of prednisone (5 days of 60mg , 1 day 40mg , 1 day 30mg , 1 day 20mg , and 1 day 10 mg). The patient did not have any vesicles in the ear or face. Acyclovir was not given.The patient was instructed to follow up in a week.   Discharge Vitals:   BP 124/79 (BP Location: Left Arm)   Pulse 73   Temp 98.7 F (37.1 C) (Oral)   Resp 18   SpO2 100%   Pertinent Labs, Studies, and Procedures:   BMP Latest Ref Rng & Units 03/12/2017 03/11/2017 03/05/2017  Glucose 65 - 99 mg/dL 213(Y191(H) 865(H185(H) 846(N127(H)  BUN 6 - 20 mg/dL 7 6 15   Creatinine  0.44 - 1.00 mg/dL 1.61 0.96 0.45  BUN/Creat Ratio 9 - 23 - - 17  Sodium 135 - 145 mmol/L 137 137 143  Potassium 3.5 - 5.1 mmol/L 3.8 3.3(L) 4.4  Chloride 101 - 111 mmol/L 99(L) 96(L) 100  CO2 22 - 32 mmol/L 27 25 25   Calcium 8.9 - 10.3 mg/dL 9.3 9.5 40.9   CBC Latest Ref Rng & Units 03/12/2017 03/11/2017 12/24/2016  WBC 4.0 - 10.5 K/uL 8.6 9.6 7.4  Hemoglobin 12.0 - 15.0 g/dL 81.1 91.4 78.2  Hematocrit 36.0 - 46.0 % 40.1 36.6 36.6  Platelets 150 - 400 K/uL 306 294 260   Urinalysis    Component Value Date/Time   COLORURINE STRAW (A) 12/24/2016 0912   APPEARANCEUR CLEAR 12/24/2016 0912   LABSPEC 1.016 12/24/2016 0912   PHURINE 5.0 12/24/2016 0912   GLUCOSEU >=500 (A) 12/24/2016 0912    HGBUR NEGATIVE 12/24/2016 0912   BILIRUBINUR NEGATIVE 12/24/2016 0912   KETONESUR NEGATIVE 12/24/2016 0912   PROTEINUR NEGATIVE 12/24/2016 0912   UROBILINOGEN 0.2 08/30/2013 0152   NITRITE NEGATIVE 12/24/2016 0912   LEUKOCYTESUR SMALL (A) 12/24/2016 0912    MRI brain (03/11/17): 1. Normal MRI of the brain. No acute intracranial process identified. 2. Normal intracranial MRA. 3. Normal MRA of the neck.  CT head (03/11/17): Negative stable ct of head   Chest xray pa and lateal (03/11/17): No active cardiopulmonary disease           Discharge Instructions: Discharge Instructions    Call MD for:  extreme fatigue   Complete by:  As directed    Call MD for:  persistant dizziness or light-headedness   Complete by:  As directed    Call MD for:  persistant nausea and vomiting   Complete by:  As directed    Call MD for:  redness, tenderness, or signs of infection (pain, swelling, redness, odor or green/yellow discharge around incision site)   Complete by:  As directed    Call MD for:  severe uncontrolled pain   Complete by:  As directed    Call MD for:  temperature >100.4   Complete by:  As directed    Diet - low sodium heart healthy   Complete by:  As directed    Increase activity slowly   Complete by:  As directed       Signed: Lorenso Courier, MD 03/15/2017, 2:13 PM   Pager: [phone]

## 2017-03-16 ENCOUNTER — Encounter (HOSPITAL_COMMUNITY): Payer: Self-pay

## 2017-03-16 ENCOUNTER — Ambulatory Visit: Payer: Self-pay

## 2017-03-16 ENCOUNTER — Encounter: Payer: Self-pay | Admitting: Internal Medicine

## 2017-03-16 NOTE — Patient Instructions (Signed)
Your procedure is scheduled on: Tomorrow, March 18, 2017   Surgery Time:  9:00AM-10:00AM   Report to [facility] Main  Entrance    Report to admitting at 7:00 AM   Call this number if you have problems the morning of surgery [phone]   Do not eat food or drink liquids :After Midnight.   Do NOT smoke after Midnight   Complete one Ensure drink the morning of surgery just prior to leaving your house for surgery.   Take these medicines the morning of surgery with A SIP OF WATER: Amlodipine, Prednisone   DO NOT TAKE ANY DIABETIC MEDICATIONS DAY OF YOUR SURGERY                               You may not have any metal on your body including hair pins, jewelry, and body piercings             Do not wear make-up, lotions, powders, perfumes/cologne, or deodorant             Do not wear nail polish.  Do not shave  48 hours prior to surgery.            Do not bring valuables to the hospital. [facility] IS NOT             RESPONSIBLE   FOR VALUABLES.   Contacts, dentures or bridgework may not be worn into surgery.    Patients discharged the day of surgery will not be allowed to drive home.   Special Instructions: Bring a copy of your healthcare power of attorney and living will documents         the day of surgery if you haven't scanned them in before.              Please read over the following fact sheets you were given:  [facility] - Preparing for Surgery Before surgery, you can play an important role.  Because skin is not sterile, your skin needs to be as free of germs as possible.  You can reduce the number of germs on your skin by washing with CHG (chlorahexidine gluconate) soap before surgery.  CHG is an antiseptic cleaner which kills germs and bonds with the skin to continue killing germs even after washing. Please DO NOT use if you have an allergy to CHG or antibacterial soaps.  If your skin becomes reddened/irritated stop using the CHG and inform your nurse when  you arrive at Short Stay. Do not shave (including legs and underarms) for at least 48 hours prior to the first CHG shower.  You may shave your face/neck.  Please follow these instructions carefully:  1.  Shower with CHG Soap the night before surgery and the  morning of surgery.  2.  If you choose to wash your hair, wash your hair first as usual with your normal  shampoo.  3.  After you shampoo, rinse your hair and body thoroughly to remove the shampoo.                             4.  Use CHG as you would any other liquid soap.  You can apply chg directly to the skin and wash.  Gently with a scrungie or clean washcloth.  5.  Apply the CHG Soap to your body ONLY FROM THE NECK DOWN.  Do   not use on face/ open                           Wound or open sores. Avoid contact with eyes, ears mouth and   genitals (private parts).                       Wash face,  Genitals (private parts) with your normal soap.             6.  Wash thoroughly, paying special attention to the area where your    surgery  will be performed.  7.  Thoroughly rinse your body with warm water from the neck down.  8.  DO NOT shower/wash with your normal soap after using and rinsing off the CHG Soap.                9.  Pat yourself dry with a clean towel.            10.  Wear clean pajamas.            11.  Place clean sheets on your bed the night of your first shower and do not  sleep with pets. Day of Surgery : Do not apply any lotions/deodorants the morning of surgery.  Please wear clean clothes to the hospital/surgery center.  FAILURE TO FOLLOW THESE INSTRUCTIONS MAY RESULT IN THE CANCELLATION OF YOUR SURGERY  PATIENT SIGNATURE_________________________________  NURSE SIGNATURE__________________________________  ________________________________________________________________________

## 2017-03-16 NOTE — Pre-Procedure Instructions (Signed)
The following ae in epic: EKG 03/12/17 CBC, CMP 03/12/17 CXR 03/11/17 ECHO 03/12/17

## 2017-03-17 ENCOUNTER — Encounter (HOSPITAL_COMMUNITY)
Admission: RE | Admit: 2017-03-17 | Discharge: 2017-03-17 | Disposition: A | Discharge status: Home / Self Care | Payer: BC Managed Care – PPO | Source: Ambulatory Visit | Attending: General Surgery | Admitting: General Surgery

## 2017-03-17 ENCOUNTER — Other Ambulatory Visit: Payer: Self-pay

## 2017-03-17 ENCOUNTER — Encounter (HOSPITAL_COMMUNITY): Payer: Self-pay

## 2017-03-17 DIAGNOSIS — Z01818 Encounter for other preprocedural examination: Secondary | ICD-10-CM | POA: Insufficient documentation

## 2017-03-17 DIAGNOSIS — K8050 Calculus of bile duct without cholangitis or cholecystitis without obstruction: Secondary | ICD-10-CM | POA: Insufficient documentation

## 2017-03-17 HISTORY — DX: Bell's palsy: G51.0

## 2017-03-17 HISTORY — DX: Personal history of other specified conditions: Z87.898

## 2017-03-17 LAB — HEMOGLOBIN A1C
HEMOGLOBIN A1C: 10.9 % — AB (ref 4.8–5.6)
MEAN PLASMA GLUCOSE: 266.13 mg/dL

## 2017-03-17 LAB — SURGICAL PCR SCREEN
MRSA, PCR: NEGATIVE
Staphylococcus aureus: NEGATIVE

## 2017-03-17 LAB — GLUCOSE, CAPILLARY: Glucose-Capillary: 198 mg/dL — ABNORMAL HIGH (ref 65–99)

## 2017-03-17 NOTE — Pre-Procedure Instructions (Signed)
Spoke with Steward DroneBrenda to make her aware that [name] took her Xarelto 03/17/2017 at 10AM.  She will make Dr. Sheliah HatchKinsinger aware.

## 2017-03-18 ENCOUNTER — Ambulatory Visit (INDEPENDENT_AMBULATORY_CARE_PROVIDER_SITE_OTHER): Payer: BC Managed Care – PPO | Admitting: Internal Medicine

## 2017-03-18 ENCOUNTER — Ambulatory Visit: Payer: Self-pay | Admitting: General Surgery

## 2017-03-18 ENCOUNTER — Other Ambulatory Visit: Payer: Self-pay

## 2017-03-18 ENCOUNTER — Telehealth: Payer: Self-pay | Admitting: *Deleted

## 2017-03-18 DIAGNOSIS — Z7901 Long term (current) use of anticoagulants: Secondary | ICD-10-CM | POA: Diagnosis not present

## 2017-03-18 DIAGNOSIS — I10 Essential (primary) hypertension: Secondary | ICD-10-CM | POA: Diagnosis not present

## 2017-03-18 DIAGNOSIS — G510 Bell's palsy: Secondary | ICD-10-CM

## 2017-03-18 DIAGNOSIS — E118 Type 2 diabetes mellitus with unspecified complications: Secondary | ICD-10-CM

## 2017-03-18 DIAGNOSIS — Z79899 Other long term (current) drug therapy: Secondary | ICD-10-CM

## 2017-03-18 DIAGNOSIS — I4891 Unspecified atrial fibrillation: Secondary | ICD-10-CM

## 2017-03-18 DIAGNOSIS — E119 Type 2 diabetes mellitus without complications: Secondary | ICD-10-CM | POA: Diagnosis not present

## 2017-03-18 MED ORDER — REFRESH P.M. OP OINT: TOPICAL_OINTMENT | OPHTHALMIC | 12 refills | Status: DC | PRN

## 2017-03-18 MED ORDER — PREDNISONE 10 MG PO TABS: 10.0000 mg | ORAL_TABLET | Freq: Every day | ORAL | 0 refills | Status: DC

## 2017-03-18 MED ORDER — PROPYLENE GLYCOL 0.6 % OP SOLN: 1.0000 [drp] | Freq: Four times a day (QID) | OPHTHALMIC | 2 refills | Status: DC

## 2017-03-18 MED ORDER — VALACYCLOVIR HCL 1 G PO TABS: 1000.0000 mg | ORAL_TABLET | Freq: Two times a day (BID) | ORAL | 0 refills | Status: AC

## 2017-03-18 MED ORDER — AMLODIPINE BESYLATE 10 MG PO TABS: 10.0000 mg | ORAL_TABLET | Freq: Every day | ORAL | 5 refills | Status: DC

## 2017-03-18 MED FILL — valACYclovir HCL 1 GM TABS: 1 | 7 days supply | Qty: 14 | Fill #0

## 2017-03-18 MED FILL — AMLODIPINE BESYLATE 10 MG T: 10 | 30 days supply | Qty: 30 | Fill #0

## 2017-03-18 NOTE — Patient Instructions (Addendum)
Thank you for visiting clinic today. Please take your prednisone 6 tablets with food preferably with breakfast each day for 1 week, then take 5 tablet for 1 day followed by 4 tablets next day, then 3 tablets, then 2, and then 1 tablet until you will finish all your medication. Use artificial tear drops 3-4 times daily. I am also giving you an appointment for your eyes, you will use ointment before going to bed each night. Please use some protective goggles to cover your eyes while sleeping. I am giving you another medicine called valacyclovir 1000 mg twice a day for 7 days. Please check your blood sugar more frequently while you are on prednisone as prednisone can increase your blood sugar and watch your diet very carefully. Please follow-up in 2 weeks.

## 2017-03-18 NOTE — Telephone Encounter (Signed)
Please call [facility], [phone], ask for mary ann, r/t prednisone. [facility]

## 2017-03-18 NOTE — Telephone Encounter (Signed)
Pt is here at the clinic- stated she was scheduled for gallbladder surgery by Dr Feliciana Rossettiluke Kinsinger but was canceled b/c she's on Xarelto. But was re-scheduled for next Thursday. Talked to Dr Delma Officerhundi who d/c pt from hospital on the 8th. Stated needs to re-schedule hospital f/u appt anyway and see if she can be seen today. [facility] appt scheduled for today.

## 2017-03-18 NOTE — Progress Notes (Signed)
   CC: Left facial weakness-hospital follow-up.  HPI:  [name] is a 47 y.o. with past medical history as listed below and a recent diagnosis of Bell's palsy came to the clinic for follow-up after her hospital discharge. [facility] March [facility], 2017 until March 12, 2017 to rule out stroke when she presented with sudden onset left facial weakness.  Her stroke workup was negative, and she was discharged home on a tapering dose of prednisone.  Patient never started prednisone, stating that she could not read the instructions on the bottle as it was very fine print.  According to her her left facial numbness has been improved but she continued to have left facial weakness and inability to close left eye.  She continued to have pain behind her left ear and left side of headache.  She continued to have altered taste.  She denies any difficulty with swallowing.  She denies any other focal deficit.  Please see assessment and plan for her chronic conditions.  Past Medical History:  Diagnosis Date  . Bell's palsy 03/2017  . Gallstones   . Gestational diabetes mellitus (GDM) 2005; 2006  . Headache    "monthly" (03/12/2017)  . History of chest pain   . History of prolonged Q-T interval on ECG    a. 09/2012 adm: QTC 506.  Mar[facility] Kitchen. Hypertension   . PAF (paroxysmal atrial fibrillation) (HCC) 2012   a. 07/2011 ED visit with AF-> converted with flecainide, never followed-up;  b. 05/2012 Recurrent Afib -echo: EF 65-60%, mildly dil LA. Placed on Xarelto/daily Flecainide. c. 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  . Small bowel obstruction (HCC) 09/2010   resolved  . Type II diabetes mellitus (HCC)    a. 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 b. 05/2012 A1c 12.6.   Review of Systems: Negative except mentioned in HPI.  Physical Exam:  Vitals:   03/18/17 1343  BP: (!) 148/78  Pulse: 83  Temp: 98.3 F (36.8 C)  TempSrc: Oral  SpO2: 100%    Weight: 173 lb 3.2 oz (78.6 kg)  Height: 5\' 6"  (1.676 m)    General: Vital signs reviewed.  Patient is well-developed and well-nourished, in no acute distress and cooperative with exam.  Head: Normocephalic and atraumatic. Eyes: EOMI, no scleral icterus, difficulty with left eye closure. Cardiovascular: RRR, S1 normal, S2 normal, no murmurs, gallops, or rubs. Pulmonary/Chest: Clear to auscultation bilaterally, no wheezes, rales, or rhonchi. Abdominal: Soft, non-tender, non-distended, BS +, no masses, organomegaly, or guarding present.  Extremities: No lower extremity edema bilaterally,  pulses symmetric and intact bilaterally. No cyanosis or clubbing. Neurological: A&O x3, partial left eye closure, left facial asymmetry with flattening of left nasolabial and forehead fold, left facial droop, sensations intact.  No other focal deficit. Skin: Warm, dry and intact. No rashes or erythema. Psychiatric: Normal mood and affect. speech and behavior is normal. Cognition and memory are normal.  Assessment & Plan:   See Encounters Tab for problem based charting.  Patient discussed with Dr. Josem KaufmannKlima.

## 2017-03-19 ENCOUNTER — Telehealth: Payer: Self-pay | Admitting: Dietician

## 2017-03-19 NOTE — Assessment & Plan Note (Signed)
BP Readings from Last 3 Encounters:  03/18/17 (!) 148/78  03/17/17 (!) 159/89  03/12/17 124/79   Her blood pressure was elevated.  Increase amlodipine from 5-10 mg daily. Continue Zestoretic 20-12.5 mg daily.

## 2017-03-19 NOTE — Assessment & Plan Note (Signed)
>>  ASSESSMENT AND PLAN FOR ESSENTIAL HYPERTENSION WRITTEN ON 03/19/2017 10:04 AM BY AMIN, SUMAYYA, MD  BP Readings from Last 3 Encounters:  03/18/17 (!) 148/78  03/17/17 (!) 159/89  03/12/17 124/79   Her blood pressure was elevated.  Increase amlodipine  from 5-10 mg daily. Continue Zestoretic  20-12.5 mg daily.

## 2017-03-19 NOTE — Telephone Encounter (Signed)
Steroid-Induced Hyperglycemia Prevention and Management [name] is a 47 y.o. female who meets criteria for [facility] glucose [facility] (diabetes patient prescribed short course of steroids).  A/P Current Regimen  Patient prescribed prednisone 60 mg daily x 7 days, 50 x1 day, 40 x 1 day 30 x1 day, 20 x1 day then 10 mg  days, currently on day 1 of therapy. Patient taking prednisone in the AM  Prednisone indication: Bells Palsy  Current DM regimen metformin 100 mg twice daily  Home BG Monitoring  Patient does have a meter at home and does check BG at home. Meter was not supplied.  CBGs at home 180  CBGs prior to steroid course 180, A1C prior to steroid course 10.9  S/Sx of hyper- or hypoglycemia: none, none  Medication Management  Switch prednisone dose to AM not applicable  Additional treatment for BG control is not indicated at this time.  Physician preference level per protocol: 1    Patient Education  Advised patient to monitor BG while on steroid therapy (at least twice daily prior to first 2 meals of the day).  Patient educated about signs/symptoms and advised to contact clinic if hyper- or hypoglycemic.  Patient did  verbalize understanding of information and regimen by repeating back topics discussed.  Follow-up monday phone call to find out what blood sugars are  2 weeks  [name] 11:53 AM 03/19/2017'

## 2017-03-19 NOTE — Assessment & Plan Note (Signed)
She is currently in sinus rhythm.  Continue Xarelto.

## 2017-03-19 NOTE — Assessment & Plan Note (Addendum)
She was having little more disfigurement on her left side, with worsening of left eye closure.  There was some misunderstanding on patient's behalf and she never started her prednisone which was prescribed to her on March 12, 2017.  A new instructions for prednisone 60 mg daily for 1 week with a quick taper was provided. She was given a prescription of valacyclovir 1000 mg twice daily for 1 week. She was given instructions to use artificial tears and protective goggles to protect her left eye. She will follow-up in 2 weeks.

## 2017-03-19 NOTE — Assessment & Plan Note (Signed)
Her A1c done on March 13, 2017 was 10.9-mild improvement from her previous A1c of 11.4 in January 2019.  She was advised to check her blood sugar multiple times while taking steroid and let us know if it stays above 200-she might need some additional help at that time.

## 2017-03-19 NOTE — Telephone Encounter (Signed)
Thank you :)

## 2017-03-22 MED FILL — predniSONE 10 MG TABS: 10 | 15 days supply | Qty: 15 | Fill #0

## 2017-03-22 NOTE — Progress Notes (Signed)
Case discussed with Dr. Amin at the time of the visit.  We reviewed the resident's history and exam and pertinent patient test results.  I agree with the assessment, diagnosis and plan of care documented in the resident's note. 

## 2017-03-25 ENCOUNTER — Ambulatory Visit (HOSPITAL_BASED_OUTPATIENT_CLINIC_OR_DEPARTMENT_OTHER)
Admission: RE | Admit: 2017-03-25 | Discharge: 2017-03-25 | Disposition: A | Discharge status: Home / Self Care | Payer: BC Managed Care – PPO | Source: Ambulatory Visit | Attending: General Surgery | Admitting: General Surgery

## 2017-03-25 ENCOUNTER — Encounter (HOSPITAL_BASED_OUTPATIENT_CLINIC_OR_DEPARTMENT_OTHER): Payer: Self-pay

## 2017-03-25 ENCOUNTER — Ambulatory Visit (HOSPITAL_COMMUNITY): Payer: BC Managed Care – PPO | Admitting: Certified Registered Nurse Anesthetist

## 2017-03-25 ENCOUNTER — Encounter (HOSPITAL_BASED_OUTPATIENT_CLINIC_OR_DEPARTMENT_OTHER)
Admission: RE | Disposition: A | Discharge status: Home / Self Care | Payer: Self-pay | Source: Ambulatory Visit | Attending: General Surgery

## 2017-03-25 ENCOUNTER — Other Ambulatory Visit: Payer: Self-pay

## 2017-03-25 DIAGNOSIS — I4891 Unspecified atrial fibrillation: Secondary | ICD-10-CM

## 2017-03-25 DIAGNOSIS — Z7984 Long term (current) use of oral hypoglycemic drugs: Secondary | ICD-10-CM | POA: Diagnosis not present

## 2017-03-25 DIAGNOSIS — Z79899 Other long term (current) drug therapy: Secondary | ICD-10-CM | POA: Diagnosis not present

## 2017-03-25 DIAGNOSIS — K8010 Calculus of gallbladder with chronic cholecystitis without obstruction: Secondary | ICD-10-CM | POA: Insufficient documentation

## 2017-03-25 DIAGNOSIS — Z7901 Long term (current) use of anticoagulants: Secondary | ICD-10-CM | POA: Insufficient documentation

## 2017-03-25 DIAGNOSIS — Z7982 Long term (current) use of aspirin: Secondary | ICD-10-CM | POA: Diagnosis not present

## 2017-03-25 DIAGNOSIS — G510 Bell's palsy: Secondary | ICD-10-CM | POA: Insufficient documentation

## 2017-03-25 DIAGNOSIS — K811 Chronic cholecystitis: Secondary | ICD-10-CM | POA: Diagnosis present

## 2017-03-25 DIAGNOSIS — E119 Type 2 diabetes mellitus without complications: Secondary | ICD-10-CM | POA: Insufficient documentation

## 2017-03-25 DIAGNOSIS — I10 Essential (primary) hypertension: Secondary | ICD-10-CM | POA: Diagnosis not present

## 2017-03-25 HISTORY — PX: CHOLECYSTECTOMY: SHX55

## 2017-03-25 LAB — POCT PREGNANCY, URINE
PREG TEST UR: NEGATIVE
Preg Test, Ur: NEGATIVE

## 2017-03-25 LAB — GLUCOSE, CAPILLARY
GLUCOSE-CAPILLARY: 261 mg/dL — AB (ref 65–99)
Glucose-Capillary: 308 mg/dL — ABNORMAL HIGH (ref 65–99)

## 2017-03-25 SURGERY — LAPAROSCOPIC CHOLECYSTECTOMY
Anesthesia: General

## 2017-03-25 MED ORDER — ACETAMINOPHEN 500 MG PO TABS
1000.0000 mg | ORAL_TABLET | ORAL | Status: DC
  Filled 2017-03-25: qty 2

## 2017-03-25 MED ORDER — GABAPENTIN 300 MG PO CAPS
ORAL_CAPSULE | ORAL | Status: AC
  Filled 2017-03-25: qty 1

## 2017-03-25 MED ORDER — OXYCODONE HCL 5 MG PO TABS
ORAL_TABLET | ORAL | Status: AC
  Filled 2017-03-25: qty 1

## 2017-03-25 MED ORDER — SUGAMMADEX SODIUM 200 MG/2ML IV SOLN
INTRAVENOUS | Status: AC
  Filled 2017-03-25: qty 2

## 2017-03-25 MED ORDER — ONDANSETRON HCL 4 MG/2ML IJ SOLN
INTRAMUSCULAR | Status: DC | PRN
  Administered 2017-03-25: 4 mg via INTRAVENOUS

## 2017-03-25 MED ORDER — HYDROMORPHONE HCL 1 MG/ML IJ SOLN
0.2500 mg | INTRAMUSCULAR | Status: DC | PRN
  Filled 2017-03-25: qty 0.5

## 2017-03-25 MED ORDER — CHLORHEXIDINE GLUCONATE CLOTH 2 % EX PADS
6.0000 | MEDICATED_PAD | Freq: Once | CUTANEOUS | Status: DC
  Filled 2017-03-25: qty 6

## 2017-03-25 MED ORDER — CEFOTETAN DISODIUM-DEXTROSE 2-2.08 GM-%(50ML) IV SOLR
INTRAVENOUS | Status: AC
  Filled 2017-03-25: qty 50

## 2017-03-25 MED ORDER — SUGAMMADEX SODIUM 200 MG/2ML IV SOLN
INTRAVENOUS | Status: DC | PRN
  Administered 2017-03-25: 200 mg via INTRAVENOUS

## 2017-03-25 MED ORDER — ACETAMINOPHEN 500 MG PO TABS
ORAL_TABLET | ORAL | Status: AC
  Filled 2017-03-25: qty 2

## 2017-03-25 MED ORDER — MIDAZOLAM HCL 5 MG/5ML IJ SOLN
INTRAMUSCULAR | Status: DC | PRN
  Administered 2017-03-25: 2 mg via INTRAVENOUS

## 2017-03-25 MED ORDER — EPHEDRINE 5 MG/ML INJ
INTRAVENOUS | Status: AC
  Filled 2017-03-25: qty 10

## 2017-03-25 MED ORDER — OXYCODONE HCL 5 MG PO TABS
5.0000 mg | ORAL_TABLET | Freq: Once | ORAL | Status: AC
  Administered 2017-03-25: 5 mg via ORAL
  Filled 2017-03-25: qty 1

## 2017-03-25 MED ORDER — ENSURE PRE-SURGERY PO LIQD
296.0000 mL | Freq: Once | ORAL | Status: DC
  Filled 2017-03-25: qty 296

## 2017-03-25 MED ORDER — ACETAMINOPHEN 500 MG PO TABS
1000.0000 mg | ORAL_TABLET | ORAL | Status: AC
  Administered 2017-03-25: 1000 mg via ORAL
  Filled 2017-03-25: qty 2

## 2017-03-25 MED ORDER — MIDAZOLAM HCL 2 MG/2ML IJ SOLN
INTRAMUSCULAR | Status: AC
  Filled 2017-03-25: qty 2

## 2017-03-25 MED ORDER — PROPOFOL 10 MG/ML IV BOLUS
INTRAVENOUS | Status: DC | PRN
  Administered 2017-03-25: 150 mg via INTRAVENOUS

## 2017-03-25 MED ORDER — LIDOCAINE 2% (20 MG/ML) 5 ML SYRINGE
INTRAMUSCULAR | Status: AC
  Filled 2017-03-25: qty 5

## 2017-03-25 MED ORDER — LIDOCAINE 2% (20 MG/ML) 5 ML SYRINGE
INTRAMUSCULAR | Status: DC | PRN
  Administered 2017-03-25: 100 mg via INTRAVENOUS

## 2017-03-25 MED ORDER — ONDANSETRON HCL 4 MG/2ML IJ SOLN
INTRAMUSCULAR | Status: AC
  Filled 2017-03-25: qty 2

## 2017-03-25 MED ORDER — LACTATED RINGERS IV SOLN
INTRAVENOUS | Status: DC
  Administered 2017-03-25 (×2): via INTRAVENOUS
  Filled 2017-03-25: qty 1000

## 2017-03-25 MED ORDER — GABAPENTIN 300 MG PO CAPS
300.0000 mg | ORAL_CAPSULE | ORAL | Status: AC
  Administered 2017-03-25: 300 mg via ORAL
  Filled 2017-03-25: qty 1

## 2017-03-25 MED ORDER — BUPIVACAINE HCL 0.5 % IJ SOLN
INTRAMUSCULAR | Status: DC | PRN
  Administered 2017-03-25: 30 mL

## 2017-03-25 MED ORDER — FENTANYL CITRATE (PF) 100 MCG/2ML IJ SOLN
INTRAMUSCULAR | Status: DC | PRN
  Administered 2017-03-25 (×2): 50 ug via INTRAVENOUS

## 2017-03-25 MED ORDER — GABAPENTIN 300 MG PO CAPS
300.0000 mg | ORAL_CAPSULE | ORAL | Status: DC
  Filled 2017-03-25: qty 1

## 2017-03-25 MED ORDER — ROCURONIUM BROMIDE 10 MG/ML (PF) SYRINGE
PREFILLED_SYRINGE | INTRAVENOUS | Status: AC
  Filled 2017-03-25: qty 5

## 2017-03-25 MED ORDER — IBUPROFEN 800 MG PO TABS: 800.0000 mg | ORAL_TABLET | Freq: Three times a day (TID) | ORAL | 0 refills | Status: DC | PRN

## 2017-03-25 MED ORDER — FENTANYL CITRATE (PF) 100 MCG/2ML IJ SOLN
INTRAMUSCULAR | Status: AC
  Filled 2017-03-25: qty 2

## 2017-03-25 MED ORDER — RIVAROXABAN 20 MG PO TABS: 20.0000 mg | ORAL_TABLET | Freq: Every day | ORAL | 0 refills | Status: DC

## 2017-03-25 MED ORDER — HYDROCODONE-ACETAMINOPHEN 5-325 MG PO TABS: 1.0000 | ORAL_TABLET | Freq: Four times a day (QID) | ORAL | 0 refills | Status: DC | PRN

## 2017-03-25 MED ORDER — SODIUM CHLORIDE 0.9 % IV SOLN
2.0000 g | INTRAVENOUS | Status: DC
  Filled 2017-03-25: qty 2

## 2017-03-25 MED ORDER — ONDANSETRON HCL 4 MG/2ML IJ SOLN
4.0000 mg | Freq: Once | INTRAMUSCULAR | Status: DC | PRN
  Filled 2017-03-25: qty 2

## 2017-03-25 MED ORDER — MEPERIDINE HCL 25 MG/ML IJ SOLN
6.2500 mg | INTRAMUSCULAR | Status: DC | PRN
  Filled 2017-03-25: qty 1

## 2017-03-25 MED ORDER — SODIUM CHLORIDE 0.9 % IV SOLN
2.0000 g | INTRAVENOUS | Status: AC
  Administered 2017-03-25: 2 g via INTRAVENOUS
  Filled 2017-03-25: qty 2

## 2017-03-25 MED ORDER — ROCURONIUM BROMIDE 10 MG/ML (PF) SYRINGE
PREFILLED_SYRINGE | INTRAVENOUS | Status: DC | PRN
  Administered 2017-03-25: 50 mg via INTRAVENOUS

## 2017-03-25 MED ORDER — DEXAMETHASONE SODIUM PHOSPHATE 10 MG/ML IJ SOLN
INTRAMUSCULAR | Status: AC
  Filled 2017-03-25: qty 1

## 2017-03-25 MED ORDER — PHENYLEPHRINE HCL 10 MG/ML IJ SOLN
INTRAMUSCULAR | Status: DC | PRN
  Administered 2017-03-25: 40 ug via INTRAVENOUS

## 2017-03-25 SURGICAL SUPPLY — 45 items
ADH SKN CLS APL DERMABOND .7 (GAUZE/BANDAGES/DRESSINGS) ×1
APPLIER CLIP ROT 10 11.4 M/L (STAPLE)
APR CLP MED LRG 11.4X10 (STAPLE)
BAG SPEC RTRVL 10 TROC 200 (ENDOMECHANICALS) ×1
CABLE HIGH FREQUENCY MONO STRZ (ELECTRODE) ×3 IMPLANT
CATH CHOLANG 76X19 KUMAR (CATHETERS) IMPLANT
CHLORAPREP W/TINT 26ML (MISCELLANEOUS) ×3 IMPLANT
CLIP APPLIE ROT 10 11.4 M/L (STAPLE) IMPLANT
CLIP LIGATING HEM O LOK PURPLE (MISCELLANEOUS) ×5 IMPLANT
CLIP LIGATING HEMO LOK XL GOLD (MISCELLANEOUS) IMPLANT
DECANTER SPIKE VIAL GLASS SM (MISCELLANEOUS) ×3 IMPLANT
DERMABOND ADVANCED (GAUZE/BANDAGES/DRESSINGS) ×2
DERMABOND ADVANCED .7 DNX12 (GAUZE/BANDAGES/DRESSINGS) ×1 IMPLANT
DEVICE PMI PUNCTURE CLOSURE (MISCELLANEOUS) IMPLANT
DRAIN CHANNEL 19F RND (DRAIN) IMPLANT
DRAPE C-ARM 42X72 X-RAY (DRAPES) IMPLANT
DRAPE LAPAROSCOPIC ABDOMINAL (DRAPES) ×3 IMPLANT
ELECT REM PT RETURN 9FT ADLT (ELECTROSURGICAL) ×3
ELECTRODE REM PT RTRN 9FT ADLT (ELECTROSURGICAL) ×1 IMPLANT
EVACUATOR SILICONE 100CC (DRAIN) IMPLANT
GLOVE BIOGEL PI IND STRL 7.0 (GLOVE) ×1 IMPLANT
GLOVE BIOGEL PI INDICATOR 7.0 (GLOVE) ×2
GLOVE SURG SS PI 7.0 STRL IVOR (GLOVE) ×3 IMPLANT
GOWN STRL REUS W/TWL LRG LVL3 (GOWN DISPOSABLE) ×3 IMPLANT
HEMOSTAT SNOW SURGICEL 2X4 (HEMOSTASIS) IMPLANT
IRRIG SUCT STRYKERFLOW 2 WTIP (MISCELLANEOUS) ×3
IRRIGATION SUCT STRKRFLW 2 WTP (MISCELLANEOUS) ×1 IMPLANT
NEEDLE HYPO 22GX1.5 SAFETY (NEEDLE) ×3 IMPLANT
NS IRRIG 500ML POUR BTL (IV SOLUTION) ×3 IMPLANT
PACK BASIN DAY SURGERY FS (CUSTOM PROCEDURE TRAY) ×3 IMPLANT
POUCH RETRIEVAL ECOSAC 10 (ENDOMECHANICALS) ×1 IMPLANT
POUCH RETRIEVAL ECOSAC 10MM (ENDOMECHANICALS) ×2
SCISSORS LAP 5X35 DISP (ENDOMECHANICALS) IMPLANT
SHEARS HARMONIC ACE PLUS 36CM (ENDOMECHANICALS) IMPLANT
SOLUTION ANTI FOG 6CC (MISCELLANEOUS) ×3 IMPLANT
STOPCOCK 4 WAY LG BORE MALE ST (IV SETS) IMPLANT
SUT ETHILON 2 0 PS N (SUTURE) IMPLANT
SUT MNCRL AB 4-0 PS2 18 (SUTURE) ×3 IMPLANT
SUT VICRYL 0 ENDOLOOP (SUTURE) IMPLANT
SUT VICRYL 0 UR6 27IN ABS (SUTURE) IMPLANT
TOWEL OR 17X24 6PK STRL BLUE (TOWEL DISPOSABLE) ×6 IMPLANT
TRAY LAPAROSCOPIC (CUSTOM PROCEDURE TRAY) ×3 IMPLANT
TROCAR BLADELESS OPT 5 100 (ENDOMECHANICALS) ×9 IMPLANT
TROCAR XCEL 12X100 BLDLESS (ENDOMECHANICALS) ×3 IMPLANT
TUBING INSUF HEATED (TUBING) ×3 IMPLANT

## 2017-03-25 NOTE — H&P (Signed)
[name] is an 47 y.o. female.   Chief Complaint: abdominal pain HPI: 47 yo female with epigastric abdominal pain and multiple small stones on US.   Past Medical History:  Diagnosis Date  . Bell's palsy 03/2017  . Gallstones   . Gestational diabetes mellitus (GDM) 2005; 2006  . Headache    "monthly" (03/12/2017)  . History of chest pain   . History of prolonged Q-T interval on ECG    a. 09/2012 adm: QTC 506.  Marland Kitchen. Hypertension   . PAF (paroxysmal atrial fibrillation) (HCC) 2012   a. 07/2011 ED visit with AF-> converted with flecainide, never followed-up;  b. 05/2012 Recurrent Afib -echo: EF 65-60%, mildly dil LA. Placed on Xarelto/daily Flecainide. c. 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  . Small bowel obstruction (HCC) 09/2010   resolved  . Type II diabetes mellitus (HCC)    a. 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 b. 05/2012 A1c 12.6.    Past Surgical History:  Procedure Laterality Date  . CESAREAN SECTION  2005; 2006  . HERNIA REPAIR    . VENTRAL HERNIA REPAIR  02/05/2005   with mesh    Family History  Problem Relation Age of Onset  . Other Father        killed in [place] war when he was in his 4950's  . Asthma Mother        alive in her 4960's  . Other Sister        A & W  . Other Sister        A & W  . Heart disease Neg Hx    Social History:  reports that she has never smoked. She has never used smokeless tobacco. She reports that she does not drink alcohol or use drugs.  Allergies: No Known Allergies  Medications Prior to Admission  Medication Sig Dispense Refill  . amLODipine (NORVASC) 10 MG tablet Take 1 tablet (10 mg total) by mouth daily. 30 tablet 5  . Artificial Tear Ointment (ARTIFICIAL TEARS) ointment Place into the left eye as needed. Please use ointment daily at bedtime before sleeping. 3.5 g 12  . aspirin EC 81 MG tablet Take 81 mg by mouth daily.    Marland Kitchen. lisinopril-hydrochlorothiazide (ZESTORETIC) 20-12.5 MG tablet  Take 2 tablets by mouth daily. 180 tablet 0  . metFORMIN (GLUCOPHAGE) 1000 MG tablet Take 1 tablet (1,000 mg total) by mouth 2 (two) times daily with a meal. 60 tablet 5  . predniSONE (DELTASONE) 10 MG tablet Take 1 tablet (10 mg total) by mouth daily with breakfast. 15 tablet 0  . rivaroxaban (XARELTO) 20 MG TABS tablet Take 1 tablet (20 mg total) by mouth daily with supper. (Patient taking differently: Take 20 mg by mouth daily. ) 30 tablet 11  . Propylene Glycol (SYSTANE COMPLETE) 0.6 % SOLN Apply 1 drop to eye QID. 15 mL 2  . valACYclovir (VALTREX) 1000 MG tablet Take 1 tablet (1,000 mg total) by mouth 2 (two) times daily for 7 days. 14 tablet 0    Results for orders placed or performed during the hospital encounter of 03/25/17 (from the past 48 hour(s))  Pregnancy, urine POC     Status: None   Collection Time: 03/25/17  8:34 AM  Result Value Ref Range   Preg Test, Ur NEGATIVE NEGATIVE    Comment:        THE SENSITIVITY OF THIS METHODOLOGY IS >24 mIU/mL   Pregnancy, urine  POC     Status: None   Collection Time: 03/25/17  9:00 AM  Result Value Ref Range   Preg Test, Ur NEGATIVE NEGATIVE    Comment:        THE SENSITIVITY OF THIS METHODOLOGY IS >24 mIU/mL   Glucose, capillary     Status: Abnormal   Collection Time: 03/25/17  9:27 AM  Result Value Ref Range   Glucose-Capillary 308 (H) 65 - 99 mg/dL   No results found.  Review of Systems  Constitutional: Negative for chills and fever.  HENT: Negative for hearing loss.   Eyes: Negative for blurred vision and double vision.  Respiratory: Negative for cough and hemoptysis.   Cardiovascular: Negative for chest pain and palpitations.  Gastrointestinal: Positive for abdominal pain. Negative for nausea and vomiting.  Genitourinary: Negative for dysuria and urgency.  Musculoskeletal: Negative for myalgias and neck pain.  Skin: Negative for itching and rash.  Neurological: Negative for dizziness, tingling and headaches.   Endo/Heme/Allergies: Does not bruise/bleed easily.  Psychiatric/Behavioral: Negative for depression and suicidal ideas.    Blood pressure (!) 165/73, pulse 100, temperature 98.4 F (36.9 C), temperature source Oral, resp. rate 19, height 5\' 6"  (1.676 m), weight 76.6 kg (168 lb 12.8 oz), last menstrual period 10/01/2006, SpO2 100 %. Physical Exam  Vitals reviewed. Constitutional: She is oriented to person, place, and time. She appears well-developed and well-nourished.  HENT:  Head: Normocephalic and atraumatic.  Eyes: Pupils are equal, round, and reactive to light. Conjunctivae and EOM are normal.  Neck: Normal range of motion. Neck supple.  Cardiovascular: Normal rate and regular rhythm.  Respiratory: Effort normal and breath sounds normal.  GI: Soft. Bowel sounds are normal. She exhibits no distension. There is tenderness in the epigastric area.  Musculoskeletal: Normal range of motion.  Neurological: She is alert and oriented to person, place, and time.  Skin: Skin is warm and dry.  Psychiatric: She has a normal mood and affect. Her behavior is normal.     Assessment/Plan 47 yo female with abdominal pain consistent with chronic cholecystitis -lap chole -Enhanced recovery protocol -planned outpatient procedure  Rodman Pickle, MD 03/25/2017, 9:37 AM

## 2017-03-25 NOTE — Discharge Instructions (Signed)
°  Post Anesthesia Home Care Instructions  Activity: Get plenty of rest for the remainder of the day. A responsible individual must stay with you for 24 hours following the procedure.  For the next 24 hours, DO NOT: -Drive a car -Advertising copywriterperate machinery -Drink alcoholic beverages -Take any medication unless instructed by your physician -Make any legal decisions or sign important papers.  Meals: Start with liquid foods such as gelatin or soup. Progress to regular foods as tolerated. Avoid greasy, spicy, heavy foods. If nausea and/or vomiting occur, drink only clear liquids until the nausea and/or vomiting subsides. Call your physician if vomiting continues.  Special Instructions/Symptoms: Your throat may feel dry or sore from the anesthesia or the breathing tube placed in your throat during surgery. If this causes discomfort, gargle with warm salt water. The discomfort should disappear within 24 hours.  If you had a scopolamine patch placed behind your ear for the management of post- operative nausea and/or vomiting:  1. The medication in the patch is effective for 72 hours, after which it should be removed.  Wrap patch in a tissue and discard in the trash. Wash hands thoroughly with soap and water. 2. You may remove the patch earlier than 72 hours if you experience unpleasant side effects which may include dry mouth, dizziness or visual disturbances. 3. Avoid touching the patch. Wash your hands with soap and water after contact with the patch.   HOME CARE INSTRUCTIONS - LAPAROSCOPY  Wound Care: The bandaids or dressing which are placed over the skin openings may be removed the day after surgery. The incision should be kept clean and dry. The stitches do not need to be removed. Should the incision become sore, red, and swollen after the first week, check with your doctor.  Personal Hygiene: Shower the day after your procedure. Always wipe from front to back after elimination.   Activity: Do not  drive or operate any equipment today. The effects of the anesthesia are still present and drowsiness may result. Rest today, not necessarily flat bed rest, just take it easy. You may resume your normal activity in one to three days or as instructed by your physician.  Sexual Activity: You resume sexual activity as indicated by your physician_________.  Diet: Eat a light diet as desired this evening. You may resume a regular diet tomorrow.  Return to Work: Two to three days or as indicated by your doctor.  Expectations After Surgery:Marland Kitchen. Mild abdominal discomfort or tenderness is not unusual and some shoulder pain may also be noted which can be relieved by lying flat in pain.  Call Your Doctor If these Occur:  Persistent or heavy bleeding at incision site       Redness or swelling around incision       Elevation of temperature greater than 100 degrees F  Call for follow-up appointment _____________.

## 2017-03-25 NOTE — Op Note (Signed)
PATIENT:  [name]  47 y.o. female  PRE-OPERATIVE DIAGNOSIS:  chronic calculus cholecystitis  POST-OPERATIVE DIAGNOSIS:  chronic calculus cholecystitis  PROCEDURE:  Procedure(s): LAPAROSCOPIC CHOLECYSTECTOMY   SURGEON:  Surgeon(s): Kinsinger, De BlanchLuke Aaron, MD  ASSISTANT: none  ANESTHESIA:   local and general  Indications for procedure: [name] is a 47 y.o. female with symptoms of Abdominal pain and Nausea and vomiting consistent with gallbladder disease, Confirmed by Ultrasound.  Description of procedure: The patient was brought into the operative suite, placed supine. Anesthesia was administered with endotracheal tube. Patient was strapped in place and foot board was secured. All pressure points were offloaded by foam padding. The patient was prepped and draped in the usual sterile fashion.  A small incision was made in the right subcostal area. A 5mm trocar was inserted into the peritoneal cavity with optical entry. Pneumoperitoneum was applied with high flow low pressure. A small liver injury was made with entry. 1 5mm trocar was placed in the RUQ. A moderate amount of adhesions was seen in the infraumbilical area. The superior aspect of these were taken down with scissors to clear an area for periumbilical trocar. 1 5mm trocar was placed in the periumbilical spaceA 12mm trocar was placed in the subxiphoid space. 30 ml marcaine was infused to the subxiphoid space and lateral upper right abdomen in the preperitoneal spaces. Next the patient was placed in reverse trendelenberg. The gallbladder was blue and white in color.   The gallbladder was retracted cephalad and lateral. The peritoneum was reflected off the infundibulum working lateral to medial. The cystic duct and cystic artery were identified and further dissection revealed a critical view. The cystic duct and cystic artery were doubly clipped and ligated.   The gallbladder was removed off the liver bed with cautery. The  Gallbladder was placed in a specimen bag. The gallbladder fossa was irrigated and hemostasis was applied with cautery. The gallbladder was removed via the 12mm trocar. No dilation was required for removal, therefore no fascial closure was performed. Cautery was used for hemostasis of the liver injury from trocar entry. Pneumoperitoneum was removed, all trocar were removed. All incisions were closed with 4-0 monocryl subcuticular stitch. The patient woke from anesthesia and was brought to PACU in stable condition. All counts were correct  Findings: mild inflamed gallbladder  Specimen: gallbladder  Blood loss: 40 ml  Local anesthesia: 30 ml marcaine  Complications: none  PLAN OF CARE: Discharge to home after PACU  PATIENT DISPOSITION:  PACU - hemodynamically stable.  Feliciana RossettiLuke Kinsinger, M.D. General, Bariatric, & Minimally Invasive Surgery [facility]

## 2017-03-25 NOTE — Anesthesia Procedure Notes (Signed)
Procedure Name: Intubation Date/Time: 03/25/2017 10:08 AM Performed by: Montez Morita, Aaditya Letizia W, CRNA Pre-anesthesia Checklist: Patient identified, Emergency Drugs available, Suction available and Patient being monitored Patient Re-evaluated:Patient Re-evaluated prior to induction Oxygen Delivery Method: Circle system utilized Preoxygenation: Pre-oxygenation with 100% oxygen Induction Type: IV induction Ventilation: Mask ventilation without difficulty Laryngoscope Size: Miller and 2 Grade View: Grade I Tube type: Oral Tube size: 7.5 mm Number of attempts: 1 Airway Equipment and Method: Stylet and Oral airway Placement Confirmation: ETT inserted through vocal cords under direct vision,  positive ETCO2 and breath sounds checked- equal and bilateral Secured at: 20 cm Tube secured with: Tape Dental Injury: Teeth and Oropharynx as per pre-operative assessment

## 2017-03-25 NOTE — Anesthesia Preprocedure Evaluation (Signed)
Anesthesia Evaluation  Patient identified by MRN, date of birth, ID band Patient awake    Reviewed: Allergy & Precautions, NPO status , Patient's Chart, lab work & pertinent test results  Airway Mallampati: I  TM Distance: >3 FB Neck ROM: Full    Dental   Pulmonary    Pulmonary exam normal        Cardiovascular hypertension, Pt. on medications Normal cardiovascular exam     Neuro/Psych L Bells Palsy 03/2017    GI/Hepatic   Endo/Other  diabetes, Type 2, Oral Hypoglycemic Agents  Renal/GU      Musculoskeletal   Abdominal   Peds  Hematology   Anesthesia Other Findings   Reproductive/Obstetrics                             Anesthesia Physical Anesthesia Plan  ASA: III  Anesthesia Plan: General   Post-op Pain Management:    Induction:   PONV Risk Score and Plan: 3 and Ondansetron, Dexamethasone and Midazolam  Airway Management Planned: Oral ETT  Additional Equipment:   Intra-op Plan:   Post-operative Plan: Extubation in OR  Informed Consent: I have reviewed the patients History and Physical, chart, labs and discussed the procedure including the risks, benefits and alternatives for the proposed anesthesia with the patient or authorized representative who has indicated his/her understanding and acceptance.     Plan Discussed with: CRNA and Surgeon  Anesthesia Plan Comments:         Anesthesia Quick Evaluation

## 2017-03-25 NOTE — Telephone Encounter (Signed)
This has been addressed.

## 2017-03-25 NOTE — Transfer of Care (Signed)
Immediate Anesthesia Transfer of Care Note  Patient: Llewellyn T Puello  Procedure(s) Performed: LAPAROSCOPIC CHOLECYSTECTOMY (N/A )  Patient Location: PACU  Anesthesia Type:General  Level of Consciousness: sedated  Airway & Oxygen Therapy: Patient Spontanous Breathing and Patient connected to face mask oxygen  Post-op Assessment: Report given to RN and Post -op Vital signs reviewed and stable  Post vital signs: Reviewed and stable  Last Vitals:  Vitals Value Taken Time  BP    Temp    Pulse 85 03/25/2017 10:58 AM  Resp    SpO2 97 % 03/25/2017 10:58 AM  Vitals shown include unvalidated device data.  Last Pain:  Vitals:   03/25/17 0847  TempSrc:   PainSc: 0-No pain         Complications: No apparent anesthesia complications

## 2017-03-25 NOTE — Anesthesia Postprocedure Evaluation (Signed)
Anesthesia Post Note  Patient: [name]  Procedure(s) Performed: LAPAROSCOPIC CHOLECYSTECTOMY (N/A )     Patient location during evaluation: PACU Anesthesia Type: General Level of consciousness: awake and alert Pain management: pain level controlled Vital Signs Assessment: post-procedure vital signs reviewed and stable Respiratory status: spontaneous breathing, nonlabored ventilation, respiratory function stable and patient connected to nasal cannula oxygen Cardiovascular status: blood pressure returned to baseline and stable Postop Assessment: no apparent nausea or vomiting Anesthetic complications: no    Last Vitals:  Vitals Value Taken Time  BP    Temp    Pulse    Resp    SpO2      Last Pain:  Vitals:   03/25/17 1330  TempSrc:   PainSc: 5                  Raevin Wierenga DAVID

## 2017-03-26 ENCOUNTER — Encounter (HOSPITAL_BASED_OUTPATIENT_CLINIC_OR_DEPARTMENT_OTHER): Payer: Self-pay | Admitting: General Surgery

## 2017-03-26 MED FILL — IBUPROFEN 800 MG TAB: 800 | 10 days supply | Qty: 30 | Fill #0

## 2017-03-26 MED FILL — HYDROCODON-APAP 5-325: 5-325 | 5 days supply | Qty: 20 | Fill #0

## 2017-03-31 ENCOUNTER — Other Ambulatory Visit: Payer: Self-pay

## 2017-03-31 ENCOUNTER — Emergency Department (HOSPITAL_COMMUNITY): Payer: BC Managed Care – PPO

## 2017-03-31 ENCOUNTER — Encounter (HOSPITAL_COMMUNITY): Payer: Self-pay | Admitting: Emergency Medicine

## 2017-03-31 DIAGNOSIS — I214 Non-ST elevation (NSTEMI) myocardial infarction: Secondary | ICD-10-CM | POA: Diagnosis not present

## 2017-03-31 DIAGNOSIS — I422 Other hypertrophic cardiomyopathy: Secondary | ICD-10-CM | POA: Diagnosis not present

## 2017-03-31 DIAGNOSIS — R002 Palpitations: Principal | ICD-10-CM | POA: Insufficient documentation

## 2017-03-31 DIAGNOSIS — I4891 Unspecified atrial fibrillation: Secondary | ICD-10-CM | POA: Diagnosis not present

## 2017-03-31 DIAGNOSIS — Z7982 Long term (current) use of aspirin: Secondary | ICD-10-CM | POA: Diagnosis not present

## 2017-03-31 DIAGNOSIS — I10 Essential (primary) hypertension: Secondary | ICD-10-CM | POA: Diagnosis not present

## 2017-03-31 DIAGNOSIS — E119 Type 2 diabetes mellitus without complications: Secondary | ICD-10-CM | POA: Insufficient documentation

## 2017-03-31 DIAGNOSIS — Z79899 Other long term (current) drug therapy: Secondary | ICD-10-CM | POA: Diagnosis not present

## 2017-03-31 DIAGNOSIS — Z7901 Long term (current) use of anticoagulants: Secondary | ICD-10-CM | POA: Insufficient documentation

## 2017-03-31 DIAGNOSIS — Z7984 Long term (current) use of oral hypoglycemic drugs: Secondary | ICD-10-CM | POA: Diagnosis not present

## 2017-03-31 LAB — BASIC METABOLIC PANEL
Anion gap: 18 — ABNORMAL HIGH (ref 5–15)
BUN: 23 mg/dL — ABNORMAL HIGH (ref 6–20)
CALCIUM: 9.5 mg/dL (ref 8.9–10.3)
CHLORIDE: 90 mmol/L — AB (ref 101–111)
CO2: 27 mmol/L (ref 22–32)
Creatinine, Ser: 1.14 mg/dL — ABNORMAL HIGH (ref 0.44–1.00)
GFR calc Af Amer: >60 mL/min (ref >60)
GFR calc non Af Amer: 57 mL/min — ABNORMAL LOW (ref >60)
GLUCOSE: 259 mg/dL — AB (ref 65–99)
Potassium: 3.5 mmol/L (ref 3.5–5.1)
Sodium: 135 mmol/L (ref 135–145)

## 2017-03-31 LAB — I-STAT TROPONIN, ED: TROPONIN I, POC: 0.15 ng/mL — AB (ref 0.00–0.08)

## 2017-03-31 LAB — CBC
HCT: 39.2 % (ref 36.0–46.0)
HEMOGLOBIN: 13 g/dL (ref 12.0–15.0)
MCH: 26.1 pg (ref 26.0–34.0)
MCHC: 33.2 g/dL (ref 30.0–36.0)
MCV: 78.7 fL (ref 78.0–100.0)
Platelets: 313 10*3/uL (ref 150–400)
RBC: 4.98 MIL/uL (ref 3.87–5.11)
RDW: 13.9 % (ref 11.5–15.5)
WBC: 15.1 10*3/uL — ABNORMAL HIGH (ref 4.0–10.5)

## 2017-03-31 LAB — I-STAT BETA HCG BLOOD, ED (MC, WL, AP ONLY): I-stat hCG, quantitative: <5 m[IU]/mL (ref <5)

## 2017-03-31 NOTE — ED Triage Notes (Signed)
Pt reports she feels like her heart has been racing since this AM. HR noted to be 110's. Pt has hx of afib, had recent surgery at beginning of march. Denies CP, SOB

## 2017-04-01 ENCOUNTER — Emergency Department (HOSPITAL_COMMUNITY): Payer: BC Managed Care – PPO

## 2017-04-01 ENCOUNTER — Other Ambulatory Visit: Payer: Self-pay

## 2017-04-01 ENCOUNTER — Observation Stay (HOSPITAL_COMMUNITY)
Admission: EM | Admit: 2017-04-01 | Discharge: 2017-04-03 | Disposition: A | Discharge status: Home / Self Care | Payer: BC Managed Care – PPO | Attending: Internal Medicine | Admitting: Internal Medicine

## 2017-04-01 ENCOUNTER — Encounter (HOSPITAL_COMMUNITY): Payer: Self-pay | Admitting: Internal Medicine

## 2017-04-01 DIAGNOSIS — E1169 Type 2 diabetes mellitus with other specified complication: Secondary | ICD-10-CM

## 2017-04-01 DIAGNOSIS — Z7901 Long term (current) use of anticoagulants: Secondary | ICD-10-CM | POA: Diagnosis not present

## 2017-04-01 DIAGNOSIS — Z8669 Personal history of other diseases of the nervous system and sense organs: Secondary | ICD-10-CM | POA: Diagnosis not present

## 2017-04-01 DIAGNOSIS — Z9049 Acquired absence of other specified parts of digestive tract: Secondary | ICD-10-CM

## 2017-04-01 DIAGNOSIS — I10 Essential (primary) hypertension: Secondary | ICD-10-CM | POA: Diagnosis present

## 2017-04-01 DIAGNOSIS — Z7984 Long term (current) use of oral hypoglycemic drugs: Secondary | ICD-10-CM

## 2017-04-01 DIAGNOSIS — E2609 Other primary hyperaldosteronism: Secondary | ICD-10-CM | POA: Diagnosis present

## 2017-04-01 DIAGNOSIS — Z79899 Other long term (current) drug therapy: Secondary | ICD-10-CM

## 2017-04-01 DIAGNOSIS — I480 Paroxysmal atrial fibrillation: Secondary | ICD-10-CM | POA: Diagnosis not present

## 2017-04-01 DIAGNOSIS — I421 Obstructive hypertrophic cardiomyopathy: Secondary | ICD-10-CM

## 2017-04-01 DIAGNOSIS — K811 Chronic cholecystitis: Secondary | ICD-10-CM

## 2017-04-01 DIAGNOSIS — R011 Cardiac murmur, unspecified: Secondary | ICD-10-CM | POA: Diagnosis not present

## 2017-04-01 DIAGNOSIS — R002 Palpitations: Secondary | ICD-10-CM | POA: Diagnosis present

## 2017-04-01 DIAGNOSIS — R7989 Other specified abnormal findings of blood chemistry: Secondary | ICD-10-CM | POA: Diagnosis not present

## 2017-04-01 DIAGNOSIS — E119 Type 2 diabetes mellitus without complications: Secondary | ICD-10-CM

## 2017-04-01 DIAGNOSIS — I4891 Unspecified atrial fibrillation: Secondary | ICD-10-CM | POA: Diagnosis present

## 2017-04-01 DIAGNOSIS — I214 Non-ST elevation (NSTEMI) myocardial infarction: Secondary | ICD-10-CM

## 2017-04-01 LAB — BASIC METABOLIC PANEL
Anion gap: 12 (ref 5–15)
BUN: 17 mg/dL (ref 6–20)
CO2: 27 mmol/L (ref 22–32)
Calcium: 9 mg/dL (ref 8.9–10.3)
Chloride: 94 mmol/L — ABNORMAL LOW (ref 101–111)
Creatinine, Ser: 1.02 mg/dL — ABNORMAL HIGH (ref 0.44–1.00)
GFR calc Af Amer: >60 mL/min (ref >60)
GFR calc non Af Amer: >60 mL/min (ref >60)
Glucose, Bld: 245 mg/dL — ABNORMAL HIGH (ref 65–99)
Potassium: 3.5 mmol/L (ref 3.5–5.1)
Sodium: 133 mmol/L — ABNORMAL LOW (ref 135–145)

## 2017-04-01 LAB — GLUCOSE, CAPILLARY: Glucose-Capillary: 220 mg/dL — ABNORMAL HIGH (ref 65–99)

## 2017-04-01 LAB — CBG MONITORING, ED
Glucose-Capillary: 246 mg/dL — ABNORMAL HIGH (ref 65–99)
Glucose-Capillary: 189 mg/dL — ABNORMAL HIGH (ref 65–99)
Glucose-Capillary: 189 mg/dL — ABNORMAL HIGH (ref 65–99)

## 2017-04-01 LAB — MAGNESIUM: Magnesium: 2 mg/dL (ref 1.7–2.4)

## 2017-04-01 LAB — TROPONIN I
TROPONIN I: 0.12 ng/mL — AB (ref <0.03)
TROPONIN I: 0.13 ng/mL — AB (ref <0.03)
Troponin I: 0.11 ng/mL (ref <0.03)

## 2017-04-01 LAB — I-STAT TROPONIN, ED: Troponin i, poc: 0.13 ng/mL (ref 0.00–0.08)

## 2017-04-01 MED ORDER — SODIUM CHLORIDE 0.9% FLUSH
3.0000 mL | Freq: Two times a day (BID) | INTRAVENOUS | Status: DC
  Administered 2017-04-01 – 2017-04-03 (×2): 3 mL via INTRAVENOUS

## 2017-04-01 MED ORDER — AMLODIPINE BESYLATE 10 MG PO TABS
10.0000 mg | ORAL_TABLET | Freq: Every day | ORAL | Status: DC
  Administered 2017-04-02: 10 mg via ORAL
  Filled 2017-04-01: qty 1

## 2017-04-01 MED ORDER — ASPIRIN EC 81 MG PO TBEC
81.0000 mg | DELAYED_RELEASE_TABLET | Freq: Every day | ORAL | Status: DC
  Administered 2017-04-01 – 2017-04-03 (×3): 81 mg via ORAL
  Filled 2017-04-01 (×3): qty 1

## 2017-04-01 MED ORDER — RIVAROXABAN 20 MG PO TABS
20.0000 mg | ORAL_TABLET | Freq: Every day | ORAL | Status: DC
  Administered 2017-04-01 – 2017-04-02 (×2): 20 mg via ORAL
  Filled 2017-04-01 (×3): qty 1

## 2017-04-01 MED ORDER — SODIUM CHLORIDE 0.9 % IV SOLN
INTRAVENOUS | Status: AC
  Administered 2017-04-01: 09:00:00 via INTRAVENOUS

## 2017-04-01 MED ORDER — IOPAMIDOL (ISOVUE-370) INJECTION 76%
INTRAVENOUS | Status: AC
  Administered 2017-04-01: 100 mL
  Filled 2017-04-01: qty 100

## 2017-04-01 MED ORDER — INSULIN ASPART 100 UNIT/ML ~~LOC~~ SOLN
0.0000 [IU] | Freq: Three times a day (TID) | SUBCUTANEOUS | Status: DC
  Administered 2017-04-01: 3 [IU] via SUBCUTANEOUS
  Administered 2017-04-01: 2 [IU] via SUBCUTANEOUS
  Administered 2017-04-02 (×2): 3 [IU] via SUBCUTANEOUS
  Administered 2017-04-02 – 2017-04-03 (×2): 2 [IU] via SUBCUTANEOUS
  Filled 2017-04-01 (×2): qty 1

## 2017-04-01 NOTE — ED Provider Notes (Signed)
[facility] EMERGENCY DEPARTMENT Provider Note   CSN: [id number] Arrival date & time: 03/31/17  2155     History   Chief Complaint Chief Complaint  Patient presents with  . Tachycardia    HPI [name] is a 47 y.o. female.  The history is provided by the patient and medical records.    47 y.o. F with hx of headaches, PAF, DM2, HTN, heart murmur, presenting to the ED for palpitations.  States this began yesterday morning, unsure what time.  States it has been ongoing since then, waxing and waning in severity.  Denies chest pain or SOB.  No cough, fever, chills, sweats.  Patient has remote hx of AFIB, usually remains in NSR.  She does take xarelto, however held it for 5 days due to recent lap chole on 03/25/17.  States she did resume it on Sunday evening as instructed.  She has no prior hx of DVT or PE.  States her abdomen is a bit sore from surgery but no significant pain, has been recovering well at home.  Past Medical History:  Diagnosis Date  . Bell's palsy 03/2017  . Gallstones   . Gestational diabetes mellitus (GDM) 2005; 2006  . Headache    "monthly" (03/12/2017)  . History of chest pain   . History of prolonged Q-T interval on ECG    a. 09/2012 adm: QTC 506.  Marland Kitchen Hypertension   . PAF (paroxysmal atrial fibrillation) (HCC) 2012   a. 07/2011 ED visit with AF-> converted with flecainide, never followed-up;  b. 05/2012 Recurrent Afib -echo: EF 65-60%, mildly dil LA. Placed on Xarelto/daily Flecainide. c. 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  . Small bowel obstruction (HCC) 09/2010   resolved  . Type II diabetes mellitus (HCC)    a. 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 b. 05/2012 A1c 12.6.    Patient Active Problem List   Diagnosis Date Noted  . Bell's palsy 03/11/2017  . Focal neurological deficit, onset greater than 24 hours 03/11/2017  . Healthcare maintenance 01/17/2017  . Cholelithiasis 01/15/2017  . Murmur  03/07/2014  . Atrial fibrillation with RVR (HCC) 05/23/2012  . HTN (hypertension) 05/23/2012  . Diabetes mellitus (HCC) 05/23/2012    Past Surgical History:  Procedure Laterality Date  . CESAREAN SECTION  2005; 2006  . CHOLECYSTECTOMY N/A 03/25/2017   Procedure: LAPAROSCOPIC CHOLECYSTECTOMY;  Surgeon: Kinsinger, De Blanch, MD;  Location: [facility];  Service: General;  Laterality: N/A;  . HERNIA REPAIR    . VENTRAL HERNIA REPAIR  02/05/2005   with mesh     OB History   None      Home Medications    Prior to Admission medications   Medication Sig Start Date End Date Taking? Authorizing Provider  amLODipine (NORVASC) 10 MG tablet Take 1 tablet (10 mg total) by mouth daily. 03/18/17 03/18/18  Arnetha Courser, MD  Artificial Tear Ointment (ARTIFICIAL TEARS) ointment Place into the left eye as needed. Please use ointment daily at bedtime before sleeping. 03/18/17   Arnetha Courser, MD  aspirin EC 81 MG tablet Take 81 mg by mouth daily.    [provider]  HYDROcodone-acetaminophen (NORCO/VICODIN) 5-325 MG tablet Take 1 tablet by mouth every 6 (six) hours as needed for moderate pain. 03/25/17   Kinsinger, De Blanch, MD  ibuprofen (ADVIL,MOTRIN) 800 MG tablet Take 1 tablet (800 mg total) by mouth every 8 (eight) hours as needed. 03/25/17   Kinsinger,  De Blanch, MD  lisinopril-hydrochlorothiazide (ZESTORETIC) 20-12.5 MG tablet Take 2 tablets by mouth daily. 02/05/17   John Giovanni, MD  metFORMIN (GLUCOPHAGE) 1000 MG tablet Take 1 tablet (1,000 mg total) by mouth 2 (two) times daily with a meal. 03/05/17   Gwynn Burly, DO  predniSONE (DELTASONE) 10 MG tablet Take 1 tablet (10 mg total) by mouth daily with breakfast. 03/18/17   Arnetha Courser, MD  Propylene Glycol (SYSTANE COMPLETE) 0.6 % SOLN Apply 1 drop to eye QID. 03/18/17   Arnetha Courser, MD  rivaroxaban (XARELTO) 20 MG TABS tablet Take 1 tablet (20 mg total) by mouth daily with supper. 03/28/17   Kinsinger, De Blanch, MD    Family History Family History  Problem Relation Age of Onset  . Other Father        killed in [place] civil war when he was in his 55's  . Asthma Mother        alive in her 12's  . Other Sister        A & W  . Other Sister        A & W  . Heart disease Neg Hx     Social History Social History   Tobacco Use  . Smoking status: Never Smoker  . Smokeless tobacco: Never Used  Substance Use Topics  . Alcohol use: No  . Drug use: No     Allergies   Patient has no known allergies.   Review of Systems Review of Systems  Cardiovascular: Positive for palpitations.  All other systems reviewed and are negative.    Physical Exam Updated Vital Signs BP 106/81 (BP Location: Right Arm)   Pulse (!) 101   Temp 98.3 F (36.8 C) (Oral)   Resp 16   Ht 5\' 6"  (1.676 m)   Wt 77.1 kg (170 lb)   LMP 10/01/2006   SpO2 98%   BMI 27.44 kg/m   Physical Exam  Constitutional: She is oriented to person, place, and time. She appears well-developed and well-nourished.  HENT:  Head: Normocephalic and atraumatic.  Mouth/Throat: Oropharynx is clear and moist.  Eyes: Pupils are equal, round, and reactive to light. Conjunctivae and EOM are normal.  Neck: Normal range of motion.  Cardiovascular: Normal rate and regular rhythm.  Murmur heard. Pulmonary/Chest: Effort normal and breath sounds normal. No stridor. No respiratory distress.  Abdominal: Soft. Bowel sounds are normal. There is no tenderness. There is no rebound.  Musculoskeletal: Normal range of motion.  Neurological: She is alert and oriented to person, place, and time.  Skin: Skin is warm and dry.  Psychiatric: She has a normal mood and affect.  Nursing note and vitals reviewed.    ED Treatments / Results  Labs (all labs ordered are listed, but only abnormal results are displayed) Labs Reviewed  BASIC METABOLIC PANEL - Abnormal; Notable for the following components:      Result Value   Chloride 90 (*)     Glucose, Bld 259 (*)    BUN 23 (*)    Creatinine, Ser 1.14 (*)    GFR calc non Af Amer 57 (*)    Anion gap 18 (*)    All other components within normal limits  CBC - Abnormal; Notable for the following components:   WBC 15.1 (*)    All other components within normal limits  I-STAT TROPONIN, ED - Abnormal; Notable for the following components:   Troponin i, poc 0.15 (*)    All other components  within normal limits  I-STAT BETA HCG BLOOD, ED ([facility], [facility], [facility] ONLY)  I-STAT TROPONIN, ED    EKG None  Radiology Dg Chest 2 View  Result Date: 03/31/2017 CLINICAL DATA:  47 year old female with tachycardia, palpitations since this morning. History of atrial fibrillation. EXAM: CHEST - 2 VIEW COMPARISON:  Chest radiographs 03/11/2017 and earlier. FINDINGS: Cardiac and mediastinal contours are stable and within normal limits. Visualized tracheal air column is within normal limits. Stable lung volumes. Both lungs are clear. No pneumothorax or pleural effusion. No acute osseous abnormality identified. Negative visible bowel gas pattern. IMPRESSION: Negative.  No acute cardiopulmonary abnormality. Electronically Signed   By: Odessa FlemingH  Hall M.D.   On: 03/31/2017 22:45    Procedures Procedures (including critical care time)  CRITICAL CARE Performed by: Garlon HatchetLisa M Katilin Raynes   Total critical care time: 32 minutes  Critical care time was exclusive of separately billable procedures and treating other patients.  Critical care was necessary to treat or prevent imminent or life-threatening deterioration.  Critical care was time spent personally by me on the following activities: development of treatment plan with patient and/or surrogate as well as nursing, discussions with consultants, evaluation of patient's response to treatment, examination of patient, obtaining history from patient or surrogate, ordering and performing treatments and interventions, ordering and review of laboratory studies, ordering and review of  radiographic studies, pulse oximetry and re-evaluation of patient's condition.   Medications Ordered in ED Medications - No data to display   Initial Impression / Assessment and Plan / ED Course  I have reviewed the triage vital signs and the nursing notes.  Pertinent labs & imaging results that were available during my care of the patient were reviewed by me and considered in my medical decision making (see chart for details).  47 year old female presenting to the ED with nearly 24 hours of palpitations.  She is in a rhythm of sinus tachycardia here.  Does have history of A. fib.  Has recently been off of anticoagulation for a few days secondary to lap chole, she resumed it 3 days ago.  She denies any chest pain or shortness of breath.  EKG without any acute ischemic changes.  Labs overall reassuring, troponin is bumped slightly at 0.15.  Given her a break in anticoagulation, some clinical concern for possible PE.  Will obtain CTA of the chest.  CTA negative for PE.  Patient's delta troponin remains elevated at 0.13.  She has been evaluated multiple times previously for chest pain that was felt to be atypical, has always had negative enzymes until today.  Will discuss with cardiology for recommendations.  Discussed with on call cardiology, Dr. Cristina GongVedre-- feels elevated troponin likely due to palpitations.  Given her prior negative evaluation and newly elevated troponin, can be ruled out in hospital.  Does not feel she needs emergent cath or other procedure so can be admitted to medicine service.  Discussed with internal medicine teaching service, morning team will come down and admit for ongoing care.  Given she is anticoagulated with Xarelto, will hold off on starting heparin at this time.  Final Clinical Impressions(s) / ED Diagnoses   Final diagnoses:  Palpitations  NSTEMI (non-ST elevated myocardial infarction) [facility](HCC)    ED Discharge Orders    None       Garlon HatchetSanders, Kamali Nephew M,  PA-C 04/01/17 0645    Palumbo, April, MD 04/01/17 [phone]

## 2017-04-01 NOTE — Progress Notes (Signed)
Patient admitted to 6E03 from ED Overflow [facility](5C) via s/c.  Bed in low position, wheels locked.  Patient denies chest pain/shortness of breath.  Portable telemetry monitor applied.  Patient oriented to environment, including call bell, TV, meal times, and hourly rounding.

## 2017-04-01 NOTE — ED Notes (Signed)
Patient transported to CT 

## 2017-04-01 NOTE — Progress Notes (Signed)
Inpatient Diabetes Program Recommendations  AACE/ADA: New Consensus Statement on Inpatient Glycemic Control (2015)  Target Ranges:  Prepandial:   less than 140 mg/dL      Peak postprandial:   less than 180 mg/dL (1-2 hours)      Critically ill patients:  140 - 180 mg/dL   Lab Results  Component Value Date   GLUCAP 246 (H) 04/01/2017   HGBA1C 10.9 (H) 03/17/2017   Review of Glycemic Control  Diabetes history: DM 2 Outpatient Diabetes medications: Metformin 1000 mg BID Current orders for Inpatient glycemic control: Novolog Sensitive Correction 0-9 units tid  Inpatient Diabetes Program Recommendations:    Per A1c level 10.9%, patient would benefit from dual therapy outpatient. Patient only on Metformin at this time. Noted patient on recent steroids for Bells Palsy as well. Consider a second agent at time of d/c. Patient saw Educator on 3/1.   Consider increasing Correction scale to Novolog Moderate 0-15 units tid, add Novolog HS scale 0-5 units. Patient may need low dose basal insulin as well depending on response of short acting insulin.   Thanks,  Christena DeemShannon Cleburn Maiolo RN, MSN, BC-ADM, [facility] Inpatient Diabetes Coordinator Team Pager [phone] (8a-5p)

## 2017-04-01 NOTE — ED Notes (Signed)
Pt CBG was 246, notified Lynnze(RN)

## 2017-04-01 NOTE — ED Notes (Signed)
Holding pt insulin until meal tray arrives.

## 2017-04-01 NOTE — H&P (Signed)
Date: 04/01/2017               Patient Name:  [name] MRN: [id number]  DOB: [birth year] Age / Sex: 47 y.o., female   PCP: Burna Cash, MD         Medical Service: Internal Medicine Teaching Service         Attending Physician: Dr. Sandre Kitty Elwin Mocha, MD    First Contact: Dr. Delma Officer Pager: [phone]  Second Contact: Dr. Vincente Liberty Pager: [phone]       After Hours (After 5p/  First Contact Pager: [phone]  weekends / holidays): Second Contact Pager: [phone]   Chief Complaint: Palpitations  History of Present Illness:  Ms. Sharayah Renfrow is a 47 year old woman with a medical history significant for atrial fibrillation on Xarelto, hypertension, type 2 diabetes, and chronic calculus cholecystitis status post laparoscopic cholecystectomy on 03/25/2017 who presented to the emergency department with feelings of palpitations.  She reports feelings in her heart racing beginning yesterday morning which was associated with diaphoresis and lightheadedness.  She did not feel like her heart was skipping beats or jumping around.  She did not have any associated chest pain, dyspnea, nausea, vomiting, or loss of consciousness.  She thought her symptoms were occurring due to being off of her Xarelto post her surgical procedure.  She resumed her Xarelto as instructed yesterday but did not have any improvement in her symptoms so she decided to present to the emergency department.  She denies any similar symptoms in the past.  She denies any excess caffeine intake including coffee, tea, soda, or energy drinks.  She denies ever using tobacco, alcohol, or illicit drug use.  She denies any recent illness or missing any of her other medications. She was prescribed a short course of Norco and Ibuprofen for post-op pain.  Of note she was admitted from 3/7-03/12/2017 for Bell's palsy for which she completed a course of prednisone and valacyclovir.  In the ED, initial vitals were BP 93/70, pulse 107, temp  98.56F, RR 16, SPO2 99% on RA.  Lab work was notable for an i-STAT troponin of 0.15, WBC 15.1, BUN 23, creatinine 1.14 (baseline ~0.8), K 3.5, serum glucose 259.  Initial EKG showed sinus tachycardia with rate of 108 bpm, LVH with repolarization changes, and biatrial enlargement.  A 2 view chest x-ray showed clear lung fields without infiltration, consolidation, pleural effusion.  Due to concern for PE while being off of anticoagulation a CTA chest PE study was obtained which was negative for PE or acute process.  Repeat i-STAT troponin was 0.13 in the ED provider discussed the case with the on-call cardiologist who felt like the troponin elevation was likely secondary to the palpitations. IMTS were consulted for admission for ACS rule out.  Meds:  Current Meds  Medication Sig  . amLODipine (NORVASC) 10 MG tablet Take 1 tablet (10 mg total) by mouth daily.  . Artificial Tear Ointment (ARTIFICIAL TEARS) ointment Place into the left eye as needed. Please use ointment daily at bedtime before sleeping. (Patient taking differently: Place 1-2 drops into the left eye at bedtime. Please use ointment daily at bedtime before sleeping.)  . aspirin EC 81 MG tablet Take 81 mg by mouth daily.  Marland Kitchen HYDROcodone-acetaminophen (NORCO/VICODIN) 5-325 MG tablet Take 1 tablet by mouth every 6 (six) hours as needed for moderate pain.  Marland Kitchen ibuprofen (ADVIL,MOTRIN) 800 MG tablet Take 1 tablet (800 mg total) by mouth every 8 (eight) hours as needed. (  Patient taking differently: Take 800 mg by mouth every 8 (eight) hours as needed for moderate pain. )  . lisinopril-hydrochlorothiazide (ZESTORETIC) 20-12.5 MG tablet Take 2 tablets by mouth daily.  . metFORMIN (GLUCOPHAGE) 1000 MG tablet Take 1 tablet (1,000 mg total) by mouth 2 (two) times daily with a meal.  . Propylene Glycol (SYSTANE COMPLETE) 0.6 % SOLN Apply 1 drop to eye QID.  Marland Kitchen. rivaroxaban (XARELTO) 20 MG TABS tablet Take 1 tablet (20 mg total) by mouth daily with supper.      Allergies: Allergies as of 03/31/2017  . (No Known Allergies)   Past Medical History:  Diagnosis Date  . Bell's palsy 03/2017  . Gallstones   . Gestational diabetes mellitus (GDM) 2005; 2006  . Headache    "monthly" (03/12/2017)  . History of chest pain   . History of prolonged Q-T interval on ECG    a. 09/2012 adm: QTC 506.  Marland Kitchen. Hypertension   . PAF (paroxysmal atrial fibrillation) (HCC) 2012   a. 07/2011 ED visit with AF-> converted with flecainide, never followed-up;  b. 05/2012 Recurrent Afib -echo: EF 65-60%, mildly dil LA. Placed on Xarelto/daily Flecainide. c. 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  . Small bowel obstruction (HCC) 09/2010   resolved  . Type II diabetes mellitus (HCC)    a. 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 b. 05/2012 A1c 12.6.    Family History:  Family History  Problem Relation Age of Onset  . Other Father        killed in [place] civil war when he was in his 8250's  . Asthma Mother        alive in her 4960's  . Other Sister        A & W  . Other Sister        A & W  . Heart disease Neg Hx    Social History:  Social History   Socioeconomic History  . Marital status: Divorced    Spouse name: Not on file  . Number of children: 2  . Years of education: Not on file  . Highest education level: Not on file  Occupational History  . Occupation: Equities traderHOUSEKEEPING    Employer: [employer]  Social Needs  . Financial resource strain: Not on file  . Food insecurity:    Worry: Not on file    Inability: Not on file  . Transportation needs:    Medical: Not on file    Non-medical: Not on file  Tobacco Use  . Smoking status: Never Smoker  . Smokeless tobacco: Never Used  Substance and Sexual Activity  . Alcohol use: Never    Frequency: Never  . Drug use: Never  . Sexual activity: Not Currently    Partners: Male  Lifestyle  . Physical activity:    Days per week: Not on file    Minutes per session: Not on file  .  Stress: Not on file  Relationships  . Social connections:    Talks on phone: Not on file    Gets together: Not on file    Attends religious service: Not on file    Active member of club or organization: Not on file    Attends meetings of clubs or organizations: Not on file    Relationship status: Not on file  Other Topics Concern  . Not on file  Social History Narrative   ** Merged History Encounter **       **  Data from: 11/07/10 Enc Dept: [facility]   Pt has 6 sisters but doe not know their health history or status.      Mother and father unknown      Pt is an immigrant from [place], lives in [place], [place] by herself           ** Data from: 09/19/13 Enc Dept: [facility]   Lives in [place] with significant other.  Works @ [employer] in [employer].  Does not routinely exercise.     Review of Systems: A complete ROS was negative except as per HPI.   Physical Exam: Blood pressure 103/71, pulse 93, temperature 98.3 F (36.8 C), temperature source Oral, resp. rate 19, height 5\' 6"  (1.676 m), weight 170 lb (77.1 kg), last menstrual period 10/01/2006, SpO2 100 %. Physical Exam  Constitutional: She appears well-developed and well-nourished. No distress.  HENT:  Head: Normocephalic and atraumatic.  Eyes: Pupils are equal, round, and reactive to light.  Cardiovascular: Regular rhythm and intact distal pulses.  Borderline tachycardia, 3/6 systolic murmur loudest at the LUS border radiating to the carotids  Pulmonary/Chest: Effort normal. No respiratory distress. She has no wheezes. She has no rales.  Abdominal: Soft. Bowel sounds are normal. She exhibits no distension.  Slight lower abdominal tenderness, 3 well healing laparascopic surgery scars  Musculoskeletal: She exhibits no edema or tenderness.  Neurological: She is alert.  Skin: Skin is warm. She is not diaphoretic.  Psychiatric: She has a normal mood and affect.     EKG: personally reviewed my  interpretation is sinus tachycardia with rate of 108 bpm, LVH with repolarization changes, and biatrial enlargement.  CXR: personally reviewed my interpretation is clear lung fields without infiltration, consolidation, pleural effusion. No cardiomegaly.  Assessment & Plan by Problem: Active Problems:   Atrial fibrillation (HCC)   Hypertension associated with diabetes (HCC)   Diabetes mellitus [facility])   Palpitations   47 year old woman with a medical history significant for atrial fibrillation on Xarelto, hypertension, type 2 diabetes, and chronic calculus cholecystitis status post laparoscopic cholecystectomy on 03/25/2017 who is admitted for palpitations with mild troponin elevation.  Palpitations and mild troponin elevation in setting of Paroxysmal Atrial Fibrillation: She had an episode of palpitations which has now resolved.  Initial EKG and telemetry show sinus rhythm without atrial fibrillation.  It is possible she had an episode of A. fib with RVR causing her symptoms resulting in mild troponin elevation from demand ischemia and slight increase in creatinine from hypoperfusion.  Recent echo on 03/12/2017 showed an EF of 65-70%, mild LVH, grade 1 diastolic dysfunction and no significant change compared to prior study in 2016.  She had a myocardial perfusion study on 03/26/2014 which was normal with no evidence of ischemia or infarction.  She is now asymptomatic with improved heart rate.  We will admit to telemetry for observation and troponin trend. - Admit to telemetry - Continue home Xarelto 20 mg daily - Trend Troponin I: 0.12 >> - TSH 1.116 on 03/11/17 - Mg 2.0 - May benefit from adjusting home BP meds to add a beta-blocker for palpitations/Afib rate control - NS @ 100 mL/hr x 12 hrs for mild creatinine bump and post-contrast hydration  T2DM: Hgb A1c 10.9 on 03/17/17. On Metformin 1000 mg BID at home. - Hold Metformin - SSI-S - Check Urine Microalb/Cr ratio for ACE-I/ARB  indication  HTN: On Lisinopril-HCTZ 40-25 mg daily and Amlodipine 10 mg daily at home. - Continue Amlodipine 10 mg  daily - Hold Lisinopril-HCTZ with mild Cr bump and normotension. - As above, consider beta-blocker substitution for Afib/palpitations  Diet: HH/Carb VTE ppx: Xarelto Code status: FULL   Dispo: Admit patient to Observation with expected length of stay less than 2 midnights.  Signed: Darreld Mclean, MD  Internal Medicine PGY-3 04/01/2017, 10:17 AM

## 2017-04-01 NOTE — ED Notes (Signed)
Pt given [place] sand which meal

## 2017-04-02 ENCOUNTER — Observation Stay (HOSPITAL_BASED_OUTPATIENT_CLINIC_OR_DEPARTMENT_OTHER): Payer: BC Managed Care – PPO

## 2017-04-02 DIAGNOSIS — E1159 Type 2 diabetes mellitus with other circulatory complications: Secondary | ICD-10-CM | POA: Diagnosis not present

## 2017-04-02 DIAGNOSIS — I422 Other hypertrophic cardiomyopathy: Secondary | ICD-10-CM | POA: Diagnosis not present

## 2017-04-02 DIAGNOSIS — R002 Palpitations: Secondary | ICD-10-CM | POA: Diagnosis not present

## 2017-04-02 DIAGNOSIS — I421 Obstructive hypertrophic cardiomyopathy: Secondary | ICD-10-CM | POA: Diagnosis not present

## 2017-04-02 DIAGNOSIS — I214 Non-ST elevation (NSTEMI) myocardial infarction: Secondary | ICD-10-CM | POA: Diagnosis not present

## 2017-04-02 DIAGNOSIS — R011 Cardiac murmur, unspecified: Secondary | ICD-10-CM

## 2017-04-02 DIAGNOSIS — I10 Essential (primary) hypertension: Secondary | ICD-10-CM

## 2017-04-02 DIAGNOSIS — I4891 Unspecified atrial fibrillation: Secondary | ICD-10-CM | POA: Diagnosis not present

## 2017-04-02 DIAGNOSIS — I480 Paroxysmal atrial fibrillation: Secondary | ICD-10-CM | POA: Diagnosis not present

## 2017-04-02 LAB — BASIC METABOLIC PANEL
Anion gap: 10 (ref 5–15)
BUN: 17 mg/dL (ref 6–20)
CALCIUM: 9.1 mg/dL (ref 8.9–10.3)
CO2: 28 mmol/L (ref 22–32)
CREATININE: 1.02 mg/dL — AB (ref 0.44–1.00)
Chloride: 98 mmol/L — ABNORMAL LOW (ref 101–111)
GFR calc non Af Amer: >60 mL/min (ref >60)
Glucose, Bld: 249 mg/dL — ABNORMAL HIGH (ref 65–99)
Potassium: 4 mmol/L (ref 3.5–5.1)
Sodium: 136 mmol/L (ref 135–145)

## 2017-04-02 LAB — CBC
HCT: 36.5 % (ref 36.0–46.0)
Hemoglobin: 11.7 g/dL — ABNORMAL LOW (ref 12.0–15.0)
MCH: 25.3 pg — ABNORMAL LOW (ref 26.0–34.0)
MCHC: 32.1 g/dL (ref 30.0–36.0)
MCV: 78.8 fL (ref 78.0–100.0)
PLATELETS: 231 10*3/uL (ref 150–400)
RBC: 4.63 MIL/uL (ref 3.87–5.11)
RDW: 13.8 % (ref 11.5–15.5)
WBC: 10.9 10*3/uL — AB (ref 4.0–10.5)

## 2017-04-02 LAB — ECHOCARDIOGRAM COMPLETE
Height: 66 in
Weight: 2672 oz

## 2017-04-02 LAB — GLUCOSE, CAPILLARY
Glucose-Capillary: 246 mg/dL — ABNORMAL HIGH (ref 65–99)
Glucose-Capillary: 223 mg/dL — ABNORMAL HIGH (ref 65–99)
Glucose-Capillary: 162 mg/dL — ABNORMAL HIGH (ref 65–99)
Glucose-Capillary: 214 mg/dL — ABNORMAL HIGH (ref 65–99)

## 2017-04-02 MED ORDER — METOPROLOL SUCCINATE ER 25 MG PO TB24: 25.0000 mg | ORAL_TABLET | Freq: Every day | ORAL | Status: DC

## 2017-04-02 MED ORDER — METOPROLOL TARTRATE 12.5 MG HALF TABLET
12.5000 mg | ORAL_TABLET | Freq: Once | ORAL | Status: AC
  Administered 2017-04-02: 12.5 mg via ORAL
  Filled 2017-04-02: qty 1

## 2017-04-02 MED ORDER — SODIUM CHLORIDE 0.9 % IV SOLN
INTRAVENOUS | Status: AC
  Administered 2017-04-02: 13:00:00 via INTRAVENOUS

## 2017-04-02 NOTE — Progress Notes (Addendum)
Subjective: [name] was seen sitting up in her bed this morning. She stated that she was doing well and that she no longer has any chest pain or palpitations. She has been able to walk around without difficulty.   Objective:  Vital signs in last 24 hours: Vitals:   04/01/17 1430 04/01/17 1558 04/01/17 2043 04/02/17 0516  BP: 104/68 108/79 115/62 114/66  Pulse: 95 (!) 102 90 79  Resp: 18     Temp:  97.7 F (36.5 C) 98.6 F (37 C) 98.7 F (37.1 C)  TempSrc:  Oral Oral Oral  SpO2: 100% 100% 99% 100%  Weight:  168 lb 12.8 oz (76.6 kg)  167 lb (75.8 kg)  Height:  5\' 6"  (1.676 m)     Physical Exam  Constitutional: She appears well-developed and well-nourished. No distress.  HENT:  Head: Normocephalic and atraumatic.  Eyes: Conjunctivae are normal.  Cardiovascular: Normal rate, regular rhythm and intact distal pulses.  Murmur (3/4 systolic murmur ) heard. Respiratory: Effort normal and breath sounds normal. No respiratory distress. She has no wheezes.  GI: Soft. Bowel sounds are normal. She exhibits no distension. There is no tenderness.  Neurological: She is alert.  Skin: She is not diaphoretic. No erythema.  Psychiatric: She has a normal mood and affect. Her behavior is normal. Judgment and thought content normal.   Assessment/Plan:  Palpitations and mild troponin elevation in setting of Paroxysmal Atrial Fibrillation The patient is in normal sinus rhythm. Her heartrates have been normal today. The patient's palpitations maybe related to the patient converting to A. fib with RVR prior to admission.   -Continue telemetry -Troponin 0.12,0.13,0.11 -Continue home Xarelto 20 mg daily  Hypertrophic obstructive cardiomyopathy The patient had an audible systolic murmur today and she mentioned that her 2 sisters passed away at young ages 7(25 and 47 yrs old). One sister passed away during childbirth and the other for unknown reason after going to hospital.   Repeat echo was done  today which showed lvef 75%, g1dd, lvot gradient suggestive of HOCM, moderate LVH, calcified mitral annulus, IVC<1.2cm that collapses spontaneously suggesting volume depletion.   -Cardiology consulted regarding whether further workup or management needs to be done -NS 11150ml/hr  T2DM Patient's last Hgb A1c=10.9 (03/17/17). She is on Metformin 1000 mg BID at home.   -SSI -Pending Urine Microalb/Cr ratio  Essential HTN The patient's blood pressure has been ranging 101-126/64-82. She is on Lisinopril-HCTZ 40-25 mg daily and Amlodipine 10 mg daily at home.  - Continue Amlodipine 10 mg daily - Holding Lisinopril-HCTZ due to aki (0.8 to 1.1)  Dispo: Anticipated discharge in approximately today.   Lorenso Courierhundi, Vahini, MD 04/02/2017, 12:57 PM Pager: [phone]  Internal Medicine Attending Attestation:   I have seen and evaluated this patient and I have discussed the plan of care with the house staff. Please see their note for complete details. I concur with their findings with the following additions/corrections:   Admitted for palpitations, resolved after presentation. Sinus rhythm overnight except for brief run of atrial flutter vs multiple PACs, also with intermittent sinus tachycardia, also asymptomatic. Today on exam has 3/6 systolic murmur heard best at LLSB, does not radiate to carotids, decreases with lying flat and raising legs.   Repeated TTE even though just had one as prior TTE does not explain murmur and concern for variable LVOT obstruction. TTE today shows HCM with LVOT obstruction, also appears somewhat volume depleted. Hypovolemia potentially related to poor PO intake after recent cholecystectomy. Will give  1 L of additional IV fluids and discuss case with cardiology. May benefit from beta blocker for HCM and rate control in pAF. Discharge depends on recommendations from cardiology.  Concerning that she presented with palpitations in context of HCM, which raises concern for VT.  Will continue to watch on telemetry and defer to cardiology for further recommendations. She also may be symptomatic from her HCM as she reports severe dyspnea on exertion after 5 minutes of exercise, so may need stress test to further evaluate.  Anne Shutter, MD 04/02/2017, 4:34 PM

## 2017-04-02 NOTE — Progress Notes (Signed)
Results for Whitman HeroCHEA, Nautika T (MRN [id number]) as of 04/02/2017 13:00  Ref. Range 04/01/2017 11:59 04/01/2017 14:01 04/01/2017 22:04 04/02/2017 07:49 04/02/2017 11:13  Glucose-Capillary Latest Ref Range: 65 - 99 mg/dL 409189 (H) 811189 (H) 914220 (H) 246 (H) 223 (H)  Noted that blood sugars have continued to be greater than 180 mg/dl.  Recommend adding Lantus 12 units daily if blood sugars continue to be elevated.   Smith MinceKendra Zeriyah Wain RN BSN CDE Diabetes Coordinator Pager: [phone]  8am-5pm

## 2017-04-02 NOTE — Consult Note (Addendum)
Cardiology Consultation:   Patient ID: PAULETT KAUFHOLD; [id number]; 08-29-1970   Admit date: 04/01/2017 Date of Consult: 04/02/2017  Primary Care Provider: Burna Cash, MD Primary Cardiologist: Dr. Eden Emms   Patient Profile:   [name] is a 47 y.o. female with a hx of  Paroxysmal atrial fibrillation dating back to 2013 (she treated with flecainide and Xarelto for chadsvas score of 3), hypertension, diabetes basal hypertrophy of symptoms, noncompliance due to cost and Bell's palsy who is being seen today for the evaluation of chest pain and palpitation at the request of Dr. Sandre Kitty.  Previously noncompliant with Xarelto and unable to follow-up for TEE due to cost and loss of insurance.  Last seen by Dr. Eden Emms March 2016.  Since then, her cardiac care managed by Medicare provider.  She continue to take Xarelto only.  History of Present Illness:   [name] had a cholecystectomy last Thursday, 03/25/17.  She is resume her Xarelto on the 24th.  Wednesday morning patient noted palpitation.  Her palpitation lasted all day intermittently from a few minutes to a few hours.  Associated with dizziness.  No chest pain or dyspnea.  Her palpitation got worse in the evening with sharp pain leading to ER presentation.  Her palpitation is different from prior episode of atrial fibrillation.  EKG showed sinus tachycardia with repolarization abnormality.  CTA negative for PE.  Troponin minimally elevated to 0.13.  She was admitted for further evaluation.  He only had one brief episode of palpitation yesterday afternoon lasting for a few seconds.  At that time telemetry showed atrial tachycardia at 1609.  No recurrent chest pain or shortness of breath.  Troponin with flat trend.  Of note she was admitted from 3/7-03/12/2017 for Bell's palsy for which she completed a course of prednisone and valacyclovir.  She denies orthopnea, PND, syncope, lower extremity edema or melena.  Her youngest sister  died during child birth at age 48.  Other sister died at age 77, unknown reason (patient only knew that she was sick and passed suddenly).  Patient complains of intermittent dizziness with extreme exertion that relieves with drinking water.  Denies history of syncope.  Echo 04/02/16 Study Conclusions  - Left ventricle: The cavity size was normal. Wall thickness was   increased in a pattern of moderate LVH. There was severe focal   basal hypertrophy of the septum. SAM is noted. Rest LVOT gradient   of 29 mmHg - suggestive of HOCM. The estimated ejection fraction   was 75%. Doppler parameters are consistent with abnormal left   ventricular relaxation (grade 1 diastolic dysfunction). The E/e&'   ratio is >15, suggesting elevated LV filling pressure. LVOT peak   gradient (S): 29 mm Hg. - Mitral valve: Thickened leaflet tips. Mild to moderate   regurgitation. Calcified annulus. - Left atrium: The atrium was normal in size. - Inferior vena cava: The IVC measures <1.2 cm and collapses   spontaneously, suggesting volume depletion and a low RA pressure   of 3 mmHg.  Impressions:  - Compared to a recent study, the quality of this study is   improved. There are features are now more consistent with HOCM -   including systolic anterior motion of the mitral valve (SAM),   elevated LV filling pressure and a rest LVOT gradient of 29 mmHg.   There is severe focal basal septal hypertrophy. There is   resultant mild to moderate MR, due to [facility] as well as mitral   leaflet tip  thickening and posterior mitral annular   calcification. The LV is hyperdynamic with LVEF 75%. The IVC is   small, suggesting volume depletion may be a contributing factor.   Past Medical History:  Diagnosis Date  . Bell's palsy 03/2017  . Gallstones   . Gestational diabetes mellitus (GDM) 2005; 2006  . Headache    "monthly" (03/12/2017)  . History of chest pain   . History of prolonged Q-T interval on ECG    a. 09/2012  adm: QTC 506.  Marland Kitchen Hypertension   . PAF (paroxysmal atrial fibrillation) (HCC) 2012   a. 07/2011 ED visit with AF-> converted with flecainide, never followed-up;  b. 05/2012 Recurrent Afib -echo: EF 65-60%, mildly dil LA. Placed on Xarelto/daily Flecainide. c. 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  . Small bowel obstruction (HCC) 09/2010   resolved  . Type II diabetes mellitus (HCC)    a. 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 b. 05/2012 A1c 12.6.    Past Surgical History:  Procedure Laterality Date  . CESAREAN SECTION  2005; 2006  . CHOLECYSTECTOMY N/A 03/25/2017   Procedure: LAPAROSCOPIC CHOLECYSTECTOMY;  Surgeon: Kinsinger, De Blanch, MD;  Location: [facility];  Service: General;  Laterality: N/A;  . HERNIA REPAIR    . VENTRAL HERNIA REPAIR  02/05/2005   with mesh      Inpatient Medications: Scheduled Meds: . amLODipine  10 mg Oral Daily  . aspirin EC  81 mg Oral Daily  . insulin aspart  0-9 Units Subcutaneous TID WC  . rivaroxaban  20 mg Oral Q supper  . sodium chloride flush  3 mL Intravenous Q12H   Continuous Infusions: . sodium chloride 150 mL/hr at 04/02/17 1235   PRN Meds:   Allergies:   No Known Allergies  Social History:   Social History   Socioeconomic History  . Marital status: Divorced    Spouse name: Not on file  . Number of children: 2  . Years of education: Not on file  . Highest education level: Not on file  Occupational History  . Occupation: Equities trader: [employer]  Social Needs  . Financial resource strain: Not on file  . Food insecurity:    Worry: Not on file    Inability: Not on file  . Transportation needs:    Medical: Not on file    Non-medical: Not on file  Tobacco Use  . Smoking status: Never Smoker  . Smokeless tobacco: Never Used  Substance and Sexual Activity  . Alcohol use: Never    Frequency: Never  . Drug use: Never  . Sexual activity: Not Currently    Partners:  Male  Lifestyle  . Physical activity:    Days per week: Not on file    Minutes per session: Not on file  . Stress: Not on file  Relationships  . Social connections:    Talks on phone: Not on file    Gets together: Not on file    Attends religious service: Not on file    Active member of club or organization: Not on file    Attends meetings of clubs or organizations: Not on file    Relationship status: Not on file  . Intimate partner violence:    Fear of current or ex partner: Not on file    Emotionally abused: Not on file    Physically abused: Not on file    Forced sexual activity: Not on file  Other Topics Concern  . Not on file  Social History Narrative   ** Merged History Encounter **       ** Data from: 11/07/10 Enc Dept: [facility]   Pt has 6 sisters but doe not know their health history or status.      Mother and father unknown      Pt is an immigrant from [place], lives in [place], [place] by herself           ** Data from: 09/19/13 Enc Dept: [facility]   [place] in [place] with significant other.  Works @ [employer] in [employer].  Does not routinely exercise.    Family History:    Family History  Problem Relation Age of Onset  . Other Father        killed in [place] war when he was in his 7450's  . Asthma Mother        alive in her 3160's  . Other Sister        A & W  . Other Sister        A & W  . Heart disease Neg Hx      ROS:  Please see the history of present illness.  All other ROS reviewed and negative.     Physical Exam/Data:   Vitals:   04/01/17 1558 04/01/17 2043 04/02/17 0516 04/02/17 1448  BP: 108/79 115/62 114/66 135/70  Pulse: (!) 102 90 79 100  Resp:      Temp: 97.7 F (36.5 C) 98.6 F (37 C) 98.7 F (37.1 C) 99.3 F (37.4 C)  TempSrc: Oral Oral Oral Oral  SpO2: 100% 99% 100% 96%  Weight: 168 lb 12.8 oz (76.6 kg)  167 lb (75.8 kg)   Height: 5\' 6"  (1.676 m)       Intake/Output Summary (Last 24 hours) at  04/02/2017 1519 Last data filed at 04/02/2017 1235 Gross per 24 hour  Intake 2340 ml  Output -  Net 2340 ml   Filed Weights   03/31/17 2215 04/01/17 1558 04/02/17 0516  Weight: 170 lb (77.1 kg) 168 lb 12.8 oz (76.6 kg) 167 lb (75.8 kg)   Body mass index is 26.95 kg/m.  General:  Well nourished, well developed, in no acute distress HEENT: normal Lymph: no adenopathy Neck: no JVD Endocrine:  No thryomegaly Vascular: No carotid bruits; FA pulses 2+ bilaterally without bruits  Cardiac:  normal S1, S2; RRR; systolic murmur Lungs:  clear to auscultation bilaterally, no wheezing, rhonchi or rales  Abd: soft, nontender, no hepatomegaly  Ext: no edema Musculoskeletal:  No deformities, BUE and BLE strength normal and equal Skin: warm and dry  Neuro:  CNs 2-12 intact, no focal abnormalities noted Psych:  Normal affect   EKG:  The EKG was personally reviewed and demonstrates:   04/02/17: Normal sinus rhythm at controlled ventricular rate 03/31/17: Sinus tachycardia at rate of 107 bpm with nonspecific ST changes Telemetry:  Telemetry was personally reviewed and demonstrates: Sinus rhythm at controlled ventricular rate of 80s with 1 brief episode of tachycardia at 130 yesterday. Relevant CV Studies: As above  Laboratory Data:  Chemistry Recent Labs  Lab 03/31/17 2223 04/01/17 0827 04/02/17 0406  NA 135 133* 136  K 3.5 3.5 4.0  CL 90* 94* 98*  CO2 27 27 28   GLUCOSE 259* 245* 249*  BUN 23* 17 17  CREATININE 1.14* 1.02* 1.02*  CALCIUM 9.5 9.0 9.1  GFRNONAA 57* >60 >60  GFRAA >60 >60 >  60  ANIONGAP 18* 12 10    No results for input(s): PROT, ALBUMIN, AST, ALT, ALKPHOS, BILITOT in the last 168 hours. Hematology Recent Labs  Lab 03/31/17 2223 04/02/17 0406  WBC 15.1* 10.9*  RBC 4.98 4.63  HGB 13.0 11.7*  HCT 39.2 36.5  MCV 78.7 78.8  MCH 26.1 25.3*  MCHC 33.2 32.1  RDW 13.9 13.8  PLT 313 231   Cardiac Enzymes Recent Labs  Lab 04/01/17 0827 04/01/17 1614  04/01/17 2214  TROPONINI 0.12* 0.13* 0.11*    Recent Labs  Lab 03/31/17 2319 04/01/17 0332  TROPIPOC 0.15* 0.13*    BNPNo results for input(s): BNP, PROBNP in the last 168 hours.  DDimer No results for input(s): DDIMER in the last 168 hours.  Radiology/Studies:  Dg Chest 2 View  Result Date: 03/31/2017 CLINICAL DATA:  47 year old female with tachycardia, palpitations since this morning. History of atrial fibrillation. EXAM: CHEST - 2 VIEW COMPARISON:  Chest radiographs 03/11/2017 and earlier. FINDINGS: Cardiac and mediastinal contours are stable and within normal limits. Visualized tracheal air column is within normal limits. Stable lung volumes. Both lungs are clear. No pneumothorax or pleural effusion. No acute osseous abnormality identified. Negative visible bowel gas pattern. IMPRESSION: Negative.  No acute cardiopulmonary abnormality. Electronically Signed   By: Odessa Fleming M.D.   On: 03/31/2017 22:45   Ct Angio Chest Pe W And/or Wo Contrast  Result Date: 04/01/2017 CLINICAL DATA:  47 y/o F; feels heart racing. PE suspected, high pretest prob. EXAM: CT ANGIOGRAPHY CHEST WITH CONTRAST TECHNIQUE: Multidetector CT imaging of the chest was performed using the standard protocol during bolus administration of intravenous contrast. Multiplanar CT image reconstructions and MIPs were obtained to evaluate the vascular anatomy. CONTRAST:  ISOVUE-370 IOPAMIDOL (ISOVUE-370) INJECTION 76% COMPARISON:  03/31/2017 chest radiograph FINDINGS: Cardiovascular: Satisfactory opacification of the pulmonary arteries to the segmental level. Respiratory motion artifact. No pulmonary embolus identified. Normal heart size. No pericardial effusion. Mediastinum/Nodes: No enlarged mediastinal, hilar, or axillary lymph nodes. Thyroid gland, trachea, and esophagus demonstrate no significant findings. Lungs/Pleura: Perifissural nodules on the right minor fissure, likely intrapulmonary lymph nodes. No consolidation,  effusion, or pneumothorax. Upper Abdomen: Nonspecific punctate calcification within the liver. Musculoskeletal: No chest wall abnormality. No acute or significant osseous findings. Review of the MIP images confirms the above findings. IMPRESSION: 1. Respiratory motion artifact.  No pulmonary embolus identified. 2. Otherwise unremarkable CTA of the chest. Electronically Signed   By: Mitzi Hansen M.D.   On: 04/01/2017 04:22    Assessment and Plan:   1. HOCM -Previously concern on prior evaluation in 2013/2014. -Echocardiogram today showed here are features are now more consistent with HOCM -   including systolic anterior motion of the mitral valve (SAM),   elevated LV filling pressure and a rest LVOT gradient of 29 mmHg.   There is severe focal basal septal hypertrophy. There is   resultant mild to moderate MR, due to [facility] as well as mitral   leaflet tip thickening and posterior mitral annular   calcification. The LV is hyperdynamic with LVEF 75%. The IVC is   small, suggesting volume depletion may be a contributing factor. -Avoid dehydration.  Slow down her heart rate.  Likely  will need cardiac MRI. -Previously patient has noted improvement with drinking water during episode of dizziness while extreme exertion.  2.  Paroxysmal atrial fibrillation/Palpitatons -Her episode of palpitation this admission is different.  CHADVASCs of 3 for hypertension, diabetes and sex.  Continue Xarelto for anticoagulation.  Consider adding beta-blocker.  3.  Hypertension -The patient currently stable on amlodipine 10 mg daily.  Home lisinopril and hydrochlorothiazide on hold>>> would not restart.  Need to avoid dehydration. May consider low dose lisinopril with kidney protection.   4.  Diabetes - A1c 10.9. Management per primary team.  For questions or updates, please contact [facility] Please consult [email or web address] for contact info under Cardiology/STEMI.   Lorelei Pont, [place]   04/02/2017 3:19 PM   Personally seen and examined. Agree with above.  47 year old female with hypertrophic obstructive cardiomyopathy by echocardiogram.  Currently she is feeling better, no shortness of breath, no chest pain.  Telemetry personally reviewed shows brief paroxysmal atrial tachycardia.  No ventricular tachycardia.  I do not see any evidence of atrial fibrillation currently but she does have paroxysmal atrial fibrillation in the past.  Exam: Alert and oriented x3 no acute distress with loud 3/6 systolic murmur heard at left lower sternal border.  No JVD.  Lungs are clear.  No edema.  Pleasant.  Lab work personally reviewed as above.  EKG personally reviewed shows LVH with repolarization abnormalities.  Assessment and plan:  Hypertrophic obstructive cardiomyopathy -Systolic anterior motion of the mitral valve noted on echocardiogram, moderate LVH.  It may be beneficial as an outpatient to check cardiac MRI to further elucidate diagnosis however current echocardiogram does seem to demonstrate typical HOCM-like pattern. -Plan is to add beta-blocker.  Current heart rate is 96 bpm.  We will continue to increase her beta-blocker to hopefully achieve heart rates in the 50s-60s.  If necessary, discontinue other antihypertensives to allow for further beta blockade. -As outpatient would recommend exercise treadmill test -Telemetry should suffice for any evidence of ventricular tachycardia.  She has not had any unexplained syncopal episodes.  She denies any first-degree relatives with sudden death.  Diabetes with hypertension  - continue to treat per primary team  Donato Schultz, MD

## 2017-04-02 NOTE — Progress Notes (Signed)
  Echocardiogram 2D Echocardiogram has been performed.  Bertie Mcconathy T Jaedyn Lard 04/02/2017, 11:03 AM

## 2017-04-03 DIAGNOSIS — Z9049 Acquired absence of other specified parts of digestive tract: Secondary | ICD-10-CM | POA: Diagnosis not present

## 2017-04-03 DIAGNOSIS — I10 Essential (primary) hypertension: Secondary | ICD-10-CM | POA: Diagnosis not present

## 2017-04-03 DIAGNOSIS — E119 Type 2 diabetes mellitus without complications: Secondary | ICD-10-CM | POA: Diagnosis not present

## 2017-04-03 DIAGNOSIS — I480 Paroxysmal atrial fibrillation: Secondary | ICD-10-CM | POA: Diagnosis not present

## 2017-04-03 DIAGNOSIS — I421 Obstructive hypertrophic cardiomyopathy: Secondary | ICD-10-CM

## 2017-04-03 DIAGNOSIS — Z7901 Long term (current) use of anticoagulants: Secondary | ICD-10-CM | POA: Diagnosis not present

## 2017-04-03 DIAGNOSIS — Z7984 Long term (current) use of oral hypoglycemic drugs: Secondary | ICD-10-CM | POA: Diagnosis not present

## 2017-04-03 DIAGNOSIS — R011 Cardiac murmur, unspecified: Secondary | ICD-10-CM | POA: Diagnosis not present

## 2017-04-03 DIAGNOSIS — E1159 Type 2 diabetes mellitus with other circulatory complications: Secondary | ICD-10-CM | POA: Diagnosis not present

## 2017-04-03 LAB — GLUCOSE, CAPILLARY: Glucose-Capillary: 189 mg/dL — ABNORMAL HIGH (ref 65–99)

## 2017-04-03 MED ORDER — SODIUM CHLORIDE 0.9 % IV SOLN
INTRAVENOUS | Status: AC
  Administered 2017-04-03: 10:00:00 via INTRAVENOUS

## 2017-04-03 MED ORDER — METOPROLOL SUCCINATE ER 50 MG PO TB24: 50.0000 mg | ORAL_TABLET | Freq: Every day | ORAL | 1 refills | Status: DC

## 2017-04-03 MED ORDER — METOPROLOL SUCCINATE ER 50 MG PO TB24
50.0000 mg | ORAL_TABLET | Freq: Every day | ORAL | Status: DC
  Administered 2017-04-03: 50 mg via ORAL
  Filled 2017-04-03: qty 1

## 2017-04-03 NOTE — Progress Notes (Signed)
Pt for d/c home. No changes initial AM assessment at this time.D/c instruction given. Pt verbalized understanding. During the d/c process pt mentioned that she is driving home.    Pts denies SOB, Chest pain and Tightness.  Page in to MD to discuss if patient can drive herself home awaiting call back

## 2017-04-03 NOTE — Progress Notes (Signed)
   Subjective: [name] was seen and evaluated at bedside. She reports feeling "much better" since yesterday. No further palpitations. Discussed diagnosis of HOCM and management with Metoprolol and rehydration.   Objective:  Vital signs in last 24 hours: Vitals:   04/02/17 1448 04/02/17 2051 04/03/17 0424 04/03/17 0918  BP: 135/70 122/72 140/76 137/80  Pulse: 100 74 88 92  Resp:      Temp: 99.3 F (37.4 C) 98.3 F (36.8 C) 98.5 F (36.9 C)   TempSrc: Oral Oral Oral   SpO2: 96% 100% 100%   Weight:   170 lb 9.6 oz (77.4 kg)   Height:       Physical Exam  Constitutional: She appears well-developed and well-nourished. No distress.  HENT:  Head: Normocephalic and atraumatic.  Eyes: Conjunctivae are normal.  Cardiovascular: Normal rate, regular rhythm and intact distal pulses.  Murmur (3/6 systolic murmur) heard. Respiratory: Effort normal and breath sounds normal. No respiratory distress. She has no wheezes.  GI: Soft. Bowel sounds are normal. She exhibits no distension. There is no tenderness.  Neurological: She is alert.  Skin: She is not diaphoretic. No erythema.  Psychiatric: She has a normal mood and affect. Her behavior is normal. Judgment and thought content normal.   Assessment/Plan:   Paroxysmal Atrial Fibrillation She remains in NSR without RVR. Continuing home Xarelto.   Hypertrophic obstructive cardiomyopathy ECHO yesterday showed LVEF 75%, g1dd and LVOT c/w HOCM. Moderate LVH as well as collapsible IVC suggesting dehydration. She was given IVF and cardiology was consulted who started Metoprolol. Goal HR 50-60's. She maintained in the 70's overnight with dose increase this morning to 50mg  daily. 2 sisters did pass away unexpectedly at a young age 88(25 and 3531 yrs), although one passed during childbirth and the other shortly after presenting to the ED.  -Appreciate cardiology recommendations -Will discharge with Toprol XL 50mg  daily, with plans to up-titrate to goal HR  50-60's.  -Patient educated on importance of staying hydrated -Have discontinued Amlodipine to allow for better beta-blockade and HCTZ/Lisinopril have also been DC. -She will follow-up with cardiology outpatient  T2DM Patient's last Hgb A1c=10.9 (03/17/17). She is on Metformin 1000 mg BID at home which is being continued at dc. -Could consider starting Lisinopril 2.5mg  at future visits for renal protection  Essential HTN Holding Amlodipine, HCTZ and Lisinopril according to recommendations. BP has been stable.  Dispo: Discharge today.  Louie Flenner, DO 04/03/2017, 9:45 AM Pager: [phone]

## 2017-04-03 NOTE — Discharge Summary (Signed)
Name: [name] MRN: [id number] DOB: 1970-03-11 47 y.o. PCP: Burna CashSantos-Sanchez, Idalys, MD  Date of Admission: 04/01/2017  2:29 AM Date of Discharge: 04/03/17 Attending Physician: Carlynn PurlHOFFMAN, ERIK DO  Discharge Diagnosis: 1. HOCM 2. Atrial Fibrillation 3. Hypertension 4. Recent Lap Chole  Active Problems:   Atrial fibrillation (HCC)   Hypertension associated with diabetes (HCC)   Diabetes mellitus (HCC)   Palpitations   Discharge Medications: Allergies as of 04/03/2017   No Known Allergies     Medication List    STOP taking these medications   amLODipine 10 MG tablet Commonly known as:  NORVASC   HYDROcodone-acetaminophen 5-325 MG tablet Commonly known as:  NORCO/VICODIN   lisinopril-hydrochlorothiazide 20-12.5 MG tablet Commonly known as:  ZESTORETIC     TAKE these medications   artificial tears ointment Place into the left eye as needed. Please use ointment daily at bedtime before sleeping. What changed:    how much to take  when to take this  additional instructions   aspirin EC 81 MG tablet Take 81 mg by mouth daily.   ibuprofen 800 MG tablet Commonly known as:  ADVIL,MOTRIN Take 1 tablet (800 mg total) by mouth every 8 (eight) hours as needed. What changed:  reasons to take this   metFORMIN 1000 MG tablet Commonly known as:  GLUCOPHAGE Take 1 tablet (1,000 mg total) by mouth 2 (two) times daily with a meal.   metoprolol succinate 50 MG 24 hr tablet Commonly known as:  TOPROL-XL Take 1 tablet (50 mg total) by mouth daily. Take with or immediately following a meal. Start taking on:  04/04/2017   Propylene Glycol 0.6 % Soln Commonly known as:  SYSTANE COMPLETE Apply 1 drop to eye QID.   rivaroxaban 20 MG Tabs tablet Commonly known as:  XARELTO Take 1 tablet (20 mg total) by mouth daily with supper.       Disposition and follow-up:   [name] was [facility] in [facility].  At the hospital  follow up visit please address:  1.  HOCM: Goal HR 50-60. Please ensure she is compliant with her Toprol XL 50mg  daily. She may need increased dose to maintain above HR goal. Please also ensure patient has stopped her other BP medications and that she is staying hydrated.   2.  Labs / imaging needed at time of follow-up: None  3.  Pending labs/ test needing follow-up: None  Follow-up Appointments: Follow-up Information    Burna CashSantos-Sanchez, Idalys, MD. Schedule an appointment as soon as possible for a visit in 1 week(s).   Specialty:  Internal Medicine Contact information: [address] [place] [address] [phone]        Jake BatheSkains, Mark C, MD. Schedule an appointment as soon as possible for a visit in 2 week(s).   Specialty:  Cardiology Contact information: [address] [place] [address] [phone]           Hospital Course by problem list: Principal Problem:   HOCM (hypertrophic obstructive cardiomyopathy) (HCC) Active Problems:   Atrial fibrillation (HCC)   Hypertension associated with diabetes (HCC)   Diabetes mellitus (HCC)   Palpitations   1. HOCM with Palpitations [name] is a very-pleasant 47 year old female with history of atrial fibrillation on xarelto, hypertension, type 2 diabetes, chronic calculus cholecystitis s/p lap chole 3/21 and recent Bells Palsy (completed prednisone and valacyclovir) who presented 3/28 with 1-2 day history of palpitations. Denied any chest  pain, SOB, LOC and assumed symptoms were associated with holding Xarelto for her recent procedure. EKG was without acute changes but she did have mild troponin elevation to 0.13. She was admitted for ACS rule-out per cardiology recommendations. During admission there were no indications for acute ischemia and she was monitored on telemetry which detected intermittent sinus tachycardia.  A murmur was appreciated, which had only intermittently been recorded in the past. ECHO  consistent with HOCM and volume depletion. Cardiology was consulted who started Metoprolol with HR goal 50's-60's, discontinued Amlodipine, Lisinopril and HCTZ and also encouraged avoiding dehydration. In addition to the above medication changes, she was rehydrated with gentle IVF and patient had significant improvement of her symptoms. She was discharged home with Metoprolol 50mg  daily with close outpatient follow-up, and will likely need her Metoprolol increased to 100mg  daily.  2. Atrial Fibrillation  She maintained sinus rhythm during admission and had no recorded episodes of AFib with RVR. She was maintained on Xarelto.   3. Essential HTN She was started on Metoprolol 50mg  and Amlodipine/HCTZ and Lisinopril were discontinued. Her BP was stable despite these changes. She might benefit from re-addition of low-dose ACE-I for renal protection at some point, but do need to avoid any agents which could dehydrate her.   4. Recent Lap Chole Suspect dehydration related to recent procedure related to her palpitations in setting of HOCM. She tolerated PO intake well and had no complaints related to her surgery.   Discharge Vitals:   BP 137/80   Pulse 92   Temp 98.5 F (36.9 C) (Oral)   Resp 18   Ht 5\' 6"  (1.676 m)   Wt 170 lb 9.6 oz (77.4 kg)   LMP 10/01/2006   SpO2 100%   BMI 27.54 kg/m   Pertinent Labs, Studies, and Procedures:  ECHO 04/02/17: Study Conclusions  - Left ventricle: The cavity size was normal. Wall thickness was   increased in a pattern of moderate LVH. There was severe focal   basal hypertrophy of the septum. SAM is noted. Rest LVOT gradient   of 29 mmHg - suggestive of HOCM. The estimated ejection fraction   was 75%. Doppler parameters are consistent with abnormal left   ventricular relaxation (grade 1 diastolic dysfunction). The E/e&'   ratio is >15, suggesting elevated LV filling pressure. LVOT peak   gradient (S): 29 mm Hg. - Mitral valve: Thickened leaflet tips.  Mild to moderate   regurgitation. Calcified annulus. - Left atrium: The atrium was normal in size. - Inferior vena cava: The IVC measures <1.2 cm and collapses   spontaneously, suggesting volume depletion and a low RA pressure   of 3 mmHg.  Impressions:  - Compared to a recent study, the quality of this study is   improved. There are features are now more consistent with HOCM -   including systolic anterior motion of the mitral valve (SAM),   elevated LV filling pressure and a rest LVOT gradient of 29 mmHg.   There is severe focal basal septal hypertrophy. There is   resultant mild to moderate MR, due to [facility] as well as mitral   leaflet tip thickening and posterior mitral annular   calcification. The LV is hyperdynamic with LVEF 75%. The IVC is   small, suggesting volume depletion may be a contributing factor.  SignedNoemi Chapel, DO 04/03/2017, 9:55 AM   Pager: [phone]

## 2017-04-03 NOTE — Discharge Instructions (Signed)
[name], you were admitted for palpitations.  This is most likely because you were dehydrated. You are more likely to experience these symptoms when you are dehydrated due to a heart condition you most likely have had for a long time. This is called Hypertrophic Obstructive Cardiomyopathy (HOCM). You were seen by the heart doctors who made several changes to your medications, including adding a medicine called Metoprolol. This medicine slows your heart rate down. They also discontinued your Amlodipine, Lisinopril and HCTZ.  Please take Metoprolol every morning, starting tomorrow 3/31.  Please follow-up in the internal medicine clinic within the next week to see how you are doing on this medication.  You will also need to follow-up with the heart doctors as well.     Hypertrophic Cardiomyopathy Hypertrophic cardiomyopathy (HCM) is a heart condition in which part of the heart muscle gets too thick. The condition can cause a dangerous and abnormal heart rhythm. It can also weaken the heart over time. The heart is divided into four chambers. The thickening usually affects the pumping chamber on the lower left (left ventricle). HCM may also affect the valve that lets blood flow out of the left ventricle (mitral valve). Symptoms often begin at about age 33. What are the causes? This condition is usually caused by abnormal genes that control heart muscle growth. These abnormal genes are passed down through families (are inherited). What increases the risk? This condition is more likely to develop in people with a family history of HCM. What are the signs or symptoms? Symptoms of this condition include:  Shortness of breath, especially after exercising or lying down.  Chest pain.  Dizziness.  Fatigue.  Irregular or fast heart rate.  Fainting, especially after physical activity.  How is this diagnosed? This condition is diagnosed based on the results of a physical exam that involves checking  for an abnormal heart sound (heart murmur). Tests may be done, including:  An electrocardiogram (ECG). This test records your heart's electrical activity.  An echocardiogram. This test can show whether your left ventricle is enlarged and whether it fills slowly.  A Doppler test. This test shows irregular blood flow and pressure differences inside the heart.  A chest X-ray. This test can show whether your heart is enlarged.  An MRI.  How is this treated? Treatment for HCM depends on how severe your symptoms are. There are several options for treatment, including.  Medicine. Medicines can be given to: ? Reduce the workload of your heart. ? Lower your blood pressure. ? Thin your blood and prevent clots.  A device. Devices that can be used to treat this condition include: ? A pacemaker. This device helps to control your heartbeat. ? A defibrillator. This device restores a normal heart rhythm.  Surgery. This may include a procedure to: ? Inject alcohol into the small blood vessels that supply your heart muscle (alcohol septal ablation). This is done during a procedure called cardiac catheterization. The goal is to cause the muscle to become thinner. ? Remove part of the wall that divides the right and left sides of the heart (septum). ? Replace the mitral valve.  Follow these instructions at home:  Avoid strenuous exercise and activities, like heavy lifting or shoveling snow.  Make sure the members of your household know how to do CPR in case of an emergency.  Take over-the-counter and prescription medicines only as told by your health care provider.  Follow a healthy diet and maintain a healthy weight. If you  need help with losing weight, ask your health care provider.  Limit alcohol intake to no more than 1 drink per day for nonpregnant women and 2 drinks per day for men. One drink equals 12 oz of beer, 5 oz of wine, or 1 oz of hard liquor.  Do not use any products that contain  nicotine or tobacco, such as cigarettes and e-cigarettes. If you need help quitting, ask your health care provider.  Keep all follow-up visits as directed by your health care provider. This is important. Contact a health care provider if:  You have new symptoms.  Your symptoms get worse. Get help right away if:  You have chest pain or shortness of breath, especially during or after sports.  You feel faint or you pass out.  You have trouble breathing even at rest.  Your feet or ankles swell.  Your heartbeat seems irregular or seems faster than normal (palpitations).  Your heartbeat seems abnormal. This information is not intended to replace advice given to you by your health care provider. Make sure you discuss any questions you have with your health care provider. Document Released: 11/28/2003 Document Revised: 11/15/2015 Document Reviewed: 11/15/2015 Elsevier Interactive Patient Education  2018 [employer]

## 2017-04-03 NOTE — Progress Notes (Signed)
Progress Note  Patient Name: [name] Date of Encounter: 04/03/2017  Primary Cardiologist: Eden EmmsNishan   Subjective   Feeling better, no chest pain, no shortness of breath.  No new palpitations.  Yesterday palpitation corresponded with paroxysmal atrial tachycardia.  Status post cholecystectomy last Thursday.  Inpatient Medications    Scheduled Meds: . aspirin EC  81 mg Oral Daily  . insulin aspart  0-9 Units Subcutaneous TID WC  . metoprolol succinate  25 mg Oral Daily  . rivaroxaban  20 mg Oral Q supper  . sodium chloride flush  3 mL Intravenous Q12H   Continuous Infusions:  PRN Meds:    Vital Signs    Vitals:   04/02/17 0516 04/02/17 1448 04/02/17 2051 04/03/17 0424  BP: 114/66 135/70 122/72 140/76  Pulse: 79 100 74 88  Resp:      Temp: 98.7 F (37.1 C) 99.3 F (37.4 C) 98.3 F (36.8 C) 98.5 F (36.9 C)  TempSrc: Oral Oral Oral Oral  SpO2: 100% 96% 100% 100%  Weight: 167 lb (75.8 kg)   170 lb 9.6 oz (77.4 kg)  Height:        Intake/Output Summary (Last 24 hours) at 04/03/2017 0819 Last data filed at 04/02/2017 1738 Gross per 24 hour  Intake 960 ml  Output -  Net 960 ml   Filed Weights   04/01/17 1558 04/02/17 0516 04/03/17 0424  Weight: 168 lb 12.8 oz (76.6 kg) 167 lb (75.8 kg) 170 lb 9.6 oz (77.4 kg)    Telemetry    Sinus rhythm heart rate 81 bpm, no adverse arrhythmias, no atrial fibrillation- Personally Reviewed  ECG    Sinus rhythm, LVH with repolarization abnormality- Personally Reviewed  Physical Exam   GEN: No acute distress.   Neck: No JVD Cardiac: RRR, 2/6 systolic murmur,no rubs, or gallops.  Respiratory: Clear to auscultation bilaterally. GI: Soft, nontender, non-distended  MS: No edema; No deformity. Neuro:  Nonfocal  Psych: Normal affect   Labs    Chemistry Recent Labs  Lab 03/31/17 2223 04/01/17 0827 04/02/17 0406  NA 135 133* 136  K 3.5 3.5 4.0  CL 90* 94* 98*  CO2 27 27 28   GLUCOSE 259* 245* 249*  BUN 23* 17  17  CREATININE 1.14* 1.02* 1.02*  CALCIUM 9.5 9.0 9.1  GFRNONAA 57* >60 >60  GFRAA >60 >60 >60  ANIONGAP 18* 12 10     Hematology Recent Labs  Lab 03/31/17 2223 04/02/17 0406  WBC 15.1* 10.9*  RBC 4.98 4.63  HGB 13.0 11.7*  HCT 39.2 36.5  MCV 78.7 78.8  MCH 26.1 25.3*  MCHC 33.2 32.1  RDW 13.9 13.8  PLT 313 231    Cardiac Enzymes Recent Labs  Lab 04/01/17 0827 04/01/17 1614 04/01/17 2214  TROPONINI 0.12* 0.13* 0.11*    Recent Labs  Lab 03/31/17 2319 04/01/17 0332  TROPIPOC 0.15* 0.13*     BNPNo results for input(s): BNP, PROBNP in the last 168 hours.   DDimer No results for input(s): DDIMER in the last 168 hours.   Radiology    No results found.  Cardiac Studies   Echocardiogram: Moderate LVH with findings suggestive of hypertrophic obstructive cardiomyopathy with systolic anterior motion of the mitral valve  Patient Profile     47 y.o. female with echocardiogram consistent with hypertrophic obstructive cardiomyopathy status post cholecystectomy last week.  Assessment & Plan    Hypertrophic obstructive cardiomyopathy - We will go ahead and increase her Toprol from 25-50 mg once  a day.  Her heart rate currently is 84.  Goal is to have her heart rate in the 60s-50s in order to help relax obstructive component of hypertrophic cardiomyopathy. -Try to avoid excessive dehydration which can accentuate the obstructive component as well.  Stay hydrated. -We stopped her amlodipine 10 mg to allow [place] to increase her beta-blocker.  Continue to hold her HCTZ as well. -As outpatient, would continue to increase Toprol, likely next up to 100 mg daily.  Also consider outpatient cardiac MRI for further evaluation of her hypertrophic cardiomyopathy. Also treadmill when medication completed titration.   Troponin elevation -Low level, flat troponin.  Likely related to her left ventricular hypertrophy, supply demand mismatch/demand ischemia.  Not acute coronary syndrome.  Low  level troponin however does portend  an increased risk.  PE CT was negative.  Paroxysmal atrial fibrillation -On Xarelto.  No evidence of atrial fibrillation on current monitor.  Hypertension with diabetes -Hemoglobin A1c 10.9, per primary team.  One could consider lisinopril 2.5 mg once a day for renal protection.  Comfortable with discharge from cardiology perspective. Will sign off. Will have follow up.   For questions or updates, please contact [facility] Please consult [email or web address] for contact info under Cardiology/STEMI.      Signed, Donato Schultz, MD  04/03/2017, 8:19 AM

## 2017-04-05 ENCOUNTER — Encounter (INDEPENDENT_AMBULATORY_CARE_PROVIDER_SITE_OTHER): Payer: Self-pay

## 2017-04-05 ENCOUNTER — Encounter: Payer: Self-pay | Admitting: Internal Medicine

## 2017-04-05 ENCOUNTER — Ambulatory Visit: Payer: BC Managed Care – PPO | Admitting: Internal Medicine

## 2017-04-05 ENCOUNTER — Other Ambulatory Visit: Payer: Self-pay

## 2017-04-05 VITALS — BP 147/79 | HR 79 | Temp 98.7°F | Ht 66.0 in | Wt 174.8 lb

## 2017-04-05 DIAGNOSIS — Z7984 Long term (current) use of oral hypoglycemic drugs: Secondary | ICD-10-CM

## 2017-04-05 DIAGNOSIS — R011 Cardiac murmur, unspecified: Secondary | ICD-10-CM

## 2017-04-05 DIAGNOSIS — E119 Type 2 diabetes mellitus without complications: Secondary | ICD-10-CM | POA: Diagnosis not present

## 2017-04-05 DIAGNOSIS — I421 Obstructive hypertrophic cardiomyopathy: Secondary | ICD-10-CM

## 2017-04-05 DIAGNOSIS — E118 Type 2 diabetes mellitus with unspecified complications: Secondary | ICD-10-CM

## 2017-04-05 DIAGNOSIS — Z79899 Other long term (current) drug therapy: Secondary | ICD-10-CM

## 2017-04-05 MED ORDER — METOPROLOL SUCCINATE ER 50 MG PO TB24: 50.0000 mg | ORAL_TABLET | Freq: Every day | ORAL | 2 refills | Status: DC

## 2017-04-05 MED ORDER — METFORMIN HCL 1000 MG PO TABS: 1000.0000 mg | ORAL_TABLET | Freq: Two times a day (BID) | ORAL | 5 refills | Status: DC

## 2017-04-05 MED FILL — METOPROLOL SUCCINATE ER 50: 50 | 30 days supply | Qty: 30 | Fill #0

## 2017-04-05 MED FILL — metFORMIN HCL 1000 MG TABS: 1000 | 30 days supply | Qty: 60 | Fill #0

## 2017-04-05 NOTE — Assessment & Plan Note (Signed)
Recently admitted for heart palpitations found to have hypertrophic obstructive cardiomyopathy on echocardiogram. Cardiology was consulted and she was started on metoprolol 50 mg daily with goal HR 50-60s. She was instructed to hold her amlodipine and lisinopril-hctz. Unfortunately patient was discharged over the weekend and unable to pick up her prescriptions because her pharmacy was closed. BP today is 147/79 and HR 79. She has continued to take her old BP prescriptions in the mean time. Today I reiterated plan to d/c amlodipine & lisinopril-hctz and take metoprolol 50 mg daily alone. May eventually need to increase to 100 mg daily. She reports intermittent dizziness but denies palpitations and LOC.  -- Metoprolol 50 mg daily -- F/u cardiology 2 weeks -- [facility]

## 2017-04-05 NOTE — Assessment & Plan Note (Addendum)
Last A1c in March 2019 was 10.9. She reports recently increasing her metformin from 500 mg BID -> 1,000 mg BID. Will continue this regimen for now. Educated patient on signs and symptoms of hyperglycemia. F/u 2 months for A1c recheck.  -- Refilled metformin 1,000 mg BID -- F/u 2 months

## 2017-04-05 NOTE — Patient Instructions (Addendum)
[name],  It was a pleasure to meet you today. I am glad you are doing well. For your heart and blood pressure, please take metoprolol 50 mg once a day. Please do not take your other blood pressure medicine until you follow up with us or your cardiologist in clinic for a blood pressure recheck.   Please continue to take your metformin twice a day. Please follow up with us again in 2 months. If you have any questions or concerns, call our clinic at [phone] or after hours call [phone] and ask for the internal medicine resident on call. Thank you!  - Dr. Antony ContrasGuilloud    Hypertrophic Cardiomyopathy Hypertrophic cardiomyopathy (HCM) is a heart condition in which part of the heart muscle gets too thick. The condition can cause a dangerous and abnormal heart rhythm. It can also weaken the heart over time. The heart is divided into four chambers. The thickening usually affects the pumping chamber on the lower left (left ventricle). HCM may also affect the valve that lets blood flow out of the left ventricle (mitral valve). Symptoms often begin at about age 47. What are the causes? This condition is usually caused by abnormal genes that control heart muscle growth. These abnormal genes are passed down through families (are inherited). What increases the risk? This condition is more likely to develop in people with a family history of HCM. What are the signs or symptoms? Symptoms of this condition include:  Shortness of breath, especially after exercising or lying down.  Chest pain.  Dizziness.  Fatigue.  Irregular or fast heart rate.  Fainting, especially after physical activity.  How is this diagnosed? This condition is diagnosed based on the results of a physical exam that involves checking for an abnormal heart sound (heart murmur). Tests may be done, including:  An electrocardiogram (ECG). This test records your heart's electrical activity.  An echocardiogram. This test can show  whether your left ventricle is enlarged and whether it fills slowly.  A Doppler test. This test shows irregular blood flow and pressure differences inside the heart.  A chest X-ray. This test can show whether your heart is enlarged.  An MRI.  How is this treated? Treatment for HCM depends on how severe your symptoms are. There are several options for treatment, including.  Medicine. Medicines can be given to: ? Reduce the workload of your heart. ? Lower your blood pressure. ? Thin your blood and prevent clots.  A device. Devices that can be used to treat this condition include: ? A pacemaker. This device helps to control your heartbeat. ? A defibrillator. This device restores a normal heart rhythm.  Surgery. This may include a procedure to: ? Inject alcohol into the small blood vessels that supply your heart muscle (alcohol septal ablation). This is done during a procedure called cardiac catheterization. The goal is to cause the muscle to become thinner. ? Remove part of the wall that divides the right and left sides of the heart (septum). ? Replace the mitral valve.  Follow these instructions at home:  Avoid strenuous exercise and activities, like heavy lifting or shoveling snow.  Make sure the members of your household know how to do CPR in case of an emergency.  Take over-the-counter and prescription medicines only as told by your health care provider.  Follow a healthy diet and maintain a healthy weight. If you need help with losing weight, ask your health care provider.  Limit alcohol intake to no more than 1  drink per day for nonpregnant women and 2 drinks per day for men. One drink equals 12 oz of beer, 5 oz of wine, or 1 oz of hard liquor.  Do not use any products that contain nicotine or tobacco, such as cigarettes and e-cigarettes. If you need help quitting, ask your health care provider.  Keep all follow-up visits as directed by your health care provider. This is  important. Contact a health care provider if:  You have new symptoms.  Your symptoms get worse. Get help right away if:  You have chest pain or shortness of breath, especially during or after sports.  You feel faint or you pass out.  You have trouble breathing even at rest.  Your feet or ankles swell.  Your heartbeat seems irregular or seems faster than normal (palpitations).  Your heartbeat seems abnormal. This information is not intended to replace advice given to you by your health care provider. Make sure you discuss any questions you have with your health care provider. Document Released: 11/28/2003 Document Revised: 11/15/2015 Document Reviewed: 11/15/2015 Elsevier Interactive Patient Education  2018 [employer].  Hyperglycemia Hyperglycemia occurs when the level of sugar (glucose) in the blood is too high. Glucose is a type of sugar that provides the body's main source of energy. Certain hormones (insulin and glucagon) control the level of glucose in the blood. Insulin lowers blood glucose, and glucagon increases blood glucose. Hyperglycemia can result from having too little insulin in the bloodstream, or from the body not responding normally to insulin. Hyperglycemia occurs most often in people who have diabetes (diabetes mellitus), but it can happen in people who do not have diabetes. It can develop quickly, and it can be life-threatening if it causes you to become severely dehydrated (diabetic ketoacidosis or hyperglycemic hyperosmolar state). Severe hyperglycemia is a medical emergency. What are the causes? If you have diabetes, hyperglycemia may be caused by:  Diabetes medicine.  Medicines that increase blood glucose or affect your diabetes control.  Not eating enough, or not eating often enough.  Changes in physical activity level.  Being sick or having an infection.  If you have prediabetes or undiagnosed diabetes:  Hyperglycemia may be caused by those  conditions.  If you do not have diabetes, hyperglycemia may be caused by:  Certain medicines, including steroid medicines, beta-blockers, epinephrine, and thiazide diuretics.  Stress.  Serious illness.  Surgery.  Diseases of the pancreas.  Infection.  What increases the risk? Hyperglycemia is more likely to develop in people who have risk factors for diabetes, such as:  Having a family member with diabetes.  Having a gene for type 1 diabetes that is passed from parent to child (inherited).  Living in an area with cold weather conditions.  Exposure to certain viruses.  Certain conditions in which the body's disease-fighting (immune) system attacks itself (autoimmune disorders).  Being overweight or obese.  Having an inactive (sedentary) lifestyle.  Having been diagnosed with insulin resistance.  Having a history of prediabetes, gestational diabetes, or polycystic ovarian syndrome (PCOS).  Being of American-Indian, African-American, Hispanic/Latino, or Asian/Pacific Islander descent.  What are the signs or symptoms? Hyperglycemia may not cause any symptoms. If you do have symptoms, they may include early warning signs, such as:  Increased thirst.  Hunger.  Feeling very tired.  Needing to urinate more often than usual.  Blurry vision.  Other symptoms may develop if hyperglycemia gets worse, such as:  Dry mouth.  Loss of appetite.  Fruity-smelling breath.  Weakness.  Unexpected or rapid weight gain or weight loss.  Tingling or numbness in the hands or feet.  Headache.  Skin that does not quickly return to normal after being lightly pinched and released (poor skin turgor).  Abdominal pain.  Cuts or bruises that are slow to heal.  How is this diagnosed? Hyperglycemia is diagnosed with a blood test to measure your blood glucose level. This blood test is usually done while you are having symptoms. Your health care provider may also do a physical exam  and review your medical history. You may have more tests to determine the cause of your hyperglycemia, such as:  A fasting blood glucose (FBG) test. You will not be allowed to eat (you will fast) for at least 8 hours before a blood sample is taken.  An A1c (hemoglobin A1c) blood test. This provides information about blood glucose control over the previous 2-3 months.  An oral glucose tolerance test (OGTT). This measures your blood glucose at two times: ? After fasting. This is your baseline blood glucose level. ? Two hours after drinking a beverage that contains glucose.  How is this treated? Treatment depends on the cause of your hyperglycemia. Treatment may include:  Taking medicine to regulate your blood glucose levels. If you take insulin or other diabetes medicines, your medicine or dosage may be adjusted.  Lifestyle changes, such as exercising more, eating healthier foods, or losing weight.  Treating an illness or infection, if this caused your hyperglycemia.  Checking your blood glucose more often.  Stopping or reducing steroid medicines, if these caused your hyperglycemia.  If your hyperglycemia becomes severe and it results in hyperglycemic hyperosmolar state, you must be hospitalized and given IV fluids. Follow these instructions at home: General instructions  Take over-the-counter and prescription medicines only as told by your health care provider.  Do not use any products that contain nicotine or tobacco, such as cigarettes and e-cigarettes. If you need help quitting, ask your health care provider.  Limit alcohol intake to no more than 1 drink per day for nonpregnant women and 2 drinks per day for men. One drink equals 12 oz of beer, 5 oz of wine, or 1 oz of hard liquor.  Learn to manage stress. If you need help with this, ask your health care provider.  Keep all follow-up visits as told by your health care provider. This is important. Eating and  drinking  Maintain a healthy weight.  Exercise regularly, as directed by your health care provider.  Stay hydrated, especially when you exercise, get sick, or spend time in hot temperatures.  Eat healthy foods, such as: ? Lean proteins. ? Complex carbohydrates. ? Fresh fruits and vegetables. ? Low-fat dairy products. ? Healthy fats.  Drink enough fluid to keep your urine clear or pale yellow. If you have diabetes:   Make sure you know the symptoms of hyperglycemia.  Follow your diabetes management plan, as told by your health care provider. Make sure you: ? Take your insulin and medicines as directed. ? Follow your exercise plan. ? Follow your meal plan. Eat on time, and do not skip meals. ? Check your blood glucose as often as directed. Make sure to check your blood glucose before and after exercise. If you exercise longer or in a different way than usual, check your blood glucose more often. ? Follow your sick day plan whenever you cannot eat or drink normally. Make this plan in advance with your health care provider.  Share your diabetes  management plan with people in your workplace, school, and household.  Check your urine for ketones when you are ill and as told by your health care provider.  Carry a medical alert card or wear medical alert jewelry. Contact a health care provider if:  Your blood glucose is at or above 240 mg/dL (16.1 mmol/L) for 2 days in a row.  You have problems keeping your blood glucose in your target range.  You have frequent episodes of hyperglycemia. Get help right away if:  You have difficulty breathing.  You have a change in how you think, feel, or act (mental status).  You have nausea or vomiting that does not go away. These symptoms may represent a serious problem that is an emergency. Do not wait to see if the symptoms will go away. Get medical help right away. Call your local emergency services (911 in the [place] Do not drive yourself  to the hospital. Summary  Hyperglycemia occurs when the level of sugar (glucose) in the blood is too high.  Hyperglycemia is diagnosed with a blood test to measure your blood glucose level. This blood test is usually done while you are having symptoms. Your health care provider may also do a physical exam and review your medical history.  If you have diabetes, follow your diabetes management plan as told by your health care provider.  Contact your health care provider if you have problems keeping your blood glucose in your target range. This information is not intended to replace advice given to you by your health care provider. Make sure you discuss any questions you have with your health care provider. Document Released: 06/17/2000 Document Revised: 09/09/2015 Document Reviewed: 09/09/2015 Elsevier Interactive Patient Education  [employer].

## 2017-04-05 NOTE — Progress Notes (Signed)
   CC: HOCM f/u   HPI:  [name] is a 47 y.o. 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 For the details of today's visit, please refer to the assessment and plan.  Past Medical History:  Diagnosis Date  . Bell's palsy 03/2017  . Gallstones   . Gestational diabetes mellitus (GDM) 2005; 2006  . Headache    "monthly" (03/12/2017)  . History of chest pain   . History of prolonged Q-T interval on ECG    a. 09/2012 adm: QTC 506.  Marland Kitchen. Hypertension   . PAF (paroxysmal atrial fibrillation) (HCC) 2012   a. 07/2011 ED visit with AF-> converted with flecainide, never followed-up;  b. 05/2012 Recurrent Afib -echo: EF 65-60%, mildly dil LA. Placed on Xarelto/daily Flecainide. c. 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  . Small bowel obstruction (HCC) 09/2010   resolved  . Type II diabetes mellitus (HCC)    a. 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 b. 05/2012 A1c 12.6.    Review of Systems  Cardiovascular: Negative for palpitations.  Neurological: Positive for dizziness. Negative for loss of consciousness.    Physical Exam:  Vitals:   04/05/17 1012  BP: (!) 147/79  Pulse: 79  Temp: 98.7 F (37.1 C)  TempSrc: Oral  SpO2: 100%  Weight: 174 lb 12.8 oz (79.3 kg)  Height: 5\' 6"  (1.676 m)    Constitutional: NAD, appears comfortable HEENT: Palate rises symmetrically, normal oropharynx. Moist mucous membranes.  Cardiovascular: RRR, 3/6 systolic murmur Pulmonary/Chest: CTAB, no wheezes, rales, or rhonchi.  Extremities: Warm and well perfused. No edema.  Neurological: A&Ox3, CN II - XII grossly intact.  Psychiatric: Normal mood and affect  Assessment & Plan:   See Encounters Tab for problem based charting.  Patient discussed with Dr. Cleda DaubE. Hoffman

## 2017-04-08 NOTE — Progress Notes (Signed)
Internal Medicine Clinic Attending  Case discussed with Dr. Guilloud at the time of the visit.  We reviewed the resident's history and exam and pertinent patient test results.  I agree with the assessment, diagnosis, and plan of care documented in the resident's note.  

## 2017-04-19 NOTE — Progress Notes (Signed)
Cardiology Office Note   Date:  04/19/2017   ID:  [name], DOB March 14, 1970, MRN [id number]  PCP:  Burna Cash, MD  Cardiologist:   Charlton Haws, MD   No chief complaint on file.     History of Present Illness: [name] is a 47 y.o. female who presents for post hospital f/u. I have not seen in over 2 years. Seen by Dr Anne Fu 04/02/17 She has a history of PAF dating back to 2013. Issues with insurance and compliance She had gallbladder removed on 03/25/17 had palpitations with telemetry showing only sinus tachycardia. CTA negative PE and troponin .13 Also recent Bell's palsy March 2019 Rx with prednisone and valacyclovir Echo suggested HOCM with resting gradient 29 mmHg mild to moderate MR Telemetry suggested PAT and beta blocker increased HCTZ held and amlodipine d/c . 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 Compliant with meds No chest pain dyspnea palpitations or pre syncope     Past Medical History:  Diagnosis Date  . Bell's palsy 03/2017  . Gallstones   . Gestational diabetes mellitus (GDM) 2005; 2006  . Headache    "monthly" (03/12/2017)  . History of chest pain   . History of prolonged Q-T interval on ECG    a. 09/2012 adm: QTC 506.  Marland Kitchen Hypertension   . PAF (paroxysmal atrial fibrillation) (HCC) 2012   a. 07/2011 ED visit with AF-> converted with flecainide, never followed-up;  b. 05/2012 Recurrent Afib -echo: EF 65-60%, mildly dil LA. Placed on Xarelto/daily Flecainide. c. 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  . Small bowel obstruction (HCC) 09/2010   resolved  . Type II diabetes mellitus (HCC)    a. 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 b. 05/2012 A1c 12.6.    Past Surgical History:  Procedure Laterality Date  . CESAREAN SECTION  2005; 2006  . CHOLECYSTECTOMY N/A 03/25/2017   Procedure: LAPAROSCOPIC CHOLECYSTECTOMY;  Surgeon: Kinsinger, De Blanch, MD;  Location: [facility];  Service: General;  Laterality: N/A;  . HERNIA REPAIR    . VENTRAL HERNIA REPAIR  02/05/2005   with mesh     Current Outpatient Medications  Medication Sig Dispense Refill  . Artificial Tear Ointment (ARTIFICIAL TEARS) ointment Place into the left eye as needed. Please use ointment daily at bedtime before sleeping. (Patient taking differently: Place 1-2 drops into the left eye at bedtime. Please use ointment daily at bedtime before sleeping.) 3.5 g 12  . aspirin EC 81 MG tablet Take 81 mg by mouth daily.    Marland Kitchen ibuprofen (ADVIL,MOTRIN) 800 MG tablet Take 1 tablet (800 mg total) by mouth every 8 (eight) hours as needed. (Patient taking differently: Take 800 mg by mouth every 8 (eight) hours as needed for moderate pain. ) 30 tablet 0  . metFORMIN (GLUCOPHAGE) 1000 MG tablet Take 1 tablet (1,000 mg total) by mouth 2 (two) times daily with a meal. 60 tablet 5  . metoprolol succinate (TOPROL-XL) 50 MG 24 hr tablet Take 1 tablet (50 mg total) by mouth daily. Take with or immediately following a meal. 30 tablet 2  . Propylene Glycol (SYSTANE COMPLETE) 0.6 % SOLN Apply 1 drop to eye QID. 15 mL 2  . rivaroxaban (XARELTO) 20 MG TABS tablet Take 1 tablet (20 mg total) by mouth daily with supper. 1 tablet 0   No current facility-administered medications for this visit.  Allergies:   Patient has no known allergies.    Social History:  The patient  reports that she has never smoked. She has never used smokeless tobacco. She reports that she does not drink alcohol or use drugs.   Family History:  The patient's family history includes Asthma in her mother; Other in her father, sister, and sister.    ROS:  Please see the history of present illness.   Otherwise, review of systems are positive for none.   All other systems are reviewed and negative.    PHYSICAL EXAM: VS:  LMP 10/01/2006  , BMI There is no height or weight on file to calculate BMI. Affect appropriate Healthy:   appears stated age HEENT: normal Neck supple with no adenopathy JVP normal no bruits no thyromegaly Lungs clear with no wheezing and good diaphragmatic motion Heart:  S1/S2 SEM accentuates with valsalva    no rub, gallop or click PMI normal Abdomen: benighn, BS positve, no tenderness, no AAA no bruit.  No HSM or HJR Distal pulses intact with no bruits No edema Neuro non-focal Skin warm and dry No muscular weakness    EKG:  NSR poor R wave progression    Recent Labs: 03/11/2017: TSH 1.116 03/12/2017: ALT 22 04/01/2017: Magnesium 2.0 04/02/2017: BUN 17; Creatinine, Ser 1.02; Hemoglobin 11.7; Platelets 231; Potassium 4.0; Sodium 136    Lipid Panel    Component Value Date/Time   CHOL 102 03/11/2017 1830   CHOL 109 01/15/2017 1645   TRIG 91 03/11/2017 1830   HDL 53 03/11/2017 1830   HDL 51 01/15/2017 1645   CHOLHDL 1.9 03/11/2017 1830   VLDL 18 03/11/2017 1830   LDLCALC 31 03/11/2017 1830   LDLCALC 17 01/15/2017 1645      Wt Readings from Last 3 Encounters:  04/05/17 174 lb 12.8 oz (79.3 kg)  04/03/17 170 lb 9.6 oz (77.4 kg)  03/25/17 168 lb 12.8 oz (76.6 kg)      Other studies Reviewed: Additional studies/ records that were reviewed today include: Recent hospitalization cardiology notes Dr Anne FuSkains , telemetry labs, ECG and Echo.    ASSESSMENT AND PLAN:  1.  HOCM:  Add verapamil to toprol and f/u echo in 6 months  2. PAF CHADVASC 3 on xarelto currently in NSR will tolerate poorly with HOCM  3. Chest Pain Echo with no RWMA, will observe and order f/u myovue for any recurrence  4. HTN:  Borderline with HOCM adding verapamil to toprol  5. DM:  Discussed low carb diet.  Target hemoglobin A1c is 6.5 or less.  Continue current medications.    Current medicines are reviewed at length with the patient today.  The patient does not have concerns regarding medicines.  The following changes have been made:  Verapamil 180 gm   Labs/ tests ordered today include: Echo in 6  months  No orders of the defined types were placed in this encounter.    Disposition:   FU with cardiology in 6 months      Signed, Charlton HawsPeter Gertude Benito, MD  04/19/2017 12:23 PM    [facility] [address], [place], [place]  [address] Phone: [phone]; Fax: [phone]

## 2017-04-21 ENCOUNTER — Ambulatory Visit (INDEPENDENT_AMBULATORY_CARE_PROVIDER_SITE_OTHER): Payer: BC Managed Care – PPO | Admitting: Cardiovascular Disease

## 2017-04-21 ENCOUNTER — Encounter: Payer: Self-pay | Admitting: Cardiovascular Disease

## 2017-04-21 ENCOUNTER — Encounter (INDEPENDENT_AMBULATORY_CARE_PROVIDER_SITE_OTHER): Payer: Self-pay

## 2017-04-21 VITALS — BP 144/88 | HR 76 | Ht 66.0 in | Wt 178.2 lb

## 2017-04-21 DIAGNOSIS — I421 Obstructive hypertrophic cardiomyopathy: Secondary | ICD-10-CM | POA: Diagnosis not present

## 2017-04-21 MED ORDER — VERAPAMIL HCL ER 180 MG PO TBCR: 180.0000 mg | EXTENDED_RELEASE_TABLET | Freq: Every day | ORAL | 3 refills | Status: DC

## 2017-04-21 NOTE — Patient Instructions (Signed)
Medication Instructions:  Your physician has recommended you make the following change in your medication:  1- START Verapamil 180 mg by mouth daily.   Labwork: NONE  Testing/Procedures: NONE  Follow-Up: Your physician wants you to follow-up in: 6 months with Dr. Eden EmmsNishan. You will receive a reminder letter in the mail two months in advance. If you don't receive a letter, please call our office to schedule the follow-up appointment.   If you need a refill on your cardiac medications before your next appointment, please call your pharmacy.

## 2017-04-26 ENCOUNTER — Other Ambulatory Visit: Payer: Self-pay | Admitting: Pharmacist

## 2017-04-26 ENCOUNTER — Telehealth: Payer: Self-pay

## 2017-04-26 DIAGNOSIS — I4891 Unspecified atrial fibrillation: Secondary | ICD-10-CM

## 2017-04-26 MED ORDER — RIVAROXABAN 20 MG PO TABS: 20.0000 mg | ORAL_TABLET | Freq: Every day | ORAL | 3 refills | Status: DC

## 2017-05-12 MED FILL — XARELTO 20 MG TABLET: 20 | 30 days supply | Qty: 30 | Fill #0

## 2017-05-12 MED FILL — METOPROLOL SUCCINATE ER 50: 50 | 30 days supply | Qty: 30 | Fill #1

## 2017-05-12 MED FILL — metFORMIN HCL 1000 MG TABS: 1000 | 30 days supply | Qty: 60 | Fill #1

## 2017-05-13 ENCOUNTER — Telehealth: Payer: Self-pay | Admitting: Cardiovascular Disease

## 2017-05-13 NOTE — Telephone Encounter (Signed)
New Message:      Pt c/o medication issue:  1. Name of Medication: Amolodipine and verapamil (CALAN-SR) 180 MG CR tablet  2. How are you currently taking this medication (dosage and times per day)? Take 1 tablet (180 mg total) by mouth daily.  3. Are you having a reaction (difficulty breathing--STAT)? No  4. What is your medication issue? [name] from outpatient pharmacy states that these two medications are in the same category. She states is the pt switching to one or the other or is pt supposed to be taking both of these medications.

## 2017-05-13 NOTE — Telephone Encounter (Signed)
Informed Chales Abrahams, the pharmacist,  that amlodipine was suppose to be discontinue per hospital discharge orders on 04/03/17. Patient is suppose to start verapamil after her office visit in April. Patient should not be taking amlodipine. Chales Abrahams verbalized understanding.

## 2017-06-11 ENCOUNTER — Encounter: Payer: Self-pay | Admitting: Internal Medicine

## 2017-06-21 MED FILL — METOPROLOL SUCCINATE ER 50: 50 | 30 days supply | Qty: 30 | Fill #2

## 2017-06-21 MED FILL — XARELTO 20 MG TABLET: 20 | 30 days supply | Qty: 30 | Fill #1

## 2017-07-09 ENCOUNTER — Encounter: Payer: Self-pay | Admitting: Internal Medicine

## 2017-07-09 ENCOUNTER — Ambulatory Visit (INDEPENDENT_AMBULATORY_CARE_PROVIDER_SITE_OTHER): Payer: BC Managed Care – PPO | Admitting: Internal Medicine

## 2017-07-09 ENCOUNTER — Encounter (INDEPENDENT_AMBULATORY_CARE_PROVIDER_SITE_OTHER): Payer: Self-pay

## 2017-07-09 VITALS — BP 163/79 | HR 72 | Temp 98.7°F | Wt 174.3 lb

## 2017-07-09 DIAGNOSIS — Z8669 Personal history of other diseases of the nervous system and sense organs: Secondary | ICD-10-CM

## 2017-07-09 DIAGNOSIS — Z79899 Other long term (current) drug therapy: Secondary | ICD-10-CM

## 2017-07-09 DIAGNOSIS — I480 Paroxysmal atrial fibrillation: Secondary | ICD-10-CM

## 2017-07-09 DIAGNOSIS — G510 Bell's palsy: Secondary | ICD-10-CM | POA: Diagnosis not present

## 2017-07-09 DIAGNOSIS — R011 Cardiac murmur, unspecified: Secondary | ICD-10-CM | POA: Diagnosis not present

## 2017-07-09 DIAGNOSIS — I421 Obstructive hypertrophic cardiomyopathy: Secondary | ICD-10-CM

## 2017-07-09 DIAGNOSIS — I10 Essential (primary) hypertension: Secondary | ICD-10-CM | POA: Diagnosis not present

## 2017-07-09 MED ORDER — CARBOXYMETHYLCELLULOSE SODIUM 0.5 % OP SOLN: 2.0000 [drp] | Freq: Three times a day (TID) | OPHTHALMIC | 0 refills | Status: DC | PRN

## 2017-07-09 MED ORDER — PREDNISONE 20 MG PO TABS: 60.0000 mg | ORAL_TABLET | Freq: Every day | ORAL | 0 refills | Status: AC

## 2017-07-09 MED FILL — predniSONE 20 MG TABS: 20 | 7 days supply | Qty: 21 | Fill #0

## 2017-07-09 NOTE — Progress Notes (Signed)
   CC: New right sided facial droop.  HPI:  [name] is a 47 y.o. F with history of L-sided Bells Palsy, HOCM, PAF and HTN who presents today for evaluation of new right-sided facial droop.   For details regarding today's visit and the status of their chronic medical issues, please refer to the assessment and plan.  Past Medical History:  Diagnosis Date  . Bell's palsy 03/2017  . Gallstones   . Gestational diabetes mellitus (GDM) 2005; 2006  . Headache    "monthly" (03/12/2017)  . History of chest pain   . History of prolonged Q-T interval on ECG    a. 09/2012 adm: QTC 506.  Marland Kitchen. Hypertension   . PAF (paroxysmal atrial fibrillation) (HCC) 2012   a. 07/2011 ED visit with AF-> converted with flecainide, never followed-up;  b. 05/2012 Recurrent Afib -echo: EF 65-60%, mildly dil LA. Placed on Xarelto/daily Flecainide. c. 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  . Small bowel obstruction (HCC) 09/2010   resolved  . Type II diabetes mellitus (HCC)    a. 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 b. 05/2012 A1c 12.6.   Review of Systems:   General: Denies fevers, chills, weight loss, fatigue HEENT: Admits to facial droop. Denies acute changes in vision, sore throat, ulcers or blisters Cardiac: Denies CP, SOB, palpitations Pulmonary: Denies cough, wheezes, PND Abd: Denies abdominal pain, changes in bowels Extremities: Denies weakness, joint pain or swelling  Physical Exam: General: Alert, in no acute distress. Pleasant and conversant.  HEENT: Facial asymmetry at rest with loss of right nasolabial fold. Smile asymmetric. Right forehead does not wrinkle and brow does not raise symmetrically. Eye able to close completely but with effort. Tongue protrudes midline. No hoarseness or drooling Cardiac: RRR. 2/6 systolic murmur Pulmonary: CTA BL with normal WOB on RA. Able to speak in complete sentences Abd: Soft, non-tender. +bs Extremities: Warm, perfused. No  significant pedal edema.  Neuro: Strength and sensation intact in BL UE and LE. Walks without issue.   Vitals:   07/09/17 0939  BP: (!) 163/79  Pulse: 72  Temp: 98.7 F (37.1 C)  TempSrc: Oral  SpO2: 99%  Weight: 174 lb 4.8 oz (79.1 kg)   Body mass index is 28.13 kg/m.  Assessment & Plan:   See Encounters Tab for problem based charting.  Patient discussed with Dr. Sandre Kittyaines

## 2017-07-09 NOTE — Patient Instructions (Signed)
It was nice seeing you today. Thank you for choosing [facility] for your Primary Care.   Today we talked about:  1) Bells Palsy: [name], your facial droop is likely caused by Bells Palsy. This is a condition where the facial nerve doesn't work as well as it should. This usually does not cause long term complications. It is treated with a course of steroids which I've sent to your pharmacy. Take 60mg  (3 tablets) every morning for 1 week. 2) I am also checking a lab to check for Lyme disease. This is something that could cause recurrent Bells Palsy but unlikely.  3) Please go to your scheduled appointment with Dr. Evelene CroonSantos 7/26.   Please contact the clinic if you have any problems, or need to be seen sooner.

## 2017-07-09 NOTE — Assessment & Plan Note (Addendum)
Assessment:  [name] has noticeable facial asymmetry which she reports started Wednesday AM while she was eating breakfast at work. She noticed her mouth felt "funny" and was having some trouble keeping food in her mouth which felt similar to her prior episode of Bells Palsy. She looked in the mirror and noticed her face was asymmetric and had difficulty smiling. This was not present when she was getting ready for work in the morning.   She denies recent illness and has been in her usual state of health otherwise. She denies any travel outside of [place] other than remote trip [place]. Denies history of tick bites (but unsure of what they are), joint pain or rash.  Exam shows right-sided facial droop with sparing of the forehead. Tongue does not deviate and strength and sensation are intact throughout BL upper and lower extremities. Tympanic membrane without erythema or vesicles. Denies history of cold sores or other oral ulceration.     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 She presented 824-months ago with BP of L-facial nerve and had neurologic w/o which was reassuring. Symptoms resolved with Prednisone and Acyclovir.  It's unusual that she now has BP of the other facial nerve. Denied tick-exposure but actually was unsure of one was until I showed her a picture. Given the unusual nature of her presentation, I'm going to screen for Lyme disease.  -Prednisone 60mg  x 1 week -Patient to contact clinic or present to ED if symptoms worsen or if she were to develop palpitations (concern for AFib with RVR with Pred). Otherwise she has a scheduled PCP appointment later this month

## 2017-07-09 NOTE — Assessment & Plan Note (Signed)
Assessment & Plan: Patient reports good PO intake and has not appreciated any CP, SOB or palpitations. Murmur present on exam today and BP was elevated (Pt did not take AM Meds). Encouraged patient to take meds on arrival home and to ensure adequate hydration and PO intake.

## 2017-07-10 LAB — LYME AB/WESTERN BLOT REFLEX
LYME DISEASE AB, QUANT, IGM: <0.8 index (ref 0.00–0.79)
Lyme IgG/IgM Ab: <0.91 {ISR} (ref 0.00–0.90)

## 2017-07-12 ENCOUNTER — Telehealth: Payer: Self-pay | Admitting: Pharmacist

## 2017-07-12 NOTE — Progress Notes (Signed)
Steroid-Induced Hyperglycemia Prevention and Management [name] is a 47 y.o. female who [facility] (diabetes patient prescribed short course of steroids).  A/P Current Regimen  Patient prescribed prednisone 60 mg daily x 7 days, currently on day 3 of therapy. Patient taking prednisone in the AM  Prednisone indication: Bells Palsy  Current DM regimen metformin and insulin  Home BG Monitoring  Patient does have a meter at home and does check BG at home.  CBGs at home 80s and 90s, patient does not report BG > 200  A1C prior to steroid course 10.9%  S/Sx of hyper- or hypoglycemia: none, none  Medication Management  Additional treatment for BG control is not indicated at this time.  Will follow up with patient daily  Patient Education  Advised patient to monitor BG while on steroid therapy (at least twice daily prior to first 2 meals of the day).  Patient educated about signs/symptoms and advised to contact clinic if hyper- or hypoglycemic.  Patient did  verbalize understanding of information and regimen by repeating back topics discussed.  Follow-up Daily phone call, therapy completion scheduled for 07/16/17  Marzetta BoardJennifer Kim 12:00 PM 07/12/2017

## 2017-07-13 ENCOUNTER — Ambulatory Visit (INDEPENDENT_AMBULATORY_CARE_PROVIDER_SITE_OTHER): Payer: BC Managed Care – PPO | Admitting: Internal Medicine

## 2017-07-13 ENCOUNTER — Other Ambulatory Visit: Payer: Self-pay

## 2017-07-13 ENCOUNTER — Encounter: Payer: Self-pay | Admitting: Internal Medicine

## 2017-07-13 VITALS — BP 184/86 | HR 77 | Temp 99.1°F | Ht 66.0 in | Wt 180.0 lb

## 2017-07-13 DIAGNOSIS — I10 Essential (primary) hypertension: Secondary | ICD-10-CM

## 2017-07-13 DIAGNOSIS — G510 Bell's palsy: Secondary | ICD-10-CM | POA: Diagnosis not present

## 2017-07-13 DIAGNOSIS — E119 Type 2 diabetes mellitus without complications: Secondary | ICD-10-CM | POA: Diagnosis not present

## 2017-07-13 DIAGNOSIS — R011 Cardiac murmur, unspecified: Secondary | ICD-10-CM

## 2017-07-13 DIAGNOSIS — H04121 Dry eye syndrome of right lacrimal gland: Secondary | ICD-10-CM

## 2017-07-13 DIAGNOSIS — Z7952 Long term (current) use of systemic steroids: Secondary | ICD-10-CM

## 2017-07-13 MED ORDER — VALACYCLOVIR HCL 500 MG PO TABS: 500.0000 mg | ORAL_TABLET | Freq: Two times a day (BID) | ORAL | 0 refills | Status: DC

## 2017-07-13 MED ORDER — HYPROMELLOSE (GONIOSCOPIC) 2.5 % OP SOLN: 1.0000 [drp] | Freq: Four times a day (QID) | OPHTHALMIC | 12 refills | Status: DC | PRN

## 2017-07-13 MED ORDER — ARTIFICIAL TEARS OPHTHALMIC OINT
TOPICAL_OINTMENT | Freq: Three times a day (TID) | OPHTHALMIC | 3 refills | Status: DC
Start: 2017-07-13 — End: 2018-04-04

## 2017-07-13 MED FILL — VALACYCLOVIR HCL 500 MG TAB: 500 | 5 days supply | Qty: 10 | Fill #0

## 2017-07-13 NOTE — Patient Instructions (Addendum)
We will prescribe you some artificial tears to keep your eyes lubricated.  Additionally we will try an antiviral medicine called valacyclovir that may help decrease your time to recovery.  Continue on your steroid taper.

## 2017-07-13 NOTE — Progress Notes (Signed)
Internal Medicine Clinic Attending  Case discussed with Dr. Molt at the time of the visit.  We reviewed the resident's history and exam and pertinent patient test results.  I agree with the assessment, diagnosis, and plan of care documented in the resident's note.  Alexander Raines, M.D., Ph.D.  

## 2017-07-13 NOTE — Telephone Encounter (Signed)
Called to follow up on prevention of hyperglycemia in patient with diabetes while on steroids. [name] says she has only checked her blood sugar one time today and it was 134.  She takes her steroids in the am.  I reviewed the goal of keeping her blood sugar <200 mg/dl while on steroids and asked her to check it again this evening to be sure it is not increasing in the PM. She agreed to do so.  Plan: daily phone call, steroid treatment done on 07/16/17.  Norm Parcelonna Plyler, RD 07/13/2017 5:40 PM.

## 2017-07-13 NOTE — Assessment & Plan Note (Signed)
Pt with history of bell's palsy episode back in March of this year.  She had a brain MRI at that time which was negative and was placed on steroid taper.  Her symptoms took two weeks before they improved last time. She was seen for this issue about 4 days ago and reports no improvement.  She has intermittent pain very infrequently, often when she is trying to chew something on the right side.  She has no vision changes but her right eye appears somewhat dry and she admits it feels dry and irritated.  She asks if she can have a note for work as this has still not resolved.  She has a normal neuro exam today with the exception of complete right sided facial paralysis.   -continue steroid taper, will add valacyclovir 500mg  BID for 5 days -prescribed artificial tears ointment and solution instructed pt to use ointment before going to sleep otherwise whichever she feels works best for her during the day -She has close follow up with her pcp after this visit in about two weeks

## 2017-07-13 NOTE — Progress Notes (Addendum)
CC: bell's palsy  HPI:  [name] is a 47 y.o. female with PMH below.  She is here to address her bell's palsy that has not yet improved with steroid therapy. She has some steroid induced HTN today but she will begin lowering her steroid dose and only has a few more days altogether.  She has close follow up with her pcp to address her HTN and DM in about two weeks once steroids have washed out.  Dr. Selena BattenKim monitoring glucose has not been elevated.     Please see A&P for status of the patient's chronic medical conditions  Past Medical History:  Diagnosis Date  . Bell's palsy 03/2017  . Gallstones   . Gestational diabetes mellitus (GDM) 2005; 2006  . Headache    "monthly" (03/12/2017)  . History of chest pain   . History of prolonged Q-T interval on ECG    a. 09/2012 adm: QTC 506.  Marland Kitchen. Hypertension   . PAF (paroxysmal atrial fibrillation) (HCC) 2012   a. 07/2011 ED visit with AF-> converted with flecainide, never followed-up;  b. 05/2012 Recurrent Afib -echo: EF 65-60%, mildly dil LA. Placed on Xarelto/daily Flecainide. c. 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  . Small bowel obstruction (HCC) 09/2010   resolved  . Type II diabetes mellitus (HCC)    a. 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 b. 05/2012 A1c 12.6.   Review of Systems:  ROS: Pulmonary: pt denies increased work of breathing, shortness of breath,  Cardiac: pt denies palpitations, chest pain,  Abdominal: pt denies abdominal pain, nausea, vomiting, or diarrhea  Physical Exam:  Vitals:   07/13/17 0958  BP: (!) 184/86  Pulse: 77  Temp: 99.1 F (37.3 C)  TempSrc: Oral  SpO2: 100%  Weight: 180 lb (81.6 kg)  Height: 5\' 6"  (1.676 m)   Physical Exam  Constitutional: No distress.  Cardiovascular: Normal rate and regular rhythm. Exam reveals no gallop and no friction rub.  Murmur (3/6 systolic) heard. Pulmonary/Chest: Effort normal and breath sounds normal. No respiratory distress. She  has no wheezes. She has no rales. She exhibits no tenderness.  Neurological: She is alert.  II: Visual fields intact; PERRL. III,IV, UJ:WJXBVI:EOMI without nystagmus V,VII:complete right sided facial paralysis. Facial tempsensation equalbilaterally VIII: hearingintact to voice IX,X:Palaterises symmetrically JY:[id number]:Symmetricshoulder shrug XII: midline tongue extension Motor: RUE and RLE 5/5 LUE 5/5 LLE 5/5 Sensory:Tempand light touch intact x 4 Deep Tendon Reflexes: Normoactive x 4 without asymmetry Cerebellar:normal FNF and HS bilaterally Gait:Deferred   Skin: No rash (no visible rash on face or auricular area.  ) noted. She is not diaphoretic.    Social History   Socioeconomic History  . Marital status: Divorced    Spouse name: Not on file  . Number of children: 2  . Years of education: Not on file  . Highest education level: Not on file  Occupational History  . Occupation: Equities traderHOUSEKEEPING    Employer: [employer]  Social Needs  . Financial resource strain: Not on file  . Food insecurity:    Worry: Not on file    Inability: Not on file  . Transportation needs:    Medical: Not on file    Non-medical: Not on file  Tobacco Use  . Smoking status: Never Smoker  . Smokeless tobacco: Never Used  Substance and Sexual Activity  . Alcohol use: Never    Frequency: Never  . Drug use: Never  . Sexual activity: Not Currently  Partners: Male  Lifestyle  . Physical activity:    Days per week: Not on file    Minutes per session: Not on file  . Stress: Not on file  Relationships  . Social connections:    Talks on phone: Not on file    Gets together: Not on file    Attends religious service: Not on file    Active member of club or organization: Not on file    Attends meetings of clubs or organizations: Not on file    Relationship status: Not on file  . Intimate partner violence:    Fear of current or ex partner: Not on file    Emotionally abused: Not on file     Physically abused: Not on file    Forced sexual activity: Not on file  Other Topics Concern  . Not on file  Social History Narrative   ** Merged History Encounter **       ** Data from: 11/07/10 Enc Dept: [facility]   Pt has 6 sisters but doe not know their health history or status.      Mother and father unknown      Pt is an immigrant from [place], lives in [place], [place] by herself           ** Data from: 09/19/13 Enc [facility]: [facility]   Lives in [place] with significant other.  Works @ [employer] in [employer].  Does not routinely exercise.    Family History  Problem Relation Age of Onset  . Other Father        killed in [place] civil war when he was in his 86's  . Asthma Mother        alive in her 48's  . Other Sister        A & W  . Other Sister        A & W  . Heart disease Neg Hx     Assessment & Plan:   See Encounters Tab for problem based charting.  Patient discussed with Dr. Sandre Kitty

## 2017-07-13 NOTE — Progress Notes (Signed)
Internal Medicine Clinic Attending  Case discussed with Dr. Winfrey at the time of the visit.  We reviewed the resident's history and exam and pertinent patient test results.  I agree with the assessment, diagnosis, and plan of care documented in the resident's note.  Alexander Raines, M.D., Ph.D.  

## 2017-07-16 NOTE — Progress Notes (Signed)
Called patient to follow up with BG monitoring (unable to reach yesterday). Patient has completed prednisone course. BG this morning was 80. No further concerns at this time.

## 2017-07-23 ENCOUNTER — Telehealth: Payer: Self-pay | Admitting: Internal Medicine

## 2017-07-30 ENCOUNTER — Other Ambulatory Visit: Payer: Self-pay | Admitting: Dietician

## 2017-07-30 ENCOUNTER — Ambulatory Visit (INDEPENDENT_AMBULATORY_CARE_PROVIDER_SITE_OTHER): Payer: BC Managed Care – PPO | Admitting: Internal Medicine

## 2017-07-30 ENCOUNTER — Encounter: Payer: Self-pay | Admitting: Internal Medicine

## 2017-07-30 ENCOUNTER — Ambulatory Visit: Payer: BC Managed Care – PPO | Admitting: Dietician

## 2017-07-30 ENCOUNTER — Other Ambulatory Visit: Payer: Self-pay

## 2017-07-30 VITALS — BP 152/80 | HR 76 | Temp 98.8°F | Ht 66.0 in | Wt 180.6 lb

## 2017-07-30 DIAGNOSIS — Z8679 Personal history of other diseases of the circulatory system: Secondary | ICD-10-CM

## 2017-07-30 DIAGNOSIS — I421 Obstructive hypertrophic cardiomyopathy: Secondary | ICD-10-CM | POA: Diagnosis not present

## 2017-07-30 DIAGNOSIS — E118 Type 2 diabetes mellitus with unspecified complications: Secondary | ICD-10-CM

## 2017-07-30 DIAGNOSIS — Z79899 Other long term (current) drug therapy: Secondary | ICD-10-CM

## 2017-07-30 DIAGNOSIS — R011 Cardiac murmur, unspecified: Secondary | ICD-10-CM

## 2017-07-30 DIAGNOSIS — I10 Essential (primary) hypertension: Secondary | ICD-10-CM

## 2017-07-30 DIAGNOSIS — G510 Bell's palsy: Secondary | ICD-10-CM | POA: Diagnosis not present

## 2017-07-30 DIAGNOSIS — Z0000 Encounter for general adult medical examination without abnormal findings: Secondary | ICD-10-CM

## 2017-07-30 DIAGNOSIS — Z7901 Long term (current) use of anticoagulants: Secondary | ICD-10-CM

## 2017-07-30 DIAGNOSIS — Z7984 Long term (current) use of oral hypoglycemic drugs: Secondary | ICD-10-CM

## 2017-07-30 DIAGNOSIS — I480 Paroxysmal atrial fibrillation: Secondary | ICD-10-CM

## 2017-07-30 LAB — HM DIABETES EYE EXAM

## 2017-07-30 LAB — POCT GLYCOSYLATED HEMOGLOBIN (HGB A1C): Hemoglobin A1C: 9.7 % — AB (ref 4.0–5.6)

## 2017-07-30 LAB — GLUCOSE, CAPILLARY: Glucose-Capillary: 164 mg/dL — ABNORMAL HIGH (ref 70–99)

## 2017-07-30 MED ORDER — METOPROLOL SUCCINATE ER 50 MG PO TB24: 50.0000 mg | ORAL_TABLET | Freq: Every day | ORAL | 2 refills | Status: DC

## 2017-07-30 MED ORDER — RIVAROXABAN 20 MG PO TABS: 20.0000 mg | ORAL_TABLET | Freq: Every day | ORAL | 3 refills | Status: DC

## 2017-07-30 MED ORDER — EMPAGLIFLOZIN 10 MG PO TABS: 10.0000 mg | ORAL_TABLET | Freq: Every day | ORAL | 2 refills | Status: DC

## 2017-07-30 MED ORDER — METFORMIN HCL 1000 MG PO TABS: 1000.0000 mg | ORAL_TABLET | Freq: Two times a day (BID) | ORAL | 5 refills | Status: DC

## 2017-07-30 MED FILL — JARDIANCE 10 MG TABLET: 10 | 30 days supply | Qty: 30 | Fill #0

## 2017-07-30 MED FILL — VERAPAMIL ER 180 MG TABLET: 180 | 90 days supply | Qty: 90 | Fill #0

## 2017-07-30 MED FILL — metFORMIN HCL 1000 MG TABS: 1000 | 30 days supply | Qty: 60 | Fill #0

## 2017-07-30 MED FILL — METOPROLOL SUCCINATE ER 50: 50 | 30 days supply | Qty: 30 | Fill #0

## 2017-07-30 MED FILL — XARELTO 20 MG TABLET: 20 | 30 days supply | Qty: 30 | Fill #0

## 2017-07-30 NOTE — Assessment & Plan Note (Signed)
[facility]'s palsy: [facility] R facial weakness and numbnes sparing forehead and started on prednisone 60 mg x7 days. [facility] at which time symptoms had not improved and valacyclovir was added. She completed both courses and reports feeling much better with near complete resolution of symptoms. Will continue to monitor.

## 2017-07-30 NOTE — Progress Notes (Signed)
Retinal images were done and transmitted today.  

## 2017-07-30 NOTE — Progress Notes (Signed)
CC: DM and Bell's palsy follow up   HPI:  [name] is a 47 y.o. female with PMH listed below who presents to clinic for follow-up of type 2 diabetes mellitus and Bell's palsy.  T2DM, uncontrolled: Patient is doing well and denies increased thirst or urinary symptoms.  Currently on metformin 1000 mg BID, compliant.  Last A1c 10.9 in 3/19.  She recently completed a course of prednisone x 7 days for Bell's palsy as described below.  Her A1c prior to steroid use was 10.3.  Today her A1c is 9.7 and will therefore add a second hypoglycemic agent. Would benefit from SLGT-2 inhibitor given her history of heart disease.Start Jardiance 10 mg QD. No history of DKA or LE wounds/ulcers. Normal renal function in recent blood work. Follow up urine microalbumin. Foot and eye exam performed today. Follow up in 3 months.   [facility]'s [facility]y: [facility] with R facial weakness and numbnes sparing forehead and started on prednisone 60 mg x7 days. [facility] at which time symptoms had not improved and valacyclovir was added. She completed both courses and reports feeling much better with near complete resolution of symptoms. Will continue to monitor.   HTN, uncontrolled: On metoprolol XL 50 mg QD and compliant. BP 152/80 today. Seen by her cardiologist in 04/2017 at which time verapamil 180 mg QD was added. However, she has not been taking this medication as she states the pharmacy told her she should not be on it. She does not have any known drug allergies. Called pharmacy [facility] and spoke to pharmacist. Verapamil can increase Xarelto levels in the blood and increase risk of bleeding. This, however, is not a contraindication for it. Patient will start verapamil. Counseled on increased risk of bleeding and advised her to call us if she notices any bleeding.    HOCM: Patient follows up with Dr. Eden EmmsNishan on with cardiology.  She was last seen on 04/21/2017.  She was experiencing chest pain at the  time and a Myoview and TTE were ordered but patient has not been able to schedule this.  She is to follow-up with cardiology in October of this year for her 3952-month follow-up.  Denies active chest pain today.  PAF: Rate controlled on metoprolol XL 50 mg QD. Also on Xarelto.  Denies chest pain, shortness of breath, and palpitations.  Follows up with cardiology.   Health maintenance: Patient states she has never had a Pap smear. Will perform at 3 month follow up.    Past Medical History:  Diagnosis Date  . Bell's palsy 03/2017  . Gallstones   . Gestational diabetes mellitus (GDM) 2005; 2006  . Headache    "monthly" (03/12/2017)  . History of chest pain   . History of prolonged Q-T interval on ECG    a. 09/2012 adm: QTC 506.  Marland Kitchen. Hypertension   . PAF (paroxysmal atrial fibrillation) (HCC) 2012   a. 07/2011 ED visit with AF-> converted with flecainide, never followed-up;  b. 05/2012 Recurrent Afib -echo: EF 65-60%, mildly dil LA. Placed on Xarelto/daily Flecainide. c. 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  . Small bowel obstruction (HCC) 09/2010   resolved  . Type II diabetes mellitus (HCC)    a. 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 b. 05/2012 A1c 12.6.   Review of Systems:   Review of Systems  Constitutional: Negative for chills and fever.  Respiratory: Negative for shortness of breath.  Cardiovascular: Negative for chest pain, palpitations and leg swelling.  Gastrointestinal: Negative for abdominal pain, nausea and vomiting.  Genitourinary: Negative for dysuria, frequency and urgency.  Neurological: Negative for dizziness and headaches.    Physical Exam:  Vitals:   07/30/17 1325  BP: (!) 152/80  Pulse: 76  Temp: 98.8 F (37.1 C)  TempSrc: Oral  SpO2: 100%  Weight: 180 lb 9.6 oz (81.9 kg)  Height: 5\' 6"  (1.676 m)   Physical Exam  Constitutional: She is oriented to person, place, and time and well-developed, well-nourished, and in no distress.    Cardiovascular: An irregularly irregular rhythm present. Exam reveals no gallop and no friction rub.  Murmur (SEM pronounced with valsalva ) heard. Pulmonary/Chest: Effort normal and breath sounds normal. No respiratory distress. She has no wheezes. She has no rales.  Abdominal: Soft. Bowel sounds are normal. She exhibits no distension. There is no tenderness.  Musculoskeletal: She exhibits no edema.  Neurological: She is alert and oriented to person, place, and time.    Assessment & Plan:   See Encounters Tab for problem based charting.  Patient discussed with Dr. Oswaldo Done

## 2017-07-30 NOTE — Assessment & Plan Note (Addendum)
PAF: PAF: Rate controlled on metoprolol XL 50 mg QD. Also on Xarelto.  Denies chest pain, shortness of breath, and palpitations.  Follows up with cardiology.

## 2017-07-30 NOTE — Assessment & Plan Note (Signed)
>>  ASSESSMENT AND PLAN FOR ESSENTIAL HYPERTENSION WRITTEN ON 07/30/2017  4:39 PM BY SANTOS-SANCHEZ, IDALYS, MD  HTN, uncontrolled: On metoprolol  XL 50 mg QD and compliant. BP 152/80 today. Seen by her cardiologist in 04/2017 at which time verapamil  180 mg QD was added. However, she has not been taking this medication as she states the pharmacy told her she should not be on it. She does not have any known drug allergies. Called pharmacy [facility] and spoke to pharmacist. Verapamil  can increase Xarelto  levels in the blood and increase risk of bleeding. This, however, is not a contraindication for it. Patient will start verapamil . Counseled on increased risk of bleeding and advised her to call us  if she notices any bleeding.    - continue metoprolol  50 mg QD  - start verapamil  180 mg QD  - follow up in 3 months

## 2017-07-30 NOTE — Assessment & Plan Note (Signed)
Health maintenance: Patient states she has never had a Pap smear. Will perform at 3 month follow up.

## 2017-07-30 NOTE — Assessment & Plan Note (Signed)
HOCM: Patient follows up with Dr. Eden EmmsNishan with cardiology.  She was last seen on 04/21/2017.  She was experiencing chest pain at the time and a Myoview and TTE were ordered but patient has not been able to schedule this.  She is to follow-up with cardiology in October of this year (6 month follow up).  Denies active chest pain today.

## 2017-07-30 NOTE — Assessment & Plan Note (Signed)
HTN, uncontrolled: On metoprolol XL 50 mg QD and compliant. BP 152/80 today. Seen by her cardiologist in 04/2017 at which time verapamil 180 mg QD was added. However, she has not been taking this medication as she states the pharmacy told her she should not be on it. She does not have any known drug allergies. Called pharmacy [facility] and spoke to pharmacist. Verapamil can increase Xarelto levels in the blood and increase risk of bleeding. This, however, is not a contraindication for it. Patient will start verapamil. Counseled on increased risk of bleeding and advised her to call us if she notices any bleeding.    - continue metoprolol 50 mg QD  - start verapamil 180 mg QD  - follow up in 3 months

## 2017-07-30 NOTE — Assessment & Plan Note (Addendum)
T2DM, uncontrolled: Patient is doing well and denies increased thirst or urinary symptoms.  Currently on metformin 1000 mg BID, compliant.  Last A1c 10.9 in 3/19.  She recently completed a course of prednisone x 7 days for Bell's palsy as described below.  Her A1c prior to steroid use was 10.3.  Today her A1c is 9.7 and will therefore add a second hypoglycemic agent. Would benefit from SLGT-2 inhibitor given her history of heart disease. No history of DKA or LE wounds/ulcers. Normal renal function in recent blood work.  - Start Jardiance 10 mg QD  - Follow up urine microalbumin  - Foot and eye exam performed today  - Follow up in 3 months

## 2017-07-30 NOTE — Patient Instructions (Addendum)
[name],   I have called your pharmacy to check your blood pressure medicine.  You can now go pick up verapamil 180 mg daily at your pharmacy. If you notice new bleeding while you are on this medication, please call the clinic and let us know.   Your A1c is still high.  Continue taking your metformin as usual.  We will start Jardiance 10 mg daily to help with your blood sugars. I sent a prescription to your pharmacy.   Please make a follow-up appointment with me in 3 months.  At this visit we will check on your diabetes again and we will also do a Pap smear.  Please make sure to follow-up with your heart doctor in 2 to 3 months.  Please call us if you have any questions or concerns.   - Dr. Evelene CroonSantos

## 2017-07-31 LAB — MICROALBUMIN / CREATININE URINE RATIO
Creatinine, Urine: 147.1 mg/dL
Microalb/Creat Ratio: 37.6 mg/g creat — ABNORMAL HIGH (ref 0.0–30.0)
Microalbumin, Urine: 55.3 ug/mL

## 2017-08-02 NOTE — Progress Notes (Signed)
Internal Medicine Clinic Attending  Case discussed with Dr. Santos-Sanchez at the time of the visit.  We reviewed the resident's history and exam and pertinent patient test results.  I agree with the assessment, diagnosis, and plan of care documented in the resident's note.    

## 2017-08-11 ENCOUNTER — Encounter: Payer: Self-pay | Admitting: Dietician

## 2017-09-10 MED FILL — metFORMIN HCL 1000 MG TABS: 1000 | 30 days supply | Qty: 60 | Fill #1

## 2017-09-10 MED FILL — METOPROLOL SUCCINATE ER 50: 50 | 30 days supply | Qty: 30 | Fill #1

## 2017-09-10 MED FILL — XARELTO 20 MG TABLET: 20 | 30 days supply | Qty: 30 | Fill #1

## 2017-11-05 MED FILL — VERAPAMIL ER 180 MG TABLET: 180 | 90 days supply | Qty: 90 | Fill #1

## 2017-11-05 MED FILL — METOPROLOL SUCCINATE ER 50: 50 | 30 days supply | Qty: 30 | Fill #2

## 2017-11-05 MED FILL — metFORMIN HCL 1000 MG TABS: 1000 | 30 days supply | Qty: 60 | Fill #2

## 2017-11-05 MED FILL — XARELTO 20 MG TABLET: 20 | 30 days supply | Qty: 30 | Fill #2

## 2017-12-20 ENCOUNTER — Other Ambulatory Visit: Payer: Self-pay | Admitting: Internal Medicine

## 2017-12-20 DIAGNOSIS — I10 Essential (primary) hypertension: Secondary | ICD-10-CM

## 2017-12-20 NOTE — Telephone Encounter (Signed)
Spoke with the patient.  She has been sch for 01/14/2017.

## 2017-12-21 MED FILL — METOPROLOL SUCCINATE ER 50: 50 | 30 days supply | Qty: 30 | Fill #0

## 2018-01-14 ENCOUNTER — Encounter (INDEPENDENT_AMBULATORY_CARE_PROVIDER_SITE_OTHER): Payer: Self-pay

## 2018-01-14 ENCOUNTER — Ambulatory Visit: Payer: BC Managed Care – PPO | Admitting: Internal Medicine

## 2018-01-14 ENCOUNTER — Encounter: Payer: Self-pay | Admitting: Internal Medicine

## 2018-01-14 ENCOUNTER — Other Ambulatory Visit: Payer: Self-pay

## 2018-01-14 VITALS — BP 155/86 | HR 67 | Temp 98.2°F | Ht 66.0 in | Wt 180.9 lb

## 2018-01-14 DIAGNOSIS — E119 Type 2 diabetes mellitus without complications: Secondary | ICD-10-CM

## 2018-01-14 DIAGNOSIS — Z23 Encounter for immunization: Secondary | ICD-10-CM

## 2018-01-14 DIAGNOSIS — I480 Paroxysmal atrial fibrillation: Secondary | ICD-10-CM

## 2018-01-14 DIAGNOSIS — Z7901 Long term (current) use of anticoagulants: Secondary | ICD-10-CM

## 2018-01-14 DIAGNOSIS — I421 Obstructive hypertrophic cardiomyopathy: Secondary | ICD-10-CM

## 2018-01-14 DIAGNOSIS — I10 Essential (primary) hypertension: Secondary | ICD-10-CM | POA: Diagnosis not present

## 2018-01-14 DIAGNOSIS — I4891 Unspecified atrial fibrillation: Secondary | ICD-10-CM

## 2018-01-14 DIAGNOSIS — Z7984 Long term (current) use of oral hypoglycemic drugs: Secondary | ICD-10-CM

## 2018-01-14 DIAGNOSIS — Z79899 Other long term (current) drug therapy: Secondary | ICD-10-CM

## 2018-01-14 DIAGNOSIS — Z0000 Encounter for general adult medical examination without abnormal findings: Secondary | ICD-10-CM

## 2018-01-14 LAB — POCT GLYCOSYLATED HEMOGLOBIN (HGB A1C): Hemoglobin A1C: 10.8 % — AB (ref 4.0–5.6)

## 2018-01-14 LAB — GLUCOSE, CAPILLARY: Glucose-Capillary: 187 mg/dL — ABNORMAL HIGH (ref 70–99)

## 2018-01-14 MED ORDER — METOPROLOL SUCCINATE ER 50 MG PO TB24: ORAL_TABLET | ORAL | 2 refills | Status: DC

## 2018-01-14 MED ORDER — METFORMIN HCL 1000 MG PO TABS: 1000.0000 mg | ORAL_TABLET | Freq: Two times a day (BID) | ORAL | 5 refills | Status: DC

## 2018-01-14 MED ORDER — EMPAGLIFLOZIN 10 MG PO TABS: 10.0000 mg | ORAL_TABLET | Freq: Every day | ORAL | 2 refills | Status: DC

## 2018-01-14 NOTE — Patient Instructions (Addendum)
Ms. Bille,   Your A1c went from 9.6-10.7.  Because of this I would like to add another diabetes medication to your regimen called Jardiance.  You will take 1 tablet a day.  You can take it either in the morning or at night.  Continue taking metformin as usual as well.  For your blood pressure please continue taking metoprolol and verapamil as usual.  You received your flu shot today.  I will give you a call with the results of your blood work.  Follow-up with me in 3 months.  Remember that we will do a Pap smear at this visit.  Please call us if you have any questions or concerns.  -Dr. Evelene Croon

## 2018-01-15 LAB — BMP8+ANION GAP
ANION GAP: 17 mmol/L (ref 10.0–18.0)
BUN/Creatinine Ratio: 9 (ref 9–23)
BUN: 9 mg/dL (ref 6–24)
CO2: 23 mmol/L (ref 20–29)
Calcium: 9.9 mg/dL (ref 8.7–10.2)
Chloride: 97 mmol/L (ref 96–106)
Creatinine, Ser: 0.98 mg/dL (ref 0.57–1.00)
GFR calc Af Amer: 79 mL/min/{1.73_m2} (ref >59)
GFR calc non Af Amer: 69 mL/min/{1.73_m2} (ref >59)
GLUCOSE: 182 mg/dL — AB (ref 65–99)
Potassium: 4.3 mmol/L (ref 3.5–5.2)
Sodium: 137 mmol/L (ref 134–144)

## 2018-01-16 ENCOUNTER — Encounter: Payer: Self-pay | Admitting: Internal Medicine

## 2018-01-16 NOTE — Assessment & Plan Note (Addendum)
BP initially elevated when patient arrived to clinic and normal when repeated. Will continue metoprolol and verapamil. Denies signs/symptoms of bleeding (verapamil can increase Xarelto levels in bloodstream). BMP ordered to check renal function.

## 2018-01-16 NOTE — Assessment & Plan Note (Addendum)
T2DM remains uncontrolled. On max dose of metformin.  A1c 10.8 from 9.7 5 months ago. She was prescribed Jardiance during last visit but states she did not receive it when she went to pick up her medications at her pharmacy. Denies issues affording medications. Refilled Jardiance and discussed side effects. Also called pharmacy to ensure they receive prescription. Follow up in 3 months.

## 2018-01-16 NOTE — Assessment & Plan Note (Addendum)
Rate controlled on metoprolol and verapamil. Also on Xarelto for [facility]. Follows up with Dr. Eden Emms.

## 2018-01-16 NOTE — Progress Notes (Signed)
   CC: HTN and DM follow up  HPI:  [name] is a 48 y.o. female with PMH listed below who presents to clinic for follow up of HTN and DM.   Past Medical History:  Diagnosis Date  . Bell's palsy 03/2017  . Gallstones   . Gestational diabetes mellitus (GDM) 2005; 2006  . Headache    "monthly" (03/12/2017)  . History of chest pain   . History of prolonged Q-T interval on ECG    a. 09/2012 adm: QTC 506.  Marland Kitchen Hypertension   . PAF (paroxysmal atrial fibrillation) (HCC) 2012   a. 07/2011 ED visit with AF-> converted with flecainide, never followed-up;  b. 05/2012 Recurrent Afib -echo: EF 65-60%, mildly dil LA. Placed on Xarelto/daily Flecainide. c. 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  . Small bowel obstruction (HCC) 09/2010   resolved  . Type II diabetes mellitus (HCC)    a. 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 b. 05/2012 A1c 12.6.   Review of Systems:   Review of Systems  Constitutional: Negative for chills and fever.  Respiratory: Negative for cough and shortness of breath.   Cardiovascular: Negative for chest pain, palpitations, orthopnea and leg swelling.  Gastrointestinal: Negative for abdominal pain, nausea and vomiting.  Genitourinary: Negative for frequency and urgency.  Neurological: Negative for dizziness and headaches.    Physical Exam:  Vitals:   01/14/18 1514  BP: (!) 155/86  Pulse: 67  Temp: 98.2 F (36.8 C)  TempSrc: Oral  SpO2: 100%  Weight: 180 lb 14.4 oz (82.1 kg)  Height: 5\' 6"  (1.676 m)   General: pleasant female, in NAD CV: RRR, SEM that increases with Valsalva unchanged  Pulm: CTAB, no wheezes or crackles  Ext: warm and wel perfused without peripheral edema   Assessment & Plan:   See Encounters Tab for problem based charting.  Patient discussed with Dr. Oswaldo Done

## 2018-01-16 NOTE — Assessment & Plan Note (Signed)
>>  ASSESSMENT AND PLAN FOR ESSENTIAL HYPERTENSION WRITTEN ON 01/16/2018  8:36 PM BY SANTOS-SANCHEZ, IDALYS, MD  BP initially elevated when patient arrived to clinic and normal when repeated. Will continue metoprolol  and verapamil . Denies signs/symptoms of bleeding (verapamil  can increase Xarelto  levels in bloodstream). BMP ordered to check renal function.

## 2018-01-16 NOTE — Assessment & Plan Note (Signed)
Denies chest pain, DOE, palpitations, fatigue, pre-syncope or syncope. Follows up with Dr. Eden Emms.

## 2018-01-16 NOTE — Assessment & Plan Note (Signed)
Flu shot given today. Has never had a pap smear, declined it today. Agreed to it at next visit.

## 2018-01-17 NOTE — Progress Notes (Signed)
Internal Medicine Clinic Attending  Case discussed with Dr. Santos-Sanchez at the time of the visit.  We reviewed the resident's history and exam and pertinent patient test results.  I agree with the assessment, diagnosis, and plan of care documented in the resident's note.    

## 2018-01-27 ENCOUNTER — Telehealth: Payer: Self-pay | Admitting: Internal Medicine

## 2018-02-07 MED FILL — JARDIANCE 10 MG TABLET: 10 | 30 days supply | Qty: 30 | Fill #0

## 2018-02-07 MED FILL — metFORMIN HCL 1000 MG TABS: 1000 | 30 days supply | Qty: 60 | Fill #0

## 2018-02-07 MED FILL — METOPROLOL SUCCINATE ER 50: 50 | 30 days supply | Qty: 30 | Fill #0

## 2018-03-01 ENCOUNTER — Encounter: Payer: Self-pay | Admitting: Internal Medicine

## 2018-03-11 MED FILL — metFORMIN HCL 1000 MG TABS: 1000 | 30 days supply | Qty: 60 | Fill #0

## 2018-03-11 MED FILL — XARELTO 20 MG TABLET: 20 | 30 days supply | Qty: 30 | Fill #3

## 2018-03-11 MED FILL — METOPROLOL SUCCINATE ER 50: 50 | 30 days supply | Qty: 30 | Fill #0

## 2018-03-11 MED FILL — JARDIANCE 10 MG TABLET: 10 | 30 days supply | Qty: 30 | Fill #0

## 2018-03-26 ENCOUNTER — Ambulatory Visit (HOSPITAL_COMMUNITY)
Admission: EM | Admit: 2018-03-26 | Discharge: 2018-03-26 | Disposition: A | Discharge status: Home / Self Care | Payer: BC Managed Care – PPO | Attending: Family Medicine | Admitting: Family Medicine

## 2018-03-26 ENCOUNTER — Encounter (HOSPITAL_COMMUNITY): Payer: Self-pay

## 2018-03-26 DIAGNOSIS — K089 Disorder of teeth and supporting structures, unspecified: Secondary | ICD-10-CM | POA: Diagnosis not present

## 2018-03-26 DIAGNOSIS — R51 Headache: Secondary | ICD-10-CM

## 2018-03-26 DIAGNOSIS — R519 Headache, unspecified: Secondary | ICD-10-CM

## 2018-03-26 MED ORDER — CLINDAMYCIN HCL 300 MG PO CAPS: 300.0000 mg | ORAL_CAPSULE | Freq: Three times a day (TID) | ORAL | 0 refills | Status: DC

## 2018-03-26 MED ORDER — TRAMADOL HCL 50 MG PO TABS: 50.0000 mg | ORAL_TABLET | Freq: Four times a day (QID) | ORAL | 0 refills | Status: DC | PRN

## 2018-03-26 MED FILL — traMADol HCL 50 MG TABS: 50 | 4 days supply | Qty: 15 | Fill #0

## 2018-03-26 MED FILL — CLINDAMYCIN HCL 300 MG CAPS: 300 | 10 days supply | Qty: 30 | Fill #0

## 2018-03-26 NOTE — Discharge Instructions (Addendum)
Continue to work the tooth and hopefully you can extract it yourself. I've sent in an antibiotic and pain med to the pharmacy.

## 2018-03-26 NOTE — ED Triage Notes (Signed)
Pt present cough and headache, symptoms started two days ago.

## 2018-03-26 NOTE — ED Provider Notes (Addendum)
03/26/2018 2:08 PM   DOB: [birth year] / MRN: [id number]  SUBJECTIVE:  [name] is a 48 y.o. female presenting for tooth pain and HA.  This is present for about 1 week and worsening. Has not tried meds.   She has No Known Allergies.   She  has a past medical history of Bell's palsy (03/2017), Gallstones, Gestational diabetes mellitus (GDM) (2005; 2006), Headache, History of chest pain, History of prolonged Q-T interval on ECG, Hypertension, PAF (paroxysmal atrial fibrillation) (HCC) (2012), Small bowel obstruction (HCC) (09/2010), and Type II diabetes mellitus (HCC).    She  reports that she has never smoked. She has never used smokeless tobacco. She reports that she does not drink alcohol or use drugs. She  reports previously being sexually active and has had partner(s) who are Female. The patient  has a past surgical history that includes Ventral hernia repair (02/05/2005); Cesarean section (2005; 2006); Hernia repair; and Cholecystectomy (N/A, 03/25/2017).  Her family history includes Asthma in her mother; Other in her father, sister, and sister.  Review of Systems  HENT: Negative for hearing loss.   Neurological: Positive for headaches. Negative for dizziness, tingling, tremors, sensory change, speech change, focal weakness, seizures, loss of consciousness and weakness.    OBJECTIVE:  BP (!) 159/89 (BP Location: Left Arm)   Pulse 85   Temp 98.8 F (37.1 C) (Oral)   Resp 18   LMP 10/01/2006   SpO2 100%   Wt Readings from Last 3 Encounters:  01/14/18 180 lb 14.4 oz (82.1 kg)  07/30/17 180 lb 9.6 oz (81.9 kg)  07/13/17 180 lb (81.6 kg)   Temp Readings from Last 3 Encounters:  03/26/18 98.8 F (37.1 C) (Oral)  01/14/18 98.2 F (36.8 C) (Oral)  07/30/17 98.8 F (37.1 C) (Oral)   BP Readings from Last 3 Encounters:  03/26/18 (!) 159/89  01/14/18 (!) 155/86  07/30/17 (!) 152/80   Pulse Readings from Last 3 Encounters:  03/26/18 85  01/14/18 67  07/30/17 76    Physical  Exam Vitals signs reviewed.  Constitutional:      General: She is not in acute distress.    Appearance: She is not diaphoretic.  HENT:     Mouth/Throat:   Eyes:     Pupils: Pupils are equal, round, and reactive to light.  Cardiovascular:     Rate and Rhythm: Normal rate.  Pulmonary:     Effort: Pulmonary effort is normal.  Abdominal:     General: There is no distension.  Skin:    General: Skin is dry.  Neurological:     Mental Status: She is alert and oriented to person, place, and time.     Cranial Nerves: No cranial nerve deficit.     Gait: Gait normal.     No results found for this or any previous visit (from the past 72 hour(s)).  No results found.  ASSESSMENT AND PLAN:   Poor dentition: Startined abx and providing tramadol given xarelto. Pressure likely elevated 2/2 pain.   Nonintractable headache, unspecified chronicity pattern, unspecified headache type    Discharge Instructions     Continue to work the tooth and hopefully you can extract it yourself. I've sent in an antibiotic and pain med to the pharmacy.        The patient is advised to call or return to clinic if she does not see an improvement in symptoms, or to seek the care of the closest emergency department if she worsens with  the above plan.   Deliah Boston, MHS, PA-C 03/26/2018 2:08 PM   Ofilia Neas, PA-C 03/26/18 1411    Ofilia Neas, PA-C 03/26/18 [phone]

## 2018-04-03 ENCOUNTER — Encounter (HOSPITAL_COMMUNITY): Payer: Self-pay | Admitting: Emergency Medicine

## 2018-04-03 ENCOUNTER — Other Ambulatory Visit: Payer: Self-pay

## 2018-04-03 ENCOUNTER — Observation Stay (HOSPITAL_COMMUNITY)
Admission: EM | Admit: 2018-04-03 | Discharge: 2018-04-04 | Disposition: A | Discharge status: Home / Self Care | Payer: BC Managed Care – PPO | Attending: Internal Medicine | Admitting: Internal Medicine

## 2018-04-03 ENCOUNTER — Emergency Department (HOSPITAL_COMMUNITY): Payer: BC Managed Care – PPO

## 2018-04-03 DIAGNOSIS — Z79899 Other long term (current) drug therapy: Secondary | ICD-10-CM | POA: Diagnosis not present

## 2018-04-03 DIAGNOSIS — R002 Palpitations: Secondary | ICD-10-CM | POA: Diagnosis present

## 2018-04-03 DIAGNOSIS — R079 Chest pain, unspecified: Secondary | ICD-10-CM | POA: Diagnosis present

## 2018-04-03 DIAGNOSIS — I4891 Unspecified atrial fibrillation: Secondary | ICD-10-CM | POA: Diagnosis present

## 2018-04-03 DIAGNOSIS — E2609 Other primary hyperaldosteronism: Secondary | ICD-10-CM | POA: Diagnosis present

## 2018-04-03 DIAGNOSIS — M549 Dorsalgia, unspecified: Principal | ICD-10-CM | POA: Diagnosis present

## 2018-04-03 DIAGNOSIS — R0789 Other chest pain: Secondary | ICD-10-CM

## 2018-04-03 DIAGNOSIS — I10 Essential (primary) hypertension: Secondary | ICD-10-CM | POA: Diagnosis present

## 2018-04-03 DIAGNOSIS — Z7901 Long term (current) use of anticoagulants: Secondary | ICD-10-CM | POA: Diagnosis not present

## 2018-04-03 DIAGNOSIS — G510 Bell's palsy: Secondary | ICD-10-CM | POA: Diagnosis not present

## 2018-04-03 DIAGNOSIS — I421 Obstructive hypertrophic cardiomyopathy: Secondary | ICD-10-CM | POA: Diagnosis present

## 2018-04-03 DIAGNOSIS — Z9049 Acquired absence of other specified parts of digestive tract: Secondary | ICD-10-CM | POA: Diagnosis not present

## 2018-04-03 DIAGNOSIS — J189 Pneumonia, unspecified organism: Secondary | ICD-10-CM | POA: Insufficient documentation

## 2018-04-03 DIAGNOSIS — E1169 Type 2 diabetes mellitus with other specified complication: Secondary | ICD-10-CM

## 2018-04-03 DIAGNOSIS — E119 Type 2 diabetes mellitus without complications: Secondary | ICD-10-CM | POA: Insufficient documentation

## 2018-04-03 DIAGNOSIS — I480 Paroxysmal atrial fibrillation: Secondary | ICD-10-CM | POA: Insufficient documentation

## 2018-04-03 DIAGNOSIS — Z7984 Long term (current) use of oral hypoglycemic drugs: Secondary | ICD-10-CM | POA: Diagnosis not present

## 2018-04-03 LAB — COMPREHENSIVE METABOLIC PANEL
ALT: 26 U/L (ref 0–44)
AST: 28 U/L (ref 15–41)
Albumin: 3.8 g/dL (ref 3.5–5.0)
Alkaline Phosphatase: 102 U/L (ref 38–126)
Anion gap: 11 (ref 5–15)
BUN: 12 mg/dL (ref 6–20)
CO2: 25 mmol/L (ref 22–32)
Calcium: 9.6 mg/dL (ref 8.9–10.3)
Chloride: 101 mmol/L (ref 98–111)
Creatinine, Ser: 0.9 mg/dL (ref 0.44–1.00)
GFR calc Af Amer: >60 mL/min (ref >60)
GFR calc non Af Amer: >60 mL/min (ref >60)
Glucose, Bld: 158 mg/dL — ABNORMAL HIGH (ref 70–99)
Potassium: 4.2 mmol/L (ref 3.5–5.1)
Sodium: 137 mmol/L (ref 135–145)
Total Bilirubin: 0.5 mg/dL (ref 0.3–1.2)
Total Protein: 7.9 g/dL (ref 6.5–8.1)

## 2018-04-03 LAB — CBC WITH DIFFERENTIAL/PLATELET
ABS IMMATURE GRANULOCYTES: 0.03 10*3/uL (ref 0.00–0.07)
Basophils Absolute: 0.1 10*3/uL (ref 0.0–0.1)
Basophils Relative: 1 %
Eosinophils Absolute: 0.2 10*3/uL (ref 0.0–0.5)
Eosinophils Relative: 2 %
HCT: 40.7 % (ref 36.0–46.0)
Hemoglobin: 12.9 g/dL (ref 12.0–15.0)
IMMATURE GRANULOCYTES: 0 %
LYMPHS ABS: 3.8 10*3/uL (ref 0.7–4.0)
Lymphocytes Relative: 37 %
MCH: 25.4 pg — ABNORMAL LOW (ref 26.0–34.0)
MCHC: 31.7 g/dL (ref 30.0–36.0)
MCV: 80.3 fL (ref 80.0–100.0)
Monocytes Absolute: 0.8 10*3/uL (ref 0.1–1.0)
Monocytes Relative: 8 %
Neutro Abs: 5.5 10*3/uL (ref 1.7–7.7)
Neutrophils Relative %: 52 %
Platelets: 250 10*3/uL (ref 150–400)
RBC: 5.07 MIL/uL (ref 3.87–5.11)
RDW: 14.2 % (ref 11.5–15.5)
WBC: 10.4 10*3/uL (ref 4.0–10.5)
nRBC: 0 % (ref 0.0–0.2)

## 2018-04-03 LAB — LIPID PANEL
Cholesterol: 135 mg/dL (ref 0–200)
HDL: 56 mg/dL (ref >40)
LDL Cholesterol: 26 mg/dL (ref 0–99)
Total CHOL/HDL Ratio: 2.4 RATIO
Triglycerides: 266 mg/dL — ABNORMAL HIGH (ref <150)
VLDL: 53 mg/dL — ABNORMAL HIGH (ref 0–40)

## 2018-04-03 LAB — TROPONIN I

## 2018-04-03 MED ORDER — SODIUM CHLORIDE 0.9% FLUSH: 3.0000 mL | Freq: Two times a day (BID) | INTRAVENOUS | Status: DC

## 2018-04-03 MED ORDER — ACETAMINOPHEN 325 MG PO TABS: 650.0000 mg | ORAL_TABLET | Freq: Four times a day (QID) | ORAL | Status: DC | PRN

## 2018-04-03 MED ORDER — METOPROLOL SUCCINATE ER 50 MG PO TB24
50.0000 mg | ORAL_TABLET | Freq: Every day | ORAL | Status: DC
  Administered 2018-04-04 (×2): 50 mg via ORAL
  Filled 2018-04-03 (×2): qty 1

## 2018-04-03 MED ORDER — POLYETHYLENE GLYCOL 3350 17 G PO PACK: 17.0000 g | PACK | Freq: Every day | ORAL | Status: DC | PRN

## 2018-04-03 MED ORDER — METOPROLOL SUCCINATE ER 25 MG PO TB24: 50.0000 mg | ORAL_TABLET | Freq: Every day | ORAL | Status: DC

## 2018-04-03 MED ORDER — ONDANSETRON HCL 4 MG/2ML IJ SOLN: 4.0000 mg | Freq: Four times a day (QID) | INTRAMUSCULAR | Status: DC | PRN

## 2018-04-03 MED ORDER — ONDANSETRON HCL 4 MG PO TABS: 4.0000 mg | ORAL_TABLET | Freq: Four times a day (QID) | ORAL | Status: DC | PRN

## 2018-04-03 MED ORDER — SODIUM CHLORIDE 0.9% FLUSH: 3.0000 mL | INTRAVENOUS | Status: DC | PRN

## 2018-04-03 MED ORDER — ACETAMINOPHEN 650 MG RE SUPP: 650.0000 mg | Freq: Four times a day (QID) | RECTAL | Status: DC | PRN

## 2018-04-03 MED ORDER — VERAPAMIL HCL ER 180 MG PO TBCR
180.0000 mg | EXTENDED_RELEASE_TABLET | Freq: Every day | ORAL | Status: DC
  Administered 2018-04-03 – 2018-04-04 (×2): 180 mg via ORAL
  Filled 2018-04-03 (×2): qty 1

## 2018-04-03 MED ORDER — RIVAROXABAN 20 MG PO TABS
20.0000 mg | ORAL_TABLET | Freq: Every day | ORAL | Status: DC
  Administered 2018-04-03: 20 mg via ORAL
  Filled 2018-04-03: qty 1

## 2018-04-03 NOTE — ED Triage Notes (Signed)
Pt here for chest pain that radiates from left side of back to front. Pt states she has had body aches in "knees" and having sweating episodes, but denies fevers. Denies travel or sick contacts

## 2018-04-03 NOTE — ED Provider Notes (Signed)
[facility] EMERGENCY DEPARTMENT Provider Note   CSN: [id number] Arrival date & time: 04/03/18  1525    History   Chief Complaint No chief complaint on file.   HPI [name] is a 48 y.o. female.     HPI  Past Medical History:  Diagnosis Date  . Bell's palsy 03/2017  . Gallstones   . Gestational diabetes mellitus (GDM) 2005; 2006  . Headache    "monthly" (03/12/2017)  . History of chest pain   . History of prolonged Q-T interval on ECG    a. 09/2012 adm: QTC 506.  Marland Kitchen Hypertension   . PAF (paroxysmal atrial fibrillation) (HCC) 2012   a. 07/2011 ED visit with AF-> converted with flecainide, never followed-up;  b. 05/2012 Recurrent Afib -echo: EF 65-60%, mildly dil LA. Placed on Xarelto/daily Flecainide. c. 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  . Small bowel obstruction (HCC) 09/2010   resolved  . Type II diabetes mellitus (HCC)    a. 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 b. 05/2012 A1c 12.6.    Patient Active Problem List   Diagnosis Date Noted  . HOCM (hypertrophic obstructive cardiomyopathy) (HCC) 04/03/2017  . Bell's palsy 03/11/2017  . Healthcare maintenance 01/17/2017  . Cholelithiasis 01/15/2017  . Murmur 03/07/2014  . Atrial fibrillation (HCC) 05/23/2012  . Essential hypertension 05/23/2012  . Diabetes mellitus (HCC) 05/23/2012    Past Surgical History:  Procedure Laterality Date  . CESAREAN SECTION  2005; 2006  . CHOLECYSTECTOMY N/A 03/25/2017   Procedure: LAPAROSCOPIC CHOLECYSTECTOMY;  Surgeon: Kinsinger, De Blanch, MD;  Location: [facility];  Service: General;  Laterality: N/A;  . HERNIA REPAIR    . VENTRAL HERNIA REPAIR  02/05/2005   with mesh     OB History   No obstetric history on file.      Home Medications    Prior to Admission medications   Medication Sig Start Date End Date Taking? Authorizing Provider  artificial tears (LACRILUBE) OINT ophthalmic ointment Place into  the right eye 3 (three) times daily. 07/13/17   Angelita Ingles, MD  clindamycin (CLEOCIN) 300 MG capsule Take 1 capsule (300 mg total) by mouth 3 (three) times daily. 03/26/18   Ofilia Neas, PA-C  empagliflozin (JARDIANCE) 10 MG TABS tablet Take 10 mg by mouth daily. 01/14/18   Burna Cash, MD  hydroxypropyl methylcellulose / hypromellose (ISOPTO TEARS / GONIOVISC) 2.5 % ophthalmic solution Place 1 drop into the right eye 4 (four) times daily as needed for dry eyes. 07/13/17   Angelita Ingles, MD  metFORMIN (GLUCOPHAGE) 1000 MG tablet Take 1 tablet (1,000 mg total) by mouth 2 (two) times daily with a meal. 01/14/18   Santos-Sanchez, Chelsea Primus, MD  metoprolol succinate (TOPROL-XL) 50 MG 24 hr tablet TAKE 1 TABLET BY MOUTH DAILY. TAKE WITH OR IMMEDIATELY FOLLOWING A MEAL. 01/14/18   Santos-Sanchez, Chelsea Primus, MD  Propylene Glycol (SYSTANE COMPLETE) 0.6 % SOLN Apply 1 drop to eye QID. 03/18/17   Arnetha Courser, MD  rivaroxaban (XARELTO) 20 MG TABS tablet Take 1 tablet (20 mg total) by mouth daily with supper. 07/30/17   Santos-Sanchez, Chelsea Primus, MD  traMADol (ULTRAM) 50 MG tablet Take 1 tablet (50 mg total) by mouth every 6 (six) hours as needed. 03/26/18   Ofilia Neas, PA-C  verapamil (CALAN-SR) 180 MG CR tablet Take 1 tablet (180 mg total) by mouth daily. 04/21/17   Wendall Stade, MD    Family  History Family History  Problem Relation Age of Onset  . Other Father        killed in [place] civil war when he was in his 73's  . Asthma Mother        alive in her 6's  . Other Sister        A & W  . Other Sister        A & W  . Heart disease Neg Hx     Social History Social History   Tobacco Use  . Smoking status: Never Smoker  . Smokeless tobacco: Never Used  Substance Use Topics  . Alcohol use: Never    Frequency: Never  . Drug use: Never     Allergies   Patient has no known allergies.   Review of Systems Review of Systems   Physical Exam Updated Vital Signs LMP  10/01/2006   Physical Exam   ED Treatments / Results  Labs (all labs ordered are listed, but only abnormal results are displayed) Labs Reviewed - No data to display  EKG None  Radiology No results found.  Procedures Procedures (including critical care time)  Medications Ordered in ED Medications - No data to display   Initial Impression / Assessment and Plan / ED Course  I have reviewed the triage vital signs and the nursing notes.  Pertinent labs & imaging results that were available during my care of the patient were reviewed by me and considered in my medical decision making (see chart for details).     Tashianna T Pen   Final Clinical Impressions(s) / ED Diagnoses   Final diagnoses:  None    ED Discharge Orders    None       Virgina Norfolk, DO 04/04/18 1507

## 2018-04-03 NOTE — ED Provider Notes (Signed)
[facility] EMERGENCY DEPARTMENT Provider Note   CSN: [id number] Arrival date & time: 04/03/18  1525    History   Chief Complaint Chief Complaint  Patient presents with  . Chest Pain    HPI [name] is a 48 y.o. female.     Patient states that twice in the last 24 hours she is been having some palpitations and also pain radiating into her back upper back.  The history is provided by the patient. No language interpreter was used.  Palpitations  Palpitations quality:  Irregular Onset quality:  Sudden Timing:  Rare Progression:  Resolved Chronicity:  Recurrent Context: not anxiety   Relieved by:  Nothing Worsened by:  Nothing Ineffective treatments:  None tried Associated symptoms: back pain   Associated symptoms: no chest pain and no cough   Risk factors: heart disease     Past Medical History:  Diagnosis Date  . Bell's palsy 03/2017  . Gallstones   . Gestational diabetes mellitus (GDM) 2005; 2006  . Headache    "monthly" (03/12/2017)  . History of chest pain   . History of prolonged Q-T interval on ECG    a. 09/2012 adm: QTC 506.  Marland Kitchen Hypertension   . PAF (paroxysmal atrial fibrillation) (HCC) 2012   a. 07/2011 ED visit with AF-> converted with flecainide, never followed-up;  b. 05/2012 Recurrent Afib -echo: EF 65-60%, mildly dil LA. Placed on Xarelto/daily Flecainide. c. 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  . Small bowel obstruction (HCC) 09/2010   resolved  . Type II diabetes mellitus (HCC)    a. 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 b. 05/2012 A1c 12.6.    Patient Active Problem List   Diagnosis Date Noted  . HOCM (hypertrophic obstructive cardiomyopathy) (HCC) 04/03/2017  . Bell's palsy 03/11/2017  . Healthcare maintenance 01/17/2017  . Cholelithiasis 01/15/2017  . Murmur 03/07/2014  . Atrial fibrillation (HCC) 05/23/2012  . Essential hypertension 05/23/2012  . Diabetes mellitus (HCC) 05/23/2012    Past Surgical History:  Procedure Laterality Date  . CESAREAN SECTION  2005; 2006  . CHOLECYSTECTOMY N/A 03/25/2017   Procedure: LAPAROSCOPIC CHOLECYSTECTOMY;  Surgeon: Kinsinger, De Blanch, MD;  Location: [facility];  Service: General;  Laterality: N/A;  . HERNIA REPAIR    . VENTRAL HERNIA REPAIR  02/05/2005   with mesh     OB History   No obstetric history on file.      Home Medications    Prior to Admission medications   Medication Sig Start Date End Date Taking? Authorizing Provider  clindamycin (CLEOCIN) 300 MG capsule Take 1 capsule (300 mg total) by mouth 3 (three) times daily. 03/26/18  Yes Ofilia Neas, PA-C  empagliflozin (JARDIANCE) 10 MG TABS tablet Take 10 mg by mouth daily. 01/14/18  Yes Santos-Sanchez, Chelsea Primus, MD  metFORMIN (GLUCOPHAGE) 1000 MG tablet Take 1 tablet (1,000 mg total) by mouth 2 (two) times daily with a meal. 01/14/18  Yes Santos-Sanchez, Idalys, MD  metoprolol succinate (TOPROL-XL) 50 MG 24 hr tablet TAKE 1 TABLET BY MOUTH DAILY. TAKE WITH OR IMMEDIATELY FOLLOWING A MEAL. 01/14/18  Yes Santos-Sanchez, Chelsea Primus, MD  rivaroxaban (XARELTO) 20 MG TABS tablet Take 1 tablet (20 mg total) by mouth daily with supper. 07/30/17  Yes Santos-Sanchez, Chelsea Primus, MD  verapamil (CALAN-SR) 180 MG CR tablet Take 1 tablet (180 mg total) by mouth daily. 04/21/17  Yes Wendall Stade, MD  artificial tears (LACRILUBE) OINT ophthalmic ointment Place  into the right eye 3 (three) times daily. Patient not taking: Reported on 04/03/2018 07/13/17   Angelita Ingles, MD  hydroxypropyl methylcellulose / hypromellose (ISOPTO TEARS / GONIOVISC) 2.5 % ophthalmic solution Place 1 drop into the right eye 4 (four) times daily as needed for dry eyes. Patient not taking: Reported on 04/03/2018 07/13/17   Angelita Ingles, MD  Propylene Glycol (SYSTANE COMPLETE) 0.6 % SOLN Apply 1 drop to eye QID. Patient not taking: Reported on 04/03/2018 03/18/17   Arnetha Courser, MD  traMADol  (ULTRAM) 50 MG tablet Take 1 tablet (50 mg total) by mouth every 6 (six) hours as needed. Patient not taking: Reported on 04/03/2018 03/26/18   Ofilia Neas, PA-C    Family History Family History  Problem Relation Age of Onset  . Other Father        killed in [place] civil war when he was in his 12's  . Asthma Mother        alive in her 24's  . Other Sister        A & W  . Other Sister        A & W  . Heart disease Neg Hx     Social History Social History   Tobacco Use  . Smoking status: Never Smoker  . Smokeless tobacco: Never Used  Substance Use Topics  . Alcohol use: Never    Frequency: Never  . Drug use: Never     Allergies   Patient has no known allergies.   Review of Systems Review of Systems  Constitutional: Negative for appetite change and fatigue.  HENT: Negative for congestion, ear discharge and sinus pressure.   Eyes: Negative for discharge.  Respiratory: Negative for cough.   Cardiovascular: Positive for palpitations. Negative for chest pain.  Gastrointestinal: Negative for abdominal pain and diarrhea.  Genitourinary: Negative for frequency and hematuria.  Musculoskeletal: Positive for back pain.  Skin: Negative for rash.  Neurological: Negative for seizures and headaches.  Psychiatric/Behavioral: Negative for hallucinations.     Physical Exam Updated Vital Signs BP (!) 154/96 (BP Location: Right Arm)   Pulse 77   Temp 98.5 F (36.9 C) (Oral)   Resp 14   LMP 10/01/2006   SpO2 99%   Physical Exam Vitals signs and nursing note reviewed.  Constitutional:      Appearance: She is well-developed.  HENT:     Head: Normocephalic.     Nose: Nose normal.  Eyes:     General: No scleral icterus.    Conjunctiva/sclera: Conjunctivae normal.  Neck:     Musculoskeletal: Neck supple.     Thyroid: No thyromegaly.  Cardiovascular:     Rate and Rhythm: Normal rate and regular rhythm.     Heart sounds: No murmur. No friction rub. No gallop.    Pulmonary:     Breath sounds: No stridor. No wheezing or rales.  Chest:     Chest wall: No tenderness.  Abdominal:     General: There is no distension.     Tenderness: There is no abdominal tenderness. There is no rebound.  Musculoskeletal: Normal range of motion.  Lymphadenopathy:     Cervical: No cervical adenopathy.  Skin:    Findings: No erythema or rash.  Neurological:     Mental Status: She is oriented to person, place, and time.     Motor: No abnormal muscle tone.     Coordination: Coordination normal.  Psychiatric:  Behavior: Behavior normal.      ED Treatments / Results  Labs (all labs ordered are listed, but only abnormal results are displayed) Labs Reviewed  CBC WITH DIFFERENTIAL/PLATELET  COMPREHENSIVE METABOLIC PANEL  TROPONIN I    EKG EKG Interpretation  Date/Time:  Sunday April 03 2018 17:15:21 EDT Ventricular Rate:  83 PR Interval:    QRS Duration: 132 QT Interval:  426 QTC Calculation: 501 R Axis:   153 Text Interpretation:  Sinus rhythm RBBB and LPFB Lateral infarct, acute Confirmed by Bethann Berkshire [id number]) on 04/03/2018 5:26:12 PM   Radiology Dg Chest Port 1 View  Result Date: 04/03/2018 CLINICAL DATA:  Chest pain EXAM: PORTABLE CHEST 1 VIEW COMPARISON:  04/01/2018 FINDINGS: Heart size is normal. Mediastinal shadows are normal. There is patchy infiltrate at the lung bases left more than right consistent with basilar pneumonia. No dense consolidation or lobar collapse. No visible effusion. No significant bone finding. IMPRESSION: Left lower lobe pneumonia. Possible mild involvement at the right base as well. Electronically Signed   By: Paulina Fusi M.D.   On: 04/03/2018 17:29    Procedures Procedures (including critical care time)  Medications Ordered in ED Medications - No data to display   Initial Impression / Assessment and Plan / ED Course  I have reviewed the triage vital signs and the nursing notes.  Pertinent labs & imaging  results that were available during my care of the patient were reviewed by me and considered in my medical decision making (see chart for details).      Patient with back pain and palpitations.  EKG shows ST depression inferiorly.  Chest x-ray shows questionable pneumonia.  Patient will be admitted for chest pain rule out  Final Clinical Impressions(s) / ED Diagnoses   Final diagnoses:  Palpitations    ED Discharge Orders    None       Bethann Berkshire, MD 04/03/18 2106

## 2018-04-03 NOTE — H&P (Addendum)
Date: 04/03/2018               Patient Name:  [name] MRN: [id number]  DOB: February 09, 1970 Age / Sex: 48 y.o., female   PCP: Burna Cash, MD         Medical Service: Internal Medicine Teaching Service         Attending Physician: Dr. Gust Rung, DO    First Contact: Dr. Hermine Messick Pager: [phone]  Second Contact: Dr. Reymundo Poll Pager: [phone]       After Hours (After 5p/  First Contact Pager: [phone]  weekends / holidays): Second Contact Pager: [phone]   Chief Complaint: Palpitations and back pain  History of Present Illness: [name] is a 48 year old female with hypertrophic obstructive cardiomyopathy, paroxysmal atrial fibrillation, hypertension, and type 2 diabetes who presents with potation's and back pain.  Ms. Trim reports 2 episodes of chest pain with associated palpitations and diaphoresis over the last 24 hours.  Her symptoms lasted for 5 minutes at a time.  The first episode occurred when she was sitting down and self resolved.  The second episode occurred when she was about to take a shower and resolved with ibuprofen.  She has had both palpitations and back pain in the past and states that back pain was relieved with pain medication.  She does report a nonproductive cough and dental pain about 1 week ago.  She states that she went to the urgent care where she was prescribed clindamycin.  She states that she only completed 3 of the recommended 10-day course.  She does report complete resolution of her cough and dental pain. She denies nausea, vomiting, abdominal pain, fevers, and urinary symptoms. She does report a 1 year history of increased bowel movements since having her gallbladder removed.  Denies any new changes in bowel movements.  She denies sick contacts and recent travel.  In the ED she was found to be hypertensive with SBP up to the 201.  Blood pressure improved to SBP of 150s. She had otherwise unremarkable vital signs.  Labs  were significant for unremarkable CBC and CMP.  Troponins negative x1.    Meds:  Current Meds  Medication Sig  . clindamycin (CLEOCIN) 300 MG capsule Take 1 capsule (300 mg total) by mouth 3 (three) times daily.  . empagliflozin (JARDIANCE) 10 MG TABS tablet Take 10 mg by mouth daily.  . metFORMIN (GLUCOPHAGE) 1000 MG tablet Take 1 tablet (1,000 mg total) by mouth 2 (two) times daily with a meal.  . metoprolol succinate (TOPROL-XL) 50 MG 24 hr tablet TAKE 1 TABLET BY MOUTH DAILY. TAKE WITH OR IMMEDIATELY FOLLOWING A MEAL.  . rivaroxaban (XARELTO) 20 MG TABS tablet Take 1 tablet (20 mg total) by mouth daily with supper.  . verapamil (CALAN-SR) 180 MG CR tablet Take 1 tablet (180 mg total) by mouth daily.     Allergies: Allergies as of 04/03/2018  . (No Known Allergies)   Past Medical History:  Diagnosis Date  . Bell's palsy 03/2017  . Gallstones   . Gestational diabetes mellitus (GDM) 2005; 2006  . Headache    "monthly" (03/12/2017)  . History of chest pain   . History of prolonged Q-T interval on ECG    a. 09/2012 adm: QTC 506.  Marland Kitchen Hypertension   . PAF (paroxysmal atrial fibrillation) (HCC) 2012   a. 07/2011 ED visit with AF-> converted with flecainide, never followed-up;  b. 05/2012 Recurrent Afib -echo: EF 65-60%, mildly  dil LA. Placed on Xarelto/daily Flecainide. c. 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  . Small bowel obstruction (HCC) 09/2010   resolved  . Type II diabetes mellitus (HCC)    a. 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 b. 05/2012 A1c 12.6.    Family History:  Family History  Problem Relation Age of Onset  . Other Father        killed in [place] civil war when he was in his 20's  . Asthma Mother        alive in her 35's  . Other Sister        A & W  . Other Sister        A & W  . Heart disease Neg Hx     Social History: She is originally from [place].  She lives in [place] with her 2 daughters. She works as a Advertising copywriter.   She is a never smoker.  She denies alcohol and illicit drug use.  Review of Systems: A complete ROS was negative except as per HPI.   Physical Exam: Blood pressure (!) 154/81, pulse 67, temperature 98.5 F (36.9 C), temperature source Oral, resp. rate 18, last menstrual period 10/01/2006, SpO2 99 %. General: Lying in bed in no acute distress. Cardiovascular: Normal rate, regular rhythm. Respiratory: Clear to auscultation bilaterally, normal oxygen saturations on room air, normal work of breathing. Abdominal: Normoactive bowel sounds, no tenderness to palpation of the abdomen. MSK: No chest wall tenderness, no lower extremity edema, warmth, or tenderness to palpation. Skin: Warm and dry Neuro: Alert and oriented. Psych: Normal behavior, mood, affect.  EKG: personally reviewed my interpretation is normal sinus rhythm, normal rate, ST depressions in inferior leads.  CXR: personally reviewed my interpretation is patchy infiltrate at the left lung base.  Assessment & Plan by Problem: Active Problems:   Chest pain, rule out acute myocardial infarction  [name] is a 48 year old female with T2DM, HTN, and HOCM who presents with 2 episodes of palpitations, back pain, and diaphoresis.  Differential diagnosis includes known paroxysmal atrial fibrillation, cardiac disease, or musculoskeletal.  Heart score is 5.  Will plan on admitting her for further cardiac work-up to rule out life-threatening disease such as acute coronary syndrome.  Atypical chest pain, palpitations: - Continuous cardiac monitoring - Repeat EKG in the morning - Trend troponins - Tylenol 650 mg every 6 hours PRN pain  Paroxysmal atrial fibrillation/hypertension: She is currently in normal sinus rhythm with normal heart rate.  Her blood pressure does remain elevated. Will continue to monitor for now on home meds as below. - Continue home metoprolol and verapamil - Continue Xarelto 20 mg daily  Patchy left lung infiltrate  on chest x-ray: Incidental finding concerning for a viral pneumonia.  She however has no respiratory symptoms including cough or congestion.  Nor has she had any fevers or chills.  Low suspicion for current active pulmonary infection.  RVP negative.  Plan on continuing to monitor for any new symptoms.  Type 2 diabetes: - SSI-moderate  FEN/GI: Heart healthy/carb modified CODE STATUS: Full DVT prophylaxis: Xarelto  Dispo: Admit patient to Observation with expected length of stay less than 2 midnights.  Signed: Synetta Shadow, MD 04/03/2018, 8:56 PM

## 2018-04-04 ENCOUNTER — Other Ambulatory Visit: Payer: Self-pay

## 2018-04-04 DIAGNOSIS — E119 Type 2 diabetes mellitus without complications: Secondary | ICD-10-CM | POA: Diagnosis not present

## 2018-04-04 DIAGNOSIS — R918 Other nonspecific abnormal finding of lung field: Secondary | ICD-10-CM

## 2018-04-04 DIAGNOSIS — M549 Dorsalgia, unspecified: Secondary | ICD-10-CM | POA: Diagnosis present

## 2018-04-04 DIAGNOSIS — I10 Essential (primary) hypertension: Secondary | ICD-10-CM

## 2018-04-04 DIAGNOSIS — I480 Paroxysmal atrial fibrillation: Secondary | ICD-10-CM

## 2018-04-04 DIAGNOSIS — G510 Bell's palsy: Secondary | ICD-10-CM | POA: Diagnosis not present

## 2018-04-04 DIAGNOSIS — Z79899 Other long term (current) drug therapy: Secondary | ICD-10-CM

## 2018-04-04 DIAGNOSIS — Z7901 Long term (current) use of anticoagulants: Secondary | ICD-10-CM

## 2018-04-04 LAB — RESPIRATORY PANEL BY PCR
Adenovirus: NOT DETECTED
Bordetella pertussis: NOT DETECTED
Chlamydophila pneumoniae: NOT DETECTED
Coronavirus 229E: NOT DETECTED
Coronavirus HKU1: NOT DETECTED
Coronavirus NL63: NOT DETECTED
Coronavirus OC43: NOT DETECTED
Influenza A: NOT DETECTED
Influenza B: NOT DETECTED
Metapneumovirus: NOT DETECTED
Mycoplasma pneumoniae: NOT DETECTED
Parainfluenza Virus 1: NOT DETECTED
Parainfluenza Virus 2: NOT DETECTED
Parainfluenza Virus 3: NOT DETECTED
Parainfluenza Virus 4: NOT DETECTED
RESPIRATORY SYNCYTIAL VIRUS-RVPPCR: NOT DETECTED
Rhinovirus / Enterovirus: NOT DETECTED

## 2018-04-04 LAB — HIV ANTIBODY (ROUTINE TESTING W REFLEX): HIV Screen 4th Generation wRfx: NONREACTIVE

## 2018-04-04 LAB — GLUCOSE, CAPILLARY
Glucose-Capillary: 199 mg/dL — ABNORMAL HIGH (ref 70–99)
Glucose-Capillary: 255 mg/dL — ABNORMAL HIGH (ref 70–99)
Glucose-Capillary: 157 mg/dL — ABNORMAL HIGH (ref 70–99)

## 2018-04-04 LAB — TROPONIN I
Troponin I: <0.03 ng/mL (ref <0.03)
Troponin I: <0.03 ng/mL (ref <0.03)

## 2018-04-04 MED ORDER — INSULIN ASPART 100 UNIT/ML ~~LOC~~ SOLN
0.0000 [IU] | Freq: Three times a day (TID) | SUBCUTANEOUS | Status: DC
  Administered 2018-04-04: 3 [IU] via SUBCUTANEOUS

## 2018-04-04 NOTE — Discharge Instructions (Signed)
[name],   It has been a pleasure working with you and we are glad you're feeling better. You were hospitalized for back pain. We think that this is due to some muscle pains from lifting heavy objects. We did an EKG, which looks at the electrical activity of your heart, and your troponins, which tell us if there has been damage to your heart. Your tropnins were negative but there were some abnormalities on your EKG. You should follow up with a cardiologist at some point to see if you need any more testing.   For your back pain,  -You can use heating pads of the area to relieve the pain. You can also use tylenol as needed.  -Please take 1 week off from work to see if this helps with the pain  Follow up with your primary care provider in 1 week, we have made an appointment for a telehealth visit for next week. If this time does not work for you please let us know.   If your symptoms worsen or you develop new symptoms, please seek medical help whether it is your primary care provider or emergency department.  If you have any questions about this hospitalization please call [phone].

## 2018-04-04 NOTE — Progress Notes (Signed)
Subjective: [name] reports that she is doing well today, no acute events overnight.  She reports that pain has resolved, she denies any back pain at this moment.  She did confirm that she was just having back pain, denied any chest pain.  She thinks the back pain may be from her job, she works as a Regulatory affairs officer heavy objects occasionally.  She denies any shortness of breath, coughing, chest pain, or palpitations today.  We discussed the results of the EKG and troponins.  We discussed the plan for today and she is in agreement.  Objective:  Vital signs in last 24 hours: Vitals:   04/03/18 2348 04/04/18 0000 04/04/18 0045 04/04/18 0109  BP: (!) 182/90 (!) 172/91 (!) 197/99 (!) 162/98  Pulse: 83 79 83   Resp: 15 19 14    Temp:   98.3 F (36.8 C)   TempSrc:   Oral   SpO2: 99% 98% 99%   Weight:   81 kg   Height:   5\' 6"  (1.676 m)     General: Well-appearing female, no acute distress, sitting comfortably in bed Cardiac: RRR, no M/R/G Pulmonary: CTA BL, no wheezing, rhonchi, or rails Back: Minimal TTP over left paraspinal area around T8 and right paraspinal area around T3,  Extremity:No LE edema, warm extremities Psychiatry: Normal mood and affect    Assessment/Plan:  Active Problems:   Chest pain, rule out acute myocardial infarction  This is a 48 year old female with a history of [name], paroxysmal atrial fibrillation, hypertension, and type 2 diabetes who presented with palpitations and back pain.  She was noted to be hypertensive with systolic blood pressures up to 200, CBC and CMP were unremarkable, troponins are negative x2.  Initial EKG showed some ST depressions in inferior leads, this improved on today's EKG.  Back pain: -Patient denied any actual chest pain, however given her heart score of 5, her history of diabetes and female sex there was a concern about a cardiac issue.  Initial EKG did show some ST depressions in the inferior leads, today's EKG showed dynamic  changes.  Troponins are negative x2.  She reported that it is only back pain, on exam she has TTP over the paraspinal muscles on the left T8 area and right T3 area.  Given her occupational history this appears to be more musculoskeletal in nature.  We would recommend cardiac evaluation in the future given the dynamic changes however this is not urgent.  She is stable for discharge today. -Tylenol every 6 hours as needed -Continue telemetry -Recommend heating pads and rest for 1 week discharge -Follow-up with telehealth in 1 week   Paroxysmal atrial fibrillation: She continues to be in normal sinus rhythm, no episodes of atrial fibrillation.  She is currently on her home metoprolol, verapamil and Xarelto. -Continue home medications on discharge  Possible resolving pneumonia: Chest x-ray showed a left lung infiltrate that was concerning for pneumonia.  She did report a cough last week and completed about 3 days of doxycycline.  She denies any respiratory symptoms at this time, denies any cough, congestion, fevers, chills.  RVP was negative.  Does not seem like she has an active pneumonia at this time, findings and x-ray could be consistent with a resolving pneumonia. -Repeat chest x-ray in about 5 weeks  Type 2 DM: -SSI-Mod  FEN: No fluids, replete lytes prn, HH diet  VTE ppx: Xeralto Code Status: FULL   Dispo: Anticipated discharge in approximately today.   [name]  Judie Petit, MD 04/04/2018, 6:40 AM Pager: [phone]

## 2018-04-04 NOTE — ED Notes (Signed)
ED TO INPATIENT HANDOFF REPORT  ED Nurse Name and Phone #: Geroge Basemandrian Lalena Salas RN [phone]  S Name/Age/Gender [name] 48 y.o. female Room/Bed: 038C/038C  Code Status   Code Status: Full Code  Home/SNF/Other Home Patient oriented to: self, place, time and situation Is this baseline? Yes   Triage Complete: Triage complete  Chief Complaint Back Pain   Triage Note Pt here for chest pain that radiates from left side of back to front. Pt states she has had body aches in "knees" and having sweating episodes, but denies fevers. Denies travel or sick contacts    Allergies No Known Allergies  Level of Care/Admitting Diagnosis ED Disposition    ED Disposition Condition Comment   Admit  [facility] [[id number]]  Level of Care: Telemetry Cardiac [103]  Diagnosis: Chest pain, rule out acute myocardial infarction [[id number]][[id number]]  Admitting Physician: Silvio PateHOFFMAN, ERIK C [[id number]]  Attending Physician: Mikey BussingHOFFMAN, ERIK C [[id number]]  PT Class (Do Not Modify): Observation [104]  PT Acc Code (Do Not Modify): Observation [10022]       B Medical/Surgery History Past Medical History:  Diagnosis Date  . Bell's palsy 03/2017  . Gallstones   . Gestational diabetes mellitus (GDM) 2005; 2006  . Headache    "monthly" (03/12/2017)  . History of chest pain   . History of prolonged Q-T interval on ECG    a. 09/2012 adm: QTC 506.  Marland Kitchen. Hypertension   . PAF (paroxysmal atrial fibrillation) (HCC) 2012   a. 07/2011 ED visit with AF-> converted with flecainide, never followed-up;  b. 05/2012 Recurrent Afib -echo: EF 65-60%, mildly dil LA. Placed on Xarelto/daily Flecainide. c. 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  . Small bowel obstruction (HCC) 09/2010   resolved  . Type II diabetes mellitus (HCC)    a. 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 b. 05/2012 A1c 12.6.   Past Surgical History:  Procedure Laterality Date  . CESAREAN SECTION  2005; 2006  .  CHOLECYSTECTOMY N/A 03/25/2017   Procedure: LAPAROSCOPIC CHOLECYSTECTOMY;  Surgeon: Kinsinger, De BlanchLuke Aaron, MD;  [facility];  Service: General;  Laterality: N/A;  . HERNIA REPAIR    . VENTRAL HERNIA REPAIR  02/05/2005   with mesh     A IV Location/Drains/Wounds Patient Lines/Drains/Airways Status   Active Line/Drains/Airways    Name:   Placement date:   Placement time:   Site:   Days:   Peripheral IV 04/01/17 Right Antecubital   04/01/17    0331    Antecubital   368   Peripheral IV 04/03/18 Left Antecubital   04/03/18    1558    Antecubital   1   Incision (Closed) 03/25/17 Abdomen   03/25/17    1034     375          Intake/Output Last 24 hours No intake or output data in the 24 hours ending 04/04/18 0011  Labs/Imaging Results for orders placed or performed during the hospital encounter of 04/03/18 (from the past 48 hour(s))  CBC with Differential/Platelet     Status: Abnormal   Collection Time: 04/03/18  7:39 PM  Result Value Ref Range   WBC 10.4 4.0 - 10.5 K/uL   RBC 5.07 3.87 - 5.11 MIL/uL   Hemoglobin 12.9 12.0 - 15.0 g/dL   HCT 41.340.7 24.436.0 - 01.046.0 %   MCV 80.3 80.0 - 100.0 fL   MCH 25.4 (L) 26.0 - 34.0 pg  MCHC 31.7 30.0 - 36.0 g/dL   RDW 52.4 81.8 - 59.0 %   Platelets 250 150 - 400 K/uL   nRBC 0.0 0.0 - 0.2 %   Neutrophils Relative % 52 %   Neutro Abs 5.5 1.7 - 7.7 K/uL   Lymphocytes Relative 37 %   Lymphs Abs 3.8 0.7 - 4.0 K/uL   Monocytes Relative 8 %   Monocytes Absolute 0.8 0.1 - 1.0 K/uL   Eosinophils Relative 2 %   Eosinophils Absolute 0.2 0.0 - 0.5 K/uL   Basophils Relative 1 %   Basophils Absolute 0.1 0.0 - 0.1 K/uL   Immature Granulocytes 0 %   Abs Immature Granulocytes 0.03 0.00 - 0.07 K/uL    Comment: Performed at [facility], [address] [place], [place] [address]  Comprehensive metabolic panel     Status: Abnormal   Collection Time: 04/03/18  7:39 PM  Result Value Ref Range   Sodium 137 135 - 145 mmol/L    Potassium 4.2 3.5 - 5.1 mmol/L   Chloride 101 98 - 111 mmol/L   CO2 25 22 - 32 mmol/L   Glucose, Bld 158 (H) 70 - 99 mg/dL   BUN 12 6 - 20 mg/dL   Creatinine, Ser 1.62 0.44 - 1.00 mg/dL   Calcium 9.6 8.9 - 44.6 mg/dL   Total Protein 7.9 6.5 - 8.1 g/dL   Albumin 3.8 3.5 - 5.0 g/dL   AST 28 15 - 41 U/L   ALT 26 0 - 44 U/L   Alkaline Phosphatase 102 38 - 126 U/L   Total Bilirubin 0.5 0.3 - 1.2 mg/dL   GFR calc non Af Amer >60 >60 mL/min   GFR calc Af Amer >60 >60 mL/min   Anion gap 11 5 - 15    Comment: Performed at [facility], [address], [place], [place] [address]  Troponin I -     Status: None   Collection Time: 04/03/18  7:39 PM  Result Value Ref Range   Troponin I <0.03 <0.03 ng/mL    Comment: Performed at [facility], [address] [place], [place] [address]  Lipid panel     Status: Abnormal   Collection Time: 04/03/18  7:39 PM  Result Value Ref Range   Cholesterol 135 0 - 200 mg/dL   Triglycerides 518 (H) <150 mg/dL   HDL 56 >33 mg/dL   Total CHOL/HDL Ratio 2.4 RATIO   VLDL 53 (H) 0 - 40 mg/dL   LDL Cholesterol 26 0 - 99 mg/dL    Comment:        Total Cholesterol/HDL:CHD Risk Coronary Heart Disease Risk Table                     Men   Women  1/2 Average Risk   3.4   3.3  Average Risk       5.0   4.4  2 X Average Risk   9.6   7.1  3 X Average Risk  23.4   11.0        Use the calculated Patient Ratio above and the CHD Risk Table to determine the patient's CHD Risk.        ATP III CLASSIFICATION (LDL):  <100     mg/dL   Optimal  582-518  mg/dL   Near or Above  Optimal  130-159  mg/dL   Borderline  951-884  mg/dL   High  >166     mg/dL   Very High Performed at [facility], [address], [place], [place] [address]    Dg Chest Port 1 View  Result Date: 04/03/2018 CLINICAL DATA:  Chest pain EXAM: PORTABLE CHEST 1 VIEW COMPARISON:  04/01/2018 FINDINGS: Heart size is normal. Mediastinal shadows are normal. There is  patchy infiltrate at the lung bases left more than right consistent with basilar pneumonia. No dense consolidation or lobar collapse. No visible effusion. No significant bone finding. IMPRESSION: Left lower lobe pneumonia. Possible mild involvement at the right base as well. Electronically Signed   By: Paulina Fusi M.D.   On: 04/03/2018 17:29    Pending Labs Unresulted Labs (From admission, onward)    Start     Ordered   04/04/18 0200  Troponin I - Now Then Q6H  Now then every 6 hours,   R     04/03/18 2109   04/03/18 2148  HIV antibody (Routine Testing)  Once,   R     04/03/18 2148   04/03/18 2112  Respiratory Panel by PCR  (Respiratory virus panel with precautions)  Once,   R     04/03/18 2112          Vitals/Pain Today's Vitals   04/03/18 2100 04/03/18 2115 04/03/18 2348 04/04/18 0000  BP:   (!) 182/90 (!) 172/91  Pulse: 76  83 79  Resp: 19  15 19   Temp:      TempSrc:      SpO2: 99%  99% 98%  PainSc:  0-No pain      Isolation Precautions Droplet precaution  Medications Medications  sodium chloride flush (NS) 0.9 % injection 3 mL (has no administration in time range)  sodium chloride flush (NS) 0.9 % injection 3 mL (has no administration in time range)  sodium chloride flush (NS) 0.9 % injection 3 mL (has no administration in time range)  acetaminophen (TYLENOL) tablet 650 mg (has no administration in time range)    Or  acetaminophen (TYLENOL) suppository 650 mg (has no administration in time range)  polyethylene glycol (MIRALAX / GLYCOLAX) packet 17 g (has no administration in time range)  ondansetron (ZOFRAN) tablet 4 mg (has no administration in time range)    Or  ondansetron (ZOFRAN) injection 4 mg (has no administration in time range)  rivaroxaban (XARELTO) tablet 20 mg (20 mg Oral Given 04/03/18 2209)  verapamil (CALAN-SR) CR tablet 180 mg (180 mg Oral Given 04/03/18 2209)  metoprolol succinate (TOPROL-XL) 24 hr tablet 50 mg (has no administration in time range)     Mobility walks Low fall risk   Focused Assessments Cardiac Assessment Handoff:  Cardiac Rhythm: Normal sinus rhythm Lab Results  Component Value Date   CKTOTAL 91 10/20/2010   CKMB 1.9 10/20/2010   TROPONINI <0.03 04/03/2018   No results found for: DDIMER Does the Patient currently have chest pain? Yes     R Recommendations: See Admitting Provider Note  Report given to:   Additional Notes:

## 2018-04-04 NOTE — Discharge Summary (Signed)
Name: [name] MRN: [id number] DOB: [birth year] 48 y.o. PCP: Burna Cash, MD  Date of Admission: 04/03/2018  3:27 PM Date of Discharge: 04/04/2018 Attending Physician: Gust Rung, DO  Discharge Diagnosis: 1. Back pain 2. Paroxysmal atrial fibrillation 3. Resoling pneumonia 4. Type 2 DM  Discharge Medications: Allergies as of 04/04/2018   No Known Allergies     Medication List    STOP taking these medications   artificial tears Oint ophthalmic ointment Commonly known as:  LACRILUBE   clindamycin 300 MG capsule Commonly known as:  Cleocin   hydroxypropyl methylcellulose / hypromellose 2.5 % ophthalmic solution Commonly known as:  ISOPTO TEARS / GONIOVISC   Propylene Glycol 0.6 % Soln Commonly known as:  Systane Complete   traMADol 50 MG tablet Commonly known as:  ULTRAM     TAKE these medications   empagliflozin 10 MG Tabs tablet Commonly known as:  Jardiance Take 10 mg by mouth daily.   metFORMIN 1000 MG tablet Commonly known as:  GLUCOPHAGE Take 1 tablet (1,000 mg total) by mouth 2 (two) times daily with a meal.   metoprolol succinate 50 MG 24 hr tablet Commonly known as:  TOPROL-XL TAKE 1 TABLET BY MOUTH DAILY. TAKE WITH OR IMMEDIATELY FOLLOWING A MEAL.   rivaroxaban 20 MG Tabs tablet Commonly known as:  XARELTO Take 1 tablet (20 mg total) by mouth daily with supper.   verapamil 180 MG CR tablet Commonly known as:  CALAN-SR Take 1 tablet (180 mg total) by mouth daily.       Disposition and follow-up:   [name] was discharged from [facility] in Good condition.  At the hospital follow up visit please address:  1.  Back pain: Please assess her symptoms. She did have some dynamic changes on EKG, troponin's were negative, and she denied any chest pain. She may benefit from a cardiology referral for possible stress test.   ?Resolving pneumonia: CXR showed left lung infiltrate, did not have any pulmonary  symptoms. Please recheck CXR in 5 weeks.   2.  Labs / imaging needed at time of follow-up: CBC, BMP, CXR in 5 weeks  3.  Pending labs/ test needing follow-up: None  Follow-up Appointments: Follow-up Information    Burna Cash, MD Follow up.   Specialty:  Internal Medicine Why:  Please expect a call on April 6th, Monday, at 10:15 for a telehealth visit to make sure that your symptoms have improved.  Contact information: [address] [place] [address] [phone]           Hospital Course by problem list: 1. Back pain: This is a 48 year old female with a history of Holcomb, paroxysmal atrial fibrillation, hypertension, and type 2 diabetes who presented with palpitations and back pain.  She was noted to be hypertensive with systolic blood pressures up to 200, CBC and CMP were unremarkable, troponins are negative x2.  Initial EKG showed some ST depressions in inferior leads, this improved on repeat EKG. She reported that she had pain over her back near the left T8 and right T3 areas, she had TTP over those areas. Given her occupational history this appeared to be musculoskeletal in nature. She was advised to follow up with a cardiologist given her dynamic changes on EKG. Advised to take 1 week off from work and follow up with the clinic in 1 week. Advised to follow up with cardiology due to her dynamic changes on EKG.   2. Paroxysmal atrial  fibrillation: She remained in NSR while she was in the hospital. Continued on her home metoprolol, verapamil, and Xeralto. No changes were made to home medications.   3. Resolving pneumonia: Patient was having a cough 1 week prior to admission, she took 3 days of antibiotics. On admission she denied any cough, congestion, fevers, chills, or other upper respiratory symptoms. RVP was negative. CXR showed a left lung infiltrate concerning for pneumonia. This may have been a resolving pneumonia and since she was not having any symptoms no further  antibiotics were started.   4. Type 2 DM: She was started on SSI-Mod while admitted. She was resumed on home medications on discharge.   Discharge Vitals:   BP (!) 163/95 (BP Location: Right Arm)   Pulse 76   Temp 98 F (36.7 C) (Oral)   Resp 17   Ht  (1.676 m)   Wt 81 kg   LMP 10/01/2006   SpO2 98%   BMI 28.83 kg/m   Pertinent Labs, Studies, and Procedures:  CBC Latest Ref Rng & Units 04/03/2018 04/02/2017 03/31/2017  WBC 4.0 - 10.5 K/uL 10.4 10.9(H) 15.1(H)  Hemoglobin 12.0 - 15.0 g/dL 40.9 11.7(L) 13.0  Hematocrit 36.0 - 46.0 % 40.7 36.5 39.2  Platelets 150 - 400 K/uL 250 231 313   BMP Latest Ref Rng & Units 04/03/2018 01/14/2018 04/02/2017  Glucose 70 - 99 mg/dL 811(B) 147(W) 295(A)  BUN 6 - 20 mg/dL Creatinine 0.44 - 1.00 mg/dL 2.13 0.86 5.78(I)  BUN/Creat Ratio 9 - 23 - 9 -  Sodium 135 - 145 mmol/L 137 137 136  Potassium 3.5 - 5.1 mmol/L 4.2 4.3 4.0  Chloride 98 - 111 mmol/L 101 97 98(L)  CO2 22 - 32 mmol/L Calcium 8.9 - 10.3 mg/dL 9.6 9.9 9.1     Discharge Instructions: Discharge Instructions    Call MD for:  difficulty breathing, headache or visual disturbances   Complete by:  As directed    Call MD for:  extreme fatigue   Complete by:  As directed    Call MD for:  persistant dizziness or light-headedness   Complete by:  As directed    Call MD for:  persistant nausea and vomiting   Complete by:  As directed    Call MD for:  severe uncontrolled pain   Complete by:  As directed    Call MD for:  temperature >100.4   Complete by:  As directed    Diet - low sodium heart healthy   Complete by:  As directed    Discharge instructions   Complete by:  As directed    [name],   It has been a pleasure working with you and we are glad you're feeling better. You were hospitalized for back pain. We think that this is due to some muscle pains from lifting heavy objects. We did an EKG, which looks at the electrical activity of your heart, and  your troponins, which tell us if there has been damage to your heart. Your tropnins were negative but there were some abnormalities on your EKG. You should follow up with a cardiologist at some point to see if you need any more testing.   For your back pain,  -You can use heating pads of the area to relieve the pain. You can also use tylenol as needed.  -Please take 1 week off from work to see if this helps with the pain  Follow  up with your primary care provider in 1 week, we have made an appointment for a telehealth visit for next week. If this time does not work for you please let us know.   If your symptoms worsen or you develop new symptoms, please seek medical help whether it is your primary care provider or emergency department.  If you have any questions about this hospitalization please call [phone].   Increase activity slowly   Complete by:  As directed       Signed: Claudean Severance, MD 04/05/2018, 12:58 PM   Pager: [phone]

## 2018-04-11 ENCOUNTER — Ambulatory Visit (INDEPENDENT_AMBULATORY_CARE_PROVIDER_SITE_OTHER): Payer: BC Managed Care – PPO | Admitting: Internal Medicine

## 2018-04-11 ENCOUNTER — Other Ambulatory Visit: Payer: Self-pay

## 2018-04-11 DIAGNOSIS — I10 Essential (primary) hypertension: Secondary | ICD-10-CM | POA: Diagnosis not present

## 2018-04-11 DIAGNOSIS — I480 Paroxysmal atrial fibrillation: Secondary | ICD-10-CM | POA: Diagnosis not present

## 2018-04-11 DIAGNOSIS — E119 Type 2 diabetes mellitus without complications: Secondary | ICD-10-CM | POA: Diagnosis not present

## 2018-04-11 DIAGNOSIS — M549 Dorsalgia, unspecified: Secondary | ICD-10-CM

## 2018-04-11 NOTE — Assessment & Plan Note (Signed)
HPI: Currently on Metofrmin and Empagliflozin.   A/P: Currently has all these medication at home. No refills needed.

## 2018-04-11 NOTE — Assessment & Plan Note (Signed)
>>  ASSESSMENT AND PLAN FOR ESSENTIAL HYPERTENSION WRITTEN ON 04/11/2018 10:08 AM BY HELBERG, JUSTIN, MD  HPI: Patient recently hospitalized for MSK back pain. SBP > 200 on admission. Remained elevated throughout hospitalization. She has not checked it since she left the hospital and does not have a home cuff. Review of records indicates that she has periods of normotension and hypertension.   A/P: Will not make any changes over the phone. Will continue metoprolol  and verapamil . Asked her to start checking her BP when she goes to the grocery store and record these numbers.

## 2018-04-11 NOTE — Progress Notes (Signed)
Medicine attending: Medical history, presenting problems,  and medications, reviewed with resident physician Dr Justin Helberg on the day of the patient telephone consultation and I concur with his evaluation and management plan. 

## 2018-04-11 NOTE — Progress Notes (Signed)
   CC: F/u MSK back pain, HTN, DM, and PAF  This is a telephone encounter between [name] and [name] on 04/11/2018 for F/u MSK back pain, HTN, DM, and PAF. The visit was conducted with the patient located at home and [facility]. The patient's identity was confirmed using their DOB and current address. The patient has consented to being evaluated through a telephone encounter and understands the associated risks (an examination cannot be done and the patient may need to come in for an appointment) / benefits (allows the patient to remain at home, decreasing exposure to coronavirus). I personally spent 10 minutes on medical discussion.   HPI:  [name] is a 48 y.o. with PMH as below.   Please see A&P for assessment of the patient's acute and chronic medical conditions.   Past Medical History:  Diagnosis Date  . Bell's palsy 03/2017  . Gallstones   . Gestational diabetes mellitus (GDM) 2005; 2006  . Headache    "monthly" (03/12/2017)  . History of chest pain   . History of prolonged Q-T interval on ECG    a. 09/2012 adm: QTC 506.  Marland Kitchen Hypertension   . PAF (paroxysmal atrial fibrillation) (HCC) 2012   a. 07/2011 ED visit with AF-> converted with flecainide, never followed-up;  b. 05/2012 Recurrent Afib -echo: EF 65-60%, mildly dil LA. Placed on Xarelto/daily Flecainide. c. 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  . Small bowel obstruction (HCC) 09/2010   resolved  . Type II diabetes mellitus (HCC)    a. 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 b. 05/2012 A1c 12.6.   Review of Systems:  Performed and all others negative.  Assessment & Plan:   See Encounters Tab for problem based charting.  Patient discussed with Dr. Cyndie Chime

## 2018-04-11 NOTE — Assessment & Plan Note (Signed)
HPI: Patient recently admitted for MSK back pain. 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 Since discharge she took some NSAIDs and her pain has resolved. She has not had any recurrence.   A/P: Resolved. No further management.

## 2018-04-11 NOTE — Assessment & Plan Note (Signed)
HPI: Patient with known A-fib. Currently on Metoprolol, Verapamil, and Xarelto. She has all her medications.   A/P: Continue Metoprolol, Verapamil, and Xarelto

## 2018-04-11 NOTE — Assessment & Plan Note (Signed)
HPI: Patient recently hospitalized for MSK back pain. SBP > 200 on admission. Remained elevated throughout hospitalization. She has not checked it since she left the hospital and does not have a home cuff. Review of records indicates that she has periods of normotension and hypertension.   A/P: Will not make any changes over the phone. Will continue metoprolol and verapamil. Asked her to start checking her BP when she goes to the grocery store and record these numbers.

## 2018-04-27 ENCOUNTER — Other Ambulatory Visit: Payer: Self-pay | Admitting: Pharmacist

## 2018-04-27 DIAGNOSIS — E119 Type 2 diabetes mellitus without complications: Secondary | ICD-10-CM

## 2018-04-27 DIAGNOSIS — I10 Essential (primary) hypertension: Secondary | ICD-10-CM

## 2018-04-27 DIAGNOSIS — I480 Paroxysmal atrial fibrillation: Secondary | ICD-10-CM

## 2018-04-27 MED ORDER — EMPAGLIFLOZIN-METFORMIN HCL 5-1000 MG PO TABS: 1.0000 | ORAL_TABLET | Freq: Two times a day (BID) | ORAL | 3 refills | Status: DC

## 2018-04-27 MED ORDER — VERAPAMIL HCL ER 180 MG PO TBCR: 180.0000 mg | EXTENDED_RELEASE_TABLET | Freq: Every day | ORAL | 3 refills | Status: DC

## 2018-04-27 MED ORDER — RIVAROXABAN 20 MG PO TABS: 20.0000 mg | ORAL_TABLET | Freq: Every day | ORAL | 3 refills | Status: DC

## 2018-04-27 MED ORDER — METOPROLOL SUCCINATE ER 50 MG PO TB24: ORAL_TABLET | ORAL | 3 refills | Status: DC

## 2018-04-27 MED FILL — VERAPAMIL HCL ER 180 MG TBC: 180 | 90 days supply | Qty: 90 | Fill #0

## 2018-04-27 MED FILL — METOPROLOL SUCCINATE ER 50: 50 | 30 days supply | Qty: 30 | Fill #0

## 2018-04-27 MED FILL — XARELTO 20 MG TABLET: 20 | 30 days supply | Qty: 30 | Fill #0

## 2018-04-27 MED FILL — SYNJARDY 5-1,000 MG TABLET: 5-1000 | 30 days supply | Qty: 60 | Fill #0

## 2018-04-27 NOTE — Progress Notes (Signed)
Called patient to review medications (hospitalized in March a. Fib, pneumonia, diabetes). Patient was able to confirm medications but unsure of specific doses. I was unable to reach pharmacy to confirm history, but prescriptions appear expired in Epic. Patient requested refill on all meds. Offered assistance with cost but patient states they are affordable at this time. Sent in refills on all medications, and switched metformin and Jardiance to combo pill Synjardy to support adherence, notified patient and advised her to contact clinic if any medication related concerns surface.

## 2018-05-11 MED FILL — XARELTO 20 MG TABLET: 20 | 30 days supply | Qty: 30 | Fill #0

## 2018-05-11 MED FILL — SYNJARDY 5-1,000 MG TABLET: 5-1000 | 30 days supply | Qty: 60 | Fill #0

## 2018-05-11 MED FILL — VERAPAMIL HCL ER 180 MG TBC: 180 | 90 days supply | Qty: 90 | Fill #0

## 2018-05-11 MED FILL — METOPROLOL SUCCINATE ER 50: 50 | 30 days supply | Qty: 30 | Fill #0

## 2018-06-13 ENCOUNTER — Emergency Department (HOSPITAL_COMMUNITY): Payer: BC Managed Care – PPO

## 2018-06-13 ENCOUNTER — Encounter (HOSPITAL_COMMUNITY): Payer: Self-pay | Admitting: Emergency Medicine

## 2018-06-13 ENCOUNTER — Other Ambulatory Visit: Payer: Self-pay

## 2018-06-13 ENCOUNTER — Inpatient Hospital Stay (HOSPITAL_COMMUNITY)
Admission: EM | Admit: 2018-06-13 | Discharge: 2018-06-14 | DRG: 389 | Disposition: A | Discharge status: Home / Self Care | Payer: BC Managed Care – PPO | Attending: Internal Medicine | Admitting: Internal Medicine

## 2018-06-13 DIAGNOSIS — R1084 Generalized abdominal pain: Secondary | ICD-10-CM

## 2018-06-13 DIAGNOSIS — Z1159 Encounter for screening for other viral diseases: Secondary | ICD-10-CM

## 2018-06-13 DIAGNOSIS — E2609 Other primary hyperaldosteronism: Secondary | ICD-10-CM | POA: Diagnosis present

## 2018-06-13 DIAGNOSIS — K439 Ventral hernia without obstruction or gangrene: Secondary | ICD-10-CM | POA: Diagnosis present

## 2018-06-13 DIAGNOSIS — I421 Obstructive hypertrophic cardiomyopathy: Secondary | ICD-10-CM | POA: Diagnosis present

## 2018-06-13 DIAGNOSIS — Z8719 Personal history of other diseases of the digestive system: Secondary | ICD-10-CM

## 2018-06-13 DIAGNOSIS — Z9889 Other specified postprocedural states: Secondary | ICD-10-CM

## 2018-06-13 DIAGNOSIS — I10 Essential (primary) hypertension: Secondary | ICD-10-CM | POA: Diagnosis present

## 2018-06-13 DIAGNOSIS — I4891 Unspecified atrial fibrillation: Secondary | ICD-10-CM | POA: Diagnosis present

## 2018-06-13 DIAGNOSIS — I480 Paroxysmal atrial fibrillation: Secondary | ICD-10-CM | POA: Diagnosis present

## 2018-06-13 DIAGNOSIS — E119 Type 2 diabetes mellitus without complications: Secondary | ICD-10-CM | POA: Diagnosis present

## 2018-06-13 DIAGNOSIS — Z9049 Acquired absence of other specified parts of digestive tract: Secondary | ICD-10-CM | POA: Diagnosis not present

## 2018-06-13 DIAGNOSIS — K56609 Unspecified intestinal obstruction, unspecified as to partial versus complete obstruction: Secondary | ICD-10-CM | POA: Diagnosis not present

## 2018-06-13 DIAGNOSIS — E1169 Type 2 diabetes mellitus with other specified complication: Secondary | ICD-10-CM

## 2018-06-13 DIAGNOSIS — Z7901 Long term (current) use of anticoagulants: Secondary | ICD-10-CM | POA: Diagnosis not present

## 2018-06-13 DIAGNOSIS — K565 Intestinal adhesions [bands], unspecified as to partial versus complete obstruction: Principal | ICD-10-CM | POA: Diagnosis present

## 2018-06-13 DIAGNOSIS — Z7984 Long term (current) use of oral hypoglycemic drugs: Secondary | ICD-10-CM

## 2018-06-13 DIAGNOSIS — Z79899 Other long term (current) drug therapy: Secondary | ICD-10-CM

## 2018-06-13 DIAGNOSIS — R112 Nausea with vomiting, unspecified: Secondary | ICD-10-CM

## 2018-06-13 DIAGNOSIS — Z955 Presence of coronary angioplasty implant and graft: Secondary | ICD-10-CM | POA: Diagnosis not present

## 2018-06-13 DIAGNOSIS — K760 Fatty (change of) liver, not elsewhere classified: Secondary | ICD-10-CM | POA: Diagnosis present

## 2018-06-13 DIAGNOSIS — R590 Localized enlarged lymph nodes: Secondary | ICD-10-CM | POA: Diagnosis not present

## 2018-06-13 LAB — URINALYSIS, ROUTINE W REFLEX MICROSCOPIC
Bacteria, UA: NONE SEEN
Bilirubin Urine: NEGATIVE
Glucose, UA: >=500 mg/dL — AB
Hgb urine dipstick: NEGATIVE
Ketones, ur: NEGATIVE mg/dL
Nitrite: NEGATIVE
Protein, ur: NEGATIVE mg/dL
Specific Gravity, Urine: 1.025 (ref 1.005–1.030)
pH: 5 (ref 5.0–8.0)

## 2018-06-13 LAB — COMPREHENSIVE METABOLIC PANEL
ALT: 44 U/L (ref 0–44)
AST: 33 U/L (ref 15–41)
Albumin: 4 g/dL (ref 3.5–5.0)
Alkaline Phosphatase: 102 U/L (ref 38–126)
Anion gap: 11 (ref 5–15)
BUN: 14 mg/dL (ref 6–20)
CO2: 27 mmol/L (ref 22–32)
Calcium: 9.4 mg/dL (ref 8.9–10.3)
Chloride: 98 mmol/L (ref 98–111)
Creatinine, Ser: 0.98 mg/dL (ref 0.44–1.00)
GFR calc Af Amer: >60 mL/min (ref >60)
GFR calc non Af Amer: >60 mL/min (ref >60)
Glucose, Bld: 335 mg/dL — ABNORMAL HIGH (ref 70–99)
Potassium: 4.4 mmol/L (ref 3.5–5.1)
Sodium: 136 mmol/L (ref 135–145)
Total Bilirubin: 0.6 mg/dL (ref 0.3–1.2)
Total Protein: 8.2 g/dL — ABNORMAL HIGH (ref 6.5–8.1)

## 2018-06-13 LAB — CBC
HCT: 42.1 % (ref 36.0–46.0)
Hemoglobin: 13.1 g/dL (ref 12.0–15.0)
MCH: 25.2 pg — ABNORMAL LOW (ref 26.0–34.0)
MCHC: 31.1 g/dL (ref 30.0–36.0)
MCV: 81.1 fL (ref 80.0–100.0)
Platelets: 294 10*3/uL (ref 150–400)
RBC: 5.19 MIL/uL — ABNORMAL HIGH (ref 3.87–5.11)
RDW: 14 % (ref 11.5–15.5)
WBC: 12.7 10*3/uL — ABNORMAL HIGH (ref 4.0–10.5)
nRBC: 0 % (ref 0.0–0.2)

## 2018-06-13 LAB — GLUCOSE, CAPILLARY
Glucose-Capillary: 125 mg/dL — ABNORMAL HIGH (ref 70–99)
Glucose-Capillary: 125 mg/dL — ABNORMAL HIGH (ref 70–99)

## 2018-06-13 LAB — CBG MONITORING, ED
Glucose-Capillary: 347 mg/dL — ABNORMAL HIGH (ref 70–99)
Glucose-Capillary: 173 mg/dL — ABNORMAL HIGH (ref 70–99)
Glucose-Capillary: 129 mg/dL — ABNORMAL HIGH (ref 70–99)

## 2018-06-13 LAB — I-STAT BETA HCG BLOOD, ED (MC, WL, AP ONLY): I-stat hCG, quantitative: <5 m[IU]/mL (ref <5)

## 2018-06-13 LAB — SARS CORONAVIRUS 2: SARS Coronavirus 2: NOT DETECTED

## 2018-06-13 LAB — HEMOGLOBIN A1C
Hgb A1c MFr Bld: 11.7 % — ABNORMAL HIGH (ref 4.8–5.6)
Mean Plasma Glucose: 289.09 mg/dL

## 2018-06-13 LAB — LIPASE, BLOOD: Lipase: 31 U/L (ref 11–51)

## 2018-06-13 MED ORDER — ACETAMINOPHEN 325 MG PO TABS: 650.0000 mg | ORAL_TABLET | Freq: Four times a day (QID) | ORAL | Status: DC | PRN

## 2018-06-13 MED ORDER — ONDANSETRON HCL 4 MG/2ML IJ SOLN: 4.0000 mg | Freq: Once | INTRAMUSCULAR | Status: DC

## 2018-06-13 MED ORDER — SODIUM CHLORIDE 0.9% FLUSH: 3.0000 mL | Freq: Once | INTRAVENOUS | Status: DC

## 2018-06-13 MED ORDER — POTASSIUM CHLORIDE 2 MEQ/ML IV SOLN
INTRAVENOUS | Status: DC
  Administered 2018-06-13 – 2018-06-14 (×2): via INTRAVENOUS
  Filled 2018-06-13 (×3): qty 1000

## 2018-06-13 MED ORDER — HYDROMORPHONE HCL 1 MG/ML IJ SOLN
0.5000 mg | INTRAMUSCULAR | Status: DC | PRN
  Administered 2018-06-13: 23:00:00 1 mg via INTRAVENOUS
  Filled 2018-06-13: qty 1

## 2018-06-13 MED ORDER — INSULIN ASPART 100 UNIT/ML ~~LOC~~ SOLN
0.0000 [IU] | SUBCUTANEOUS | Status: DC
  Administered 2018-06-13 – 2018-06-14 (×3): 1 [IU] via SUBCUTANEOUS
  Administered 2018-06-14: 09:00:00 2 [IU] via SUBCUTANEOUS

## 2018-06-13 MED ORDER — PROMETHAZINE HCL 25 MG PO TABS: 12.5000 mg | ORAL_TABLET | Freq: Four times a day (QID) | ORAL | Status: DC | PRN

## 2018-06-13 MED ORDER — SODIUM CHLORIDE 0.9 % IV BOLUS
1000.0000 mL | Freq: Once | INTRAVENOUS | Status: AC
  Administered 2018-06-13: 1000 mL via INTRAVENOUS

## 2018-06-13 MED ORDER — ENOXAPARIN SODIUM 40 MG/0.4ML ~~LOC~~ SOLN
40.0000 mg | SUBCUTANEOUS | Status: DC
  Administered 2018-06-13: 16:00:00 40 mg via SUBCUTANEOUS
  Filled 2018-06-13: qty 0.4

## 2018-06-13 MED ORDER — ACETAMINOPHEN 650 MG RE SUPP: 650.0000 mg | Freq: Four times a day (QID) | RECTAL | Status: DC | PRN

## 2018-06-13 MED ORDER — INSULIN ASPART 100 UNIT/ML ~~LOC~~ SOLN: 0.0000 [IU] | SUBCUTANEOUS | Status: DC

## 2018-06-13 MED ORDER — MORPHINE SULFATE (PF) 4 MG/ML IV SOLN
4.0000 mg | Freq: Once | INTRAVENOUS | Status: AC
  Administered 2018-06-13: 06:00:00 4 mg via INTRAVENOUS
  Filled 2018-06-13: qty 1

## 2018-06-13 MED ORDER — IOHEXOL 300 MG/ML  SOLN
100.0000 mL | Freq: Once | INTRAMUSCULAR | Status: AC | PRN
  Administered 2018-06-13: 100 mL via INTRAVENOUS

## 2018-06-13 NOTE — ED Notes (Signed)
Large emesis in the waiting area

## 2018-06-13 NOTE — ED Provider Notes (Signed)
[facility] Provider Note   CSN: [id number] Arrival date & time: 06/13/18  0112  History   Chief Complaint Chief Complaint  Patient presents with   Abdominal Pain    HPI [name] is a 48 y.o. female with past medical history significant for all stents, diabetes, hypertension, paroxysmal A. fib on Xarelto and flecainide, SBO who presents for evaluation of abdominal pain.  Patient states she has had abdominal pain which started 6 PM yesterday, 12 hours PTA.  Pain described as generalized in nature.  Patient states she had one episode of nonbloody, nonbilious emesis while in the breathing room.  She rates her current pain an 8/10.  Described as an aching sensation.  Patient states while pain is generalized in nature she also has pain to her right lower quadrant as well as burning with urination.  Denies pelvic pain, fever, chills, chest pain, shortness of breath, diarrhea, constipation, concerns for STDs.  Symptoms have been constant since onset.  Denies prior history of gastroparesis.  History obtained from patient.  No interpreter was used.     HPI  Past Medical History:  Diagnosis Date   Bell's palsy 03/2017   Gallstones    Gestational diabetes mellitus (GDM) 2005; 2006   Headache    "monthly" (03/12/2017)   History of chest pain    History of prolonged Q-T interval on ECG    a. 09/2012 adm: QTC 506.   Hypertension    PAF (paroxysmal atrial fibrillation) (HCC) 2012   a. 07/2011 ED visit with AF-> converted with flecainide, never followed-up;  b. 05/2012 Recurrent Afib -echo: EF 65-60%, mildly dil LA. Placed on Xarelto/daily Flecainide. c. 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   Small bowel obstruction (HCC) 09/2010   resolved   Type II diabetes mellitus (HCC)    a. 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 b. 05/2012 A1c 12.6.    Patient Active Problem List   Diagnosis Date Noted    Musculoskeletal back pain 04/04/2018   Chest pain, rule out acute myocardial infarction 04/03/2018   HOCM (hypertrophic obstructive cardiomyopathy) (HCC) 04/03/2017   Bell's palsy 03/11/2017   Healthcare maintenance 01/17/2017   Cholelithiasis 01/15/2017   Murmur 03/07/2014   Atrial fibrillation (HCC) 05/23/2012   Essential hypertension 05/23/2012   Diabetes mellitus (HCC) 05/23/2012    Past Surgical History:  Procedure Laterality Date   CESAREAN SECTION  2005; 2006   CHOLECYSTECTOMY N/A 03/25/2017   Procedure: LAPAROSCOPIC CHOLECYSTECTOMY;  Surgeon: Sheliah HatchKinsinger, De BlanchLuke Aaron, MD;  [facility];  Service: General;  Laterality: N/A;   HERNIA REPAIR     VENTRAL HERNIA REPAIR  02/05/2005   with mesh     OB History   No obstetric history on file.      Home Medications    Prior to Admission medications   Medication Sig Start Date End Date Taking? Authorizing Provider  Empagliflozin-metFORMIN HCl (SYNJARDY) 05-998 MG TABS Take 1 tablet by mouth 2 (two) times daily with a meal. 04/27/18  Yes Santos-Sanchez, Chelsea PrimusIdalys, MD  ibuprofen (ADVIL) 200 MG tablet Take 400 mg by mouth every 6 (six) hours as needed for moderate pain.   Yes [provider]  metoprolol succinate (TOPROL-XL) 50 MG 24 hr tablet TAKE 1 TABLET BY MOUTH DAILY. TAKE WITH OR IMMEDIATELY FOLLOWING A MEAL. Patient taking differently: Take 50 mg by mouth daily.  04/27/18  Yes Santos-Sanchez, Chelsea PrimusIdalys, MD  rivaroxaban (XARELTO) 20 MG TABS  tablet Take 1 tablet (20 mg total) by mouth daily with supper. 04/27/18  Yes Santos-Sanchez, Chelsea PrimusIdalys, MD  verapamil (CALAN-SR) 180 MG CR tablet Take 1 tablet (180 mg total) by mouth daily. 04/27/18  Yes Burna CashSantos-Sanchez, Idalys, MD    Family History Family History  Problem Relation Age of Onset   Other Father        killed in [place] when he was in his 3250's   Asthma Mother        alive in her 3460's   Other Sister        A & W   Other  Sister        A & W   Heart disease Neg Hx     Social History Social History   Tobacco Use   Smoking status: Never Smoker   Smokeless tobacco: Never Used  Substance Use Topics   Alcohol use: Never    Frequency: Never   Drug use: Never     Allergies   Patient has no known allergies.   Review of Systems Review of Systems  Constitutional: Negative.   HENT: Negative.   Respiratory: Negative.   Cardiovascular: Negative.   Gastrointestinal: Positive for abdominal pain, nausea and vomiting. Negative for abdominal distention, anal bleeding, blood in stool, constipation, diarrhea and rectal pain.  Genitourinary: Negative.   Musculoskeletal: Negative.   Neurological: Negative.   All other systems reviewed and are negative.    Physical Exam Updated Vital Signs BP (!) 149/82 (BP Location: Left Arm)    Pulse 72    Temp 98.1 F (36.7 C) (Oral)    Resp (!) 21    LMP 10/01/2006    SpO2 97%   Physical Exam Vitals signs and nursing note reviewed.  Constitutional:      General: She is not in acute distress.    Appearance: She is well-developed. She is not ill-appearing, toxic-appearing or diaphoretic.  HENT:     Head: Normocephalic and atraumatic.     Mouth/Throat:     Mouth: Mucous membranes are moist.     Pharynx: Oropharynx is clear.  Eyes:     Pupils: Pupils are equal, round, and reactive to light.  Neck:     Musculoskeletal: Normal range of motion.  Cardiovascular:     Rate and Rhythm: Normal rate.     Heart sounds: Normal heart sounds. No murmur. No friction rub. No gallop.   Pulmonary:     Effort: Pulmonary effort is normal. No respiratory distress.     Breath sounds: Normal breath sounds. No stridor. No wheezing, rhonchi or rales.  Chest:     Chest wall: No tenderness.  Abdominal:     General: Bowel sounds are normal. There is distension.     Palpations: Abdomen is soft.     Tenderness: There is generalized abdominal tenderness and tenderness in the right  lower quadrant. There is no right CVA tenderness, left CVA tenderness, guarding or rebound. Negative signs include McBurney's sign.     Hernia: A hernia is present. Hernia is present in the ventral area.  Musculoskeletal: Normal range of motion.  Skin:    General: Skin is warm and dry.     Capillary Refill: Capillary refill takes less than 2 seconds.     Coloration: Skin is not pale.     Findings: No erythema or rash.  Neurological:     General: No focal deficit present.     Mental Status: She is alert.  ED Treatments / Results  Labs (all labs ordered are listed, but only abnormal results are displayed) Labs Reviewed  COMPREHENSIVE METABOLIC PANEL - Abnormal; Notable for the following components:      Result Value   Glucose, Bld 335 (*)    Total Protein 8.2 (*)    All other components within normal limits  CBC - Abnormal; Notable for the following components:   WBC 12.7 (*)    RBC 5.19 (*)    MCH 25.2 (*)    All other components within normal limits  URINALYSIS, ROUTINE W REFLEX MICROSCOPIC - Abnormal; Notable for the following components:   Glucose, UA >=500 (*)    Leukocytes,Ua TRACE (*)    All other components within normal limits  CBG MONITORING, ED - Abnormal; Notable for the following components:   Glucose-Capillary 347 (*)    All other components within normal limits  LIPASE, BLOOD  I-STAT BETA HCG BLOOD, ED ([facility], [facility], [facility] ONLY)    EKG EKG Interpretation  Date/Time:  Monday June 13 2018 06:34:32 EDT Ventricular Rate:  72 PR Interval:    QRS Duration: 90 QT Interval:  429 QTC Calculation: 470 R Axis:   92 Text Interpretation:  Sinus rhythm Consider left atrial enlargement Borderline right axis deviation When compared with ECG of 04/04/2018, No significant change was found Confirmed by Dione BoozeGlick, David ([id number]) on 06/13/2018 6:40:15 AM   Radiology Ct Abdomen Pelvis W Contrast  Result Date: 06/13/2018 CLINICAL DATA:  Abdominal pain, acute, generalized. Abdominal  distension and tightness. Diabetes. Ventral hernia repair. EXAM: CT ABDOMEN AND PELVIS WITH CONTRAST TECHNIQUE: Multidetector CT imaging of the abdomen and pelvis was performed using the standard protocol following bolus administration of intravenous contrast. CONTRAST:  100mL OMNIPAQUE IOHEXOL 300 MG/ML  SOLN COMPARISON:  CT of the abdomen and pelvis 12/24/2016 FINDINGS: Lower chest: Lung bases are clear without focal nodule, mass, or airspace disease. Calcifications are present at the mitral annulus. Heart size is normal. No significant pleural or pericardial effusion is present. Hepatobiliary: There is diffuse fatty infiltration of the liver. No discrete lesions are present. Cholecystectomy is noted. Common bile duct is within normal limits. Pancreas: Unremarkable. No pancreatic ductal dilatation or surrounding inflammatory changes. Spleen: Normal in size without focal abnormality. Adrenals/Urinary Tract: Hands adrenal glands are normal bilaterally. Kidneys and ureters are within normal limits. There is no stone or mass lesion. No obstruction is present. Stomach/Bowel: The stomach and duodenum are within normal limits. Mildly distended loops of small bowel are noted in the lower abdomen and pelvis. The transition point is at the level of the calcified mesh repair associated with the patient's ventral hernia. There is some free fluid. No free air is present. No discrete abscess is present. The cecum and ascending colon are normal. Transverse colon is normal. Descending and sigmoid colon are normal. Vascular/Lymphatic: No significant vascular findings are present. No enlarged abdominal or pelvic lymph nodes. Reproductive: Uterus and bilateral adnexa are unremarkable. Other: There is chronic calcification about the ventral hernia mesh repair. No new hernias are present. Free fluid is present. Musculoskeletal: Vertebral body heights and alignment are maintained. Pelvis is normal. Hips are located and within normal  limits. IMPRESSION: 1. Distended loops of small bowel with transition point near the chronically calcified ventral hernia mesh repair. This likely reflects small bowel adhesions. Most proximal small bowel is not dilated. This represents an early focal small-bowel obstruction. 2. Inflammatory changes are associated with some free fluid. No free air or evidence for perforation is  present. 3. Hepatic steatosis. 4. Cholecystectomy. 5. Stable appearance of calcified mesh repair for ventral hernia. Electronically Signed   By: San Morelle M.D.   On: 06/13/2018 06:55    Procedures Procedures (including critical care time)  Medications Ordered in ED Medications  sodium chloride flush (NS) 0.9 % injection 3 mL (has no administration in time range)  ondansetron (ZOFRAN) injection 4 mg (has no administration in time range)  sodium chloride 0.9 % bolus 1,000 mL (1,000 mLs Intravenous New Bag/Given 06/13/18 0551)  morphine 4 MG/ML injection 4 mg (4 mg Intravenous Given 06/13/18 0552)  iohexol (OMNIPAQUE) 300 MG/ML solution 100 mL (100 mLs Intravenous Contrast Given 06/13/18 0601)     Initial Impression / Assessment and Plan / ED Course  I have reviewed the triage vital signs and the nursing notes.  Pertinent labs & imaging results that were available during my care of the patient were reviewed by me and considered in my medical decision making (see chart for details).  48 year old female appears otherwise well presents for evaluation abdominal pain.  Afebrile, nonseptic, non-ill-appearing.  Began 12 hours PTA.  Described as aching sensation and generalized in nature however worse to the right lower quadrant.  Abdomen soft without rebound or guarding.  There is mild abdominal distention.  She does have what appears to be a ventral wall hernia.  She has negative McBurney point, psoas obturator sign.  She does have dysuria however urinalysis negative for infection.  Heart and lungs clear.  He does have  history of prolonged QT interval.  Will obtain EKG to assess prior to antiemetics given.  Work-up started in triage.  CBC with mild leukocytosis at 16.1, metabolic panel significant for hyperglycemia at 335 no additional electrolyte, renal or liver abnormalities anion gap 11, lipase 31, HCG negative.  Given pain in right lower quadrant will obtain CT scan to r/o appendicitis vs SBO.  CT pending at care transfer. Patient without emesis and sleeping comfortably on reevaluation.  Care transferred to soda, PA-C will determine ultimate treatment, plan disposition.  Patient continues to be hemodynamically stable at this time.      Final Clinical Impressions(s) / ED Diagnoses   Final diagnoses:  Generalized abdominal pain  Non-intractable vomiting with nausea, unspecified vomiting type    ED Discharge Orders    None       England Greb A, PA-C 09/60/45 [id number]    Delora Fuel, MD 11/91/47 [phone]

## 2018-06-13 NOTE — ED Notes (Signed)
Nurse Navigator communication: The patient has a cell phone at bedside and is able to give updates to family.     

## 2018-06-13 NOTE — ED Triage Notes (Signed)
C/o abd distention and tightness since 6pm.  States it feels like she has gas.  Denies nausea, vomiting, diarrhea, constipation.

## 2018-06-13 NOTE — ED Notes (Signed)
Pt declines zofan at this time

## 2018-06-13 NOTE — ED Provider Notes (Signed)
  Physical Exam  BP (!) 149/82 (BP Location: Left Arm)   Pulse 72   Temp 98.1 F (36.7 C) (Oral)   Resp (!) 21   LMP 10/01/2006   SpO2 97%   Physical Exam  ED Course/Procedures     Procedures  MDM  Patient care received from [facility] PA at shift change, please see her note for a full HPI. Briefly, patient with a RLQ pain since yesterday along with 1 episode of emesis, given 4 mg morphine for pain control and zofran for nausea with improvement. Pending CT results. Does have a hx of prolonged QTc ~400 today on EKG. Labs were reviewed by my colleague.CBC with a remarkable WBC.  CT abdomen and pelvis showed: 1. Distended loops of small bowel with transition point near the  chronically calcified ventral hernia mesh repair. This likely  reflects small bowel adhesions. Most proximal small bowel is not  dilated. This represents an early focal small-bowel obstruction.  2. Inflammatory changes are associated with some free fluid. No free  air or evidence for perforation is present.  3. Hepatic steatosis.  4. Cholecystectomy.  5. Stable appearance of calcified mesh repair for ventral hernia.       Patient was reassessed by me, pain is controlled, no emesis. Call placed for general surgery consult as this is an early bowel obstruction. 8:07 AM Spoke to general surgery, recommendations for NG tube.  At this time due to patient's comorbidities will admit through hospitalist service.  General surgery to follow. 9:06 AM Spoke to teaching service who will admit patient for further management of small bowel obstruction.     Janeece Fitting, PA-C 06/13/18 0350    Pattricia Boss, MD 06/15/18 [phone]

## 2018-06-13 NOTE — Progress Notes (Signed)
Patient was transferred to 6N at [place]. Alert and oriented x4. No pain complained. Vital signs was taken. Room is set up. Call light is within patient's reach.

## 2018-06-13 NOTE — H&P (Addendum)
Date: 06/13/2018               Patient Name:  [name] MRN: [id number]  DOB: [birth year] Age / Sex: 48 y.o., female   PCP: Sarah Roche, MD         Medical Service: Internal Medicine Teaching Service         Attending Physician: Dr. Rebeca Alert Raynaldo Opitz, MD    First Contact: Dr. Truman [name] Pager: [phone]  Second Contact: Dr. Berline [name] Pager: [phone]       After Hours (After 5p/  First Contact Pager: [phone]  weekends / holidays): Second Contact Pager: [phone]   Chief Complaint: abd pain, nausea  History of Present Illness:  [name] is a 47yo female with PMH of HOCM, A fib on Xarelto, T2DM, hypertension,   Patient presents with diffuse abdominal pain associated with nausea since 6pm last night that has been progressive leading her to seek evaluation in the ED this morning. On arrival to the ED she did have an episode of large volume emesis (w/o hematemesis) which improved her symptoms. She endorses last bowel movement early this morning (soft, w/o hematochezia or melena) and she has passed flatus once since being in the ED. She endorses a couple of days of mild dysuria w/o hematuria or pyuria; she denies vaginal discharge. She denies fevers, chills, cough, chest pain, shortness of breath, palpitations, headaches, dizziness.   In the ED, she was hemodynamically stable and afebrile. CBC unremarkable. CMP remarkable only for glucose of 335. UA reveals glucose and some WBC on microscopy but negative for nitrites or leukocyte esterase. Beta hCG was negative. CT abd/pel reveals distended loops of small bowel with transition point near the chronically calcified ventral hernia mesh repair likely reflecting adhesions; interpreted as early focal small bowel obstruction. Surgery was consulted and will see patient. IMTS was asked to admit.   Meds:  Current Meds  Medication Sig  . Empagliflozin-metFORMIN HCl (SYNJARDY) 05-998 MG TABS Take 1 tablet by mouth 2 (two) times daily with a  meal.  . ibuprofen (ADVIL) 200 MG tablet Take 400 mg by mouth every 6 (six) hours as needed for moderate pain.  . metoprolol succinate (TOPROL-XL) 50 MG 24 hr tablet TAKE 1 TABLET BY MOUTH DAILY. TAKE WITH OR IMMEDIATELY FOLLOWING A MEAL. (Patient taking differently: Take 50 mg by mouth daily. )  . rivaroxaban (XARELTO) 20 MG TABS tablet Take 1 tablet (20 mg total) by mouth daily with supper.  . verapamil (CALAN-SR) 180 MG CR tablet Take 1 tablet (180 mg total) by mouth daily.   Allergies: Allergies as of 06/13/2018  . (No Known Allergies)   Past Medical History:  Diagnosis Date  . Bell's palsy 03/2017  . Gallstones   . Gestational diabetes mellitus (GDM) 2005; 2006  . Headache    "monthly" (03/12/2017)  . History of chest pain   . History of prolonged Q-T interval on ECG    a. 09/2012 adm: QTC 506.  Marland Kitchen Hypertension   . PAF (paroxysmal atrial fibrillation) ([place]) 2012   a. 07/2011 ED visit with AF-> converted with flecainide, never followed-up;  b. 05/2012 Recurrent Afib -echo: EF 65-60%, mildly dil LA. Placed on Xarelto/daily Flecainide. c. 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  . Small bowel obstruction ([place]) 09/2010   resolved  . Type II diabetes mellitus ([place])    a. 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 b. 05/2012 A1c 12.6.    Family History:  Asthma in her mother  Social History: Denies tobacco, alcohol or illicit drug use.  Review of Systems: A complete ROS was negative except as per HPI.   Physical Exam: Blood pressure (!) 149/82, pulse 72, temperature 98.1 F (36.7 C), temperature source Oral, resp. rate (!) 21, last menstrual period 10/01/2006, SpO2 97 %. GENERAL- alert, co-operative, appears as stated age, not in any distress. HEENT- Atraumatic, normocephalic, oral mucosa appears moist CARDIAC- RRR, no murmurs, rubs or gallops. RESP- Moving equal volumes of air, and clear to auscultation bilaterally, no wheezes or crackles. ABDOMEN- Soft,  bowel sounds present, mild right sided tenderness to palpation. NEURO- No obvious Cr N abnormality. EXTREMITIES- pulse 2+, symmetric, no pedal edema. SKIN- Warm, dry, no rash or lesion. PSYCH- Normal mood and affect, appropriate thought content and speech.  EKG: personally reviewed my interpretation is sinus rhythm, chronic J point elevation in anterior leads  CT abd/pel w contrast 06/13/18: 1. Distended loops of small bowel with transition point near the chronically calcified ventral hernia mesh repair. This likely reflects small bowel adhesions. Most proximal small bowel is not dilated. This represents an early focal small-bowel obstruction. 2. Inflammatory changes are associated with some free fluid. No free air or evidence for perforation is present. 3. Hepatic steatosis. 4. Cholecystectomy. 5. Stable appearance of calcified mesh repair for ventral hernia.  Assessment & Plan by Problem: Active Problems:   Atrial fibrillation (HCC)   Essential hypertension   Diabetes mellitus (HCC)   HOCM (hypertrophic obstructive cardiomyopathy) (HCC)   SBO (small bowel obstruction) (HCC)  Small bowel obstruction: Patient with N/V and CT consistent with transition point related to previous ventral hernia repair with mesh. Patient has already had a BM and has passed flatus; symptoms of pain improving after emesis earlier today. This likely represents a resolving obstruction. No indication for NGT at this time; surgery following. --NPO --LR + 20mEq K  Paroxismal A fib: In sinus rhythm today. She is on Xarelto, last dose was 2 days ago. She has no history of clots. --prophylactic dose lovenox  HOCM HTN: Will hold metoprolol and verapamil for now as NPO. HD stable currently.  T2DM: At home on synjardi. Hyperglycemic to 330s on admission. --q4hr cbg with SSI while NPO  Diet: NPO IV: LR + 20mEq K VTE ppx: lovenox>xarelto when tolerating PO Code: FULL  Dispo: Admit patient to Observation  with expected length of stay less than 2 midnights.  Signed: Nyra [name], Sarah Cassaday, MD 06/13/2018, 10:56 AM

## 2018-06-13 NOTE — ED Notes (Signed)
ED TO INPATIENT HANDOFF REPORT  ED Nurse Name and Phone #: [phone]  S Name/Age/Gender [name] 48 y.o. female Room/Bed: 056C/056C  Code Status   Code Status: Full Code  Home/SNF/Other Home Patient oriented to: self, place, time and situation Is this baseline? Yes   Triage Complete: Triage complete  Chief Complaint stomache pains.   Triage Note C/o abd distention and tightness since 6pm.  States it feels like she has gas.  Denies nausea, vomiting, diarrhea, constipation.   Allergies No Known Allergies  Level of Care/Admitting Diagnosis ED Disposition    ED Disposition Condition Comment   Admit  [facility] [[id number]]  Level of Care: Med-Surg [16]  Covid Evaluation: Confirmed COVID Negative  Diagnosis: Small bowel obstruction [facility](HCC) [[id number]]  Admitting Physician: Anne ShutterAINES, ALEXANDER N [[id number]]  Attending Physician: Anne ShutterRAINES, ALEXANDER N [[id number]]  Estimated length of stay: past midnight tomorrow  Certification:: I certify this patient will need inpatient services for at least 2 midnights  PT Class (Do Not Modify): Inpatient [101]  PT Acc Code (Do Not Modify): Private [1]       B Medical/Surgery History Past Medical History:  Diagnosis Date  . Bell's palsy 03/2017  . Gallstones   . Gestational diabetes mellitus (GDM) 2005; 2006  . Headache    "monthly" (03/12/2017)  . History of chest pain   . History of prolonged Q-T interval on ECG    a. 09/2012 adm: QTC 506.  Marland Kitchen. Hypertension   . PAF (paroxysmal atrial fibrillation) (HCC) 2012   a. 07/2011 ED visit with AF-> converted with flecainide, never followed-up;  b. 05/2012 Recurrent Afib -echo: EF 65-60%, mildly dil LA. Placed on Xarelto/daily Flecainide. c. 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  . Small bowel obstruction (HCC) 09/2010   resolved  . Type II diabetes mellitus (HCC)    a. 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 b. 05/2012 A1c 12.6.   Past  Surgical History:  Procedure Laterality Date  . CESAREAN SECTION  2005; 2006  . CHOLECYSTECTOMY N/A 03/25/2017   Procedure: LAPAROSCOPIC CHOLECYSTECTOMY;  Surgeon: Kinsinger, De BlanchLuke Aaron, MD;  [facility];  Service: General;  Laterality: N/A;  . HERNIA REPAIR    . VENTRAL HERNIA REPAIR  02/05/2005   with mesh     A IV Location/Drains/Wounds Patient Lines/Drains/Airways Status   Active Line/Drains/Airways    Name:   Placement date:   Placement time:   Site:   Days:   Peripheral IV 06/13/18 Right Antecubital   06/13/18    0550    Antecubital   less than 1   Incision (Closed) 03/25/17 N/A   03/25/17    1034     445          Intake/Output Last 24 hours  Intake/Output Summary (Last 24 hours) at 06/13/2018 1616 Last data filed at 06/13/2018 0757 Gross per 24 hour  Intake -  Output 300 ml  Net -300 ml    Labs/Imaging Results for orders placed or performed during the hospital encounter of 06/13/18 (from the past 48 hour(s))  Lipase, blood     Status: None   Collection Time: 06/13/18  2:23 AM  Result Value Ref Range   Lipase 31 11 - 51 U/L    Comment: Performed at [facility], [address] [place], [place] [address]  Comprehensive metabolic panel     Status: Abnormal   Collection Time: 06/13/18  2:23 AM  Result Value Ref Range   Sodium 136 135 - 145 mmol/L   Potassium 4.4 3.5 - 5.1 mmol/L   Chloride 98 98 - 111 mmol/L   CO2 27 22 - 32 mmol/L   Glucose, Bld 335 (H) 70 - 99 mg/dL   BUN 14 6 - 20 mg/dL   Creatinine, Ser 1.610.98 0.44 - 1.00 mg/dL   Calcium 9.4 8.9 - 09.610.3 mg/dL   Total Protein 8.2 (H) 6.5 - 8.1 g/dL   Albumin 4.0 3.5 - 5.0 g/dL   AST 33 15 - 41 U/L   ALT 44 0 - 44 U/L   Alkaline Phosphatase 102 38 - 126 U/L   Total Bilirubin 0.6 0.3 - 1.2 mg/dL   GFR calc non Af Amer >60 >60 mL/min   GFR calc Af Amer >60 >60 mL/min   Anion gap 11 5 - 15    Comment: Performed at [facility], [address] [place], [place] [address]  CBC      Status: Abnormal   Collection Time: 06/13/18  2:23 AM  Result Value Ref Range   WBC 12.7 (H) 4.0 - 10.5 K/uL   RBC 5.19 (H) 3.87 - 5.11 MIL/uL   Hemoglobin 13.1 12.0 - 15.0 g/dL   HCT 98.142.1 19.136.0 - 47.846.0 %   MCV 81.1 80.0 - 100.0 fL   MCH 25.2 (L) 26.0 - 34.0 pg   MCHC 31.1 30.0 - 36.0 g/dL   RDW 29.514.0 62.111.5 - 30.815.5 %   Platelets 294 150 - 400 K/uL   nRBC 0.0 0.0 - 0.2 %    Comment: Performed at [facility], [address] [place], [place] [address]  Hemoglobin A1c     Status: Abnormal   Collection Time: 06/13/18  2:23 AM  Result Value Ref Range   Hgb A1c MFr Bld 11.7 (H) 4.8 - 5.6 %    Comment: (NOTE) Pre diabetes:          5.7%-6.4% Diabetes:              >6.4% Glycemic control for   <7.0% adults with diabetes    Mean Plasma Glucose 289.09 mg/dL    Comment: Performed at [facility], [address] [place], [place] [address]  Urinalysis, Routine w reflex microscopic     Status: Abnormal   Collection Time: 06/13/18  2:30 AM  Result Value Ref Range   Color, Urine YELLOW YELLOW   APPearance CLEAR CLEAR   Specific Gravity, Urine 1.025 1.005 - 1.030   pH 5.0 5.0 - 8.0   Glucose, UA >=500 (A) NEGATIVE mg/dL   Hgb urine dipstick NEGATIVE NEGATIVE   Bilirubin Urine NEGATIVE NEGATIVE   Ketones, ur NEGATIVE NEGATIVE mg/dL   Protein, ur NEGATIVE NEGATIVE mg/dL   Nitrite NEGATIVE NEGATIVE   Leukocytes,Ua TRACE (A) NEGATIVE   RBC / HPF 0-5 0 - 5 RBC/hpf   WBC, UA 6-10 0 - 5 WBC/hpf   Bacteria, UA NONE SEEN NONE SEEN   Squamous Epithelial / LPF 0-5 0 - 5   Mucus PRESENT     Comment: Performed at [facility], [address] [place], [place] [address]  I-Stat beta hCG blood, ED     Status: None   Collection Time: 06/13/18  2:33 AM  Result Value Ref Range   I-stat hCG, quantitative <5.0 <5 mIU/mL   Comment 3            Comment:   GEST. AGE  CONC.  (mIU/mL)   <=1 WEEK        5 - 50     2 WEEKS       50 - 500     3 WEEKS       100 - 10,000     4 WEEKS      1,000 - 30,000        FEMALE AND NON-PREGNANT FEMALE:     LESS THAN 5 mIU/mL   CBG monitoring, ED     Status: Abnormal   Collection Time: 06/13/18  3:59 AM  Result Value Ref Range   Glucose-Capillary 347 (H) 70 - 99 mg/dL  SARS Coronavirus 2     Status: None   Collection Time: 06/13/18  8:04 AM  Result Value Ref Range   SARS Coronavirus 2 NOT DETECTED NOT DETECTED    Comment: (NOTE) SARS-CoV-2 target nucleic acids are NOT DETECTED. The SARS-CoV-2 RNA is generally detectable in upper and lower respiratory specimens during the acute phase of infection.  Negative  results do not preclude SARS-CoV-2 infection, do not rule out co-infections with other pathogens, and should not be used as the sole basis for treatment or other patient management decisions.  Negative results must be combined with clinical observations, patient history, and epidemiological information. The expected result is Not Detected. Fact Sheet for Patients: http://www.biofiredefense.com/wp-content/uploads/2020/03/BIOFIRE-COVID -19-patients.pdf Fact Sheet for Healthcare Providers: http://www.biofiredefense.com/wp-content/uploads/2020/03/BIOFIRE-COVID -19-hcp.pdf This test is not yet approved or cleared by the [place] and  has been authorized for detection and/or diagnosis of SARS-CoV-2 by FDA under an Emergency Use Authorization (EUA).  This EUA will remain in effec t (meaning this test can be used) for the duration of  the COVID-19 declaration under Section 564(b)(1) of the Act, 21 U.S.C. section 360bbb-3(b)(1), unless the authorization is terminated or revoked sooner. Performed at [facility], [place] [address] [place], [place] [address]   CBG monitoring, ED     Status: Abnormal   Collection Time: 06/13/18 12:23 PM  Result Value Ref Range   Glucose-Capillary 173 (H) 70 - 99 mg/dL   Comment 1 Notify RN    Comment 2 Document in Chart    Ct Abdomen Pelvis W Contrast  Result Date:  06/13/2018 CLINICAL DATA:  Abdominal pain, acute, generalized. Abdominal distension and tightness. Diabetes. Ventral hernia repair. EXAM: CT ABDOMEN AND PELVIS WITH CONTRAST TECHNIQUE: Multidetector CT imaging of the abdomen and pelvis was performed using the standard protocol following bolus administration of intravenous contrast. CONTRAST:  193mL OMNIPAQUE IOHEXOL 300 MG/ML  SOLN COMPARISON:  CT of the abdomen and pelvis 12/24/2016 FINDINGS: Lower chest: Lung bases are clear without focal nodule, mass, or airspace disease. Calcifications are present at the mitral annulus. Heart size is normal. No significant pleural or pericardial effusion is present. Hepatobiliary: There is diffuse fatty infiltration of the liver. No discrete lesions are present. Cholecystectomy is noted. Common bile duct is within normal limits. Pancreas: Unremarkable. No pancreatic ductal dilatation or surrounding inflammatory changes. Spleen: Normal in size without focal abnormality. Adrenals/Urinary Tract: Hands adrenal glands are normal bilaterally. Kidneys and ureters are within normal limits. There is no stone or mass lesion. No obstruction is present. Stomach/Bowel: The stomach and duodenum are within normal limits. Mildly distended loops of small bowel are noted in the lower abdomen and pelvis. The transition point is at the level of the calcified mesh repair associated with the patient's ventral hernia. There is some free fluid. No free air is present. No discrete abscess  is present. The cecum and ascending colon are normal. Transverse colon is normal. Descending and sigmoid colon are normal. Vascular/Lymphatic: No significant vascular findings are present. No enlarged abdominal or pelvic lymph nodes. Reproductive: Uterus and bilateral adnexa are unremarkable. Other: There is chronic calcification about the ventral hernia mesh repair. No new hernias are present. Free fluid is present. Musculoskeletal: Vertebral body heights and alignment  are maintained. Pelvis is normal. Hips are located and within normal limits. IMPRESSION: 1. Distended loops of small bowel with transition point near the chronically calcified ventral hernia mesh repair. This likely reflects small bowel adhesions. Most proximal small bowel is not dilated. This represents an early focal small-bowel obstruction. 2. Inflammatory changes are associated with some free fluid. No free air or evidence for perforation is present. 3. Hepatic steatosis. 4. Cholecystectomy. 5. Stable appearance of calcified mesh repair for ventral hernia. Electronically Signed   By: Marin Robertshristopher  Mattern M.D.   On: 06/13/2018 06:55    Pending Labs Unresulted Labs (From admission, onward)    Start     Ordered   06/14/18 0500  CBC  Tomorrow morning,   R     06/13/18 0947   06/14/18 0500  Basic metabolic panel  Tomorrow morning,   R     06/13/18 0947   06/14/18 0500  Magnesium  Tomorrow morning,   R     06/13/18 0947   06/14/18 0500  Phosphorus  Tomorrow morning,   R     06/13/18 0947          Vitals/Pain Today's Vitals   06/13/18 0714 06/13/18 0728 06/13/18 1342 06/13/18 1540  BP:   130/75   Pulse:   78   Resp:   18   Temp:   98.1 F (36.7 C)   TempSrc:   Oral   SpO2:   98%   PainSc: 6  6   0-No pain    Isolation Precautions No active isolations  Medications Medications  sodium chloride flush (NS) 0.9 % injection 3 mL (3 mLs Intravenous Not Given 06/13/18 0729)  enoxaparin (LOVENOX) injection 40 mg (has no administration in time range)  lactated ringers 1,000 mL with potassium chloride 20 mEq infusion ( Intravenous New Bag/Given 06/13/18 1439)  acetaminophen (TYLENOL) tablet 650 mg (has no administration in time range)    Or  acetaminophen (TYLENOL) suppository 650 mg (has no administration in time range)  promethazine (PHENERGAN) tablet 12.5 mg (has no administration in time range)  insulin aspart (novoLOG) injection 0-9 Units (has no administration in time range)   HYDROmorphone (DILAUDID) injection 0.5-1 mg (has no administration in time range)  sodium chloride 0.9 % bolus 1,000 mL (0 mLs Intravenous Stopped 06/13/18 0751)  morphine 4 MG/ML injection 4 mg (4 mg Intravenous Given 06/13/18 0552)  iohexol (OMNIPAQUE) 300 MG/ML solution 100 mL (100 mLs Intravenous Contrast Given 06/13/18 0601)    Mobility walks Low fall risk   Focused Assessments   R Recommendations: See Admitting Provider Note  Report given to:   Additional Notes:

## 2018-06-13 NOTE — Consult Note (Signed)
[facility] Consult/Admission Note  [name] 16-Feb-1970  [id number].    Requesting MD: Dr. Rosalia Hammersay Chief Complaint/Reason for Consult: SBO  HPI:   Pt is a pleasant 48 yo female with a hx of A fib on Xarelto, T2DM, hypertension, ventral hernia repair with mesh in 2007 who presented to ED with complaints of abdominal pain. Pt states pain started around 1800 last night around her umbilicus and RLQ. Pain progressively worsened to severe, constant, and non radiating. She had flatus, a small BM and one episode of non bloody, non bilious vomiting in the waiting room of the ED. Currently her pain is improved. She is not nauseated and has not had additional vomiting. She states a similar episode of pain in 2012. She denies other symptoms. Other abdominal surgeries include 2 C sections and cholecystectomy.  ROS:  Review of Systems  Constitutional: Negative for chills, diaphoresis and fever.  HENT: Negative for sore throat.   Respiratory: Negative for cough and shortness of breath.   Cardiovascular: Negative for chest pain.  Gastrointestinal: Positive for abdominal pain, nausea and vomiting. Negative for blood in stool, constipation and diarrhea.  Genitourinary: Negative for dysuria.  Skin: Negative for rash.  Neurological: Negative for dizziness and loss of consciousness.  All other systems reviewed and are negative.    Family History  Problem Relation Age of Onset  . Other Father        killed in [place] war when he was in his 7550's  . Asthma Mother        alive in her 4660's  . Other Sister        A & W  . Other Sister        A & W  . Heart disease Neg Hx     Past Medical History:  Diagnosis Date  . Bell's palsy 03/2017  . Gallstones   . Gestational diabetes mellitus (GDM) 2005; 2006  . Headache    "monthly" (03/12/2017)  . History of chest pain   . History of prolonged Q-T interval on ECG    a. 09/2012 adm: QTC 506.  Marland Kitchen. Hypertension   . PAF (paroxysmal  atrial fibrillation) (HCC) 2012   a. 07/2011 ED visit with AF-> converted with flecainide, never followed-up;  b. 05/2012 Recurrent Afib -echo: EF 65-60%, mildly dil LA. Placed on Xarelto/daily Flecainide. c. 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  . Small bowel obstruction (HCC) 09/2010   resolved  . Type II diabetes mellitus (HCC)    a. 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 b. 05/2012 A1c 12.6.    Past Surgical History:  Procedure Laterality Date  . CESAREAN SECTION  2005; 2006  . CHOLECYSTECTOMY N/A 03/25/2017   Procedure: LAPAROSCOPIC CHOLECYSTECTOMY;  Surgeon: Kinsinger, De BlanchLuke Aaron, MD;  [facility];  Service: General;  Laterality: N/A;  . HERNIA REPAIR    . VENTRAL HERNIA REPAIR  02/05/2005   with mesh    Social History:  reports that she has never smoked. She has never used smokeless tobacco. She reports that she does not drink alcohol or use drugs.  Allergies: No Known Allergies  (Not in a hospital admission)   Blood pressure (!) 149/82, pulse 72, temperature 98.1 F (36.7 C), temperature source Oral, resp. rate (!) 21, last menstrual period 10/01/2006, SpO2 97 %.  Physical Exam Constitutional:      General: She is not in acute distress.    Appearance: Normal appearance.  She is not diaphoretic.  HENT:     Head: Normocephalic and atraumatic.     Nose: Nose normal.     Mouth/Throat:     Comments: Pt wearing mask Eyes:     General: Lids are normal. No scleral icterus.       Right eye: No discharge.        Left eye: No discharge.     Conjunctiva/sclera: Conjunctivae normal.     Pupils: Pupils are equal, round, and reactive to light.  Neck:     Musculoskeletal: Normal range of motion and neck supple.  Cardiovascular:     Rate and Rhythm: Normal rate and regular rhythm.     Pulses:          Radial pulses are 2+ on the right side and 2+ on the left side.       Dorsalis pedis pulses are 2+ on the right side and 2+ on  the left side.     Heart sounds: Normal heart sounds. No murmur.  Pulmonary:     Effort: Pulmonary effort is normal. No respiratory distress.     Breath sounds: Normal breath sounds. No wheezing, rhonchi or rales.  Abdominal:     General: Bowel sounds are normal. There is no distension.     Palpations: Abdomen is soft. Abdomen is not rigid.     Tenderness: There is abdominal tenderness in the periumbilical area. There is no guarding.     Comments: Firmness noted around umbilicus likely related to mesh, this area is TTP without guarding.   Musculoskeletal: Normal range of motion.        General: No tenderness or deformity.  Skin:    General: Skin is warm and dry.     Findings: No rash.  Neurological:     Mental Status: She is alert and oriented to person, place, and time.  Psychiatric:        Mood and Affect: Mood normal.        Behavior: Behavior normal.     Results for orders placed or performed during the hospital encounter of 06/13/18 (from the past 48 hour(s))  Lipase, blood     Status: None   Collection Time: 06/13/18  2:23 AM  Result Value Ref Range   Lipase 31 11 - 51 U/L    Comment: Performed at [facility], [place] [address], [place], [place] [address]  Comprehensive metabolic panel     Status: Abnormal   Collection Time: 06/13/18  2:23 AM  Result Value Ref Range   Sodium 136 135 - 145 mmol/L   Potassium 4.4 3.5 - 5.1 mmol/L   Chloride 98 98 - 111 mmol/L   CO2 27 22 - 32 mmol/L   Glucose, Bld 335 (H) 70 - 99 mg/dL   BUN 14 6 - 20 mg/dL   Creatinine, Ser 0.98 0.44 - 1.00 mg/dL   Calcium 9.4 8.9 - 10.3 mg/dL   Total Protein 8.2 (H) 6.5 - 8.1 g/dL   Albumin 4.0 3.5 - 5.0 g/dL   AST 33 15 - 41 U/L   ALT 44 0 - 44 U/L   Alkaline Phosphatase 102 38 - 126 U/L   Total Bilirubin 0.6 0.3 - 1.2 mg/dL   GFR calc non Af Amer >60 >60 mL/min   GFR calc Af Amer >60 >60 mL/min   Anion gap 11 5 - 15    Comment: Performed at [facility] [address]  [place], [place]  [id number]  CBC     Status: Abnormal   Collection Time: 06/13/18  2:23 AM  Result Value Ref Range   WBC 12.7 (H) 4.0 - 10.5 K/uL   RBC 5.19 (H) 3.87 - 5.11 MIL/uL   Hemoglobin 13.1 12.0 - 15.0 g/dL   HCT 60.442.1 54.036.0 - 98.146.0 %   MCV 81.1 80.0 - 100.0 fL   MCH 25.2 (L) 26.0 - 34.0 pg   MCHC 31.1 30.0 - 36.0 g/dL   RDW 19.114.0 47.811.5 - 29.515.5 %   Platelets 294 150 - 400 K/uL   nRBC 0.0 0.0 - 0.2 %    Comment: Performed at [facility], [address] [place], [place] [address]  Urinalysis, Routine w reflex microscopic     Status: Abnormal   Collection Time: 06/13/18  2:30 AM  Result Value Ref Range   Color, Urine YELLOW YELLOW   APPearance CLEAR CLEAR   Specific Gravity, Urine 1.025 1.005 - 1.030   pH 5.0 5.0 - 8.0   Glucose, UA >=500 (A) NEGATIVE mg/dL   Hgb urine dipstick NEGATIVE NEGATIVE   Bilirubin Urine NEGATIVE NEGATIVE   Ketones, ur NEGATIVE NEGATIVE mg/dL   Protein, ur NEGATIVE NEGATIVE mg/dL   Nitrite NEGATIVE NEGATIVE   Leukocytes,Ua TRACE (A) NEGATIVE   RBC / HPF 0-5 0 - 5 RBC/hpf   WBC, UA 6-10 0 - 5 WBC/hpf   Bacteria, UA NONE SEEN NONE SEEN   Squamous Epithelial / LPF 0-5 0 - 5   Mucus PRESENT     Comment: Performed at [facility], [address] [place], [place] [address]  I-Stat beta hCG blood, ED     Status: None   Collection Time: 06/13/18  2:33 AM  Result Value Ref Range   I-stat hCG, quantitative <5.0 <5 mIU/mL   Comment 3            Comment:   GEST. AGE      CONC.  (mIU/mL)   <=1 WEEK        5 - 50     2 WEEKS       50 - 500     3 WEEKS       100 - 10,000     4 WEEKS     1,000 - 30,000        FEMALE AND NON-PREGNANT FEMALE:     LESS THAN 5 mIU/mL   CBG monitoring, ED     Status: Abnormal   Collection Time: 06/13/18  3:59 AM  Result Value Ref Range   Glucose-Capillary 347 (H) 70 - 99 mg/dL  SARS Coronavirus 2     Status: None   Collection Time: 06/13/18  8:04 AM  Result Value Ref Range   SARS Coronavirus 2 NOT DETECTED NOT  DETECTED    Comment: (NOTE) SARS-CoV-2 target nucleic acids are NOT DETECTED. The SARS-CoV-2 RNA is generally detectable in upper and lower respiratory specimens during the acute phase of infection.  Negative  results do not preclude SARS-CoV-2 infection, do not rule out co-infections with other pathogens, and should not be used as the sole basis for treatment or other patient management decisions.  Negative results must be combined with clinical observations, patient history, and epidemiological information. The expected result is Not Detected. Fact Sheet for Patients: http://www.biofiredefense.com/wp-content/uploads/2020/03/BIOFIRE-COVID -19-patients.pdf Fact Sheet for Healthcare Providers: http://www.biofiredefense.com/wp-content/uploads/2020/03/BIOFIRE-COVID -19-hcp.pdf This test is not yet approved or cleared by the [place] FDA and  has been authorized for detection and/or diagnosis  of SARS-CoV-2 by FDA under an Emergency Use Authorization (EUA).  This EUA will remain in effec t (meaning this test can be used) for the duration of  the COVID-19 declaration under Section 564(b)(1) of the Act, 21 U.S.C. section 360bbb-3(b)(1), unless the authorization is terminated or revoked sooner. Performed at [facility], [address] [place], [place] [address]    Ct Abdomen Pelvis W Contrast  Result Date: 06/13/2018 CLINICAL DATA:  Abdominal pain, acute, generalized. Abdominal distension and tightness. Diabetes. Ventral hernia repair. EXAM: CT ABDOMEN AND PELVIS WITH CONTRAST TECHNIQUE: Multidetector CT imaging of the abdomen and pelvis was performed using the standard protocol following bolus administration of intravenous contrast. CONTRAST:  100mL OMNIPAQUE IOHEXOL 300 MG/ML  SOLN COMPARISON:  CT of the abdomen and pelvis 12/24/2016 FINDINGS: Lower chest: Lung bases are clear without focal nodule, mass, or airspace disease. Calcifications are present at the mitral annulus. Heart  size is normal. No significant pleural or pericardial effusion is present. Hepatobiliary: There is diffuse fatty infiltration of the liver. No discrete lesions are present. Cholecystectomy is noted. Common bile duct is within normal limits. Pancreas: Unremarkable. No pancreatic ductal dilatation or surrounding inflammatory changes. Spleen: Normal in size without focal abnormality. Adrenals/Urinary Tract: Hands adrenal glands are normal bilaterally. Kidneys and ureters are within normal limits. There is no stone or mass lesion. No obstruction is present. Stomach/Bowel: The stomach and duodenum are within normal limits. Mildly distended loops of small bowel are noted in the lower abdomen and pelvis. The transition point is at the level of the calcified mesh repair associated with the patient's ventral hernia. There is some free fluid. No free air is present. No discrete abscess is present. The cecum and ascending colon are normal. Transverse colon is normal. Descending and sigmoid colon are normal. Vascular/Lymphatic: No significant vascular findings are present. No enlarged abdominal or pelvic lymph nodes. Reproductive: Uterus and bilateral adnexa are unremarkable. Other: There is chronic calcification about the ventral hernia mesh repair. No new hernias are present. Free fluid is present. Musculoskeletal: Vertebral body heights and alignment are maintained. Pelvis is normal. Hips are located and within normal limits. IMPRESSION: 1. Distended loops of small bowel with transition point near the chronically calcified ventral hernia mesh repair. This likely reflects small bowel adhesions. Most proximal small bowel is not dilated. This represents an early focal small-bowel obstruction. 2. Inflammatory changes are associated with some free fluid. No free air or evidence for perforation is present. 3. Hepatic steatosis. 4. Cholecystectomy. 5. Stable appearance of calcified mesh repair for ventral hernia. Electronically  Signed   By: Marin Robertshristopher  Mattern M.D.   On: 06/13/2018 06:55      Assessment/Plan Active Problems:   SBO (small bowel obstruction) (HCC)  Hx of a fib on Xarelto HTN DM  Early SBO - CT showed Distended loops of small bowel with transition point near the chronically calcified ventral hernia mesh repair - since pt is not nauseated or vomiting can hold off on NGT for now unless she becomes nauseated. Recommend NPO, ice chips, bowel rest and ambulation.  - at this point, no surgical intervention is needed but we will follow along  Thank you for the consult.Jerre Simon.    Cambria Osten L Chiana Wamser, [facility] 06/13/2018, 10:35 AM Pager: [phone] Consults: [phone] Mon-Fri 7:00 am-4:30 pm Sat-Sun 7:00 am-11:30 am

## 2018-06-14 ENCOUNTER — Telehealth: Payer: Self-pay | Admitting: Internal Medicine

## 2018-06-14 DIAGNOSIS — R590 Localized enlarged lymph nodes: Secondary | ICD-10-CM

## 2018-06-14 LAB — CBC
HCT: 38.8 % (ref 36.0–46.0)
Hemoglobin: 12.1 g/dL (ref 12.0–15.0)
MCH: 25.3 pg — ABNORMAL LOW (ref 26.0–34.0)
MCHC: 31.2 g/dL (ref 30.0–36.0)
MCV: 81.2 fL (ref 80.0–100.0)
Platelets: 256 10*3/uL (ref 150–400)
RBC: 4.78 MIL/uL (ref 3.87–5.11)
RDW: 14.2 % (ref 11.5–15.5)
WBC: 8.4 10*3/uL (ref 4.0–10.5)
nRBC: 0 % (ref 0.0–0.2)

## 2018-06-14 LAB — PHOSPHORUS: Phosphorus: 4.3 mg/dL (ref 2.5–4.6)

## 2018-06-14 LAB — BASIC METABOLIC PANEL
Anion gap: 9 (ref 5–15)
BUN: 7 mg/dL (ref 6–20)
CO2: 28 mmol/L (ref 22–32)
Calcium: 9.2 mg/dL (ref 8.9–10.3)
Chloride: 103 mmol/L (ref 98–111)
Creatinine, Ser: 0.78 mg/dL (ref 0.44–1.00)
GFR calc Af Amer: >60 mL/min (ref >60)
GFR calc non Af Amer: >60 mL/min (ref >60)
Glucose, Bld: 137 mg/dL — ABNORMAL HIGH (ref 70–99)
Potassium: 3.9 mmol/L (ref 3.5–5.1)
Sodium: 140 mmol/L (ref 135–145)

## 2018-06-14 LAB — GLUCOSE, CAPILLARY
Glucose-Capillary: 143 mg/dL — ABNORMAL HIGH (ref 70–99)
Glucose-Capillary: 178 mg/dL — ABNORMAL HIGH (ref 70–99)
Glucose-Capillary: 135 mg/dL — ABNORMAL HIGH (ref 70–99)
Glucose-Capillary: 164 mg/dL — ABNORMAL HIGH (ref 70–99)

## 2018-06-14 LAB — MAGNESIUM: Magnesium: 2 mg/dL (ref 1.7–2.4)

## 2018-06-14 MED ORDER — BLOOD GLUCOSE MONITOR KIT: PACK | 0 refills | Status: DC

## 2018-06-14 MED ORDER — INSULIN ASPART 100 UNIT/ML ~~LOC~~ SOLN
0.0000 [IU] | Freq: Three times a day (TID) | SUBCUTANEOUS | Status: DC
  Administered 2018-06-14: 1 [IU] via SUBCUTANEOUS

## 2018-06-14 MED ORDER — METOPROLOL SUCCINATE ER 25 MG PO TB24
50.0000 mg | ORAL_TABLET | Freq: Every day | ORAL | Status: DC
  Administered 2018-06-14: 12:00:00 50 mg via ORAL
  Filled 2018-06-14: qty 2

## 2018-06-14 MED ORDER — RIVAROXABAN 20 MG PO TABS: 20.0000 mg | ORAL_TABLET | Freq: Every day | ORAL | Status: DC

## 2018-06-14 NOTE — Telephone Encounter (Signed)
Hosp f/u per Dr Truman Hayward pt appt 06/16 1015am/NW

## 2018-06-14 NOTE — Progress Notes (Signed)
Inpatient Diabetes Program Recommendations  AACE/ADA: New Consensus Statement on Inpatient Glycemic Control (2015)  Target Ranges:  Prepandial:   less than 140 mg/dL      Peak postprandial:   less than 180 mg/dL (1-2 hours)      Critically ill patients:  140 - 180 mg/dL   Lab Results  Component Value Date   GLUCAP 135 (H) 06/14/2018   HGBA1C 11.7 (H) 06/13/2018    Review of Glycemic Control Results for [name], [name] (MRN [id number]) as of 06/14/2018 15:14  Ref. Range 06/14/2018 05:59 06/14/2018 07:59 06/14/2018 12:24  Glucose-Capillary Latest Ref Range: 70 - 99 mg/dL 143 (H) 178 (H) 135 (H)   Diabetes history: Type 2 DM Outpatient Diabetes medications: Synjardy 05-998 mg BID Current orders for Inpatient glycemic control: Novolog 0-9 units TID  Inpatient Diabetes Program Recommendations:    Spoke with patient regarding outpatient diabetes management. Confirmed patient was taking medication listed above.  Reviewed patient's current A1c of 11.7%. Explained what a A1c is and what it measures. Also reviewed goal A1c with patient, importance of good glucose control @ home, and blood sugar goals. Reviewed patho of DM, need for insulin, role of pancreas, signs and symptoms of hyper vs hypo glycemia, vascular changes and comorbidites. Reviewed that patient may require insulin, discussed inpatient insulin requirements and discussed that patient was not eating well during hospitalization which may disguise need for insulin outpatient. At this time, patient is not willing to do insulin.  Patient will need a meter at discharge. Blood glucose meter (includes lancets and strips) ([id number]). Stressed the importance of checking CBGs 3 times per day. Reviewed when to check, how to check, target range, and when to call MD. Patient plans to begin keeping a log and take with her to next appointment. PCP visit scheduled. OP education placed.  Patient admits to drinking a lot of soda at home, 2-3 per day. Advised  patient the benefits of choosing foods and beverages that were of nutritional value and provided alternative options. Also, reviewed ADA recommendations for increasing activity. Patient states, "I need to get better."  Discussed outpatient plan with [facility] to include discussion of insulin at next appointment and to order meter.   Thanks, Bronson Curb, MSN, RNC-OB Diabetes Coordinator [phone] (8a-5p)

## 2018-06-14 NOTE — Progress Notes (Signed)
   Subjective:  [name] is a 48 y.o. F with PMH of HOCM, T2DM, A.fib, HTN admit for SBO on hospital day 1   [name] was examined at bedside this morning and states that she feels somewhat better today. She has had a bowel movement this morning, described as loose with some formed stools and is endorsing appetite. She denies severe abdominal pain, nausea, vomiting. She states that she noticed swelling in her submandibular this morning while brushing her teeth. She denies pain at the site.   Objective:  Vital signs in last 24 hours: Vitals:   06/13/18 1712 06/13/18 2126 06/13/18 2226 06/14/18 0418  BP: (!) 141/84 (!) 134/119 (!) 159/75 (!) 152/76  Pulse: 77 69 68 68  Resp: 18 18  18   Temp: 98.8 F (37.1 C) 98.6 F (37 C)  98.9 F (37.2 C)  TempSrc: Oral Oral  Oral  SpO2:  98%  98%  Weight: 81 kg     Height: 5' 5.98" (1.676 m)      Gen: Well-developed, well nourished, NAD Neck: Submandibular non-erythematous, non-tender swelling. supple, ROM intact, no cervical or axillary adenopathy CV: RRR, S1, S2 normal, 3/6 systolic murmur Pulm: CTAB, No rales, no wheezes Abd: Soft, hypoactive bowel sounds, mild suprapubic tenderness to palpation Extm: ROM intact, Peripheral pulses intact, No peripheral edema Skin: Dry, Warm, normal turgor, no rashes, lesions, wounds.  Neuro: AAOx3  Assessment/Plan:  Active Problems:   Atrial fibrillation (HCC)   Essential hypertension   Diabetes mellitus (HCC)   HOCM (hypertrophic obstructive cardiomyopathy) (HCC)   SBO (small bowel obstruction) (HCC)   Small bowel obstruction (HCC)  [name] is a 48 y.o. F with PMH of HOCM, T2DM, A.fib, HTN admit for SBO 2/2 history of abdominal surgery and most likely adhesions. Her symptoms appear to have significantly improved and is passing stool without difficulty. Case discussed with surgery team who agrees if she tolerates food today she would be ready for discharge. Will advance her diet today.   Small bowel obstruction: 1 loose BM last night. No further nausea, vomiting. CT abd: Distended small bowel loops @ transition point near ventral hernia mesh w/ focal sbo. - Appreciate surgery recs: no surgical indication, will need f/u w/ surgery outpatient for hernia mesh - Full liquid diet  Paroxismal A fib: In sinus rhythm today. She is on Xarelto, last dose was 2 days ago. She has no history of clots. - Lovenox instead of xarelto while Npo yesterday - Resume home xarelto, metoprolol  T2DM: At home on synjardi. Hyperglycemic to 330s on admission. - Glucose checks - SSI  Diet: Full liquids VTE ppx: Xarelto Code: FULL  Dispo: Anticipated discharge in approximately 0-1 day(s).   Mosetta Anis, MD 06/14/2018, 6:29 AM Pager: [phone]

## 2018-06-14 NOTE — Discharge Instructions (Signed)
Bowel Obstruction °A bowel obstruction means that something is blocking the small or large bowel. The bowel is also called the intestine. It is the long tube that connects the stomach to the opening of the butt (anus). When something blocks the bowel, food and fluids cannot pass through like normal. This condition needs to be treated. Treatment depends on the cause of the problem and how bad the problem is. °What are the causes? °Common causes of this condition include: °· Scar tissue (adhesions) from past surgery or from high-energy X-rays (radiation). °· Recent surgery in the belly. This affects how food moves in the bowel. °· Some diseases, such as: °? Irritation of the lining of the digestive tract (Crohn's disease). °? Irritation of small pouches in the bowel (diverticulitis). °· Growths or tumors. °· A bulging organ (hernia). °· Twisting of the bowel (volvulus). °· A foreign body. °· Slipping of a part of the bowel into another part (intussusception). °What are the signs or symptoms? °Symptoms of this condition include: °· Pain in the belly. °· Feeling sick to your stomach (nauseous). °· Throwing up (vomiting). °· Bloating in the belly. °· Being unable to pass gas. °· Trouble pooping (constipation). °· Watery poop (diarrhea). °· A lot of belching. °How is this diagnosed? °This condition may be diagnosed based on: °· A physical exam. °· Medical history. °· Imaging tests, such as X-ray or CT scan. °· Blood tests. °· Urine tests. °How is this treated? °Treatment for this condition may include: °· Fluids and pain medicines that are given through an IV tube. Your doctor may tell you not to eat or drink if you feel sick to your stomach and are throwing up. °· Eating a clear liquid diet for a few days. °· Putting a small tube (nasogastric tube) into the stomach. This will help with pain, discomfort, and nausea by removing blocked air and fluids from the stomach. °· Surgery. This may be needed if other treatments do  not work. °Follow these instructions at home: °Medicines °· Take over-the-counter and prescription medicines only as told by your doctor. °· If you were prescribed an antibiotic medicine, take it as told by your doctor. Do not stop taking the antibiotic even if you start to feel better. °General instructions °· Follow your diet as told by your doctor. You may need to: °? Only drink clear liquids until you start to get better. °? Avoid solid foods. °· Return to your normal activities as told by your doctor. Ask your doctor what activities are safe for you. °· Do not sit for a long time without moving. Get up to take short walks every 1-2 hours. This is important. Ask for help if you feel weak or unsteady. °· Keep all follow-up visits as told by your doctor. This is important. °How is this prevented? °After having a bowel obstruction, you may be more likely to have another. You can do some things to stop it from happening again. °· If you have a long-term (chronic) disease, contact your doctor if you see changes or problems. °· Take steps to prevent or treat trouble pooping. Your doctor may ask that you: °? Drink enough fluid to keep your pee (urine) pale yellow. °? Take over-the-counter or prescription medicines. °? Eat foods that are high in fiber. These include beans, whole grains, and fresh fruits and vegetables. °? Limit foods that are high in fat and sugar. These include fried or sweet foods. °· Stay active. Ask your doctor which exercises are   safe for you. °· Avoid stress. °· Eat three small meals and three small snacks each day. °· Work with a food expert (dietitian) to make a meal plan that works for you. °· Do not use any products that contain nicotine or tobacco, such as cigarettes and e-cigarettes. If you need help quitting, ask your doctor. °Contact a doctor if: °· You have a fever. °· You have chills. °Get help right away if: °· You have pain or cramps that get worse. °· You throw up blood. °· You are  sick to your stomach. °· You cannot stop throwing up. °· You cannot drink fluids. °· You feel mixed up (confused). °· You feel very thirsty (dehydrated). °· Your belly gets more bloated. °· You feel weak or you pass out (faint). °Summary °· A bowel obstruction means that something is blocking the small or large bowel. °· Treatment may include IV fluids and pain medicine. You may also have a clear liquid diet, a small tube in your stomach, or surgery. °· Drink clear liquids and avoid solid foods until you get better. °This information is not intended to replace advice given to you by your health care provider. Make sure you discuss any questions you have with your health care provider. °Document Released: 01/30/2004 Document Revised: 05/05/2017 Document Reviewed: 05/05/2017 °Elsevier Interactive Patient Education © 2019 [employer] ° °

## 2018-06-14 NOTE — Discharge Summary (Signed)
Name: [name] MRN: [id number] DOB: [birth year] 48 y.o. PCP: Welford Roche, MD  Date of Admission: 06/13/2018  1:49 AM Date of Discharge: 06/14/2018 Attending Physician: Lenice Pressman MD  Discharge Diagnosis: 1. Small Bowel Obstruction 2. Ventral Hernia s/p Repair  Discharge Medications: Allergies as of 06/14/2018   No Known Allergies     Medication List    STOP taking these medications   ibuprofen 200 MG tablet Commonly known as: ADVIL     TAKE these medications   blood glucose meter kit and supplies Kit Dispense based on patient and insurance preference. Use up to four times daily as directed. (FOR ICD-9 250.00, 250.01).   Empagliflozin-metFORMIN HCl 05-998 MG Tabs Commonly known as: Synjardy Take 1 tablet by mouth 2 (two) times daily with a meal.   metoprolol succinate 50 MG 24 hr tablet Commonly known as: TOPROL-XL TAKE 1 TABLET BY MOUTH DAILY. TAKE WITH OR IMMEDIATELY FOLLOWING A MEAL. What changed:   how much to take  how to take this  when to take this  additional instructions   rivaroxaban 20 MG Tabs tablet Commonly known as: XARELTO Take 1 tablet (20 mg total) by mouth daily with supper.   verapamil 180 MG CR tablet Commonly known as: CALAN-SR Take 1 tablet (180 mg total) by mouth daily.       Disposition and follow-up:   [name] was discharged from [facility] in Good condition.  At the hospital follow up visit please address:  1. Small Bowel Obstruction - Ensure she is having regular bowel movements and eating high fiber diet  2. Ventral Hernia s/p Repair  - Ensure she has f/u with surgery to re-evaluate her ventral hernia mesh   2.  Labs / imaging needed at time of follow-up: N/A  3.  Pending labs/ test needing follow-up: N/A  Follow-up Appointments: Follow-up Information    [place]. Go on 06/21/2018.   Why: 10:15 AM Contact information: [address] [place] [facility] Course by problem list: 1. Small Bowel Obstruction/ Ventral Hernia: [name] is a 48 y.o. F with PMH of HOCM, T2DM, A.fib, HTN presenting with abdominal pain. She was noted to have a hx of abdominal surgeries including a ventral hernia repair. She had a CT abd/pelvis which showed distended small bowel w/ transition around her ventral hernia mesh. Surgery was consulted who recommended bowel rest and ambulation. After admission she had a bowel movement the next day and she was able to tolerate full liquid diet without nausea or vomiting. She was discharged home per patient request w/ recommendation to f/u w/ outpatient surgery clinic.  Discharge Vitals:   BP (!) 151/70 (BP Location: Left Arm)   Pulse 69   Temp 98.7 F (37.1 C) (Oral)   Resp 16   Ht 5' 5.98" (1.676 m)   Wt 81 kg   LMP 10/01/2006   SpO2 99%   BMI 28.84 kg/m   Pertinent Labs, Studies, and Procedures:  CT ABDOMEN AND PELVIS WITH CONTRAST  TECHNIQUE: Multidetector CT imaging of the abdomen and pelvis was performed using the standard protocol following bolus administration of intravenous contrast.  CONTRAST:  164m OMNIPAQUE IOHEXOL 300 MG/ML  SOLN  COMPARISON:  CT of the abdomen and pelvis 12/24/2016  FINDINGS: Lower chest: Lung bases are clear without focal nodule, mass, or airspace disease. Calcifications are present at the mitral annulus. Heart size  is normal. No significant pleural or pericardial effusion is present.  Hepatobiliary: There is diffuse fatty infiltration of the liver. No discrete lesions are present. Cholecystectomy is noted. Common bile duct is within normal limits.  Pancreas: Unremarkable. No pancreatic ductal dilatation or surrounding inflammatory changes.  Spleen: Normal in size without focal abnormality.  Adrenals/Urinary Tract: Hands adrenal glands are normal bilaterally. Kidneys and ureters are within normal limits.  There is no stone or mass lesion. No obstruction is present.  Stomach/Bowel: The stomach and duodenum are within normal limits. Mildly distended loops of small bowel are noted in the lower abdomen and pelvis. The transition point is at the level of the calcified mesh repair associated with the patient's ventral hernia. There is some free fluid. No free air is present. No discrete abscess is present. The cecum and ascending colon are normal. Transverse colon is normal. Descending and sigmoid colon are normal.  Vascular/Lymphatic: No significant vascular findings are present. No enlarged abdominal or pelvic lymph nodes.  Reproductive: Uterus and bilateral adnexa are unremarkable.  Other: There is chronic calcification about the ventral hernia mesh repair. No new hernias are present. Free fluid is present.  Musculoskeletal: Vertebral body heights and alignment are maintained. Pelvis is normal. Hips are located and within normal limits.  IMPRESSION: 1. Distended loops of small bowel with transition point near the chronically calcified ventral hernia mesh repair. This likely reflects small bowel adhesions. Most proximal small bowel is not dilated. This represents an early focal small-bowel obstruction. 2. Inflammatory changes are associated with some free fluid. No free air or evidence for perforation is present. 3. Hepatic steatosis. 4. Cholecystectomy. 5. Stable appearance of calcified mesh repair for ventral hernia.  Discharge Instructions: Discharge Instructions    Ambulatory referral to Nutrition and Diabetic Education   Complete by: As directed    Call MD for:  difficulty breathing, headache or visual disturbances   Complete by: As directed    Call MD for:  extreme fatigue   Complete by: As directed    Call MD for:  persistant dizziness or light-headedness   Complete by: As directed    Call MD for:  persistant nausea and vomiting   Complete by: As directed    Call  MD for:  severe uncontrolled pain   Complete by: As directed    Diet - low sodium heart healthy   Complete by: As directed    Discharge instructions   Complete by: As directed    Dear [name]  You came to [place] with abdominal pain. We have determined this was caused by bowel obstruction. Here are our recommendations for you at discharge:  Make sure to exercise regularly Follow up with [facility] clinic on 06/21/18 at 10:15 AM Make sure to follow up with your surgeon to address your hernia mesh Please return to hospital if you have worsening abdominal pain, fevers or chills.  Thank you for choosing [facility].   Increase activity slowly   Complete by: As directed      Signed: Mosetta Anis, MD 06/17/2018, 2:37 PM   Pager: [phone]

## 2018-06-14 NOTE — Progress Notes (Signed)
[facility]/Trauma Progress Note      Assessment/Plan Hx of a fib on Xarelto HTN DM  Early SBO - CT showed Distended loops of small bowel with transition point near the chronically calcified ventral hernia mesh repair - mesh appears to be contracted and balled up which is likely causing intermittent SBO's - pt had flatus yesterday but non yet today, no N or V - at this point, no surgical intervention is needed but we will follow along  FEN: CLD and advance as tolerated  VTE: SCD's, Xarelto  ID: none Foley: none Follow up: TBD  DISPO: pt does not appear to be clinically obstructed. Okay to advance diet as tolerated.     LOS: 1 day    Subjective: CC: SBO  Very mild abdominal pain. No N or V overnight. Flatus yesterday but none yet today.   Objective: Vital signs in last 24 hours: Temp:  [98.1 F (36.7 C)-98.9 F (37.2 C)] 98.9 F (37.2 C) (06/09 0418) Pulse Rate:  [68-78] 68 (06/09 0418) Resp:  [18] 18 (06/09 0418) BP: (130-159)/(75-119) 152/76 (06/09 0418) SpO2:  [98 %] 98 % (06/09 0418) Weight:  [81 kg] 81 kg (06/08 1712) Last BM Date: 06/13/18  Intake/Output from previous day: 06/08 0701 - 06/09 0700 In: 235 [I.V.:235] Out: 300 [Urine:300] Intake/Output this shift: No intake/output data recorded.  PE: Gen:  Alert, NAD, pleasant, cooperative Pulm:  Rate and effort normal Abd: Soft, ND, +BS, periumbilical TTP without guarding, no peritonitis, Firmness noted around umbilicus likely related to mesh Skin: no rashes noted, warm and dry   Anti-infectives: Anti-infectives (From admission, onward)   None      Lab Results:  Recent Labs    06/13/18 0223 06/14/18 0318  WBC 12.7* 8.4  HGB 13.1 12.1  HCT 42.1 38.8  PLT 294 256   BMET Recent Labs    06/13/18 0223 06/14/18 0318  NA 136 140  K 4.4 3.9  CL 98 103  CO2 27 28  GLUCOSE 335* 137*  BUN 14 7  CREATININE 0.98 0.78  CALCIUM 9.4 9.2   PT/INR No results for input(s):  LABPROT, INR in the last 72 hours. CMP     Component Value Date/Time   NA 140 06/14/2018 0318   NA 137 01/14/2018 1625   K 3.9 06/14/2018 0318   CL 103 06/14/2018 0318   CO2 28 06/14/2018 0318   GLUCOSE 137 (H) 06/14/2018 0318   BUN 7 06/14/2018 0318   BUN 9 01/14/2018 1625   CREATININE 0.78 06/14/2018 0318   CALCIUM 9.2 06/14/2018 0318   PROT 8.2 (H) 06/13/2018 0223   ALBUMIN 4.0 06/13/2018 0223   AST 33 06/13/2018 0223   ALT 44 06/13/2018 0223   ALKPHOS 102 06/13/2018 0223   BILITOT 0.6 06/13/2018 0223   GFRNONAA >60 06/14/2018 0318   GFRAA >60 06/14/2018 0318   Lipase     Component Value Date/Time   LIPASE 31 06/13/2018 0223    Studies/Results: Ct Abdomen Pelvis W Contrast  Result Date: 06/13/2018 CLINICAL DATA:  Abdominal pain, acute, generalized. Abdominal distension and tightness. Diabetes. Ventral hernia repair. EXAM: CT ABDOMEN AND PELVIS WITH CONTRAST TECHNIQUE: Multidetector CT imaging of the abdomen and pelvis was performed using the standard protocol following bolus administration of intravenous contrast. CONTRAST:  146mL OMNIPAQUE IOHEXOL 300 MG/ML  SOLN COMPARISON:  CT of the abdomen and pelvis 12/24/2016 FINDINGS: Lower chest: Lung bases are clear without focal nodule, mass, or airspace disease. Calcifications are present at the  mitral annulus. Heart size is normal. No significant pleural or pericardial effusion is present. Hepatobiliary: There is diffuse fatty infiltration of the liver. No discrete lesions are present. Cholecystectomy is noted. Common bile duct is within normal limits. Pancreas: Unremarkable. No pancreatic ductal dilatation or surrounding inflammatory changes. Spleen: Normal in size without focal abnormality. Adrenals/Urinary Tract: Hands adrenal glands are normal bilaterally. Kidneys and ureters are within normal limits. There is no stone or mass lesion. No obstruction is present. Stomach/Bowel: The stomach and duodenum are within normal limits. Mildly  distended loops of small bowel are noted in the lower abdomen and pelvis. The transition point is at the level of the calcified mesh repair associated with the patient's ventral hernia. There is some free fluid. No free air is present. No discrete abscess is present. The cecum and ascending colon are normal. Transverse colon is normal. Descending and sigmoid colon are normal. Vascular/Lymphatic: No significant vascular findings are present. No enlarged abdominal or pelvic lymph nodes. Reproductive: Uterus and bilateral adnexa are unremarkable. Other: There is chronic calcification about the ventral hernia mesh repair. No new hernias are present. Free fluid is present. Musculoskeletal: Vertebral body heights and alignment are maintained. Pelvis is normal. Hips are located and within normal limits. IMPRESSION: 1. Distended loops of small bowel with transition point near the chronically calcified ventral hernia mesh repair. This likely reflects small bowel adhesions. Most proximal small bowel is not dilated. This represents an early focal small-bowel obstruction. 2. Inflammatory changes are associated with some free fluid. No free air or evidence for perforation is present. 3. Hepatic steatosis. 4. Cholecystectomy. 5. Stable appearance of calcified mesh repair for ventral hernia. Electronically Signed   By: Marin Robertshristopher  Mattern M.D.   On: 06/13/2018 06:55      Jerre SimonJessica L Michaelina Blandino , [facility] 06/14/2018, 10:09 AM  Pager: [phone] Mon-Wed, Friday 7:00am-4:30pm Thurs 7am-11:30am  Consults: [phone]

## 2018-06-16 NOTE — Telephone Encounter (Signed)
  Transition Care Management Follow-up Telephone Call  Date of discharge and from where: 06/14/18 from [facility]  How have you been since you were released from the hospital? "Very Good"  Any questions or concerns? No  Items Reviewed:  Did the pt receive and understand the discharge instructions provided? Yes  Medications obtained and verified? Yes, Pt also states she is not taking any ibuprofen as instructed in her discharge instructions.  Any new allergies since your discharge? No  Dietary orders reviewed? Pt states "I follow a diabetic diet"  Do you have support at home? Yes, "I have 2 children ages 19 & 79 who help me a lot"  Functional Questionnaire: (I = Independent and D = Dependent) ADLs: I  Bathing/Dressing- I  Meal Prep- I  Eating- I  Maintaining continence- I  Transferring/Ambulation- I  Managing Meds- I  Follow up appointments reviewed:   PCP Hospital f/u appt confirmed? Yes. Scheduled to see [facility]/Dr. Truman Hayward on 06/21/18 @ 1015.  [facility] f/u appt confirmed? N/A   Are transportation arrangements needed? No If their condition worsens, is the pt aware to call PCP or go to the Emergency Dept.? Yes, Pt instructed Call MD or present to ER for: difficulty breathing, headache or visual disturbances, extreme fatigue, persistant dizziness or light-headedness, persistant nausea and vomiting, and/or severe uncontrolled pain, pt verbalized understanding.  Was the patient provided with contact information for the PCP's office or ED? Yes  Was to pt encouraged to call back with questions or concerns? YES

## 2018-06-21 ENCOUNTER — Ambulatory Visit: Payer: BC Managed Care – PPO | Admitting: Internal Medicine

## 2018-06-21 ENCOUNTER — Other Ambulatory Visit: Payer: Self-pay

## 2018-06-21 ENCOUNTER — Encounter: Payer: Self-pay | Admitting: Internal Medicine

## 2018-06-21 VITALS — BP 169/81 | HR 72 | Temp 98.3°F | Ht 66.0 in | Wt 181.4 lb

## 2018-06-21 DIAGNOSIS — K56609 Unspecified intestinal obstruction, unspecified as to partial versus complete obstruction: Secondary | ICD-10-CM | POA: Diagnosis not present

## 2018-06-21 DIAGNOSIS — R011 Cardiac murmur, unspecified: Secondary | ICD-10-CM

## 2018-06-21 DIAGNOSIS — Z7984 Long term (current) use of oral hypoglycemic drugs: Secondary | ICD-10-CM | POA: Diagnosis not present

## 2018-06-21 DIAGNOSIS — E119 Type 2 diabetes mellitus without complications: Secondary | ICD-10-CM | POA: Diagnosis not present

## 2018-06-21 NOTE — Patient Instructions (Addendum)
Thank you for allowing [place] to care for you  For your small bowl obstruction - Continue with high fiber diet - Follow up with General Surgery : [phone]  For you diabetes - You only need to take your combination pill, Synjardy, twice a day  High-Fiber Diet Fiber, also called dietary fiber, is a type of carbohydrate that is found in fruits, vegetables, whole grains, and beans. A high-fiber diet can have many health benefits. Your health care provider may recommend a high-fiber diet to help:  Prevent constipation. Fiber can make your bowel movements more regular.  Lower your cholesterol.  Relieve the following conditions: ? Swelling of veins in the anus (hemorrhoids). ? Swelling and irritation (inflammation) of specific areas of the digestive tract (uncomplicated diverticulosis). ? A problem of the large intestine (colon) that sometimes causes pain and diarrhea (irritable bowel syndrome, IBS).  Prevent overeating as part of a weight-loss plan.  Prevent heart disease, type 2 diabetes, and certain cancers. What is my plan? The recommended daily fiber intake in grams (g) includes:  38 g for men age 67 or younger.  30 g for men over age 24.  28 g for women age 85 or younger.  21 g for women over age 59. You can get the recommended daily intake of dietary fiber by:  Eating a variety of fruits, vegetables, grains, and beans.  Taking a fiber supplement, if it is not possible to get enough fiber through your diet. What do I need to know about a high-fiber diet?  It is better to get fiber through food sources rather than from fiber supplements. There is not a lot of research about how effective supplements are.  Always check the fiber content on the nutrition facts label of any prepackaged food. Look for foods that contain 5 g of fiber or more per serving.  Talk with a diet and nutrition specialist (dietitian) if you have questions about specific foods that are recommended or not  recommended for your medical condition, especially if those foods are not listed below.  Gradually increase how much fiber you consume. If you increase your intake of dietary fiber too quickly, you may have bloating, cramping, or gas.  Drink plenty of water. Water helps you to digest fiber. What are tips for following this plan?  Eat a wide variety of high-fiber foods.  Make sure that half of the grains that you eat each day are whole grains.  Eat breads and cereals that are made with whole-grain flour instead of refined flour or white flour.  Eat brown rice, bulgur wheat, or millet instead of white rice.  Start the day with a breakfast that is high in fiber, such as a cereal that contains 5 g of fiber or more per serving.  Use beans in place of meat in soups, salads, and pasta dishes.  Eat high-fiber snacks, such as berries, raw vegetables, nuts, and popcorn.  Choose whole fruits and vegetables instead of processed forms like juice or sauce. What foods can I eat?  Fruits Berries. Pears. Apples. Oranges. Avocado. Prunes and raisins. Dried figs. Vegetables Sweet potatoes. Spinach. Kale. Artichokes. Cabbage. Broccoli. Cauliflower. Green peas. Carrots. Squash. Grains Whole-grain breads. Multigrain cereal. Oats and oatmeal. Brown rice. Barley. Bulgur wheat. Hermann. Quinoa. Bran muffins. Popcorn. Rye wafer crackers. Meats and other proteins Navy, kidney, and pinto beans. Soybeans. Split peas. Lentils. Nuts and seeds. Dairy Fiber-fortified yogurt. Beverages Fiber-fortified soy milk. Fiber-fortified orange juice. Other foods Fiber bars. The items listed above may  not be a complete list of recommended foods and beverages. Contact a dietitian for more options. What foods are not recommended? Fruits Fruit juice. Cooked, strained fruit. Vegetables Fried potatoes. Canned vegetables. Well-cooked vegetables. Grains White bread. Pasta made with refined flour. White rice. Meats and  other proteins Fatty cuts of meat. Fried chicken or fried fish. Dairy Milk. Yogurt. Cream cheese. Sour cream. Fats and oils Butters. Beverages Soft drinks. Other foods Cakes and pastries. The items listed above may not be a complete list of foods and beverages to avoid. Contact a dietitian for more information. Summary  Fiber is a type of carbohydrate. It is found in fruits, vegetables, whole grains, and beans.  There are many health benefits of eating a high-fiber diet, such as preventing constipation, lowering blood cholesterol, helping with weight loss, and reducing your risk of heart disease, diabetes, and certain cancers.  Gradually increase your intake of fiber. Increasing too fast can result in cramping, bloating, and gas. Drink plenty of water while you increase your fiber.  The best sources of fiber include whole fruits and vegetables, whole grains, nuts, seeds, and beans. This information is not intended to replace advice given to you by your health care provider. Make sure you discuss any questions you have with your health care provider. Document Released: 12/22/2004 Document Revised: 10/26/2016 Document Reviewed: 10/26/2016 Elsevier Interactive Patient Education  2019 [employer]

## 2018-06-21 NOTE — Progress Notes (Signed)
   CC: Hospital follow up, Diabetes  HPI:  [name] is a 48 y.o. F with PMHx listed below presenting for Hospital follow up, Diabetes. Please see the A&P for the status of the patient's chronic medical problems.  Past Medical History:  Diagnosis Date  . Bell's palsy 03/2017  . Gallstones   . Gestational diabetes mellitus (GDM) 2005; 2006  . Headache    "monthly" (03/12/2017)  . History of chest pain   . History of prolonged Q-T interval on ECG    a. 09/2012 adm: QTC 506.  [place] Kitchen Hypertension   . PAF (paroxysmal atrial fibrillation) ([place]) 2012   a. 07/2011 ED visit with AF-> converted with flecainide, never followed-up;  b. 05/2012 Recurrent Afib -echo: EF 65-60%, mildly dil LA. Placed on Xarelto/daily Flecainide. c. 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  . Small bowel obstruction ([place]) 09/2010   resolved  . Type II diabetes mellitus ([place])    a. 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 b. 05/2012 A1c 12.6.   Review of Systems:  Performed and all others negative.  Physical Exam:  Vitals:   06/21/18 1032  BP: (!) 169/81  Pulse: 72  Temp: 98.3 F (36.8 C)  TempSrc: Oral  SpO2: 100%  Weight: 181 lb 6.4 oz (82.3 kg)  Height: 5\' 6"  (1.676 m)   Physical Exam Constitutional:      General: She is not in acute distress.    Appearance: Normal appearance.  Cardiovascular:     Rate and Rhythm: Normal rate and regular rhythm.     Pulses: Normal pulses.     Heart sounds: Murmur present.  Pulmonary:     Effort: Pulmonary effort is normal. No respiratory distress.     Breath sounds: Normal breath sounds.  Abdominal:     General: Bowel sounds are normal. There is no distension.     Palpations: Abdomen is soft.     Tenderness: There is no abdominal tenderness.  Musculoskeletal:        General: No swelling or deformity.  Skin:    General: Skin is warm and dry.  Neurological:     General: No focal deficit present.     Mental Status: Mental status is  at baseline.     Assessment & Plan:   See Encounters Tab for problem based charting.  Patient discussed with Dr. Angelia Mould

## 2018-06-21 NOTE — Assessment & Plan Note (Signed)
Patient regimen clarified with her today. She has been started on Synjardy 5mg -1000mg  BID (instead of the two meds separate) - Continue Synjardy (Empagliflozin-Metformin) 5mg -1000mg  BID

## 2018-06-21 NOTE — Assessment & Plan Note (Addendum)
Patient presents for follow up after admission for SBO. She presented on 6/8 with abdominal pain. She has history of Abdominal surgery and hernia repair. CT abd/pelvis was done and showed distended small bowel w/ transition around her ventral hernia mesh. She was seen by surgery while admitted. He SBO resolve with just rest and abulation. She has a BM before  Surgery was consulted who recommended bowel rest and ambulation. After admission she had a bowel movement the next day and she was able to tolerate full liquid diet without nausea or vomiting.  Today patient continues to feel well and has been able to have regular bowl movements. She was unsure if she had follow up with General surgery regarding her surgical mesh and SBO so a number for their office was provided. She is trying to maintain a high fiber diet. - Follow up with general surgery PRN - High fiber diet (information provided in AVS)

## 2018-06-27 NOTE — Progress Notes (Signed)
Internal Medicine Clinic Attending  Case discussed with Dr. Melvin  at the time of the visit.  We reviewed the resident's history and exam and pertinent patient test results.  I agree with the assessment, diagnosis, and plan of care documented in the resident's note.  

## 2018-07-20 ENCOUNTER — Ambulatory Visit: Payer: BC Managed Care – PPO | Admitting: Registered"

## 2018-08-01 MED FILL — VERAPAMIL ER 180 MG TABLET: 180 | 90 days supply | Qty: 90 | Fill #0

## 2018-08-01 MED FILL — SYNJARDY 5-1,000 MG TABLET: 5-1000 | 30 days supply | Qty: 60 | Fill #0

## 2018-08-01 MED FILL — XARELTO 20 MG TABLET: 20 | 30 days supply | Qty: 30 | Fill #0

## 2018-08-01 MED FILL — METOPROLOL SUCCINATE ER 50: 50 | 30 days supply | Qty: 30 | Fill #0

## 2018-08-18 ENCOUNTER — Encounter: Payer: Self-pay | Admitting: Registered"

## 2018-08-18 ENCOUNTER — Encounter: Payer: BC Managed Care – PPO | Attending: "Endocrinology | Admitting: Registered"

## 2018-08-18 ENCOUNTER — Other Ambulatory Visit: Payer: Self-pay

## 2018-08-18 DIAGNOSIS — E1165 Type 2 diabetes mellitus with hyperglycemia: Secondary | ICD-10-CM | POA: Insufficient documentation

## 2018-08-18 NOTE — Progress Notes (Signed)
Diabetes Self-Management Education  Visit Type: First/Initial  Appt. Start Time: 1530 Appt. End Time: 1625  08/18/2018  [name], identified by name and date of birth, is a 48 y.o. female with a diagnosis of Diabetes: Type 2.   ASSESSMENT  Last menstrual period 10/01/2006. There is no height or weight on file to calculate BMI.   Patient appears to have some language barrier, appeared to have some difficulty filling out the initial paperwork at check-in and during visit did not know the name of specific nuts. RD asked if she wanted an interpreter, but she declined.  Hospital referral 06/13/18 A1c 11.7% from encounter when pt states she went to hospital d/t chest pain. Subsequent PCP visit her DM medication changed from metformin to synjardy.  Pt denies SMBG d/t meter not working properly. Last time she checked BG was in June.   Hx GDM with 2 pregnancies, pt states she was in one GDM class 2007, but doesn't remember much about it.  Other complaints: pt states she had medication for pain in her foot, but when she went to family medicine visit it was discontinued for unknown reason.  Physical activity: active at work, no structured activity. 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  Sleep: Pt states she gets plenty of sleep, usually 8 hrs.  Stress: pt reports low stress. Pt states she just lives with her 2 children.   Diabetes Self-Management Education - 08/18/18 1538      Visit Information   Visit Type  First/Initial      Initial Visit   Diabetes Type  Type 2    Are you currently following a meal plan?  No    Are you taking your medications as prescribed?  Yes   synjardia   Date Diagnosed  ~2018      Health Coping   How would you rate your overall health?  Good      Psychosocial Assessment   Patient Belief/Attitude about Diabetes  Motivated to manage diabetes    Self-care barriers  English as a second language    How often do you need to have someone  help you when you read instructions, pamphlets, or other written materials from your doctor or pharmacy?  5 - Always    What is the last grade level you completed in school?  10      Complications   Last HgB A1C per patient/outside source  11.7 %    How often do you check your blood sugar?  0 times/day (not testing)   meter not working   Have you had a dilated eye exam in the past 12 months?  Yes    Have you had a dental exam in the past 12 months?  Yes    Are you checking your feet?  Yes    How many days per week are you checking your feet?  7      Dietary Intake   Breakfast  eggs, wheat bread, ~16 oz OJ    Snack (morning)  1 candy, nuts    Lunch  rice, beans, chicken or [place]urkey, water or reg soda    Snack (afternoon)  none OR ice cream    Dinner  bread with mayo    Snack (evening)  sugar wafers    Beverage(s)  Water, OJ, soda, cranberry juice      Exercise   Exercise Type  ADL's    How many days per week to you exercise?  0  How many minutes per day do you exercise?  0    Total minutes per week of exercise  0      Patient Education   Previous Diabetes Education  Yes (please comment)   GDM 2007   Nutrition management   Role of diet in the treatment of diabetes and the relationship between the three main macronutrients and blood glucose level    Monitoring  Identified appropriate SMBG and/or A1C goals.      Individualized Goals (developed by patient)   Nutrition  General guidelines for healthy choices and portions discussed    Monitoring   test my blood glucose as discussed      Outcomes   Expected Outcomes  Demonstrated interest in learning. Expect positive outcomes    Future DMSE  4-6 wks    Program Status  Not Completed       Individualized Plan for Diabetes Self-Management Training:   Learning Objective:  Patient will have a greater understanding of diabetes self-management. Patient education plan is to attend individual and/or group sessions per assessed needs and  concerns.   Patient Instructions  Take your glucose meter to your pharmacy to see if they can help you with it. Consider checking your blood sugar 2 hours after a meal to see how it affected your blood sugar Use MyPlate to help you eat the right amount of carbohydrates and balance them with protein and non-starchy vegetables. You have made goal to give up soda and juice.  Expected Outcomes:  Demonstrated interest in learning. Expect positive outcomes  Education material provided: A1C conversion sheet, Let's Count Carbs, Planning healthy Meals (novonordisk foldout)  If problems or questions, patient to contact team via:  Phone  Future DSME appointment: 4-6 wks

## 2018-08-18 NOTE — Patient Instructions (Addendum)
Take your glucose meter to your pharmacy to see if they can help you with it. Consider checking your blood sugar 2 hours after a meal to see how it affected your blood sugar Use MyPlate to help you eat the right amount of carbohydrates and balance them with protein and non-starchy vegetables. You have made goal to give up soda and juice.

## 2018-09-29 ENCOUNTER — Ambulatory Visit: Payer: BC Managed Care – PPO | Admitting: Registered"

## 2018-10-18 ENCOUNTER — Telehealth: Payer: Self-pay

## 2018-10-18 MED FILL — XARELTO 20 MG TABLET: 20 | 30 days supply | Qty: 30 | Fill #1

## 2018-10-18 MED FILL — METOPROLOL SUCCINATE ER 50: 50 | 30 days supply | Qty: 30 | Fill #1

## 2018-10-18 MED FILL — SYNJARDY 5-1,000 MG TABLET: 5-1000 | 30 days supply | Qty: 60 | Fill #1

## 2018-10-18 NOTE — Progress Notes (Signed)
Virtual Visit via Telephone Note   This visit type was conducted due to national recommendations for restrictions regarding the COVID-19 Pandemic (e.g. social distancing) in an effort to limit this patient's exposure and mitigate transmission in our community.  Due to her co-morbid illnesses, this patient is at least at moderate risk for complications without adequate follow up.  This format is felt to be most appropriate for this patient at this time.  The patient did not have access to video technology/had technical difficulties with video requiring transitioning to audio format only (telephone).  All issues noted in this document were discussed and addressed.  No physical exam could be performed with this format.  Please refer to the patient's chart for her  consent to telehealth for [facility].   Date:  10/19/2018   ID:  [name], DOB 05-11-1970, MRN [id number]  Patient Location: Home Provider Location: Home  PCP:  Welford Roche, MD  Cardiologist: Dr. Johnsie Cancel  Evaluation Performed:  Follow-Up Visit  Chief Complaint:  15 months follow up  History of Present Illness:    [name] is a 48 y.o. female with hx of PAF, HOCM, HTN, non compliance due to cost, bell's palsy, and DM seen for follow up.  Hx of Paroxysmal atrial fibrillation dating back to 2013. she treated with flecainide and Xarelto for chadsvas score of 3.   Had Cholecystectomy 03/2017. Seen by cardiology for palpitations. Tele showed sinus tachycardia/PAT. Echo suggested HOCM with resting gradient 29 mmHg mild to moderate MR.  Last seen by Dr. Johnsie Cancel 04/2017. Continued varapamil and toprol for HOCM.   Patient reports compliance with her medications.  She has blood in her mouth when she walks up initially.  Denies bleeding gums.  Does not think she is coughing up.  Just noted blood in her mouth.  Very hard to get proper history over phone.  She denies alcohol drinking or tobacco smoking.  No blood in  her stool or urine.  Denies chest pain, shortness of breath, orthopnea, PND or syncope.  Intermittent fluttering sensation.   The patient does not have symptoms concerning for COVID-19 infection (fever, chills, cough, or new shortness of breath).    Past Medical History:  Diagnosis Date  . Bell's palsy 03/2017  . Gallstones   . Gestational diabetes mellitus (GDM) 2005; 2006  . Headache    "monthly" (03/12/2017)  . History of chest pain   . History of prolonged Q-T interval on ECG    a. 09/2012 adm: QTC 506.  Marland Kitchen Hypertension   . PAF (paroxysmal atrial fibrillation) ([place]) 2012   a. 07/2011 ED visit with AF-> converted with flecainide, never followed-up;  b. 05/2012 Recurrent Afib -echo: EF 65-60%, mildly dil LA. Placed on Xarelto/daily Flecainide. c. 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  . Small bowel obstruction ([place]) 09/2010   resolved  . Type II diabetes mellitus ([place])    a. 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 b. 05/2012 A1c 12.6.   Past Surgical History:  Procedure Laterality Date  . CESAREAN SECTION  2005; 2006  . CHOLECYSTECTOMY N/A 03/25/2017   Procedure: LAPAROSCOPIC CHOLECYSTECTOMY;  Surgeon: Kinsinger, Arta Bruce, MD;  Location: [facility];  Service: General;  Laterality: N/A;  . HERNIA REPAIR    . VENTRAL HERNIA REPAIR  02/05/2005   with mesh     Current Meds  Medication Sig  . blood glucose meter kit and supplies KIT Dispense based on patient  and insurance preference. Use up to four times daily as directed. (FOR ICD-9 250.00, 250.01).  . Empagliflozin-metFORMIN HCl (SYNJARDY) 05-998 MG TABS Take 1 tablet by mouth 2 (two) times daily with a meal.  . metoprolol succinate (TOPROL-XL) 50 MG 24 hr tablet TAKE 1 TABLET BY MOUTH DAILY. TAKE WITH OR IMMEDIATELY FOLLOWING A MEAL. (Patient taking differently: Take 50 mg by mouth daily. )  . rivaroxaban (XARELTO) 20 MG TABS tablet Take 1 tablet (20 mg total) by mouth daily with supper.   . verapamil (CALAN-SR) 180 MG CR tablet Take 1 tablet (180 mg total) by mouth daily.     Allergies:   Patient has no known allergies.   Social History   Tobacco Use  . Smoking status: Never Smoker  . Smokeless tobacco: Never Used  Substance Use Topics  . Alcohol use: Never    Frequency: Never  . Drug use: Never     Family Hx: The patient's family history includes Asthma in her mother; Other in her father, sister, and sister. There is no history of Heart disease.  ROS:   Please see the history of present illness.    All other systems reviewed and are negative.   Prior CV studies:   The following studies were reviewed today:  Echo 03/2017 - Left ventricle: The cavity size was normal. Wall thickness was   increased in a pattern of moderate LVH. There was severe focal   basal hypertrophy of the septum. SAM is noted. Rest LVOT gradient   of 29 mmHg - suggestive of HOCM. The estimated ejection fraction   was 75%. Doppler parameters are consistent with abnormal left   ventricular relaxation (grade 1 diastolic dysfunction). The E/e&'   ratio is >15, suggesting elevated LV filling pressure. LVOT peak   gradient (S): 29 mm Hg. - Mitral valve: Thickened leaflet tips. Mild to moderate   regurgitation. Calcified annulus. - Left atrium: The atrium was normal in size. - Inferior vena cava: The IVC measures <1.2 cm and collapses   spontaneously, suggesting volume depletion and a low RA pressure   of 3 mmHg.  Impressions:  - Compared to a recent study, the quality of this study is   improved. There are features are now more consistent with HOCM -   including systolic anterior motion of the mitral valve (SAM),   elevated LV filling pressure and a rest LVOT gradient of 29 mmHg.   There is severe focal basal septal hypertrophy. There is   resultant mild to moderate MR, due to [facility] as well as mitral   leaflet tip thickening and posterior mitral annular   calcification. The LV is  hyperdynamic with LVEF 75%. The IVC is   small, suggesting volume depletion may be a contributing factor.  Labs/Other Tests and Data Reviewed:    EKG:  No ECG reviewed.  Recent Labs: 06/13/2018: ALT 44 06/14/2018: BUN 7; Creatinine, Ser 0.78; Hemoglobin 12.1; Magnesium 2.0; Platelets 256; Potassium 3.9; Sodium 140   Recent Lipid Panel Lab Results  Component Value Date/Time   CHOL 135 04/03/2018 07:39 PM   CHOL 109 01/15/2017 04:45 PM   TRIG 266 (H) 04/03/2018 07:39 PM   HDL 56 04/03/2018 07:39 PM   HDL 51 01/15/2017 04:45 PM   CHOLHDL 2.4 04/03/2018 07:39 PM   LDLCALC 26 04/03/2018 07:39 PM   LDLCALC 17 01/15/2017 04:45 PM    Wt Readings from Last 3 Encounters:  10/19/18 181 lb (82.1 kg)  06/21/18 181 lb 6.4 oz (82.3  kg)  06/13/18 178 lb 9.2 oz (81 kg)     Objective:    Vital Signs:  Ht 5' 6"  (1.676 m)   Wt 181 lb (82.1 kg)   LMP 10/01/2006   BMI 29.21 kg/m    VITAL SIGNS:  reviewed GEN:  no acute distress PSYCH:  normal affect  ASSESSMENT & PLAN:    1. HOCM -No syncope.  Intermittent dizziness and fluttering sensation.  She will need office visit for further evaluation.  2. PAF -Intermittent fluttering sensation.  Noted blood in her mouth when she walks up but not coughing up. Denies tobacco smoking or prior lung issue.  Hemoglobin was stable in June.  Will need office visit for further evaluation.  Questionable need of chest x-ray.  3. HTN -She does not have blood pressure cuff at home.  Continue beta-blocker and verapamil.  She likes to come to our office for further evaluation rather than PCP eval.   COVID-19 Education: The signs and symptoms of COVID-19 were discussed with the patient and how to seek care for testing (follow up with PCP or arrange E-visit).  The importance of social distancing was discussed today.  Time:  Today, I have spent 10 minutes with the patient with telehealth technology discussing the above problems.     Medication  Adjustments/Labs and Tests Ordered: Current medicines are reviewed at length with the patient today.  Concerns regarding medicines are outlined above.   Tests Ordered: No orders of the defined types were placed in this encounter.   Medication Changes: No orders of the defined types were placed in this encounter.   Follow Up:  In Person in 2 week(s)  Signed, Leanor Kail, [place]  10/19/2018 10:27 AM    [facility]

## 2018-10-18 NOTE — Telephone Encounter (Signed)
Virtual Visit Pre-Appointment Phone Call  "(Name), I am calling you today to discuss your upcoming appointment. We are currently trying to limit exposure to the virus that causes COVID-19 by seeing patients at home rather than in the office."  1. "What is the BEST phone number to call the day of the visit?" - include this in appointment notes  2. "Do you have or have access to (through a family member/friend) a smartphone with video capability that we can use for your visit?" a. If yes - list this number in appt notes as "cell" (if different from BEST phone #) and list the appointment type as a VIDEO visit in appointment notes b. If no - list the appointment type as a PHONE visit in appointment notes  3. Confirm consent - "In the setting of the current Covid19 crisis, you are scheduled for a (phone or video) visit with your provider on (date) at (time).  Just as we do with many in-office visits, in order for you to participate in this visit, we must obtain consent.  If you'd like, I can send this to your mychart (if signed up) or email for you to review.  Otherwise, I can obtain your verbal consent now.  All virtual visits are billed to your insurance company just like a normal visit would be.  By agreeing to a virtual visit, we'd like you to understand that the technology does not allow for your provider to perform an examination, and thus may limit your provider's ability to fully assess your condition. If your provider identifies any concerns that need to be evaluated in person, we will make arrangements to do so.  Finally, though the technology is pretty good, we cannot assure that it will always work on either your or our end, and in the setting of a video visit, we may have to convert it to a phone-only visit.  In either situation, we cannot ensure that we have a secure connection.  Are you willing to proceed?" STAFF: Did the patient verbally acknowledge consent to telehealth visit? Document  YES/NO here: yes  4. Advise patient to be prepared - "Two hours prior to your appointment, go ahead and check your blood pressure, pulse, oxygen saturation, and your weight (if you have the equipment to check those) and write them all down. When your visit starts, your provider will ask you for this information. If you have an Apple Watch or Kardia device, please plan to have heart rate information ready on the day of your appointment. Please have a pen and paper handy nearby the day of the visit as well."  5. Give patient instructions for MyChart download to smartphone OR [email or web address] as below if video visit (depending on what platform provider is using)  6. Inform patient they will receive a phone call 15 minutes prior to their appointment time (may be from unknown caller ID) so they should be prepared to answer    TELEPHONE CALL NOTE  [name] has been deemed a candidate for a follow-up tele-health visit to limit community exposure during the Covid-19 pandemic. I spoke with the patient via phone to ensure availability of phone/video source, confirm preferred email & phone number, and discuss instructions and expectations.  I reminded [name] to be prepared with any vital sign and/or heart rhythm information that could potentially be obtained via home monitoring, at the time of her visit. I reminded [name] to expect a phone call prior to  her visit.  Clide Dales Tecumseh Yeagley, CMA 10/18/2018 2:48 PM   INSTRUCTIONS FOR DOWNLOADING THE MYCHART APP TO SMARTPHONE  - The patient must first make sure to have activated MyChart and know their login information - If Apple, go to [employer] and type in MyChart in the search bar and download the app. If Android, ask patient to go to [employer] and type in [place] in the search bar and download the app. The app is free but as with any other app downloads, their phone may require them to verify saved payment information or  Apple/Android password.  - The patient will need to then log into the app with their MyChart username and password, and select [facility] as their healthcare provider to link the account. When it is time for your visit, go to the MyChart app, find appointments, and click Begin Video Visit. Be sure to Select Allow for your device to access the Microphone and Camera for your visit. You will then be connected, and your provider will be with you shortly.  **If they have any issues connecting, or need assistance please contact MyChart service desk [phone])**  **If using a computer, in order to ensure the best quality for their visit they will need to use either of the following Internet Browsers: [employer] or Google Chrome**  IF USING DOXIMITY or DOXY.ME - The patient will receive a link just prior to their visit by text.     FULL LENGTH CONSENT FOR TELE-HEALTH VISIT   I hereby voluntarily request, consent and authorize [facility] and its employed or contracted physicians, physician assistants, nurse practitioners or other licensed health care professionals (the Practitioner), to provide me with telemedicine health care services (the "Services") as deemed necessary by the treating Practitioner. I acknowledge and consent to receive the Services by the Practitioner via telemedicine. I understand that the telemedicine visit will involve communicating with the Practitioner through live audiovisual communication technology and the disclosure of certain medical information by electronic transmission. I acknowledge that I have been given the opportunity to request an in-person assessment or other available alternative prior to the telemedicine visit and am voluntarily participating in the telemedicine visit.  I understand that I have the right to withhold or withdraw my consent to the use of telemedicine in the course of my care at any time, without affecting my right to future care  or treatment, and that the Practitioner or I may terminate the telemedicine visit at any time. I understand that I have the right to inspect all information obtained and/or recorded in the course of the telemedicine visit and may receive copies of available information for a reasonable fee.  I understand that some of the potential risks of receiving the Services via telemedicine include:  Marland Kitchen Delay or interruption in medical evaluation due to technological equipment failure or disruption; . Information transmitted may not be sufficient (e.g. poor resolution of images) to allow for appropriate medical decision making by the Practitioner; and/or  . In rare instances, security protocols could fail, causing a breach of personal health information.  Furthermore, I acknowledge that it is my responsibility to provide information about my medical history, conditions and care that is complete and accurate to the best of my ability. I acknowledge that Practitioner's advice, recommendations, and/or decision may be based on factors not within their control, such as incomplete or inaccurate data provided by me or distortions of diagnostic images or specimens that may result from electronic transmissions.  I understand that the practice of medicine is not an exact science and that Practitioner makes no warranties or guarantees regarding treatment outcomes. I acknowledge that I will receive a copy of this consent concurrently upon execution via email to the email address I last provided but may also request a printed copy by calling the office of [facility].    I understand that my insurance will be billed for this visit.   I have read or had this consent read to me. . I understand the contents of this consent, which adequately explains the benefits and risks of the Services being provided via telemedicine.  . I have been provided ample opportunity to ask questions regarding this consent and the Services and have had  my questions answered to my satisfaction. . I give my informed consent for the services to be provided through the use of telemedicine in my medical care  By participating in this telemedicine visit I agree to the above.

## 2018-10-19 ENCOUNTER — Telehealth (INDEPENDENT_AMBULATORY_CARE_PROVIDER_SITE_OTHER): Payer: BC Managed Care – PPO | Admitting: Physician Assistant

## 2018-10-19 ENCOUNTER — Encounter: Payer: Self-pay | Admitting: Physician Assistant

## 2018-10-19 ENCOUNTER — Other Ambulatory Visit: Payer: Self-pay

## 2018-10-19 VITALS — Ht 66.0 in | Wt 181.0 lb

## 2018-10-19 DIAGNOSIS — I480 Paroxysmal atrial fibrillation: Secondary | ICD-10-CM

## 2018-10-19 DIAGNOSIS — I421 Obstructive hypertrophic cardiomyopathy: Secondary | ICD-10-CM | POA: Diagnosis not present

## 2018-10-19 DIAGNOSIS — I10 Essential (primary) hypertension: Secondary | ICD-10-CM

## 2018-10-19 NOTE — Patient Instructions (Signed)
Medication Instructions:  Your physician recommends that you continue on your current medications as directed. Please refer to the Current Medication list given to you today.  If you need a refill on your cardiac medications before your next appointment, please call your pharmacy.   Lab work: None   If you have labs (blood work) drawn today and your tests are completely normal, you will receive your results only by: Marland Kitchen MyChart Message (if you have MyChart) OR . A paper copy in the mail If you have any lab test that is abnormal or we need to change your treatment, we will call you to review the results.  Testing/Procedures: None   Follow-Up: You are scheduled to see Truitt Merle, NP on 11/08/2018 @ 9:30 AM  Any Other Special Instructions Will Be Listed Below (If Applicable).

## 2018-11-03 NOTE — Progress Notes (Signed)
CARDIOLOGY OFFICE NOTE  Date:  11/08/2018    [name] Date of Birth: 1970-08-31 Medical Record #[id number]  PCP:  Welford Roche, MD  Cardiologist:  Johnsie Cancel  Chief Complaint  Patient presents with  . Follow-up    History of Present Illness: [name] is a 48 y.o. female who presents today for a follow up visit. Seen for Dr. Johnsie Cancel.   She has a history of PAF since 2013 - treated with Flecainide and Xarelto, HOCM, HTN, non compliance due to cost, Bell's palsy, and DM.  She had cholecystectomy in 2019 and was seen by cardiology for palpitations. Telemetry showed sinus tachycardia/PAT. Echo suggested HOCM with resting gradient 29 mmHg mild to moderate MR.  Last seen by Dr. Johnsie Cancel 04/2017.   She had a telehealth visit with Vin back in October - noted some blood in her mouth when waking up initially and some palpitations. Otherwise, felt to be doing ok. In office visit felt to be needed.   The patient does not have symptoms concerning for COVID-19 infection (fever, chills, cough, or new shortness of breath).   Comes in today. Here alone. Several concerns noted today. BP is pretty high. Getting too much salt. Lots of take out food endorsed. Says she did not realize that had salt. She is on chronic Xarelto - in the mornings she notes that her "spit" is red. She has stopped her Xarelto on her own a few times and this would improved - would recur when she restarted it. Does not seem like a large amount - she is not able to quantify. She notes a little fluttering in her chest - hard for her to describe. No syncope. Has sharp pain at times for a few minutes - gets better with drinking water.   Past Medical History:  Diagnosis Date  . Bell's palsy 03/2017  . Gallstones   . Gestational diabetes mellitus (GDM) 2005; 2006  . Headache    "monthly" (03/12/2017)  . History of chest pain   . History of prolonged Q-T interval on ECG    a. 09/2012 adm: QTC 506.  Marland Kitchen  Hypertension   . PAF (paroxysmal atrial fibrillation) ([place]) 2012   a. 07/2011 ED visit with AF-> converted with flecainide, never followed-up;  b. 05/2012 Recurrent Afib -echo: EF 65-60%, mildly dil LA. Placed on Xarelto/daily Flecainide. c. 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  . Small bowel obstruction ([place]) 09/2010   resolved  . Type II diabetes mellitus ([place])    a. 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 b. 05/2012 A1c 12.6.    Past Surgical History:  Procedure Laterality Date  . CESAREAN SECTION  2005; 2006  . CHOLECYSTECTOMY N/A 03/25/2017   Procedure: LAPAROSCOPIC CHOLECYSTECTOMY;  Surgeon: Kinsinger, Arta Bruce, MD;  [facility];  Service: General;  Laterality: N/A;  . HERNIA REPAIR    . VENTRAL HERNIA REPAIR  02/05/2005   with mesh     Medications: Current Meds  Medication Sig  . blood glucose meter kit and supplies KIT Dispense based on patient and insurance preference. Use up to four times daily as directed. (FOR ICD-9 250.00, 250.01).  . Empagliflozin-metFORMIN HCl (SYNJARDY) 05-998 MG TABS Take 1 tablet by mouth 2 (two) times daily with a meal.  . metoprolol succinate (TOPROL-XL) 50 MG 24 hr tablet TAKE 1 TABLET BY MOUTH DAILY. TAKE WITH OR IMMEDIATELY FOLLOWING A MEAL. (Patient taking differently: Take 50 mg by  mouth daily. )  . rivaroxaban (XARELTO) 20 MG TABS tablet Take 1 tablet (20 mg total) by mouth daily with supper.  . [DISCONTINUED] verapamil (CALAN-SR) 180 MG CR tablet Take 1 tablet (180 mg total) by mouth daily.     Allergies: No Known Allergies  Social History: The patient  reports that she has never smoked. She has never used smokeless tobacco. She reports that she does not drink alcohol or use drugs.   Family History: The patient's family history includes Asthma in her mother; Other in her father, sister, and sister.   Review of Systems: Please see the history of present illness.   All other  systems are reviewed and negative.   Physical Exam: VS:  BP (!) 168/90 (BP Location: Left Arm, Patient Position: Sitting, Cuff Size: Normal)   Pulse 79   Ht 5' 6" (1.676 m)   Wt 179 lb 1.9 oz (81.2 kg)   LMP 10/01/2006   SpO2 98% Comment: at rest  BMI 28.91 kg/m  .  BMI Body mass index is 28.91 kg/m.  Wt Readings from Last 3 Encounters:  11/08/18 179 lb 1.9 oz (81.2 kg)  10/19/18 181 lb (82.1 kg)  06/21/18 181 lb 6.4 oz (82.3 kg)    General: Alert and in no acute distress. She looks older than her stated age.   HEENT: Normal.  Neck: Supple, no JVD, carotid bruits, or masses noted.  Cardiac: Regular rate and rhythm. Harsh murmur noted. No edema.  Respiratory:  Lungs are clear to auscultation bilaterally with normal work of breathing.  GI: Soft and nontender.  MS: No deformity or atrophy. Gait and ROM intact.  Skin: Warm and dry. Color is normal.  Neuro:  Strength and sensation are intact and no gross focal deficits noted.  Psych: Alert, appropriate and with normal affect.   LABORATORY DATA:  EKG:  EKG is ordered today. This shows NSR - HR is 77 with RBBB.   Lab Results  Component Value Date   WBC 8.4 06/14/2018   HGB 12.1 06/14/2018   HCT 38.8 06/14/2018   PLT 256 06/14/2018   GLUCOSE 137 (H) 06/14/2018   CHOL 135 04/03/2018   TRIG 266 (H) 04/03/2018   HDL 56 04/03/2018   LDLCALC 26 04/03/2018   ALT 44 06/13/2018   AST 33 06/13/2018   NA 140 06/14/2018   K 3.9 06/14/2018   CL 103 06/14/2018   CREATININE 0.78 06/14/2018   BUN 7 06/14/2018   CO2 28 06/14/2018   TSH 1.116 03/11/2017   INR 1.07 12/29/2012   HGBA1C 11.7 (H) 06/13/2018     BNP (last 3 results) No results for input(s): BNP in the last 8760 hours.  ProBNP (last 3 results) No results for input(s): PROBNP in the last 8760 hours.   Other Studies Reviewed Today:  Echo 03/2017 - Left ventricle: The cavity size was normal. Wall thickness was increased in a pattern of moderate LVH. There was  severe focal basal hypertrophy of the septum. SAM is noted. Rest LVOT gradient of 29 mmHg - suggestive of HOCM. The estimated ejection fraction was 75%. Doppler parameters are consistent with abnormal left ventricular relaxation (grade 1 diastolic dysfunction). The E/e&' ratio is >15, suggesting elevated LV filling pressure. LVOT peak gradient (S): 29 mm Hg. - Mitral valve: Thickened leaflet tips. Mild to moderate regurgitation. Calcified annulus. - Left atrium: The atrium was normal in size. - Inferior vena cava: The IVC measures <1.2 cm and collapses spontaneously, suggesting volume depletion and   a low RA pressure of 3 mmHg.  Impressions:  - Compared to a recent study, the quality of this study is improved. There are features are now more consistent with HOCM - including systolic anterior motion of the mitral valve (SAM), elevated LV filling pressure and a rest LVOT gradient of 29 mmHg. There is severe focal basal septal hypertrophy. There is resultant mild to moderate MR, due to SAM as well as mitral leaflet tip thickening and posterior mitral annular calcification. The LV is hyperdynamic with LVEF 75%. The IVC is small, suggesting volume depletion may be a contributing factor.  Assessment/Plan:  1. HTN - not controlled - increasing CCB today - encouraged to try and restrict salt better. Would avoid diuretics. She does not have a BP cuff - will give her one from here today.   2. Palpitations - has had prior PAF - EKG today is NSR. Increasing CCB today. No syncope noted.   3. HOCM - will get her echo updated.   4. Noted spitting up of blood - send for CXR - arrange ENT referral - lab today.   5. Chronic anticoagulation - lab today - CXR today and ENT referral.  6. COVID-19 Education: The signs and symptoms of COVID-19 were discussed with the patient and how to seek care for testing (follow up with PCP or arrange E-visit).  The importance  of social distancing, staying at home, hand hygiene and wearing a mask when out in public were discussed today.  Current medicines are reviewed with the patient today.  The patient does not have concerns regarding medicines other than what has been noted above.  The following changes have been made:  See above.  Labs/ tests ordered today include:    Orders Placed This Encounter  Procedures  . DG Chest 2 View  . Basic metabolic panel  . CBC  . Ambulatory referral to ENT  . EKG 12-Lead  . ECHOCARDIOGRAM COMPLETE     Disposition:   FU with us in a few weeks for further discussion and possible titration of her medicines.    Patient is agreeable to this plan and will call if any problems develop in the interim.   Signed: LORI GERHARDT, NP  11/08/2018 10:16 AM  [facility] [address] [place], [place]  [address] Phone: [phone] Fax: [phone]        

## 2018-11-08 ENCOUNTER — Ambulatory Visit (INDEPENDENT_AMBULATORY_CARE_PROVIDER_SITE_OTHER): Payer: BC Managed Care – PPO | Admitting: Nurse Practitioner

## 2018-11-08 ENCOUNTER — Encounter: Payer: Self-pay | Admitting: Nurse Practitioner

## 2018-11-08 ENCOUNTER — Ambulatory Visit
Admission: RE | Admit: 2018-11-08 | Discharge: 2018-11-08 | Disposition: A | Discharge status: Home / Self Care | Payer: BC Managed Care – PPO | Source: Ambulatory Visit | Attending: Nurse Practitioner | Admitting: Nurse Practitioner

## 2018-11-08 ENCOUNTER — Other Ambulatory Visit: Payer: Self-pay

## 2018-11-08 VITALS — BP 168/90 | HR 79 | Ht 66.0 in | Wt 179.1 lb

## 2018-11-08 DIAGNOSIS — R002 Palpitations: Secondary | ICD-10-CM

## 2018-11-08 DIAGNOSIS — I421 Obstructive hypertrophic cardiomyopathy: Secondary | ICD-10-CM

## 2018-11-08 DIAGNOSIS — R042 Hemoptysis: Secondary | ICD-10-CM

## 2018-11-08 DIAGNOSIS — Z7189 Other specified counseling: Secondary | ICD-10-CM

## 2018-11-08 DIAGNOSIS — Z7901 Long term (current) use of anticoagulants: Secondary | ICD-10-CM | POA: Diagnosis not present

## 2018-11-08 DIAGNOSIS — I480 Paroxysmal atrial fibrillation: Secondary | ICD-10-CM

## 2018-11-08 LAB — BASIC METABOLIC PANEL
BUN/Creatinine Ratio: 16 (ref 9–23)
BUN: 14 mg/dL (ref 6–24)
CO2: 25 mmol/L (ref 20–29)
Calcium: 9.9 mg/dL (ref 8.7–10.2)
Chloride: 98 mmol/L (ref 96–106)
Creatinine, Ser: 0.88 mg/dL (ref 0.57–1.00)
GFR calc Af Amer: 90 mL/min/{1.73_m2} (ref >59)
GFR calc non Af Amer: 78 mL/min/{1.73_m2} (ref >59)
Glucose: 226 mg/dL — ABNORMAL HIGH (ref 65–99)
Potassium: 4.6 mmol/L (ref 3.5–5.2)
Sodium: 136 mmol/L (ref 134–144)

## 2018-11-08 LAB — CBC
Hematocrit: 42.6 % (ref 34.0–46.6)
Hemoglobin: 13.4 g/dL (ref 11.1–15.9)
MCH: 24.7 pg — ABNORMAL LOW (ref 26.6–33.0)
MCHC: 31.5 g/dL (ref 31.5–35.7)
MCV: 79 fL (ref 79–97)
Platelets: 255 10*3/uL (ref 150–450)
RBC: 5.42 x10E6/uL — ABNORMAL HIGH (ref 3.77–5.28)
RDW: 14.4 % (ref 11.7–15.4)
WBC: 7.8 10*3/uL (ref 3.4–10.8)

## 2018-11-08 MED ORDER — VERAPAMIL HCL ER 240 MG PO TBCR: 240.0000 mg | EXTENDED_RELEASE_TABLET | Freq: Every day | ORAL | 3 refills | Status: DC

## 2018-11-08 MED FILL — VERAPAMIL ER 240 MG TABLET: 240 | 90 days supply | Qty: 90 | Fill #0

## 2018-11-08 NOTE — Patient Instructions (Addendum)
After Visit Summary:  We will be checking the following labs today - BMET & CBC  Please go to [facility] [place] on the first floor for a chest Xray - you may walk in.     Medication Instructions:    Continue with your current medicines. BUT  I am increasing the Verapail (Calan) to 240 mg a day - this is at the pharmacy - start this today  If you need a refill on your cardiac medications before your next appointment, please call your pharmacy.     Testing/Procedures To Be Arranged:  Echocardiogram  CXR today  Follow-Up:   See Dr. Johnsie Cancel a few days after your echocardiogram  I have referred you to Dr. Radene Journey with ENT - to evaluate your spitting up blood. His office should be calling you with an appointment.     At [facility], you and your health needs are our priority.  As part of our continuing mission to provide you with exceptional heart care, we have created designated Provider Care Teams.  These Care Teams include your primary Cardiologist (physician) and Advanced Practice Providers (APPs -  Physician Assistants and Nurse Practitioners) who all work together to provide you with the care you need, when you need it.  Special Instructions:  . Stay safe, stay home, wash your hands for at least 20 seconds and wear a mask when out in public.  . It was good to talk with you today. . Stop the take out food and try to use less salt - this will help!   Call the [place] office at [phone] if you have any questions, problems or concerns.

## 2018-11-13 ENCOUNTER — Ambulatory Visit (HOSPITAL_COMMUNITY)
Admission: EM | Admit: 2018-11-13 | Discharge: 2018-11-13 | Disposition: A | Discharge status: Home / Self Care | Payer: BC Managed Care – PPO | Attending: Urgent Care | Admitting: Urgent Care

## 2018-11-13 ENCOUNTER — Other Ambulatory Visit: Payer: Self-pay

## 2018-11-13 ENCOUNTER — Encounter (HOSPITAL_COMMUNITY): Payer: Self-pay

## 2018-11-13 DIAGNOSIS — I480 Paroxysmal atrial fibrillation: Secondary | ICD-10-CM | POA: Diagnosis not present

## 2018-11-13 DIAGNOSIS — M549 Dorsalgia, unspecified: Secondary | ICD-10-CM | POA: Diagnosis not present

## 2018-11-13 DIAGNOSIS — Z7984 Long term (current) use of oral hypoglycemic drugs: Secondary | ICD-10-CM | POA: Insufficient documentation

## 2018-11-13 DIAGNOSIS — R519 Headache, unspecified: Secondary | ICD-10-CM | POA: Diagnosis not present

## 2018-11-13 DIAGNOSIS — U071 COVID-19: Secondary | ICD-10-CM | POA: Diagnosis not present

## 2018-11-13 DIAGNOSIS — R011 Cardiac murmur, unspecified: Secondary | ICD-10-CM | POA: Insufficient documentation

## 2018-11-13 DIAGNOSIS — Z20828 Contact with and (suspected) exposure to other viral communicable diseases: Secondary | ICD-10-CM | POA: Diagnosis not present

## 2018-11-13 DIAGNOSIS — I10 Essential (primary) hypertension: Secondary | ICD-10-CM | POA: Insufficient documentation

## 2018-11-13 DIAGNOSIS — E119 Type 2 diabetes mellitus without complications: Secondary | ICD-10-CM | POA: Insufficient documentation

## 2018-11-13 DIAGNOSIS — Z79899 Other long term (current) drug therapy: Secondary | ICD-10-CM | POA: Diagnosis not present

## 2018-11-13 DIAGNOSIS — Z7901 Long term (current) use of anticoagulants: Secondary | ICD-10-CM | POA: Insufficient documentation

## 2018-11-13 DIAGNOSIS — R0981 Nasal congestion: Secondary | ICD-10-CM | POA: Diagnosis not present

## 2018-11-13 DIAGNOSIS — J069 Acute upper respiratory infection, unspecified: Secondary | ICD-10-CM | POA: Insufficient documentation

## 2018-11-13 DIAGNOSIS — I421 Obstructive hypertrophic cardiomyopathy: Secondary | ICD-10-CM | POA: Insufficient documentation

## 2018-11-13 NOTE — ED Triage Notes (Signed)
Pt states having headache, nasal congestion and generalized body aches x 2 days. Pt is taking ibuprofen yesterday without relief.

## 2018-11-13 NOTE — ED Provider Notes (Signed)
[place]    CSN: [id number] Arrival date & time: 11/13/18  1112      History   Chief Complaint Chief Complaint  Patient presents with  . Headache  . Generalized Body Aches  . Nasal Congestion    HPI [name] is a 48 y.o. female.   The history is provided by the patient. No language interpreter was used.  Headache Pain location:  Generalized Radiates to:  Does not radiate Timing:  Constant Progression:  Worsening Chronicity:  New Relieved by:  Nothing Worsened by:  Nothing Ineffective treatments:  None tried Associated symptoms: no fever and no focal weakness     Past Medical History:  Diagnosis Date  . Bell's palsy 03/2017  . Gallstones   . Gestational diabetes mellitus (GDM) 2005; 2006  . Headache    "monthly" (03/12/2017)  . History of chest pain   . History of prolonged Q-T interval on ECG    a. 09/2012 adm: QTC 506.  Marland Kitchen Hypertension   . PAF (paroxysmal atrial fibrillation) ([place]) 2012   a. 07/2011 ED visit with AF-> converted with flecainide, never followed-up;  b. 05/2012 Recurrent Afib -echo: EF 65-60%, mildly dil LA. Placed on Xarelto/daily Flecainide. c. 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  . Small bowel obstruction ([place]) 09/2010   resolved  . Type II diabetes mellitus ([place])    a. 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 b. 05/2012 A1c 12.6.    Patient Active Problem List   Diagnosis Date Noted  . SBO (small bowel obstruction) ([place]) 06/13/2018  . Musculoskeletal back pain 04/04/2018  . HOCM (hypertrophic obstructive cardiomyopathy) ([place]) 04/03/2017  . Bell's palsy 03/11/2017  . Healthcare maintenance 01/17/2017  . Murmur 03/07/2014  . Atrial fibrillation ([place]) 05/23/2012  . Essential hypertension 05/23/2012  . Diabetes mellitus ([place]) 05/23/2012    Past Surgical History:  Procedure Laterality Date  . CESAREAN SECTION  2005; 2006  . CHOLECYSTECTOMY N/A 03/25/2017   Procedure: LAPAROSCOPIC  CHOLECYSTECTOMY;  Surgeon: Kinsinger, Arta Bruce, MD;  Location: [facility];  Service: General;  Laterality: N/A;  . HERNIA REPAIR    . VENTRAL HERNIA REPAIR  02/05/2005   with mesh    OB History   No obstetric history on file.      Home Medications    Prior to Admission medications   Medication Sig Start Date End Date Taking? Authorizing Provider  blood glucose meter kit and supplies KIT Dispense based on patient and insurance preference. Use up to four times daily as directed. (FOR ICD-9 250.00, 250.01). 06/14/18   Kathi Ludwig, MD  Empagliflozin-metFORMIN HCl (SYNJARDY) 05-998 MG TABS Take 1 tablet by mouth 2 (two) times daily with a meal. 04/27/18   Welford Roche, MD  metoprolol succinate (TOPROL-XL) 50 MG 24 hr tablet TAKE 1 TABLET BY MOUTH DAILY. TAKE WITH OR IMMEDIATELY FOLLOWING A MEAL. Patient taking differently: Take 50 mg by mouth daily.  04/27/18   Santos-Sanchez, Merlene Morse, MD  rivaroxaban (XARELTO) 20 MG TABS tablet Take 1 tablet (20 mg total) by mouth daily with supper. 04/27/18   Welford Roche, MD  verapamil (CALAN-SR) 240 MG CR tablet Take 1 tablet (240 mg total) by mouth at bedtime. 11/08/18   Burtis Junes, NP    Family History Family History  Problem Relation Age of Onset  . Other Father        killed in [place] civil war when he was in his 52's  .  Asthma Mother        alive in her 80's  . Other Sister        A & W  . Other Sister        A & W  . Heart disease Neg Hx     Social History Social History   Tobacco Use  . Smoking status: Never Smoker  . Smokeless tobacco: Never Used  Substance Use Topics  . Alcohol use: Never    Frequency: Never  . Drug use: Never     Allergies   Patient has no known allergies.   Review of Systems Review of Systems  Constitutional: Negative for fever.  Neurological: Positive for headaches. Negative for focal weakness.  All other systems reviewed and are negative.     Physical Exam Triage Vital Signs ED Triage Vitals  Enc Vitals Group     BP 11/13/18 1142 (!) 171/90     Pulse Rate 11/13/18 1142 83     Resp 11/13/18 1142 15     Temp 11/13/18 1142 97.6 F (36.4 C)     Temp Source 11/13/18 1142 Temporal     SpO2 11/13/18 1142 100 %     Weight --      Height --      Head Circumference --      Peak Flow --      Pain Score 11/13/18 1140 8     Pain Loc --      Pain Edu? --      Excl. in [place]? --    No data found.  Updated Vital Signs BP (!) 171/90 (BP Location: Left Arm)   Pulse 83   Temp 97.6 F (36.4 C) (Temporal)   Resp 15   LMP 10/01/2006   SpO2 100%   Visual Acuity Right Eye Distance:   Left Eye Distance:   Bilateral Distance:    Right Eye Near:   Left Eye Near:    Bilateral Near:     Physical Exam Vitals signs and nursing note reviewed.  Constitutional:      Appearance: She is well-developed.  HENT:     Head: Normocephalic.  Neck:     Musculoskeletal: Normal range of motion.  Cardiovascular:     Rate and Rhythm: Normal rate.  Pulmonary:     Effort: Pulmonary effort is normal.  Abdominal:     General: There is no distension.  Musculoskeletal: Normal range of motion.  Neurological:     Mental Status: She is alert and oriented to person, place, and time.  Psychiatric:        Mood and Affect: Mood normal.      UC Treatments / Results  Labs (all labs ordered are listed, but only abnormal results are displayed) Labs Reviewed  SARS CORONAVIRUS 2 (TAT 6-24 HRS)    EKG   Radiology No results found.  Procedures Procedures (including critical care time)  Medications Ordered in UC Medications - No data to display  Initial Impression / Assessment and Plan / UC Course  I have reviewed the triage vital signs and the nursing notes.  Pertinent labs & imaging results that were available during my care of the patient were reviewed by me and considered in my medical decision making (see chart for details).     MDM   covid test ordered.  Pt advised to quarantine.   Final Clinical Impressions(s) / UC Diagnoses   Final diagnoses:  Viral upper respiratory tract infection  Discharge Instructions     Return if any problems.  Take your blood pressure medications as scheduled   ED Prescriptions    None    An After Visit Summary was printed and given to the patient.   PDMP not reviewed this encounter.   Fransico Meadow, [place] 11/13/18 1548

## 2018-11-13 NOTE — Discharge Instructions (Addendum)
Return if any problems.  Take your blood pressure medications as scheduled

## 2018-11-13 NOTE — ED Notes (Signed)
Pt BP reported to provider Santiago Glad.

## 2018-11-14 LAB — SARS CORONAVIRUS 2 (TAT 6-24 HRS): SARS Coronavirus 2: POSITIVE — AB

## 2018-11-15 ENCOUNTER — Telehealth (HOSPITAL_COMMUNITY): Payer: Self-pay | Admitting: Emergency Medicine

## 2018-11-15 NOTE — Telephone Encounter (Signed)
Positive Covid. Pt contacted and made aware of results. Given information about quarantine times. Pt verbalized understanding, all questions answered.

## 2018-11-18 ENCOUNTER — Other Ambulatory Visit (HOSPITAL_COMMUNITY): Payer: BC Managed Care – PPO

## 2018-11-21 ENCOUNTER — Encounter (HOSPITAL_COMMUNITY): Payer: Self-pay | Admitting: Nurse Practitioner

## 2018-11-28 ENCOUNTER — Encounter: Payer: Self-pay | Admitting: Dietician

## 2018-11-28 ENCOUNTER — Ambulatory Visit (INDEPENDENT_AMBULATORY_CARE_PROVIDER_SITE_OTHER): Payer: BC Managed Care – PPO | Admitting: Dietician

## 2018-11-28 ENCOUNTER — Other Ambulatory Visit: Payer: Self-pay

## 2018-11-28 ENCOUNTER — Ambulatory Visit: Payer: BC Managed Care – PPO | Admitting: Internal Medicine

## 2018-11-28 ENCOUNTER — Other Ambulatory Visit: Payer: Self-pay | Admitting: Internal Medicine

## 2018-11-28 ENCOUNTER — Encounter: Payer: Self-pay | Admitting: Internal Medicine

## 2018-11-28 VITALS — BP 135/87 | HR 80 | Temp 98.8°F | Wt 177.8 lb

## 2018-11-28 DIAGNOSIS — E119 Type 2 diabetes mellitus without complications: Secondary | ICD-10-CM | POA: Diagnosis not present

## 2018-11-28 DIAGNOSIS — Z8619 Personal history of other infectious and parasitic diseases: Secondary | ICD-10-CM | POA: Diagnosis not present

## 2018-11-28 DIAGNOSIS — Z794 Long term (current) use of insulin: Secondary | ICD-10-CM | POA: Diagnosis not present

## 2018-11-28 DIAGNOSIS — Z6828 Body mass index (BMI) 28.0-28.9, adult: Secondary | ICD-10-CM | POA: Diagnosis not present

## 2018-11-28 DIAGNOSIS — Z79899 Other long term (current) drug therapy: Secondary | ICD-10-CM

## 2018-11-28 DIAGNOSIS — I10 Essential (primary) hypertension: Secondary | ICD-10-CM | POA: Diagnosis not present

## 2018-11-28 DIAGNOSIS — Z713 Dietary counseling and surveillance: Secondary | ICD-10-CM

## 2018-11-28 DIAGNOSIS — Z8616 Personal history of COVID-19: Secondary | ICD-10-CM | POA: Insufficient documentation

## 2018-11-28 DIAGNOSIS — Z7984 Long term (current) use of oral hypoglycemic drugs: Secondary | ICD-10-CM

## 2018-11-28 DIAGNOSIS — Z23 Encounter for immunization: Secondary | ICD-10-CM | POA: Diagnosis not present

## 2018-11-28 LAB — POCT GLYCOSYLATED HEMOGLOBIN (HGB A1C): Hemoglobin A1C: 11.4 % — AB (ref 4.0–5.6)

## 2018-11-28 LAB — GLUCOSE, CAPILLARY: Glucose-Capillary: 263 mg/dL — ABNORMAL HIGH (ref 70–99)

## 2018-11-28 MED ORDER — ONETOUCH DELICA PLUS LANCET33G MISC: 1.0000 | Freq: Three times a day (TID) | 11 refills | Status: DC

## 2018-11-28 MED ORDER — INSULIN GLARGINE 100 UNITS/ML SOLOSTAR PEN: 10.0000 [IU] | PEN_INJECTOR | Freq: Every day | SUBCUTANEOUS | 3 refills | Status: DC

## 2018-11-28 MED ORDER — ONETOUCH VERIO VI STRP: ORAL_STRIP | 12 refills | Status: DC

## 2018-11-28 MED ORDER — INSULIN DETEMIR 100 UNIT/ML FLEXPEN: 10.0000 [IU] | PEN_INJECTOR | Freq: Every day | SUBCUTANEOUS | 3 refills | Status: DC

## 2018-11-28 MED ORDER — GABAPENTIN 100 MG PO CAPS: 100.0000 mg | ORAL_CAPSULE | Freq: Two times a day (BID) | ORAL | 2 refills | Status: DC

## 2018-11-28 MED ORDER — PEN NEEDLES 32G X 4 MM MISC: 1.0000 | Freq: Every day | 12 refills | Status: DC

## 2018-11-28 MED FILL — LEVEMIR FLEXTOUCH 100 UNITS: 100 | 90 days supply | Qty: 9 | Fill #0

## 2018-11-28 MED FILL — BD PEN NDL NANO 32GX5/32": 32G X 4 MM | 90 days supply | Qty: 100 | Fill #0

## 2018-11-28 MED FILL — ONETOUCH VERIO TEST STRIP: 33 days supply | Qty: 100 | Fill #0

## 2018-11-28 MED FILL — ONETOUCH DELICA PLUS LANCET: 33 days supply | Qty: 100 | Fill #0

## 2018-11-28 NOTE — Progress Notes (Signed)
   CC: T2DM follow up   HPI:  [name] is a 48 y.o. year-old female with PMH listed below who presents to clinic for T2DM follow up. Please see problem based assessment and plan for further details.   Past Medical History:  Diagnosis Date  . Bell's palsy 03/2017  . Gallstones   . Gestational diabetes mellitus (GDM) 2005; 2006  . Headache    "monthly" (03/12/2017)  . History of chest pain   . History of prolonged Q-T interval on ECG    a. 09/2012 adm: QTC 506.  Marland Kitchen Hypertension   . PAF (paroxysmal atrial fibrillation) ([place]) 2012   a. 07/2011 ED visit with AF-> converted with flecainide, never followed-up;  b. 05/2012 Recurrent Afib -echo: EF 65-60%, mildly dil LA. Placed on Xarelto/daily Flecainide. c. 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  . Small bowel obstruction ([place]) 09/2010   resolved  . Type II diabetes mellitus ([place])    a. 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 b. 05/2012 A1c 12.6.   Review of Systems:   Review of Systems  Constitutional: Negative for chills, fever, malaise/fatigue and weight loss.  Respiratory: Negative for cough and shortness of breath.   Cardiovascular: Negative for chest pain, palpitations and leg swelling.  Gastrointestinal: Negative for abdominal pain, constipation, diarrhea, nausea and vomiting.  Neurological: Negative for dizziness and headaches.     Physical Exam:  Vitals:   11/28/18 1006  BP: (!) 173/88  Pulse: 78  Temp: 98.8 F (37.1 C)  TempSrc: Oral  SpO2: 100%  Weight: 177 lb 12.8 oz (80.6 kg)    General: Well-appearing female in no acute distress Cardiac: regular rate and rhythm, nl I2/L7, II/VI systolic murmur, rubs or gallops Pulm: CTAB, no wheezes or crackles, no increased work of breathing on room air     Assessment & Plan:   See Encounters Tab for problem based charting.  Patient discussed with Dr. Rebeca Alert

## 2018-11-28 NOTE — Patient Instructions (Signed)
Ms. Jermiyah, [employer] job today practicing injecting insulin and checking your blood sugar!   Dr. Isac Sarna asked me to call you in 1 week.  I made an appointment for next week to talk on the phone to see how your blood sugars are doing.  See this sheet for the time.   Call me with any questions or concerns.   Butch Penny [phone]

## 2018-11-28 NOTE — Progress Notes (Signed)
Internal Medicine Clinic Attending  Case discussed with Dr. Santos-Sanchez at the time of the visit.  We reviewed the resident's history and exam and pertinent patient test results.  I agree with the assessment, diagnosis, and plan of care documented in the resident's note.  Alexander Raines, M.D., Ph.D.  

## 2018-11-28 NOTE — Assessment & Plan Note (Signed)
Patient was diagnosed with COVID-19 two weeks ago.Experienced HA and sore throat, no respiratory symptoms. She is currently asymptomatic and has completed 14 days of isolation. Provided letter for work stating she can go back to work.

## 2018-11-28 NOTE — Assessment & Plan Note (Addendum)
Patients presents for T2DM follow-up.  She is on Synjardy 05-998 mg BID and reports compliance with it.  Unfortunately her A1c is 11.4 from 10.7 since I last saw her almost a year ago. Weight is stable. She tells me today she used to be on Lantus 55 units before establishing with [place].  She is agreeable to restarting insulin therapy to improve glycemic control.  We will start Levemir at 10 units daily (Lantus not covered by insurance). I will also messaged Dr. Maudie Mercury to see if it would be beneficial to increase her Synjardy to 12.05-998 mg BID. Provided her with a meter.  We will have Butch Penny give her a call next week to check her BG's.  Foot exam completed today.  Referred to ophtho for eye exam. Urine microalbumin obtained.

## 2018-11-28 NOTE — Patient Instructions (Addendum)
Ms. Coccia,   We checked your A1c to see how your diabetes is doing and it was very high. For this reason we started insulin. You will inject 10 units of levemir into the skin every morning. Donnal will give you a call next week to see how your sugar levels are doing on this medication.  We will do your eye exam at your next visit.  Your blood pressure is also high.  I will send a message to your heart doctor to see if you would like to make changes to your blood pressure medications.  I gave you a letter for work saying you can go back to work since you have completed isolation.  If you need to be tested again you can stop by the [facility] health testing center.  Received a flu shot today.

## 2018-11-28 NOTE — Progress Notes (Signed)
  Medical Nutrition Therapy:  Appt start time: 4765 end time:  1030. Visit # 2  Assessment:  Primary concerns today: diabetes education/meal planning.  Ms. Hardgrave is restarting insulin today. She says she has not problems and needed coaching on doing safety check and holding pen in for 10 second bs at the end of her injection  ANTHROPOMETRICS:Estimated body mass index is 28.7 kg/m as calculated from the following:   Height as of 11/08/18: 5\' 6"  (1.676 m).   Weight as of an earlier encounter on 11/28/18: 177 lb 12.8 oz (80.6 kg).  Wt Readings from Last 5 Encounters:  11/28/18 177 lb 12.8 oz (80.6 kg)  11/08/18 179 lb 1.9 oz (81.2 kg)  10/19/18 181 lb (82.1 kg)  06/21/18 181 lb 6.4 oz (82.3 kg)  06/13/18 178 lb 9.2 oz (81 kg)    SLEEP:need to assess at a future visit MEDICATIONS:synjardy 5-1000mg  BID, restating levemir 10 units today  BLOOD SUGAR:216 today in office on her One Touch verio new meter  Lab Results  Component Value Date   HGBA1C 11.4 (A) 11/28/2018   HGBA1C 11.7 (H) 06/13/2018   HGBA1C 10.8 (A) 01/14/2018   HGBA1C 9.7 (A) 07/30/2017   HGBA1C 10.9 (H) 03/17/2017    DIETARY INTAKE: Not done today, at last appointment she was making healthy choices at her meals which she consumed appropriately 3 times a day.  Usual physical activity: her job is active  Progress Towards Goal(s):  In progress.   Nutritional Diagnosis:  NB-1.1 Food and nutrition-related knowledge deficit As related to lack of sufficient prior diabetes training.  As evidenced by her report and questions.    Intervention:  Nutrition education about using an insulin pen, self monitoring review using a one Touch Verio meter. Provided with a sample meter today. Coordination of care: requested needs test strips and lancets  Teaching Method Utilized: Visual, Auditory, Hands on,   Demonstrated degree of understanding via:  Teach Back   Monitoring/Evaluation:  Dietary intake, exercise, meter, and body weight  in 1 month(s)  Debera Lat, RD 11/28/2018 3:13 PM. .

## 2018-11-28 NOTE — Assessment & Plan Note (Signed)
>>  ASSESSMENT AND PLAN FOR ESSENTIAL HYPERTENSION WRITTEN ON 11/28/2018 12:34 PM BY SANTOS-SANCHEZ, IDALYS, MD  Patient presents letter HTN follow-up.  She is on metoprolol  50 mg daily and verapamil  240 mg daily.  She reports compliance with these medications.  Her BP today is initially elevated at 173/80, but at goal when repeated 135/87.  We will continue current management.

## 2018-11-28 NOTE — Assessment & Plan Note (Signed)
Patient presents letter HTN follow-up.  She is on metoprolol 50 mg daily and verapamil 240 mg daily.  She reports compliance with these medications.  Her BP today is initially elevated at 173/80, but at goal when repeated 135/87.  We will continue current management.

## 2018-11-29 LAB — MICROALBUMIN / CREATININE URINE RATIO
Creatinine, Urine: 64.2 mg/dL
Microalb/Creat Ratio: 29 mg/g creat (ref 0–29)
Microalbumin, Urine: 18.7 ug/mL

## 2018-12-06 ENCOUNTER — Encounter: Payer: Self-pay | Admitting: Dietician

## 2018-12-06 ENCOUNTER — Other Ambulatory Visit: Payer: Self-pay

## 2018-12-06 ENCOUNTER — Ambulatory Visit (INDEPENDENT_AMBULATORY_CARE_PROVIDER_SITE_OTHER): Payer: BC Managed Care – PPO | Admitting: Dietician

## 2018-12-06 DIAGNOSIS — E119 Type 2 diabetes mellitus without complications: Secondary | ICD-10-CM

## 2018-12-06 DIAGNOSIS — Z713 Dietary counseling and surveillance: Secondary | ICD-10-CM | POA: Diagnosis not present

## 2018-12-06 NOTE — Progress Notes (Signed)
  Medical Nutrition Therapy:  Appt start time: 5993 end time:  5701 Visit # 3  Assessment:  Primary concerns today: diabetes education/meal planning.  [name]  says she has not had any problems  With her injections or meter over the past week, she has given her insulin every evening. She is a safety check and holding pen in for 10 seconds at the end of her injection every time she injects. I am feeling good.  SLEEP: 8 hours, rates it as a 5/5 MEDICATIONS: synjardy 5-1000mg  BID,  levemir 10 units at bedtime  BLOOD SUGAR: reported by patient on phone today:  110, 100,105, 115 checks in morning and at night , evening 125,  Maybe a little bit higher  DIETARY INTAKE: Not done today, at last appointment she was making healthy choices at her meals which she consumed appropriately 3 times a day.  Usual physical activity: her job is active  Progress Towards Goal(s):  Some progress.   Nutritional Diagnosis:  NB-1.1 Food and nutrition-related knowledge deficit As related to lack of sufficient prior diabetes training is improving as evidenced by her report and questions.    Intervention:  Nutrition support for diabetes self care.  Coordination of care: discuss medications with Dr. Isac Sarna ( I do not see synjardy is not on her medication list)  Teaching Method Utilized: Visual, Auditory, Hands on,   Demonstrated degree of understanding via:  Teach Back   Monitoring/Evaluation:  Dietary intake, exercise, meter, and body weight in 2 week(s)  Debera Lat, RD 12/06/2018 3:17 PM. .

## 2018-12-19 ENCOUNTER — Ambulatory Visit: Payer: BC Managed Care – PPO

## 2018-12-19 ENCOUNTER — Ambulatory Visit: Payer: BC Managed Care – PPO | Admitting: Dietician

## 2018-12-23 ENCOUNTER — Ambulatory Visit: Payer: BC Managed Care – PPO | Admitting: Dietician

## 2018-12-23 ENCOUNTER — Encounter: Payer: BC Managed Care – PPO | Admitting: Internal Medicine

## 2018-12-26 ENCOUNTER — Ambulatory Visit: Payer: BC Managed Care – PPO | Admitting: Cardiovascular Disease

## 2018-12-26 ENCOUNTER — Other Ambulatory Visit (HOSPITAL_COMMUNITY): Payer: BC Managed Care – PPO

## 2018-12-28 MED FILL — VERAPAMIL ER 240 MG TABLET: 240 | 90 days supply | Qty: 90 | Fill #0

## 2018-12-28 MED FILL — metFORMIN HCL 1000 MG TABS: 1000 | 30 days supply | Qty: 60 | Fill #1

## 2018-12-28 MED FILL — METOPROLOL SUCCINATE ER 50: 50 | 30 days supply | Qty: 30 | Fill #2

## 2018-12-28 MED FILL — XARELTO 20 MG TABLET: 20 | 30 days supply | Qty: 30 | Fill #2

## 2019-01-03 ENCOUNTER — Ambulatory Visit (INDEPENDENT_AMBULATORY_CARE_PROVIDER_SITE_OTHER): Payer: BC Managed Care – PPO | Admitting: Otolaryngology

## 2019-01-11 ENCOUNTER — Other Ambulatory Visit: Payer: Self-pay

## 2019-01-11 ENCOUNTER — Ambulatory Visit (HOSPITAL_COMMUNITY): Payer: BC Managed Care – PPO | Attending: Cardiovascular Disease

## 2019-01-11 DIAGNOSIS — I421 Obstructive hypertrophic cardiomyopathy: Secondary | ICD-10-CM | POA: Diagnosis present

## 2019-01-11 DIAGNOSIS — R002 Palpitations: Secondary | ICD-10-CM | POA: Diagnosis present

## 2019-01-11 DIAGNOSIS — R042 Hemoptysis: Secondary | ICD-10-CM | POA: Diagnosis present

## 2019-01-11 DIAGNOSIS — Z7901 Long term (current) use of anticoagulants: Secondary | ICD-10-CM

## 2019-01-16 ENCOUNTER — Other Ambulatory Visit: Payer: Self-pay | Admitting: *Deleted

## 2019-01-16 DIAGNOSIS — I421 Obstructive hypertrophic cardiomyopathy: Secondary | ICD-10-CM

## 2019-01-20 ENCOUNTER — Telehealth: Payer: Self-pay | Admitting: Cardiovascular Disease

## 2019-01-20 NOTE — Telephone Encounter (Signed)
Called patient with results. Per Norma Fredrickson NP, "Ok to report. This study has been reviewed with Dr. Eden Emms - there is some concern for the aortic valve - she has normal pumping function and still has evidence of HOCM - he would like for her to have cardiac MRI to further assess and risk stratify for [facility]. Needs close look at the aortic valve with HOCM study." Patient verbalized understanding. Lori's nurse has already put in orders for MRI. Informed patient someone would be calling her to schedule MRI.

## 2019-01-20 NOTE — Telephone Encounter (Signed)
Patient returning call for echo results. 

## 2019-01-24 ENCOUNTER — Other Ambulatory Visit: Payer: Self-pay | Admitting: *Deleted

## 2019-01-24 DIAGNOSIS — I421 Obstructive hypertrophic cardiomyopathy: Secondary | ICD-10-CM

## 2019-02-10 ENCOUNTER — Telehealth: Payer: Self-pay | Admitting: *Deleted

## 2019-02-10 ENCOUNTER — Encounter: Payer: Self-pay | Admitting: Nurse Practitioner

## 2019-02-10 NOTE — Telephone Encounter (Signed)
Spoke with patient regarding appointment for Cardiac MRI scheduled 03/08/19 at 8:00 am at [facility]----arirval time is 7:15 am 1st floor admissions office----will mail information to patient as well.

## 2019-02-24 ENCOUNTER — Other Ambulatory Visit: Payer: Self-pay

## 2019-02-24 ENCOUNTER — Other Ambulatory Visit: Payer: BC Managed Care – PPO | Admitting: *Deleted

## 2019-02-24 DIAGNOSIS — I421 Obstructive hypertrophic cardiomyopathy: Secondary | ICD-10-CM

## 2019-02-25 LAB — BASIC METABOLIC PANEL
BUN/Creatinine Ratio: 18 (ref 9–23)
BUN: 17 mg/dL (ref 6–24)
CO2: 26 mmol/L (ref 20–29)
Calcium: 9.3 mg/dL (ref 8.7–10.2)
Chloride: 99 mmol/L (ref 96–106)
Creatinine, Ser: 0.93 mg/dL (ref 0.57–1.00)
GFR calc Af Amer: 84 mL/min/{1.73_m2} (ref >59)
GFR calc non Af Amer: 73 mL/min/{1.73_m2} (ref >59)
Glucose: 289 mg/dL — ABNORMAL HIGH (ref 65–99)
Potassium: 4.2 mmol/L (ref 3.5–5.2)
Sodium: 141 mmol/L (ref 134–144)

## 2019-02-27 NOTE — Progress Notes (Signed)
CARDIOLOGY OFFICE NOTE  Date:  03/03/2019    [name] Date of Birth: Mar 10, 1970 Medical Record #[id number]  PCP:  Welford Roche, MD  Cardiologist:  Johnsie Cancel  No chief complaint on file.   History of Present Illness:  49 y.o. with history of HOCM small resting gradient. PAF on xarlto History of palpitations  Seen by NP 11/08/18 ? Hemoptysis CXR NAD referred to ENT BP labile and verapamil dose increased She has not had chest pain, dyspnea or syncope   TTE 01/11/19 with septal thickness 17 mm , 20 mm resting LVOT gradient and trivial MR  She is to have cardiac MRI next week to further risk stratify and take a closer look at AV which had ? Calcified Lippman sachs like lesion on it by echo   She has had a normal myovue 03/23/14 with no history of CAD  Has 15/16 yo kids at home Working at [employer] keeping No dyspnea, syncope or chest pain Compliant with meds   Past Medical History:  Diagnosis Date  . Bell's palsy 03/2017  . Gallstones   . Gestational diabetes mellitus (GDM) 2005; 2006  . Headache    "monthly" (03/12/2017)  . History of chest pain   . History of prolonged Q-T interval on ECG    a. 09/2012 adm: QTC 506.  Marland Kitchen Hypertension   . PAF (paroxysmal atrial fibrillation) ([place]) 2012   a. 07/2011 ED visit with AF-> converted with flecainide, never followed-up;  b. 05/2012 Recurrent Afib -echo: EF 65-60%, mildly dil LA. Placed on Xarelto/daily Flecainide. c. 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  . Small bowel obstruction ([place]) 09/2010   resolved  . Type II diabetes mellitus ([place])    a. 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 b. 05/2012 A1c 12.6.    Past Surgical History:  Procedure Laterality Date  . CESAREAN SECTION  2005; 2006  . CHOLECYSTECTOMY N/A 03/25/2017   Procedure: LAPAROSCOPIC CHOLECYSTECTOMY;  Surgeon: Kinsinger, Arta Bruce, MD;  Location: [facility];  Service: General;  Laterality: N/A;  . HERNIA  REPAIR    . VENTRAL HERNIA REPAIR  02/05/2005   with mesh     Medications: Current Meds  Medication Sig  . blood glucose meter kit and supplies KIT Dispense based on patient and insurance preference. Use up to four times daily as directed. (FOR ICD-9 250.00, 250.01).  Marland Kitchen gabapentin (NEURONTIN) 100 MG capsule Take 1 capsule (100 mg total) by mouth 2 (two) times daily.  Marland Kitchen glucose blood (ONETOUCH VERIO) test strip Use 3x a day  . Insulin Detemir (LEVEMIR) 100 UNIT/ML Pen Inject 10 Units into the skin daily.  . Insulin Pen Needle (PEN NEEDLES) 32G X 4 MM MISC 1 each by Does not apply route daily.  . Lancets (ONETOUCH DELICA PLUS [id number]) MISC 1 each by Does not apply route 3 (three) times daily.  . metoprolol succinate (TOPROL-XL) 50 MG 24 hr tablet TAKE 1 TABLET BY MOUTH DAILY. TAKE WITH OR IMMEDIATELY FOLLOWING A MEAL. (Patient taking differently: Take 50 mg by mouth daily. )  . rivaroxaban (XARELTO) 20 MG TABS tablet Take 1 tablet (20 mg total) by mouth daily with supper.  . verapamil (CALAN-SR) 240 MG CR tablet Take 1 tablet (240 mg total) by mouth at bedtime.     Allergies: No Known Allergies  Social History: The patient  reports that she has never smoked. She has never used smokeless tobacco. She reports that she  does not drink alcohol or use drugs.   Family History: The patient's family history includes Asthma in her mother; Other in her father, sister, and sister.   Review of Systems: Please see the history of present illness.   All other systems are reviewed and negative.   Physical Exam: VS:  BP (!) 148/96   Pulse 77   Ht 5' 6"  (1.676 m)   Wt 184 lb (83.5 kg)   LMP 10/01/2006   SpO2 99%   BMI 29.70 kg/m  .  BMI Body mass index is 29.7 kg/m.  Wt Readings from Last 3 Encounters:  03/03/19 184 lb (83.5 kg)  11/28/18 177 lb 12.8 oz (80.6 kg)  11/08/18 179 lb 1.9 oz (81.2 kg)    Affect appropriate Healthy:  appears stated age HEENT: normal Neck supple with no  adenopathy JVP normal no bruits no thyromegaly Lungs clear with no wheezing and good diaphragmatic motion Heart:  S1/S2 SEM worse with valsalva , no rub, gallop or click PMI normal Abdomen: benighn, BS positve, no tenderness, no AAA no bruit.  No HSM or HJR Distal pulses intact with no bruits No edema Neuro non-focal Skin warm and dry No muscular weakness    LABORATORY DATA:  EKG:  EKG is ordered today. This shows NSR - HR is 77 with RBBB.   Lab Results  Component Value Date   WBC 7.8 11/08/2018   HGB 13.4 11/08/2018   HCT 42.6 11/08/2018   PLT 255 11/08/2018   GLUCOSE 289 (H) 02/24/2019   CHOL 135 04/03/2018   TRIG 266 (H) 04/03/2018   HDL 56 04/03/2018   LDLCALC 26 04/03/2018   ALT 44 06/13/2018   AST 33 06/13/2018   NA 141 02/24/2019   K 4.2 02/24/2019   CL 99 02/24/2019   CREATININE 0.93 02/24/2019   BUN 17 02/24/2019   CO2 26 02/24/2019   TSH 1.116 03/11/2017   INR 1.07 12/29/2012   HGBA1C 11.4 (A) 11/28/2018     BNP (last 3 results) No results for input(s): BNP in the last 8760 hours.  ProBNP (last 3 results) No results for input(s): PROBNP in the last 8760 hours.   Other Studies Reviewed Today:  Echo 01/11/19 reviewed IMPRESSIONS    1. A small mass (0.7 cm x 0.5 cm) is now attached to the LCC of the AoV.  This was not present on the prior study (04/02/2017). This likely  represents calcium on the leaflet but cannot exclude papillary  fibroelastoma. Vegetation less likely as no  destruction of the valve. Consider a TEE for further characterization.  2. There is severe asymmetric hypertrophy of the basal septum up to 1.7  cm consistent with prior history of HOCM (sigmoid septum variant). The  LVOT gradient is up to 20 mmHG at heart rate ~70. No valsalva performed.  Trivial MR. There is minimal SAM.  Gradients and MR are much improved from 04/02/2017.  3. Left ventricular ejection fraction, by visual estimation, is 65 to  70%. The left ventricle  has normal function. There is mildly increased  left ventricular hypertrophy.  4. The left ventricle has no regional wall motion abnormalities.  5. Global right ventricle has normal systolic function.The right  ventricular size is normal. No increase in right ventricular wall  thickness.  6. The mitral valve is degenerative. Trivial mitral valve regurgitation.  7. The tricuspid valve is grossly normal.  8. The aortic valve is tricuspid. Aortic valve regurgitation is not  visualized. No evidence of  aortic valve sclerosis or stenosis.  9. The pulmonic valve was grossly normal. Pulmonic valve regurgitation is  not visualized.  10. Left ventricular diastolic parameters are consistent with Grade I  diastolic dysfunction (impaired relaxation).  11. TR signal is inadequate for assessing pulmonary artery systolic  pressure.  12. Changes from prior study are noted.  13. A prior study was performed on 04/02/2017.   Assessment/Plan:  1. HTN - labile verapamil increased 11/2018   - encouraged to try and restrict salt better. Would avoid diuretics.BP running ok at home   2. PaF:  Maintaining NSR at this time on xarelto  3. HOCM - LVOT gradient rest by TTE 01/11/19 20 mmHg trivial MR To have cardiac MRI to further risk stratify Also with ? Abnormal calcium on AV    4. Hemoptysis:  - 11/2018 ENT CXR with NAD   5. Chronic anticoagulation - stable no further bleeding issues   6. COVID-19 Education: The signs and symptoms of COVID-19 were discussed with the patient and how to seek care for testing (follow up with PCP or arrange E-visit).  The importance of social distancing, staying at home, hand hygiene and wearing a mask when out in public were discussed today.  Current medicines are reviewed with the patient today.  The patient does not have concerns regarding medicines other than what has been noted above.  The following changes have been made:  See above.  Labs/ tests ordered today  include: Cardiac MRI    Orders Placed This Encounter  Procedures  . MR CARDIAC MORPHOLOGY W WO CONTRAST  . Basic metabolic panel     Disposition:   FU with [place] in a few weeks for further discussion and possible titration of her medicines.    Patient is agreeable to this plan and will call if any problems develop in the interim.   Signed: Jenkins Rouge, MD  03/03/2019 9:12 AM  [facility] [address] [place], [place]  [address] Phone: [phone] Fax: [phone]

## 2019-03-03 ENCOUNTER — Other Ambulatory Visit: Payer: Self-pay | Admitting: Internal Medicine

## 2019-03-03 ENCOUNTER — Other Ambulatory Visit: Payer: Self-pay

## 2019-03-03 ENCOUNTER — Ambulatory Visit (INDEPENDENT_AMBULATORY_CARE_PROVIDER_SITE_OTHER): Payer: BC Managed Care – PPO | Admitting: Cardiovascular Disease

## 2019-03-03 ENCOUNTER — Encounter: Payer: Self-pay | Admitting: Cardiovascular Disease

## 2019-03-03 VITALS — BP 148/96 | HR 77 | Ht 66.0 in | Wt 184.0 lb

## 2019-03-03 DIAGNOSIS — I421 Obstructive hypertrophic cardiomyopathy: Secondary | ICD-10-CM | POA: Diagnosis not present

## 2019-03-03 DIAGNOSIS — I10 Essential (primary) hypertension: Secondary | ICD-10-CM

## 2019-03-03 DIAGNOSIS — I480 Paroxysmal atrial fibrillation: Secondary | ICD-10-CM

## 2019-03-03 LAB — BASIC METABOLIC PANEL
BUN/Creatinine Ratio: 16 (ref 9–23)
BUN: 15 mg/dL (ref 6–24)
CO2: 23 mmol/L (ref 20–29)
Calcium: 9.8 mg/dL (ref 8.7–10.2)
Chloride: 97 mmol/L (ref 96–106)
Creatinine, Ser: 0.94 mg/dL (ref 0.57–1.00)
GFR calc Af Amer: 83 mL/min/{1.73_m2} (ref >59)
GFR calc non Af Amer: 72 mL/min/{1.73_m2} (ref >59)
Glucose: 228 mg/dL — ABNORMAL HIGH (ref 65–99)
Potassium: 4.6 mmol/L (ref 3.5–5.2)
Sodium: 136 mmol/L (ref 134–144)

## 2019-03-03 MED FILL — GABAPENTIN 100 MG CAPSULE: 100 | 30 days supply | Qty: 60 | Fill #0

## 2019-03-03 MED FILL — metFORMIN HCL 1000 MG TABS: 1000 | 30 days supply | Qty: 60 | Fill #0

## 2019-03-03 MED FILL — BD PEN NDL NANO 32GX5/32: 32G X 4 MM | 90 days supply | Qty: 100 | Fill #1

## 2019-03-03 MED FILL — VERAPAMIL ER 180 MG TABLET: 180 | 90 days supply | Qty: 90 | Fill #1

## 2019-03-03 MED FILL — ONETOUCH DELICA PLUS LANCET: 33 days supply | Qty: 100 | Fill #1

## 2019-03-03 MED FILL — XARELTO 20 MG TABLET: 20 | 30 days supply | Qty: 30 | Fill #0

## 2019-03-03 MED FILL — LEVEMIR FLEXTOUCH 100 UNITS: 100 | 90 days supply | Qty: 9 | Fill #1

## 2019-03-03 MED FILL — ONETOUCH VERIO TEST STRIP: 33 days supply | Qty: 100 | Fill #1

## 2019-03-03 MED FILL — METOPROLOL SUCCINATE ER 50: 50 | 30 days supply | Qty: 30 | Fill #0

## 2019-03-03 MED FILL — BD PEN NDL NANO 32GX5/32": 32G X 4 MM | 90 days supply | Qty: 100 | Fill #1

## 2019-03-03 NOTE — Patient Instructions (Addendum)
Medication Instructions:   *If you need a refill on your cardiac medications before your next appointment, please call your pharmacy*   Lab Work: Your physician recommends that you have lab work today- BMET  If you have labs (blood work) drawn today and your tests are completely normal, you will receive your results only by: Marland Kitchen MyChart Message (if you have MyChart) OR . A paper copy in the mail If you have any lab test that is abnormal or we need to change your treatment, we will call you to review the results.   Testing/Procedures: Your physician has requested that you have a cardiac MRI. Cardiac MRI uses a computer to create images of your heart as its beating, producing both still and moving pictures of your heart and major blood vessels. For further information please visit [email or web address]. Please follow the instruction sheet given to you today for more information.  Follow-Up: At [facility], you and your health needs are our priority.  As part of our continuing mission to provide you with exceptional heart care, we have created designated Provider Care Teams.  These Care Teams include your primary Cardiologist (physician) and Advanced Practice Providers (APPs -  Physician Assistants and Nurse Practitioners) who all work together to provide you with the care you need, when you need it.  We recommend signing up for the patient portal called "MyChart".  Sign up information is provided on this After Visit Summary.  MyChart is used to connect with patients for Virtual Visits (Telemedicine).  Patients are able to view lab/test results, encounter notes, upcoming appointments, etc.  Non-urgent messages can be sent to your provider as well.   To learn more about what you can do with MyChart, go to [email or web address].    Your next appointment:   6 month(s)  The format for your next appointment:   In Person  Provider:   You may see Dr. Eden Emms or one of the following Advanced  Practice Providers on your designated Care Team:    Norma Fredrickson, NP  Nada Boozer, NP  Georgie Chard, NP

## 2019-03-06 ENCOUNTER — Telehealth (HOSPITAL_COMMUNITY): Payer: Self-pay | Admitting: Emergency Medicine

## 2019-03-06 NOTE — Telephone Encounter (Signed)
Not sure why ? We discussed at our office visit. [name] - see when she wants to reschedule should not be more than 3 months

## 2019-03-06 NOTE — Telephone Encounter (Addendum)
Can we please reschedule cardiac MRI any day on the week of March 22nd? Patient is agreeable to the week of March 22nd and is able to get MRI at that time.

## 2019-03-06 NOTE — Telephone Encounter (Signed)
Called to review cMR instructions to patient. Pt states she did not know about this appt and wishes to reschedule at another date/time.   Rockwell Alexandria RN Navigator Cardiac Imaging [facility] [phone] Office  [phone] Cell

## 2019-03-08 ENCOUNTER — Ambulatory Visit (HOSPITAL_COMMUNITY): Admission: RE | Admit: 2019-03-08 | Payer: BC Managed Care – PPO | Source: Ambulatory Visit

## 2019-03-20 ENCOUNTER — Telehealth: Payer: Self-pay | Admitting: Cardiovascular Disease

## 2019-03-20 ENCOUNTER — Encounter: Payer: Self-pay | Admitting: *Deleted

## 2019-03-20 NOTE — Telephone Encounter (Signed)
Spoke with patient regarding appointment for Cardiac MRI scheduled Monday 04/17/19 at  9:00 am----arrival time is 8:15 am 1st floor radiology at [facility].  Will mail information to patient

## 2019-04-14 ENCOUNTER — Telehealth (HOSPITAL_COMMUNITY): Payer: Self-pay | Admitting: Emergency Medicine

## 2019-04-14 NOTE — Telephone Encounter (Signed)
Attempted to call patient regarding upcoming cardiac CT appointment. °Left message on voicemail with name and callback number °Osha Rane RN Navigator Cardiac Imaging °[facility] °[phone] Office °[phone] Cell ° °

## 2019-04-17 ENCOUNTER — Other Ambulatory Visit: Payer: Self-pay

## 2019-04-17 ENCOUNTER — Ambulatory Visit (HOSPITAL_COMMUNITY)
Admission: RE | Admit: 2019-04-17 | Discharge: 2019-04-17 | Disposition: A | Discharge status: Home / Self Care | Payer: BC Managed Care – PPO | Source: Ambulatory Visit | Attending: Cardiovascular Disease | Admitting: Cardiovascular Disease

## 2019-04-17 DIAGNOSIS — I421 Obstructive hypertrophic cardiomyopathy: Secondary | ICD-10-CM | POA: Insufficient documentation

## 2019-04-17 MED ORDER — GADOBUTROL 1 MMOL/ML IV SOLN
10.0000 mL | Freq: Once | INTRAVENOUS | Status: AC | PRN
  Administered 2019-04-17: 10 mL via INTRAVENOUS

## 2019-04-20 ENCOUNTER — Telehealth: Payer: Self-pay | Admitting: Cardiovascular Disease

## 2019-04-20 NOTE — Telephone Encounter (Signed)
Patient called back to discuss test results. Call was transferred to [facility]

## 2019-04-21 MED FILL — GABAPENTIN 100 MG CAPSULE: 100 | 30 days supply | Qty: 60 | Fill #1

## 2019-04-21 MED FILL — METOPROLOL SUCCINATE ER 50: 50 | 30 days supply | Qty: 30 | Fill #1

## 2019-04-21 MED FILL — metFORMIN HCL 1000 MG TABS: 1000 | 30 days supply | Qty: 60 | Fill #1

## 2019-04-21 MED FILL — ONETOUCH VERIO TEST STRIP: 33 days supply | Qty: 100 | Fill #2

## 2019-04-21 MED FILL — ONETOUCH DELICA PLUS LANCET: 33 days supply | Qty: 100 | Fill #2

## 2019-04-21 MED FILL — XARELTO 20 MG TABLET: 20 | 30 days supply | Qty: 30 | Fill #1

## 2019-04-23 ENCOUNTER — Other Ambulatory Visit: Payer: Self-pay

## 2019-04-23 ENCOUNTER — Encounter (HOSPITAL_COMMUNITY): Payer: Self-pay | Admitting: Emergency Medicine

## 2019-04-23 ENCOUNTER — Emergency Department (HOSPITAL_COMMUNITY): Payer: BC Managed Care – PPO

## 2019-04-23 ENCOUNTER — Emergency Department (HOSPITAL_COMMUNITY)
Admission: EM | Admit: 2019-04-23 | Discharge: 2019-04-23 | Disposition: A | Discharge status: Home / Self Care | Payer: BC Managed Care – PPO | Attending: Emergency Medicine | Admitting: Emergency Medicine

## 2019-04-23 DIAGNOSIS — Z8616 Personal history of COVID-19: Secondary | ICD-10-CM | POA: Insufficient documentation

## 2019-04-23 DIAGNOSIS — R10811 Right upper quadrant abdominal tenderness: Secondary | ICD-10-CM | POA: Insufficient documentation

## 2019-04-23 DIAGNOSIS — N12 Tubulo-interstitial nephritis, not specified as acute or chronic: Secondary | ICD-10-CM

## 2019-04-23 DIAGNOSIS — Z794 Long term (current) use of insulin: Secondary | ICD-10-CM | POA: Diagnosis not present

## 2019-04-23 DIAGNOSIS — R109 Unspecified abdominal pain: Secondary | ICD-10-CM

## 2019-04-23 DIAGNOSIS — E1165 Type 2 diabetes mellitus with hyperglycemia: Secondary | ICD-10-CM | POA: Insufficient documentation

## 2019-04-23 DIAGNOSIS — Z7901 Long term (current) use of anticoagulants: Secondary | ICD-10-CM | POA: Diagnosis not present

## 2019-04-23 DIAGNOSIS — I10 Essential (primary) hypertension: Secondary | ICD-10-CM | POA: Diagnosis not present

## 2019-04-23 DIAGNOSIS — I4891 Unspecified atrial fibrillation: Secondary | ICD-10-CM | POA: Diagnosis not present

## 2019-04-23 DIAGNOSIS — R1031 Right lower quadrant pain: Secondary | ICD-10-CM | POA: Diagnosis present

## 2019-04-23 DIAGNOSIS — Z79899 Other long term (current) drug therapy: Secondary | ICD-10-CM | POA: Diagnosis not present

## 2019-04-23 DIAGNOSIS — N10 Acute tubulo-interstitial nephritis: Secondary | ICD-10-CM | POA: Insufficient documentation

## 2019-04-23 DIAGNOSIS — R739 Hyperglycemia, unspecified: Secondary | ICD-10-CM

## 2019-04-23 LAB — CBC
HCT: 39.7 % (ref 36.0–46.0)
Hemoglobin: 12.2 g/dL (ref 12.0–15.0)
MCH: 24.9 pg — ABNORMAL LOW (ref 26.0–34.0)
MCHC: 30.7 g/dL (ref 30.0–36.0)
MCV: 81 fL (ref 80.0–100.0)
Platelets: 272 10*3/uL (ref 150–400)
RBC: 4.9 MIL/uL (ref 3.87–5.11)
RDW: 14 % (ref 11.5–15.5)
WBC: 9 10*3/uL (ref 4.0–10.5)
nRBC: 0 % (ref 0.0–0.2)

## 2019-04-23 LAB — URINALYSIS, ROUTINE W REFLEX MICROSCOPIC
Bilirubin Urine: NEGATIVE
Glucose, UA: >=500 mg/dL — AB
Hgb urine dipstick: NEGATIVE
Ketones, ur: NEGATIVE mg/dL
Nitrite: NEGATIVE
Protein, ur: NEGATIVE mg/dL
Specific Gravity, Urine: 1.02 (ref 1.005–1.030)
pH: 5 (ref 5.0–8.0)

## 2019-04-23 LAB — COMPREHENSIVE METABOLIC PANEL
ALT: 34 U/L (ref 0–44)
AST: 29 U/L (ref 15–41)
Albumin: 3.8 g/dL (ref 3.5–5.0)
Alkaline Phosphatase: 108 U/L (ref 38–126)
Anion gap: 13 (ref 5–15)
BUN: 10 mg/dL (ref 6–20)
CO2: 25 mmol/L (ref 22–32)
Calcium: 9.4 mg/dL (ref 8.9–10.3)
Chloride: 94 mmol/L — ABNORMAL LOW (ref 98–111)
Creatinine, Ser: 1 mg/dL (ref 0.44–1.00)
GFR calc Af Amer: >60 mL/min (ref >60)
GFR calc non Af Amer: >60 mL/min (ref >60)
Glucose, Bld: 385 mg/dL — ABNORMAL HIGH (ref 70–99)
Potassium: 3.6 mmol/L (ref 3.5–5.1)
Sodium: 132 mmol/L — ABNORMAL LOW (ref 135–145)
Total Bilirubin: 0.6 mg/dL (ref 0.3–1.2)
Total Protein: 7.6 g/dL (ref 6.5–8.1)

## 2019-04-23 LAB — I-STAT BETA HCG BLOOD, ED (MC, WL, AP ONLY): I-stat hCG, quantitative: <5 m[IU]/mL (ref <5)

## 2019-04-23 LAB — LIPASE, BLOOD: Lipase: 28 U/L (ref 11–51)

## 2019-04-23 MED ORDER — SODIUM CHLORIDE 0.9 % IV SOLN
1.0000 g | Freq: Once | INTRAVENOUS | Status: AC
  Administered 2019-04-23: 1 g via INTRAVENOUS
  Filled 2019-04-23: qty 10

## 2019-04-23 MED ORDER — HYDROCODONE-ACETAMINOPHEN 5-325 MG PO TABS: 1.0000 | ORAL_TABLET | ORAL | 0 refills | Status: DC | PRN

## 2019-04-23 MED ORDER — HYDROMORPHONE HCL 1 MG/ML IJ SOLN
1.0000 mg | Freq: Once | INTRAMUSCULAR | Status: AC
  Administered 2019-04-23: 1 mg via INTRAVENOUS
  Filled 2019-04-23: qty 1

## 2019-04-23 MED ORDER — SODIUM CHLORIDE 0.9 % IV BOLUS
1000.0000 mL | Freq: Once | INTRAVENOUS | Status: AC
  Administered 2019-04-23: 1000 mL via INTRAVENOUS

## 2019-04-23 MED ORDER — SODIUM CHLORIDE 0.9% FLUSH: 3.0000 mL | Freq: Once | INTRAVENOUS | Status: DC

## 2019-04-23 MED ORDER — CEPHALEXIN 500 MG PO CAPS: 500.0000 mg | ORAL_CAPSULE | Freq: Four times a day (QID) | ORAL | 0 refills | Status: AC

## 2019-04-23 NOTE — ED Provider Notes (Signed)
[facility] EMERGENCY DEPARTMENT Provider Note   CSN: [id number] Arrival date & time: 04/23/19  0013     History Chief Complaint  Patient presents with  . Abdominal Pain    [name] is a 49 y.o. female.  HPI 49 year old female presents with right-sided back and right lower abdominal pain.  Kind of feels like when she had gallbladder problems and previously has had cholecystectomy.  Pain has been constant for 2 days.  No vomiting, fever, diarrhea.  Pain is worse in the back.  Chronically has increased urine frequency but no new change in this or dysuria.  Tried ibuprofen with no significant relief.  Pain is a 9 out of 10.   Past Medical History:  Diagnosis Date  . Bell's palsy 03/2017  . Gallstones   . Gestational diabetes mellitus (GDM) 2005; 2006  . Headache    "monthly" (03/12/2017)  . History of chest pain   . History of prolonged Q-T interval on ECG    a. 09/2012 adm: QTC 506.  Marland Kitchen Hypertension   . PAF (paroxysmal atrial fibrillation) ([place]) 2012   a. 07/2011 ED visit with AF-> converted with flecainide, never followed-up;  b. 05/2012 Recurrent Afib -echo: EF 65-60%, mildly dil LA. Placed on Xarelto/daily Flecainide. c. 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  . Small bowel obstruction ([place]) 09/2010   resolved  . Type II diabetes mellitus ([place])    a. 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 b. 05/2012 A1c 12.6.    Patient Active Problem List   Diagnosis Date Noted  . History of COVID-19 11/2018 11/28/2018  . SBO (small bowel obstruction) ([place]) 06/13/2018  . HOCM (hypertrophic obstructive cardiomyopathy) ([place]) 04/03/2017  . Bell's palsy 03/11/2017  . Healthcare maintenance 01/17/2017  . Murmur 03/07/2014  . Atrial fibrillation ([place]) 05/23/2012  . Essential hypertension 05/23/2012  . Diabetes mellitus ([place]) 05/23/2012    Past Surgical History:  Procedure Laterality Date  . CESAREAN SECTION  2005; 2006  .  CHOLECYSTECTOMY N/A 03/25/2017   Procedure: LAPAROSCOPIC CHOLECYSTECTOMY;  Surgeon: Kinsinger, Arta Bruce, MD;  Location[facility];  Service: General;  Laterality: N/A;  . HERNIA REPAIR    . VENTRAL HERNIA REPAIR  02/05/2005   with mesh     OB History   No obstetric history on file.     Family History  Problem Relation Age of Onset  . Other Father        killed in [place] civil war when he was in his 53's  . Asthma Mother        alive in her 49's  . Other Sister        A & W  . Other Sister        A & W  . Heart disease Neg Hx     Social History   Tobacco Use  . Smoking status: Never Smoker  . Smokeless tobacco: Never Used  Substance Use Topics  . Alcohol use: Never  . Drug use: Never    Home Medications Prior to Admission medications   Medication Sig Start Date End Date Taking? Authorizing Provider  XARELTO 20 MG TABS tablet TAKE 1 TABLET BY MOUTH ONCE A DAY WITH SUPPER 03/03/19   Welford Roche, MD  blood glucose meter kit and supplies KIT Dispense based on patient and insurance preference. Use up to four times daily as directed. (FOR ICD-9 250.00, 250.01). 06/14/18   Kathi Ludwig, MD  cephALEXin (KEFLEX) 500 MG capsule Take 1 capsule (500 mg total) by mouth 4 (four) times daily for 10 days. 04/23/19 05/03/19  Sherwood Gambler, MD  gabapentin (NEURONTIN) 100 MG capsule Take 1 capsule (100 mg total) by mouth 2 (two) times daily. 11/28/18   Welford Roche, MD  glucose blood (ONETOUCH VERIO) test strip Use 3x a day 11/28/18   Welford Roche, MD  HYDROcodone-acetaminophen (NORCO) 5-325 MG tablet Take 1 tablet by mouth every 4 (four) hours as needed for severe pain. 04/23/19   Sherwood Gambler, MD  Insulin Detemir (LEVEMIR) 100 UNIT/ML Pen Inject 10 Units into the skin daily. 11/28/18   Santos-Sanchez, Merlene Morse, MD  Insulin Pen Needle (PEN NEEDLES) 32G X 4 MM MISC 1 each by Does not apply route daily. 11/28/18   Santos-Sanchez, Merlene Morse,  MD  Lancets [facility] DELICA PLUS [id number]) MISC 1 each by Does not apply route 3 (three) times daily. 11/28/18   Santos-Sanchez, Merlene Morse, MD  metFORMIN (GLUCOPHAGE) 1000 MG tablet TAKE 1 TABLET BY MOUTH 2 TIMES DAILY WITH A MEAL. 03/03/19   Santos-Sanchez, Merlene Morse, MD  metoprolol succinate (TOPROL-XL) 50 MG 24 hr tablet TAKE 1 TABLET BY MOUTH ONCE A DAY WITH OR IMMEDIATELY FOLLOWING A MEAL 03/03/19   Santos-Sanchez, Merlene Morse, MD  verapamil (CALAN-SR) 240 MG CR tablet Take 1 tablet (240 mg total) by mouth at bedtime. 11/08/18   Burtis Junes, NP    Allergies    Patient has no known allergies.  Review of Systems   Review of Systems  Constitutional: Negative for fever.  Gastrointestinal: Positive for abdominal pain. Negative for diarrhea and vomiting.  Genitourinary: Negative for dysuria.  Musculoskeletal: Positive for back pain.  All other systems reviewed and are negative.   Physical Exam Updated Vital Signs BP (!) 185/85 (BP Location: Right Arm)   Pulse 72   Temp 98.7 F (37.1 C) (Oral)   Resp 17   Ht 5' 7"  (1.702 m)   LMP 10/01/2006   SpO2 100%   BMI 28.82 kg/m   Physical Exam Vitals and nursing note reviewed.  Constitutional:      General: She is not in acute distress.    Appearance: She is well-developed. She is not ill-appearing or diaphoretic.  HENT:     Head: Normocephalic and atraumatic.     Right Ear: External ear normal.     Left Ear: External ear normal.     Nose: Nose normal.  Eyes:     General:        Right eye: No discharge.        Left eye: No discharge.  Cardiovascular:     Rate and Rhythm: Normal rate and regular rhythm.     Heart sounds: Normal heart sounds.  Pulmonary:     Effort: Pulmonary effort is normal.     Breath sounds: Normal breath sounds.  Abdominal:     Palpations: Abdomen is soft.     Tenderness: There is abdominal tenderness (RLQ>RUQ) in the right upper quadrant and right lower quadrant. There is right CVA tenderness.  Skin:     General: Skin is warm and dry.  Neurological:     Mental Status: She is alert.  Psychiatric:        Mood and Affect: Mood is not anxious.     ED Results / Procedures / Treatments   Labs (all labs ordered are listed, but only abnormal results are displayed) Labs Reviewed  COMPREHENSIVE METABOLIC PANEL - Abnormal; Notable for the following components:  Result Value   Sodium 132 (*)    Chloride 94 (*)    Glucose, Bld 385 (*)    All other components within normal limits  CBC - Abnormal; Notable for the following components:   MCH 24.9 (*)    All other components within normal limits  URINALYSIS, ROUTINE W REFLEX MICROSCOPIC - Abnormal; Notable for the following components:   Color, Urine STRAW (*)    Glucose, UA >=500 (*)    Leukocytes,Ua MODERATE (*)    Bacteria, UA RARE (*)    All other components within normal limits  URINE CULTURE  LIPASE, BLOOD  I-STAT BETA HCG BLOOD, ED ([facility], [facility], [facility] ONLY)    EKG None  Radiology CT Renal Stone Study  Result Date: 04/23/2019 CLINICAL DATA:  Acute right lower quadrant abdominal pain. EXAM: CT ABDOMEN AND PELVIS WITHOUT CONTRAST TECHNIQUE: Multidetector CT imaging of the abdomen and pelvis was performed following the standard protocol without IV contrast. COMPARISON:  June 13, 2018. FINDINGS: Lower chest: No acute abnormality. Hepatobiliary: Status post cholecystectomy. No biliary dilatation is noted. Hepatic steatosis is noted. Pancreas: Unremarkable. No pancreatic ductal dilatation or surrounding inflammatory changes. Spleen: Normal in size without focal abnormality. Adrenals/Urinary Tract: Adrenal glands are unremarkable. Kidneys are normal, without renal calculi, focal lesion, or hydronephrosis. Bladder is unremarkable. Stomach/Bowel: Stomach is within normal limits. Appendix appears normal. No evidence of bowel wall thickening, distention, or inflammatory changes. Vascular/Lymphatic: No significant vascular findings are present. No enlarged  abdominal or pelvic lymph nodes. Reproductive: Uterus and bilateral adnexa are unremarkable. Other: Status post ventral hernia repair. No definite recurrent hernia is noted. No ascites is noted. Musculoskeletal: No acute or significant osseous findings. IMPRESSION: 1. Hepatic steatosis. 2. Status post ventral hernia repair. No definite recurrent hernia is noted. 3. No other abnormality seen in the abdomen or pelvis. Electronically Signed   By: Marijo Conception M.D.   On: 04/23/2019 09:44    Procedures Procedures (including critical care time)  Medications Ordered in ED Medications  sodium chloride flush (NS) 0.9 % injection 3 mL (has no administration in time range)  cefTRIAXone (ROCEPHIN) 1 g in sodium chloride 0.9 % 100 mL IVPB (has no administration in time range)  HYDROmorphone (DILAUDID) injection 1 mg (1 mg Intravenous Given 04/23/19 0933)  sodium chloride 0.9 % bolus 1,000 mL (0 mLs Intravenous Stopped 04/23/19 1036)    ED Course  I have reviewed the triage vital signs and the nursing notes.  Pertinent labs & imaging results that were available during my care of the patient were reviewed by me and considered in my medical decision making (see chart for details).    MDM Rules/Calculators/A&P                      No obvious intra-abdominal/retroperitoneal pathology on her CT scan.  No obvious ureteral stones.  Pain is much better, no tenderness on reevaluation.  Urine with questionable UTI and given her diabetes I think it is reasonable to treat for urinary tract infection/pyelonephritis.  Cannot pick up her meds until tomorrow so will give dose of Rocephin and prescribed hydrocodone and Keflex.  We discussed return precautions but at this point she appears stable for outpatient management.  WBC is normal.  She is hyperglycemic but no DKA/normal anion gap. Labs reviewed and otherwise OK. CT reviewed by myself. Final Clinical Impression(s) / ED Diagnoses Final diagnoses:  Right flank pain    Pyelonephritis  Hyperglycemia    Rx /  DC Orders ED Discharge Orders         Ordered    HYDROcodone-acetaminophen (NORCO) 5-325 MG tablet  Every 4 hours PRN     04/23/19 1036    cephALEXin (KEFLEX) 500 MG capsule  4 times daily     04/23/19 1036           Sherwood Gambler, MD 04/23/19 1051

## 2019-04-23 NOTE — Discharge Instructions (Addendum)
If you develop fever, vomiting, severe worsening pain, or any other new/concerning symptoms then return to the ER for evaluation.  You may take ibuprofen and/or Tylenol for pain.  If does not help and you may take the hydrocodone prescribed.  Start the antibiotics tomorrow.

## 2019-04-23 NOTE — ED Triage Notes (Signed)
Pt reports right sided lower abdominal pain X2 days.  Denies n/v/d.

## 2019-04-24 MED FILL — CEPHALEXIN 500 MG CAPSULE: 500 | 10 days supply | Qty: 40 | Fill #0

## 2019-04-24 MED FILL — HYDROCODON-APAP 5-325: 5-325 | 2 days supply | Qty: 10 | Fill #0

## 2019-05-01 ENCOUNTER — Encounter: Payer: Self-pay | Admitting: *Deleted

## 2019-05-02 ENCOUNTER — Ambulatory Visit: Payer: BC Managed Care – PPO | Admitting: Internal Medicine

## 2019-05-02 VITALS — BP 188/79 | HR 79 | Temp 99.0°F | Ht 66.0 in | Wt 183.4 lb

## 2019-05-02 DIAGNOSIS — M549 Dorsalgia, unspecified: Secondary | ICD-10-CM

## 2019-05-02 MED ORDER — METHOCARBAMOL 500 MG PO TABS: 500.0000 mg | ORAL_TABLET | Freq: Four times a day (QID) | ORAL | 0 refills | Status: DC | PRN

## 2019-05-02 MED FILL — METHOCARBAMOL 500 MG TABS: 500 | 14 days supply | Qty: 56 | Fill #0

## 2019-05-02 NOTE — Patient Instructions (Signed)
[name] I have written for a muscle relaxer pill to help with your back pain and asked physical therapy to work with you.  Please schedule a follow up appointment with your cardiologist as soon as possible.

## 2019-05-02 NOTE — Progress Notes (Signed)
CC: acute on chronic musculoskeletal back pain  HPI:  [name] is a 49 y.o. female with PMH below.  Today we will address acute on chronic musculoskeletal back pain  Please see A&P for status of the patient's chronic medical conditions  Past Medical History:  Diagnosis Date  . Bell's palsy 03/2017  . Gallstones   . Gestational diabetes mellitus (GDM) 2005; 2006  . Headache    "monthly" (03/12/2017)  . History of chest pain   . History of prolonged Q-T interval on ECG    a. 09/2012 adm: QTC 506.  Marland Kitchen Hypertension   . PAF (paroxysmal atrial fibrillation) ([place]) 2012   a. 07/2011 ED visit with AF-> converted with flecainide, never followed-up;  b. 05/2012 Recurrent Afib -echo: EF 65-60%, mildly dil LA. Placed on Xarelto/daily Flecainide. c. 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  . Small bowel obstruction ([place]) 09/2010   resolved  . Type II diabetes mellitus ([place])    a. 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 b. 05/2012 A1c 12.6.   Review of Systems:  ROS: Pulmonary: pt denies increased work of breathing, shortness of breath,  Cardiac: pt denies palpitations, chest pain,  Abdominal: pt denies abdominal pain, nausea, vomiting, or diarrhea   Physical Exam:  Vitals:   05/02/19 1510  BP: (!) 188/79  Pulse: 79  Temp: 99 F (37.2 C)  TempSrc: Oral  SpO2: 100%  Weight: 183 lb 6.4 oz (83.2 kg)  Height: 5\' 6"  (1.676 m)   Cardiac:  normal rate and rhythm, clear s1 and s2,systolic murmur, no rubs or gallops Pulmonary: CTAB, not in distress MSK: normal range of motion, straight leg test negative Psych: Alert, conversant, in good spirits   Social History   Socioeconomic History  . Marital status: Divorced    Spouse name: Not on file  . Number of children: 2  . Years of education: Not on file  . Highest education level: Not on file  Occupational History  . Occupation: HOUSEKEEPING    Employer: [employer]  Tobacco Use  . Smoking status:  Never Smoker  . Smokeless tobacco: Never Used  Substance and Sexual Activity  . Alcohol use: Never  . Drug use: Never  . Sexual activity: Not Currently    Partners: Male  Other Topics Concern  . Not on file  Social History Narrative   ** Merged History Encounter **       ** Data from: 11/07/10 Enc Dept: [facility]   Pt has 6 sisters but doe not know their health history or status.      Mother and father unknown      Pt is an immigrant from [place], lives in [place], [place] by herself           ** Data from: 09/19/13 Enc Dept: [place]   Lives in [place] with significant other.  Works @ [employer] in [employer].  Does not routinely exercise.   Social Determinants of Health   Financial Resource Strain:   . Difficulty of Paying Living Expenses:   Food Insecurity:   . Worried About Charity fundraiser in the Last Year:   . Arboriculturist in the Last Year:   Transportation Needs:   . Film/video editor (Medical):   Marland Kitchen Lack of Transportation (Non-Medical):   Physical Activity:   . Days of Exercise per Week:   . Minutes of Exercise per Session:   Stress:   .  Feeling of Stress :   Social Connections:   . Frequency of Communication with Friends and Family:   . Frequency of Social Gatherings with Friends and Family:   . Attends Religious Services:   . Active Member of Clubs or Organizations:   . Attends Banker Meetings:   Marland Kitchen Marital Status:   Intimate Partner Violence:   . Fear of Current or Ex-Partner:   . Emotionally Abused:   Marland Kitchen Physically Abused:   . Sexually Abused:     Family History  Problem Relation Age of Onset  . Other Father        killed in [place] civil war when he was in his 67's  . Asthma Mother        alive in her 4's  . Other Sister        A & W  . Other Sister        A & W  . Heart disease Neg Hx     Assessment & Plan:   See Encounters Tab for problem based charting.  Patient discussed with Dr.  Heide Spark

## 2019-05-03 ENCOUNTER — Encounter: Payer: Self-pay | Admitting: Internal Medicine

## 2019-05-03 NOTE — Assessment & Plan Note (Addendum)
Back pain started the week of her ED visit.  She was taking ibuprofen but had worsening back pain.  She doesn't remember injuring it.  She does not have a very physical job.  She has had similar pain March 2019 but it did not radiate around her abdomen.  Pain has not improved since her ED visit but has not worsened.  Pain is worse when laying down at night, not as bad when she is walking.  Norco and ibuprofen has not helped.  No history of cancer, no saddle anesthesia, no weight loss, no leg weakness or radiculopathy.  No bony abnormalities noted on CT scan.   -prescribed muscle relaxer -referred pt to physical therapy for stretching/strenghtening exercises

## 2019-05-03 NOTE — Progress Notes (Signed)
Internal Medicine Clinic Attending  Case discussed with Dr. Winfrey  at the time of the visit.  We reviewed the resident's history and exam and pertinent patient test results.  I agree with the assessment, diagnosis, and plan of care documented in the resident's note.  

## 2019-05-09 ENCOUNTER — Encounter: Payer: Self-pay | Admitting: *Deleted

## 2019-05-12 ENCOUNTER — Emergency Department (HOSPITAL_COMMUNITY)
Admission: EM | Admit: 2019-05-12 | Discharge: 2019-05-12 | Disposition: A | Discharge status: Home / Self Care | Payer: BC Managed Care – PPO | Attending: Emergency Medicine | Admitting: Emergency Medicine

## 2019-05-12 ENCOUNTER — Emergency Department (HOSPITAL_COMMUNITY): Payer: BC Managed Care – PPO

## 2019-05-12 ENCOUNTER — Other Ambulatory Visit: Payer: Self-pay

## 2019-05-12 ENCOUNTER — Encounter (HOSPITAL_COMMUNITY): Payer: Self-pay | Admitting: Emergency Medicine

## 2019-05-12 DIAGNOSIS — R42 Dizziness and giddiness: Secondary | ICD-10-CM | POA: Diagnosis present

## 2019-05-12 DIAGNOSIS — I10 Essential (primary) hypertension: Secondary | ICD-10-CM

## 2019-05-12 DIAGNOSIS — I480 Paroxysmal atrial fibrillation: Secondary | ICD-10-CM | POA: Insufficient documentation

## 2019-05-12 DIAGNOSIS — Z7901 Long term (current) use of anticoagulants: Secondary | ICD-10-CM | POA: Insufficient documentation

## 2019-05-12 DIAGNOSIS — E119 Type 2 diabetes mellitus without complications: Secondary | ICD-10-CM | POA: Diagnosis not present

## 2019-05-12 DIAGNOSIS — Z794 Long term (current) use of insulin: Secondary | ICD-10-CM | POA: Diagnosis not present

## 2019-05-12 LAB — BASIC METABOLIC PANEL
Anion gap: 15 (ref 5–15)
BUN: 11 mg/dL (ref 6–20)
CO2: 27 mmol/L (ref 22–32)
Calcium: 9.2 mg/dL (ref 8.9–10.3)
Chloride: 96 mmol/L — ABNORMAL LOW (ref 98–111)
Creatinine, Ser: 0.79 mg/dL (ref 0.44–1.00)
GFR calc Af Amer: >60 mL/min (ref >60)
GFR calc non Af Amer: >60 mL/min (ref >60)
Glucose, Bld: 151 mg/dL — ABNORMAL HIGH (ref 70–99)
Potassium: 3.4 mmol/L — ABNORMAL LOW (ref 3.5–5.1)
Sodium: 138 mmol/L (ref 135–145)

## 2019-05-12 LAB — CBC WITH DIFFERENTIAL/PLATELET
Abs Immature Granulocytes: 0.02 10*3/uL (ref 0.00–0.07)
Basophils Absolute: 0 10*3/uL (ref 0.0–0.1)
Basophils Relative: 0 %
Eosinophils Absolute: 0.1 10*3/uL (ref 0.0–0.5)
Eosinophils Relative: 1 %
HCT: 40.6 % (ref 36.0–46.0)
Hemoglobin: 12.8 g/dL (ref 12.0–15.0)
Immature Granulocytes: 0 %
Lymphocytes Relative: 33 %
Lymphs Abs: 2.7 10*3/uL (ref 0.7–4.0)
MCH: 25.8 pg — ABNORMAL LOW (ref 26.0–34.0)
MCHC: 31.5 g/dL (ref 30.0–36.0)
MCV: 81.7 fL (ref 80.0–100.0)
Monocytes Absolute: 0.6 10*3/uL (ref 0.1–1.0)
Monocytes Relative: 7 %
Neutro Abs: 4.8 10*3/uL (ref 1.7–7.7)
Neutrophils Relative %: 59 %
Platelets: 260 10*3/uL (ref 150–400)
RBC: 4.97 MIL/uL (ref 3.87–5.11)
RDW: 13.5 % (ref 11.5–15.5)
WBC: 8.2 10*3/uL (ref 4.0–10.5)
nRBC: 0 % (ref 0.0–0.2)

## 2019-05-12 LAB — URINALYSIS, ROUTINE W REFLEX MICROSCOPIC
Bilirubin Urine: NEGATIVE
Glucose, UA: NEGATIVE mg/dL
Ketones, ur: NEGATIVE mg/dL
Leukocytes,Ua: NEGATIVE
Nitrite: NEGATIVE
Protein, ur: NEGATIVE mg/dL
Specific Gravity, Urine: 1.005 (ref 1.005–1.030)
pH: 6 (ref 5.0–8.0)

## 2019-05-12 LAB — TROPONIN I (HIGH SENSITIVITY)
Troponin I (High Sensitivity): 13 ng/L (ref <18)
Troponin I (High Sensitivity): 14 ng/L (ref <18)

## 2019-05-12 LAB — MAGNESIUM: Magnesium: 1.7 mg/dL (ref 1.7–2.4)

## 2019-05-12 MED ORDER — METOPROLOL SUCCINATE ER 25 MG PO TB24
50.0000 mg | ORAL_TABLET | Freq: Once | ORAL | Status: AC
  Administered 2019-05-12: 50 mg via ORAL
  Filled 2019-05-12: qty 2

## 2019-05-12 NOTE — Discharge Instructions (Signed)
Follow-up with your primary doctor to discuss blood pressure medicines. Return to the ED for any worsening of symptoms.

## 2019-05-12 NOTE — ED Notes (Signed)
Discharge instructions discussed with pt. Pt verbalized understanding with no questions at this time. Pt to go home with family.  °

## 2019-05-12 NOTE — ED Triage Notes (Signed)
BIB EMS from work where patient had near syncopal episode. Reported dizziness, blurred vision. Noted to be hypertensive en route. Given 1NTG, 324 ASA. A/OX4.

## 2019-05-12 NOTE — ED Provider Notes (Signed)
[facility] EMERGENCY DEPARTMENT Provider Note   CSN: [id number] Arrival date & time: 05/12/19  1714     History Chief Complaint  Patient presents with  . Hypertension    [name] is a 49 y.o. female.  HPI Patient worked a full day, doing housekeeping work.  Around noon, she had the sensation of a pounding heart.  At the end of the day, around 4:30 PM, she was standing at work, getting ready to go home.  She felt dizzy and eased herself down to the ground.  Her supervisor saw that she had a near syncopal episode and called EMS.  EMS reports that they gave nitroglycerin and aspirin.  She was found to be hypertensive and endorsed dizziness and blurred vision during transit.  In the ED, she remains hypertensive.  She denies any vision changes.  She reports a mild headache that has improved from headache she had earlier in the day.  She denies any areas of numbness or paresthesias.  She denies any chest pain or shortness of breath.  She still has a sensation of a pounding heartbeat in her chest.     Past Medical History:  Diagnosis Date  . Bell's palsy 03/2017  . Gallstones   . Gestational diabetes mellitus (GDM) 2005; 2006  . Headache    "monthly" (03/12/2017)  . History of chest pain   . History of prolonged Q-T interval on ECG    a. 09/2012 adm: QTC 506.  Marland Kitchen Hypertension   . PAF (paroxysmal atrial fibrillation) ([place]) 2012   a. 07/2011 ED visit with AF-> converted with flecainide, never followed-up;  b. 05/2012 Recurrent Afib -echo: EF 65-60%, mildly dil LA. Placed on Xarelto/daily Flecainide. c. 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  . Small bowel obstruction ([address]) 09/2010   resolved  . Type II diabetes mellitus ([place])    a. 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 b. 05/2012 A1c 12.6.    Patient Active Problem List   Diagnosis Date Noted  . History of COVID-19 11/2018 11/28/2018  . SBO (small bowel obstruction) ([place]) 06/13/2018    . Musculoskeletal back pain 04/04/2018  . HOCM (hypertrophic obstructive cardiomyopathy) ([place]) 04/03/2017  . Bell's palsy 03/11/2017  . Healthcare maintenance 01/17/2017  . Murmur 03/07/2014  . Atrial fibrillation ([place]) 05/23/2012  . Essential hypertension 05/23/2012  . Diabetes mellitus ([place]) 05/23/2012    Past Surgical History:  Procedure Laterality Date  . CESAREAN SECTION  2005; 2006  . CHOLECYSTECTOMY N/A 03/25/2017   Procedure: LAPAROSCOPIC CHOLECYSTECTOMY;  Surgeon: Kinsinger, Arta Bruce, MD;  [facility]: [facility];  Service: General;  Laterality: N/A;  . HERNIA REPAIR    . VENTRAL HERNIA REPAIR  02/05/2005   with mesh     OB History   No obstetric history on file.     Family History  Problem Relation Age of Onset  . Other Father        killed in [place] civil war when he was in his 63's  . Asthma Mother        alive in her 52's  . Other Sister        A & W  . Other Sister        A & W  . Heart disease Neg Hx     Social History   Tobacco Use  . Smoking status: Never Smoker  . Smokeless tobacco: Never Used  Substance Use Topics  . Alcohol  use: Never  . Drug use: Never    Home Medications Prior to Admission medications   Medication Sig Start Date End Date Taking? Authorizing Provider  gabapentin (NEURONTIN) 100 MG capsule Take 1 capsule (100 mg total) by mouth 2 (two) times daily. 11/28/18  Yes Welford Roche, MD  HYDROcodone-acetaminophen (NORCO) 5-325 MG tablet Take 1 tablet by mouth every 4 (four) hours as needed for severe pain. 04/23/19  Yes Sherwood Gambler, MD  ibuprofen (ADVIL) 200 MG tablet Take 400 mg by mouth every 6 (six) hours as needed for headache or mild pain.   Yes [provider]  Insulin Detemir (LEVEMIR) 100 UNIT/ML Pen Inject 10 Units into the skin daily. Patient taking differently: Inject 10 Units into the skin at bedtime.  11/28/18  Yes Santos-Sanchez, Merlene Morse, MD  metFORMIN (GLUCOPHAGE) 1000 MG tablet  TAKE 1 TABLET BY MOUTH 2 TIMES DAILY WITH A MEAL. Patient taking differently: Take 1,000 mg by mouth 2 (two) times daily with a meal.  03/03/19  Yes Santos-Sanchez, Merlene Morse, MD  methocarbamol (ROBAXIN) 500 MG tablet Take 1 tablet (500 mg total) by mouth every 6 (six) hours as needed for muscle spasms. 05/02/19  Yes Katherine Roan, MD  metoprolol succinate (TOPROL-XL) 50 MG 24 hr tablet TAKE 1 TABLET BY MOUTH ONCE A DAY WITH OR IMMEDIATELY FOLLOWING A MEAL Patient taking differently: Take 50 mg by mouth in the morning. WITH OR IMMEDIATELY FOLLOWING A MEAL 03/03/19  Yes Santos-Sanchez, Merlene Morse, MD  verapamil (CALAN-SR) 180 MG CR tablet Take 180 mg by mouth daily.   Yes [provider]  XARELTO 20 MG TABS tablet TAKE 1 TABLET BY MOUTH ONCE A DAY WITH SUPPER Patient taking differently: Take 20 mg by mouth at bedtime.  03/03/19  Yes Santos-Sanchez, Merlene Morse, MD  blood glucose meter kit and supplies KIT Dispense based on patient and insurance preference. Use up to four times daily as directed. (FOR ICD-9 250.00, 250.01). 06/14/18   Kathi Ludwig, MD  glucose blood (ONETOUCH VERIO) test strip Use 3x a day 11/28/18   Welford Roche, MD  Insulin Pen Needle (PEN NEEDLES) 32G X 4 MM MISC 1 each by Does not apply route daily. 11/28/18   Santos-Sanchez, Merlene Morse, MD  Lancets [facility] DELICA PLUS [id number]) MISC 1 each by Does not apply route 3 (three) times daily. 11/28/18   Welford Roche, MD  verapamil (CALAN-SR) 240 MG CR tablet Take 1 tablet (240 mg total) by mouth at bedtime. Patient not taking: Reported on 05/12/2019 11/08/18   Burtis Junes, NP    Allergies    Patient has no known allergies.  Review of Systems   Review of Systems  Constitutional: Negative for activity change, appetite change, chills, diaphoresis, fatigue and fever.  HENT: Negative for ear pain and sore throat.   Eyes: Negative for photophobia, pain and visual disturbance.  Respiratory: Negative for cough,  chest tightness and shortness of breath.   Cardiovascular: Negative for chest pain, palpitations and leg swelling.  Gastrointestinal: Negative for abdominal pain, constipation, diarrhea, nausea and vomiting.  Genitourinary: Negative for dysuria and hematuria.  Musculoskeletal: Negative for arthralgias, back pain, neck pain and neck stiffness.  Skin: Negative for color change and rash.  Neurological: Positive for dizziness, light-headedness and headaches. Negative for tremors, seizures, syncope (Near syncope), facial asymmetry, weakness and numbness.  Hematological: Bruises/bleeds easily (On Xarelto for A. fib.).  Psychiatric/Behavioral: Negative.  Negative for confusion and decreased concentration.  All other systems reviewed and are negative.   Physical Exam Updated  Vital Signs BP (!) 176/98 (BP Location: Right Arm)   Pulse 66   Temp 99 F (37.2 C) (Oral)   Resp 16   Ht 5' 6" (1.676 m)   Wt 81.6 kg   LMP 10/01/2006   SpO2 98%   BMI 29.05 kg/m   Physical Exam Vitals and nursing note reviewed.  Constitutional:      General: She is not in acute distress.    Appearance: Normal appearance. She is well-developed. She is not ill-appearing, toxic-appearing or diaphoretic.  HENT:     Head: Normocephalic and atraumatic.     Right Ear: External ear normal.     Left Ear: External ear normal.     Nose: Nose normal. No congestion or rhinorrhea.     Mouth/Throat:     Mouth: Mucous membranes are moist.     Pharynx: Oropharynx is clear.  Eyes:     General: No visual field deficit.    Extraocular Movements: Extraocular movements intact.     Conjunctiva/sclera: Conjunctivae normal.     Pupils: Pupils are equal, round, and reactive to light.     Comments: Visual acuity: 20/40 in left eye, 20/30 in the right eye  Cardiovascular:     Rate and Rhythm: Normal rate and regular rhythm.     Pulses: Normal pulses.     Heart sounds: No murmur. No friction rub.  Pulmonary:     Effort: Pulmonary  effort is normal. No respiratory distress.     Breath sounds: Normal breath sounds. No wheezing, rhonchi or rales.  Chest:     Chest wall: No tenderness.  Abdominal:     General: There is no distension.     Palpations: Abdomen is soft.     Tenderness: There is no abdominal tenderness. There is no right CVA tenderness, left CVA tenderness or guarding.  Musculoskeletal:        General: No swelling, tenderness or deformity. Normal range of motion.     Cervical back: Normal range of motion and neck supple. No rigidity or tenderness.     Right lower leg: No edema.     Left lower leg: No edema.  Skin:    General: Skin is warm and dry.     Capillary Refill: Capillary refill takes less than 2 seconds.     Coloration: Skin is not pale.  Neurological:     General: No focal deficit present.     Mental Status: She is alert and oriented to person, place, and time.     Cranial Nerves: Cranial nerves are intact. No cranial nerve deficit, dysarthria or facial asymmetry.     Sensory: Sensation is intact. No sensory deficit.     Motor: Motor function is intact. No weakness, tremor or abnormal muscle tone.     Coordination: Coordination is intact. Finger-Nose-Finger Test normal.  Psychiatric:        Mood and Affect: Mood normal.        Behavior: Behavior normal.        Thought Content: Thought content normal.        Judgment: Judgment normal.     ED Results / Procedures / Treatments   Labs (all labs ordered are listed, but only abnormal results are displayed) Labs Reviewed  CBC WITH DIFFERENTIAL/PLATELET - Abnormal; Notable for the following components:      Result Value   MCH 25.8 (*)    All other components within normal limits  BASIC METABOLIC PANEL - Abnormal; Notable for the following  components:   Potassium 3.4 (*)    Chloride 96 (*)    Glucose, Bld 151 (*)    All other components within normal limits  URINALYSIS, ROUTINE W REFLEX MICROSCOPIC - Abnormal; Notable for the following  components:   Color, Urine STRAW (*)    Hgb urine dipstick SMALL (*)    Bacteria, UA MANY (*)    All other components within normal limits  URINE CULTURE  MAGNESIUM  TROPONIN I (HIGH SENSITIVITY)  TROPONIN I (HIGH SENSITIVITY)    EKG None  Radiology DG Chest Portable 1 View  Result Date: 05/12/2019 CLINICAL DATA:  Near syncopal episode.  Palpitations. EXAM: PORTABLE CHEST 1 VIEW COMPARISON:  11/08/2018 FINDINGS: Cardiac silhouette is normal in size. No mediastinal or hilar masses. No evidence of adenopathy. Clear lungs.  No pleural effusion or pneumothorax. Skeletal structures are grossly intact. IMPRESSION: No active disease. Electronically Signed   By: Lajean Manes M.D.   On: 05/12/2019 19:00    Procedures Procedures (including critical care time)  Medications Ordered in ED Medications  metoprolol succinate (TOPROL-XL) 24 hr tablet 50 mg (50 mg Oral Given 05/12/19 1905)    ED Course  I have reviewed the triage vital signs and the nursing notes.  Pertinent labs & imaging results that were available during my care of the patient were reviewed by me and considered in my medical decision making (see chart for details).    MDM Rules/Calculators/A&P                      Patient is a 38-year-year-old female who presents after a near syncopal episode at while at work, getting ready to go home.  She also endorses symptoms of pounding heartbeat since noon.  Upon arrival in the ED, her heart rate is normal.  Blood pressure is elevated in the range of 200/100.  She endorses mild headache, that has improved from her headache earlier in the day she reports that she took her home blood pressure medications, metoprolol and verapamil, this morning.  She denies any chest pains, shortness of breath, nausea, vomiting.  On exam, she has no focal neurologic deficits.  Lungs are clear to auscultation.  Patient is well-appearing.  EKG shows right bundle branch block, consistent with the EKG taken in  November of last year.  Given the patient's symptoms of palpitations and near syncope, basic labs were ordered, in addition to troponins.  Home dose of metoprolol was given for blood pressure control.   On reassessment, patient was resting comfortably.  She denies any new or worsening symptoms.  Following dose of metoprolol, blood pressure came down into the range of 160/85.  Lab results, including troponins were unremarkable.  Chest x-ray showed no cardiopulmonary abnormalities.  Given the patient's reassuring ED work-up, patient is at low risk for future adverse event related to her episode of near syncope.  Symptoms of pounding heartbeat have reportedly improved with blood pressure reduction.  Given normal exam and laboratory work-up, there is no evidence of endorgan damage related to her hypertension.  Patient would, however, likely benefit from changes in her antihypertensive regimen.  She was advised to follow-up with her PCP regarding her home medications.  She is also advised to return to the ED if she experiences any further symptoms of syncope or chest discomfort.  She was discharged in stable condition.   Final Clinical Impression(s) / ED Diagnoses Final diagnoses:  Hypertension, unspecified type    Rx / DC Orders ED  Discharge Orders    None       Godfrey Pick, MD 05/13/19 [id number]    Blanchie Dessert, MD 05/14/19 [id number]

## 2019-05-14 LAB — URINE CULTURE: Culture: >=100000 — AB

## 2019-05-15 ENCOUNTER — Telehealth: Payer: Self-pay | Admitting: *Deleted

## 2019-05-15 NOTE — Telephone Encounter (Signed)
Post ED Visit - Positive Culture Follow-up  Culture report reviewed by antimicrobial stewardship pharmacist: [facility] []  , Pharm.D. []  Enzo Bi, Pharm.D., BCPS AQ-ID []  , Pharm.D., BCPS []  Celedonio Miyamoto, Pharm.D., BCPS []  [place], Garvin Fila.D., BCPS, AAHIVP []  , Pharm.D., BCPS, AAHIVP []  Georgina Pillion, PharmD, BCPS []  , PharmD, BCPS []  [place], PharmD, BCPS []  [address], PharmD []  , PharmD, BCPS []  Estella Husk, PharmD  Pharmacy Team []  Lysle Pearl, PharmD []  , PharmD []  Phillips Climes, PharmD []  , Rph []  Agapito Games) , PharmD []  Verlan Friends, PharmD []  , PharmD []  Mervyn Gay, PharmD []  , PharmD []  Vinnie Level, PharmD []  [facility], PharmD []  , PharmD []  Len Childs, PharmD   Positive urine culture, reviewed by , PharmD No urinary symptoms and no further patient follow-up is required at this time.  Greer Pickerel [facility] 05/15/2019, 9:54 AM

## 2019-05-22 ENCOUNTER — Ambulatory Visit: Payer: BC Managed Care – PPO | Admitting: Internal Medicine

## 2019-05-22 ENCOUNTER — Other Ambulatory Visit: Payer: Self-pay

## 2019-05-22 ENCOUNTER — Encounter: Payer: Self-pay | Admitting: Internal Medicine

## 2019-05-22 VITALS — BP 181/87 | HR 72 | Temp 98.4°F | Ht 66.0 in | Wt 183.7 lb

## 2019-05-22 DIAGNOSIS — E1165 Type 2 diabetes mellitus with hyperglycemia: Secondary | ICD-10-CM | POA: Diagnosis not present

## 2019-05-22 DIAGNOSIS — I480 Paroxysmal atrial fibrillation: Secondary | ICD-10-CM

## 2019-05-22 DIAGNOSIS — Z7984 Long term (current) use of oral hypoglycemic drugs: Secondary | ICD-10-CM

## 2019-05-22 DIAGNOSIS — Z7901 Long term (current) use of anticoagulants: Secondary | ICD-10-CM

## 2019-05-22 DIAGNOSIS — M549 Dorsalgia, unspecified: Secondary | ICD-10-CM | POA: Diagnosis not present

## 2019-05-22 DIAGNOSIS — Z794 Long term (current) use of insulin: Secondary | ICD-10-CM

## 2019-05-22 DIAGNOSIS — E119 Type 2 diabetes mellitus without complications: Secondary | ICD-10-CM

## 2019-05-22 DIAGNOSIS — Z1159 Encounter for screening for other viral diseases: Secondary | ICD-10-CM | POA: Insufficient documentation

## 2019-05-22 DIAGNOSIS — Z7184 Encounter for health counseling related to travel: Secondary | ICD-10-CM

## 2019-05-22 DIAGNOSIS — G8929 Other chronic pain: Secondary | ICD-10-CM

## 2019-05-22 DIAGNOSIS — I10 Essential (primary) hypertension: Secondary | ICD-10-CM | POA: Diagnosis not present

## 2019-05-22 LAB — POCT GLYCOSYLATED HEMOGLOBIN (HGB A1C): Hemoglobin A1C: 11.1 % — AB (ref 4.0–5.6)

## 2019-05-22 LAB — GLUCOSE, CAPILLARY: Glucose-Capillary: 189 mg/dL — ABNORMAL HIGH (ref 70–99)

## 2019-05-22 NOTE — Patient Instructions (Signed)
MS. Kinser,   Your sugars are still too high.  Please increase Levemir to 20 units at bedtime.  Your blood pressure is also high.  Please let me know what medications you are taking for blood pressure once you get home today.  This will help me determine if I need to increase or add any new medicine to help with blood pressure control.  I ordered a back support to help with your back pain.  You can also take Robaxin twice a day for this.  Please call the physical therapist to arrange this.  I also referred you to the infectious disease clinic because they have a travel clinic and they can give you the specific prophylaxis that you need before you travel.  Please make a follow-up appointment with me in 4 weeks to recheck on your blood pressure.  - Dr. Evelene Croon

## 2019-05-23 ENCOUNTER — Encounter: Payer: Self-pay | Admitting: Internal Medicine

## 2019-05-23 ENCOUNTER — Telehealth: Payer: Self-pay | Admitting: *Deleted

## 2019-05-23 NOTE — Assessment & Plan Note (Signed)
Patient presents for follow up of R-sided back pain. She was seen for this on 4/18 and recommended to do PT, robaxin, and PRN Ibuprofen. She is taking Robaxin PRN and has only taken it twice because it did not work. She received a voice message to arrange PT but never called them back. She continues to report pain today. It starts in her back and radiates to her abdomen and pelvis. It is exacerbated by movement. CT renal protocol was ordered on 4/18 and did not show nephrolithiasis. She does not have urinary symptoms. Her exam is unremarkable today. Suspect this is MSK in nature. 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    - Outpatient PT - Scheduled Robaxin  - Back brace to wear at work

## 2019-05-23 NOTE — Assessment & Plan Note (Signed)
Patient presents for follow-up of uncontrolled T2DM.  She was started on Levemir 10 units at previous visit after she was found to have a A1c of 11.4.  She is also on maximum dose of Metformin.  Her repeat A1c today is minimally improved at 11.1.  I suspect this could be due to poor adherence to insulin though she reports compliance with it every night.  She checks her blood glucose at home and it is usually at 180-190.  - Increase Levemir from 10 to 20 units nightly - Continue maximum dose Metformin - I do not see a statin in her medication list, will call patient to discuss and start one  - Up to date with foot exam  - Needs eye exam, has been referred but no show to appointment

## 2019-05-23 NOTE — Telephone Encounter (Signed)
Pt stated her doctor wanted her to call back with list of her medications.  Verapamil ER 180 mg daily Metoprolol 50 mg daily Along with metformin , xarelto, insulin and gabpentin

## 2019-05-23 NOTE — Assessment & Plan Note (Signed)
Patient is supposed to be on metoprolol 50 mg daily and verapamil 240 mg daily for this but she is not sure she is on both medications.  She is also on Xarelto 20 mg daily which she reports compliance with.  Please see assessment and plan for essential hypertension for further details.

## 2019-05-23 NOTE — Assessment & Plan Note (Signed)
Patient is planning a trip to [place] next month. Will need malaria prophylaxis and updated vaccinations. I referred her to the travel clinic.

## 2019-05-23 NOTE — Telephone Encounter (Signed)
Order for back brace along with demographics, insurance card, and yesterday's OV notes faxed to the [facility] at [phone]. Fax confirmation receipt received. Kinnie Feil, BSN, RN-BC

## 2019-05-23 NOTE — Assessment & Plan Note (Addendum)
Systolic blood pressure remains uncontrolled.  It is at 170-180 at today's visit.  She is supposed to be on metoprolol 50 mg daily and verapamil To 40 mg daily but she is unable to tell me which medication she is taking.  She does tell me she only takes 1 medication for her blood pressure.  On most recent visit to the ED the pharmacy tech recorded she is not taking verapamil 240 and is on 180 mg daily.  I am not sure as to why or when this was changed.  She also cannot confirm this change for me.  I reviewed last cardiology note from 02/2019 and there is no mention of change in dose of verapamil.  I think the main issue at this point it is probably medication adherence.  I have asked her to call us when she gets home today to let us know what medications she has at home. If not able to do this we will need to find a resources to help with this, maybe someone that goes to her house for medication assistance.This will help me develop plan for her uncontrolled hypertension.  - Follow-up with patient call to check which medication she is taking for BP - If on verapamil 180 mg daily will increase to 240 mg daily.  If already taking to 240 mg daily will plan to add losartan. - Need to avoid diuretics in the setting of HOCM

## 2019-05-23 NOTE — Progress Notes (Signed)
   CC: Follow up of T2DM, HTN, and back pain    HPI:  [name] is a 49 y.o. year-old female with PMH listed below who presents to clinic for follow-up of T2DM, HTN, and back pain. Please see problem based assessment and plan for further details.   Past Medical History:  Diagnosis Date  . Bell's palsy 03/2017  . Gallstones   . Gestational diabetes mellitus (GDM) 2005; 2006  . Headache    "monthly" (03/12/2017)  . History of chest pain   . History of prolonged Q-T interval on ECG    a. 09/2012 adm: QTC 506.  Marland Kitchen Hypertension   . PAF (paroxysmal atrial fibrillation) ([facility]) 2012   a. 07/2011 ED visit with AF-> converted with flecainide, never followed-up;  b. 05/2012 Recurrent Afib -echo: EF 65-60%, mildly dil LA. Placed on Xarelto/daily Flecainide. c. 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  . Small bowel obstruction ([facility]) 09/2010   resolved  . Type II diabetes mellitus ([facility])    a. 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 b. 05/2012 A1c 12.6.   Review of Systems:   Review of Systems  Constitutional: Negative for chills, fever and malaise/fatigue.  Respiratory: Negative for shortness of breath.   Cardiovascular: Negative for chest pain, palpitations and leg swelling.  Genitourinary: Negative for dysuria and urgency.  Musculoskeletal: Positive for back pain. Negative for myalgias.  Neurological: Negative for dizziness and headaches.    Physical Exam:  Vitals:   05/22/19 1509 05/22/19 1512 05/22/19 1557 05/22/19 1559  BP:  (!) 170/86 (!) 181/87   Pulse:  72  72  Temp:  98.4 F (36.9 C)    TempSrc:  Oral    SpO2:  99%  100%  Weight: 183 lb 11.2 oz (83.3 kg)     Height: 5\' 6"  (1.676 m)       General: Well-appearing female in no acute distress Cardiac: regular rate and rhythm  Pulm: CTAB, no wheezes or crackles, no increased work of breathing on room air  Ext: warm and well perfused, no peripheral edema MSK: No gross abnormality on right-sided  back, no tenderness to palpation     Office Visit from 05/22/2019 in [facility]  PHQ-9 Total Score  2      Assessment & Plan:   See Encounters Tab for problem based charting.  Patient discussed with [facility]. [facility]

## 2019-05-23 NOTE — Assessment & Plan Note (Signed)
>>  ASSESSMENT AND PLAN FOR ESSENTIAL HYPERTENSION WRITTEN ON 05/23/2019  7:44 AM BY SANTOS-SANCHEZ, IDALYS, MD  Systolic blood pressure remains uncontrolled.  It is at 170-180 at today's visit.  She is supposed to be on metoprolol  50 mg daily and verapamil  To 40 mg daily but she is unable to tell me which medication she is taking.  She does tell me she only takes 1 medication for her blood pressure.  On most recent visit to the ED the pharmacy tech recorded she is not taking verapamil  240 and is on 180 mg daily.  I am not sure as to why or when this was changed.  She also cannot confirm this change for me.  I reviewed last cardiology note from 02/2019 and there is no mention of change in dose of verapamil .  I think the main issue at this point it is probably medication adherence.  I have asked her to call us  when she gets home today to let us  know what medications she has at home. If not able to do this we will need to find a resources to help with this, maybe someone that goes to her house for medication assistance.This will help me develop plan for her uncontrolled hypertension.  - Follow-up with patient call to check which medication she is taking for BP - If on verapamil  180 mg daily will increase to 240 mg daily.  If already taking to 240 mg daily will plan to add losartan . - Need to avoid diuretics in the setting of HOCM

## 2019-05-23 NOTE — Progress Notes (Signed)
Internal Medicine Clinic Attending  Case discussed with Dr. Santos-Sanchez at the time of the visit.  We reviewed the resident's history and exam and pertinent patient test results.  I agree with the assessment, diagnosis, and plan of care documented in the resident's note.  Kalianne Fetting, M.D., Ph.D.  

## 2019-05-24 NOTE — Telephone Encounter (Signed)
Thank you :)

## 2019-05-29 ENCOUNTER — Telehealth: Payer: Self-pay | Admitting: Internal Medicine

## 2019-05-29 ENCOUNTER — Other Ambulatory Visit: Payer: Self-pay | Admitting: Internal Medicine

## 2019-05-29 MED ORDER — LOSARTAN POTASSIUM 25 MG PO TABS: 25.0000 mg | ORAL_TABLET | Freq: Every day | ORAL | 0 refills | Status: DC

## 2019-05-29 MED ORDER — DOXYCYCLINE HYCLATE 100 MG PO TABS: 100.0000 mg | ORAL_TABLET | Freq: Every day | ORAL | 0 refills | Status: DC

## 2019-05-29 MED FILL — METOPROLOL SUCCINATE ER 50: 50 | 30 days supply | Qty: 30 | Fill #2

## 2019-05-29 MED FILL — DOXYCYCLINE HYCLATE 100 MG: 100 | 50 days supply | Qty: 50 | Fill #0

## 2019-05-29 MED FILL — metFORMIN HCL 1000 MG TABS: 1000 | 30 days supply | Qty: 60 | Fill #2

## 2019-05-29 MED FILL — GABAPENTIN 100 MG CAPSULE: 100 | 30 days supply | Qty: 60 | Fill #2

## 2019-05-29 MED FILL — XARELTO 20 MG TABLET: 20 | 30 days supply | Qty: 30 | Fill #2

## 2019-05-29 MED FILL — LEVEMIR FLEXTOUCH 100 UNITS: 100 | 90 days supply | Qty: 9 | Fill #2

## 2019-05-29 MED FILL — LOSARTAN POTASSIUM 25 MG TA: 25 | 90 days supply | Qty: 90 | Fill #0

## 2019-05-29 NOTE — Addendum Note (Signed)
Addended by: Neomia Dear on: 05/29/2019 02:22 PM   Modules accepted: Orders

## 2019-05-29 NOTE — Telephone Encounter (Signed)
Patient called to confirm her home medications. I updated medication list. Will add losartan 25 mg QD for BP control. Asked her to come back in 1 month to recheck BP and check renal function and lytes.   Also spoke about upcoming trip to [place]. She has contacted the [facility] health department and has an appt for vaccinations. I sent a prescription for doxycycline 100 mg QD for malaria ppx. Instructed her to start day the day prior to leaving the country, to continue taking it every day while she is there and there an additional 4 weeks after she comes back to the [place]. Also discussed side effects with her including photosensitivity and esophagitis since she will be on medications for almost 2 months. Advised to call with any other questions.   Burna Cash, MD  Internal Medicine PGY-3

## 2019-05-30 ENCOUNTER — Other Ambulatory Visit: Payer: Self-pay | Admitting: Internal Medicine

## 2019-05-30 MED FILL — VERAPAMIL ER 180 MG TABLET: 180 | 90 days supply | Qty: 90 | Fill #0

## 2019-06-05 ENCOUNTER — Other Ambulatory Visit: Payer: Self-pay

## 2019-06-05 ENCOUNTER — Encounter (HOSPITAL_COMMUNITY): Payer: Self-pay

## 2019-06-05 ENCOUNTER — Ambulatory Visit (HOSPITAL_COMMUNITY)
Admission: EM | Admit: 2019-06-05 | Discharge: 2019-06-05 | Disposition: A | Discharge status: Home / Self Care | Payer: BC Managed Care – PPO | Attending: Urgent Care | Admitting: Urgent Care

## 2019-06-05 DIAGNOSIS — R030 Elevated blood-pressure reading, without diagnosis of hypertension: Secondary | ICD-10-CM | POA: Insufficient documentation

## 2019-06-05 DIAGNOSIS — Z1152 Encounter for screening for COVID-19: Secondary | ICD-10-CM | POA: Diagnosis not present

## 2019-06-05 DIAGNOSIS — I4891 Unspecified atrial fibrillation: Secondary | ICD-10-CM | POA: Diagnosis present

## 2019-06-05 DIAGNOSIS — I421 Obstructive hypertrophic cardiomyopathy: Secondary | ICD-10-CM | POA: Insufficient documentation

## 2019-06-05 DIAGNOSIS — I10 Essential (primary) hypertension: Secondary | ICD-10-CM | POA: Insufficient documentation

## 2019-06-05 NOTE — ED Triage Notes (Signed)
Pt needs a COVID test for travel. PT denies symptoms at this time.

## 2019-06-05 NOTE — ED Provider Notes (Signed)
[facility]   MRN: [id number] DOB: 05/10/1970  Subjective:   [name] is a 49 y.o. female presenting for Covid screening for travel.  Patient also has a history of HOCM, atrial fibrillation and hypertension.  States that she forgot to take her blood pressure medications this morning.  Denies ROS as below.  No current facility-administered medications for this encounter.  Current Outpatient Medications:  .  blood glucose meter kit and supplies KIT, Dispense based on patient and insurance preference. Use up to four times daily as directed. (FOR ICD-9 250.00, 250.01)., Disp: 1 each, Rfl: 0 .  doxycycline (VIBRA-TABS) 100 MG tablet, Take 1 tablet (100 mg total) by mouth daily. Start on June 1st., Disp: 50 tablet, Rfl: 0 .  gabapentin (NEURONTIN) 100 MG capsule, Take 1 capsule (100 mg total) by mouth 2 (two) times daily., Disp: 60 capsule, Rfl: 2 .  glucose blood (ONETOUCH VERIO) test strip, Use 3x a day, Disp: 100 each, Rfl: 12 .  HYDROcodone-acetaminophen (NORCO) 5-325 MG tablet, Take 1 tablet by mouth every 4 (four) hours as needed for severe pain., Disp: 10 tablet, Rfl: 0 .  Insulin Detemir (LEVEMIR) 100 UNIT/ML Pen, Inject 10 Units into the skin daily. (Patient taking differently: Inject 10 Units into the skin at bedtime. ), Disp: 15 mL, Rfl: 3 .  Insulin Pen Needle (PEN NEEDLES) 32G X 4 MM MISC, 1 each by Does not apply route daily., Disp: 100 each, Rfl: 12 .  Lancets (ONETOUCH DELICA PLUS [id number]) MISC, 1 each by Does not apply route 3 (three) times daily., Disp: 100 each, Rfl: 11 .  losartan (COZAAR) 25 MG tablet, Take 1 tablet (25 mg total) by mouth daily., Disp: 90 tablet, Rfl: 0 .  metFORMIN (GLUCOPHAGE) 1000 MG tablet, TAKE 1 TABLET BY MOUTH 2 TIMES DAILY WITH A MEAL. (Patient taking differently: Take 1,000 mg by mouth 2 (two) times daily with a meal. ), Disp: 60 tablet, Rfl: 5 .  metoprolol succinate (TOPROL-XL) 50 MG 24 hr tablet, TAKE 1 TABLET BY MOUTH ONCE A DAY  WITH OR IMMEDIATELY FOLLOWING A MEAL (Patient taking differently: Take 50 mg by mouth in the morning. WITH OR IMMEDIATELY FOLLOWING A MEAL), Disp: 30 tablet, Rfl: 2 .  verapamil (CALAN-SR) 180 MG CR tablet, TAKE 1 TABLET BY MOUTH ONCE A DAY, Disp: 90 tablet, Rfl: 1 .  XARELTO 20 MG TABS tablet, TAKE 1 TABLET BY MOUTH ONCE A DAY WITH SUPPER (Patient taking differently: Take 20 mg by mouth at bedtime. ), Disp: 30 tablet, Rfl: 4   No Known Allergies  Past Medical History:  Diagnosis Date  . Bell's palsy 03/2017  . Gallstones   . Gestational diabetes mellitus (GDM) 2005; 2006  . Headache    "monthly" (03/12/2017)  . History of chest pain   . History of prolonged Q-T interval on ECG    a. 09/2012 adm: QTC 506.  Marland Kitchen Hypertension   . PAF (paroxysmal atrial fibrillation) ([place]) 2012   a. 07/2011 ED visit with AF-> converted with flecainide, never followed-up;  b. 05/2012 Recurrent Afib -echo: EF 65-60%, mildly dil LA. Placed on Xarelto/daily Flecainide. c. 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  . Small bowel obstruction ([place]) 09/2010   resolved  . Type II diabetes mellitus ([place])    a. 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 b. 05/2012 A1c 12.6.     Past Surgical History:  Procedure Laterality Date  . CESAREAN SECTION  2005; 2006  .  CHOLECYSTECTOMY N/A 03/25/2017   Procedure: LAPAROSCOPIC CHOLECYSTECTOMY;  Surgeon: Kinsinger, Arta Bruce, MD;  Location: [facility];  Service: General;  Laterality: N/A;  . HERNIA REPAIR    . VENTRAL HERNIA REPAIR  02/05/2005   with mesh    Family History  Problem Relation Age of Onset  . Other Father        killed in [place] civil war when he was in his 61's  . Asthma Mother        alive in her 13's  . Other Sister        A & W  . Other Sister        A & W  . Heart disease Neg Hx     Social History   Tobacco Use  . Smoking status: Never Smoker  . Smokeless tobacco: Never Used  Substance Use Topics  .  Alcohol use: Never  . Drug use: Never    Review of Systems  Constitutional: Negative for fever and malaise/fatigue.  HENT: Negative for congestion, ear pain, sinus pain and sore throat.   Eyes: Negative for blurred vision, double vision, discharge and redness.  Respiratory: Negative for cough, hemoptysis, shortness of breath and wheezing.   Cardiovascular: Negative for chest pain.  Gastrointestinal: Negative for abdominal pain, diarrhea, nausea and vomiting.  Genitourinary: Negative for dysuria, flank pain and hematuria.  Musculoskeletal: Negative for myalgias.  Skin: Negative for rash.  Neurological: Negative for dizziness, tingling, sensory change, speech change, focal weakness, weakness and headaches.  Psychiatric/Behavioral: Negative for depression and substance abuse.     Objective:   Vitals: BP (!) 176/87   Pulse 82   Temp 98.9 F (37.2 C) (Oral)   Resp 16   Ht '5\' 6"'$  (1.676 m)   Wt 170 lb (77.1 kg)   LMP 10/01/2006   SpO2 100%   BMI 27.44 kg/m   Physical Exam Constitutional:      General: She is not in acute distress.    Appearance: Normal appearance. She is well-developed. She is not ill-appearing, toxic-appearing or diaphoretic.  HENT:     Head: Normocephalic and atraumatic.     Nose: Nose normal.     Mouth/Throat:     Mouth: Mucous membranes are moist.  Eyes:     Extraocular Movements: Extraocular movements intact.     Pupils: Pupils are equal, round, and reactive to light.  Cardiovascular:     Rate and Rhythm: Normal rate and regular rhythm.     Pulses: Normal pulses.     Heart sounds: Murmur (best heard over LUSB, not new) present. No friction rub. No gallop.   Pulmonary:     Effort: Pulmonary effort is normal. No respiratory distress.     Breath sounds: Normal breath sounds. No stridor. No wheezing, rhonchi or rales.  Musculoskeletal:     Right lower leg: No edema.     Left lower leg: No edema.  Skin:    General: Skin is warm and dry.      Findings: No rash.  Neurological:     Mental Status: She is alert and oriented to person, place, and time.     Cranial Nerves: No cranial nerve deficit.     Motor: No weakness.     Coordination: Coordination normal.     Gait: Gait normal.     Deep Tendon Reflexes: Reflexes normal.     Comments: Negative Romberg and pronator drift.   Psychiatric:        Mood  and Affect: Mood normal.        Behavior: Behavior normal.        Thought Content: Thought content normal.        Judgment: Judgment normal.     Assessment and Plan :   PDMP not reviewed this encounter.  1. Encounter for screening for COVID-19   2. Elevated blood pressure reading   3. Essential hypertension   4. HOCM (hypertrophic obstructive cardiomyopathy) ([place])   5. Atrial fibrillation, unspecified type ([place])     No evidence of acute process related to her blood pressure.  Emphasized need for medical compliance, patient verbalized understanding.  Strict ER precautions. Counseled patient on nature of COVID-19 including modes of transmission, diagnostic testing, management and supportive care.  Counseled on medications used for symptomatic relief. COVID 19 testing is pending.   Jaynee Eagles, PA-C 06/05/19 1230

## 2019-06-05 NOTE — Discharge Instructions (Addendum)

## 2019-06-06 ENCOUNTER — Other Ambulatory Visit: Payer: Self-pay | Admitting: Internal Medicine

## 2019-06-06 ENCOUNTER — Ambulatory Visit: Payer: BC Managed Care – PPO | Admitting: Internal Medicine

## 2019-06-06 ENCOUNTER — Other Ambulatory Visit: Payer: Self-pay

## 2019-06-06 VITALS — BP 140/92 | HR 73 | Temp 98.2°F | Ht 65.0 in | Wt 179.9 lb

## 2019-06-06 DIAGNOSIS — E1165 Type 2 diabetes mellitus with hyperglycemia: Secondary | ICD-10-CM

## 2019-06-06 DIAGNOSIS — I480 Paroxysmal atrial fibrillation: Secondary | ICD-10-CM | POA: Diagnosis not present

## 2019-06-06 DIAGNOSIS — Z7184 Encounter for health counseling related to travel: Secondary | ICD-10-CM | POA: Diagnosis not present

## 2019-06-06 DIAGNOSIS — E119 Type 2 diabetes mellitus without complications: Secondary | ICD-10-CM

## 2019-06-06 DIAGNOSIS — I10 Essential (primary) hypertension: Secondary | ICD-10-CM

## 2019-06-06 DIAGNOSIS — Z794 Long term (current) use of insulin: Secondary | ICD-10-CM

## 2019-06-06 LAB — SARS CORONAVIRUS 2 (TAT 6-24 HRS): SARS Coronavirus 2: NEGATIVE

## 2019-06-06 MED ORDER — RIVAROXABAN 20 MG PO TABS: ORAL_TABLET | ORAL | 4 refills | Status: DC

## 2019-06-06 MED ORDER — VERAPAMIL HCL ER 180 MG PO TBCR: 180.0000 mg | EXTENDED_RELEASE_TABLET | Freq: Every day | ORAL | 1 refills | Status: DC

## 2019-06-06 MED ORDER — METFORMIN HCL 1000 MG PO TABS: ORAL_TABLET | ORAL | 5 refills | Status: DC

## 2019-06-06 MED ORDER — METOPROLOL SUCCINATE ER 50 MG PO TB24: ORAL_TABLET | ORAL | 2 refills | Status: DC

## 2019-06-06 NOTE — Progress Notes (Signed)
   CC: HTN follow up   HPI:  Ms.Gayla T Cochrane is a 49 y.o. with a  Hx as noted below who presents for HTN follow up. Please refer to the problem based charting for details.  Past Medical History:  Diagnosis Date  . Bell's palsy 03/2017  . Gallstones   . Gestational diabetes mellitus (GDM) 2005; 2006  . Headache    "monthly" (03/12/2017)  . History of chest pain   . History of prolonged Q-T interval on ECG    a. 09/2012 adm: QTC 506.  Marland Kitchen Hypertension   . PAF (paroxysmal atrial fibrillation) ([facility]) 2012   a. 07/2011 ED visit with AF-> converted with flecainide, never followed-up;  b. 05/2012 Recurrent Afib -echo: EF 65-60%, mildly dil LA. Placed on Xarelto/daily Flecainide. c. 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  . Small bowel obstruction ([facility]) 09/2010   resolved  . Type II diabetes mellitus ([facility])    a. 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 b. 05/2012 A1c 12.6.   Review of Systems: All systems were reviewed and are otherwise negative unless mentioned in the HPI  Physical Exam:  Vitals:   06/06/19 1515 06/06/19 1520  BP: (!) 166/90 (!) 140/92  Pulse: 74 73  Temp: 98.2 F (36.8 C)   TempSrc: Oral   SpO2: 100%   Weight: 179 lb 14.4 oz (81.6 kg)   Height: 5\' 5"  (1.651 m)    Physical Exam Vitals reviewed.  Constitutional:      General: She is not in acute distress.    Appearance: Normal appearance. She is not ill-appearing or toxic-appearing.  HENT:     Head: Normocephalic and atraumatic.  Cardiovascular:     Rate and Rhythm: Normal rate and regular rhythm.     Pulses: Normal pulses.     Heart sounds: Normal heart sounds. No murmur. No friction rub. No gallop.   Pulmonary:     Effort: Pulmonary effort is normal. No respiratory distress.     Breath sounds: Normal breath sounds. No wheezing or rales.  Abdominal:     General: Bowel sounds are normal. There is no distension.     Palpations: Abdomen is soft.     Tenderness: There is no  abdominal tenderness. There is no guarding.  Musculoskeletal:     Right lower leg: No edema.     Left lower leg: No edema.  Neurological:     General: No focal deficit present.     Mental Status: She is alert and oriented to person, place, and time.  Psychiatric:        Mood and Affect: Mood normal.    Assessment & Plan:   See Encounters Tab for problem based charting.  Patient discussed with Dr. 

## 2019-06-06 NOTE — Assessment & Plan Note (Addendum)
Patient is traveling to [place] tomorrow and is returning July 1.  She will need malaria prophylaxis.  She was referred to the travel clinic, which she was prescribed doxycycline 100 mg daily.  You will need to continue this for 4 weeks after she returns -Start doxycycline 100 mg daily today -Continue doxycycline for 4 weeks after returning from [place]

## 2019-06-06 NOTE — Assessment & Plan Note (Signed)
>>  ASSESSMENT AND PLAN FOR ESSENTIAL HYPERTENSION WRITTEN ON 06/06/2019  4:06 PM BY MARSA BARTER, MD  Patient systolic blood pressure is improved compared to last visit.  Today, her systolic pressure was 166 and on repeat was 140.  Patient denies having any chest pain, vision changes or headaches.  Patient states that she has been compliant with all of her medications including metoprolol  50 mg, verapamil  180 mg daily, and losartan  25 mg.  She has tolerated these medications without any issues. -Continue metoprolol  50 mg daily -Continue verapamil  180 mg daily -Continue losartan  25 mg daily -Follow-up blood pressure readings at next visit once returned from [place]

## 2019-06-06 NOTE — Assessment & Plan Note (Addendum)
Patient systolic blood pressure is improved compared to last visit.  Today, her systolic pressure was 166 and on repeat was 140.  Patient denies having any chest pain, vision changes or headaches.  Patient states that she has been compliant with all of her medications including metoprolol 50 mg, verapamil 180 mg daily, and losartan 25 mg.  She has tolerated these medications without any issues. -Continue metoprolol 50 mg daily -Continue verapamil 180 mg daily -Continue losartan 25 mg daily -Follow-up blood pressure readings at next visit once returned from [place]

## 2019-06-06 NOTE — Assessment & Plan Note (Signed)
Patient has uncontrolled T2DM.  Her last A1c was 11.4 two weeks ago.  At that visit, Dr. Evelene Croon increased her Levemir from 10 units to 20 units nightly.  The patient states she has been tolerating this well, and her glucose readings have averaged in the low 100s.  I encouraged the patient to take her blood glucose meter with her and continue checking her glucose as she travels.  Patient voiced understanding -Continue Levemir 20 units nightly -Refilled Metformin, continue max dose daily -Needs eye exam, will address this at next visit after she returns from [place]

## 2019-06-06 NOTE — Patient Instructions (Addendum)
Thank you for visiting [place] in clinic today. Below is a summary of what we discussed:  1.  Hypertension -I refilled your metoprolol and verapamil medications to make sure you have enough while you are away in [place].  Please pick these medications up before you leave.  2.  Atrial fibrillation -I also refilled your Xarelto to make sure you have enough while you are waiting.  Please pick this medication up before you leave.  3.  Diabetes -I refilled your Metformin.  Please make sure you pick this medication up before you leave.  4.  Malaria prophylaxis -We have prescribed doxycycline 100 mg daily for you to take every day while you are in [place].  Please start taking this medication now.  You will need to take this medication for a month after you return. -One of the side effects of this medication is sun sensitivity.  Please wear sunscreen and be aware you may be easily sunburn on this medication.  5.  Follow-up -Please schedule a follow-up appointment in 3 months after he returned from [place].  Safe travels and be safe! We'll see you when you get back!

## 2019-06-06 NOTE — Assessment & Plan Note (Signed)
Patient takes metoprolol 50 mg daily and verapamil 180 mg daily.  Also takes Xarelto 20 mg daily she is currently in sinus rhythm and denies any palpitations. -Continue home metoprolol, verapamil and Xarelto -Refill these medications in the setting of upcoming travel to [place]

## 2019-06-07 NOTE — Addendum Note (Signed)
Addended by: Neomia Dear on: 06/07/2019 06:10 PM   Modules accepted: Orders

## 2019-06-08 NOTE — Addendum Note (Signed)
Addended by: Blanch Media A on: 06/08/2019 02:43 PM   Modules accepted: Level of Service

## 2019-06-08 NOTE — Progress Notes (Signed)
Internal Medicine Clinic Attending  Case discussed with Dr. Alexander at the time of the visit.  We reviewed the resident's history and exam and pertinent patient test results.  I agree with the assessment, diagnosis, and plan of care documented in the resident's note.  

## 2019-08-04 MED FILL — metFORMIN HCL 1000 MG TABS: 1000 | 30 days supply | Qty: 60 | Fill #3

## 2019-08-04 MED FILL — METOPROLOL SUCCINATE ER 50: 50 | 90 days supply | Qty: 90 | Fill #0

## 2019-09-21 NOTE — Progress Notes (Deleted)
CARDIOLOGY OFFICE NOTE  Date:  09/21/2019    Whitman Hero Date of Birth: 12-09-1970 Medical Record #[id number]  PCP:  Glenford Bayley, MD  Cardiologist:  Eden Emms  No chief complaint on file.   History of Present Illness:  49 y.o. with history of HOCM small resting gradient. PAF on xarlto History of palpitations  Seen by NP 11/08/18 ? Hemoptysis CXR NAD referred to ENT BP labile and verapamil dose increased She has not had chest pain, dyspnea or syncope   TTE 01/11/19 with septal thickness 17 mm , 20 mm resting LVOT gradient and trivial MR    Cardiac MRI done 04/18/19 with sigmoid subtype HOCM < 15% foci of gad uptake in basal anterior septum and mild MR   She has had a normal myovue 03/23/14 with no history of CAD  Has 15/16 yo kids at home Working at [employer] keeping No dyspnea, syncope or chest pain Compliant with meds  DM poorly controlled and insulin recently increased by primary A1c 11.4   Traveled to [place] end of June started on doxycycline for 4 plus weeks   ***  Past Medical History:  Diagnosis Date  . Bell's palsy 03/2017  . Gallstones   . Gestational diabetes mellitus (GDM) 2005; 2006  . Headache    "monthly" (03/12/2017)  . History of chest pain   . History of prolonged Q-T interval on ECG    a. 09/2012 adm: QTC 506.  Marland Kitchen Hypertension   . PAF (paroxysmal atrial fibrillation) ([facility]) 2012   a. 07/2011 ED visit with AF-> converted with flecainide, never followed-up;  b. 05/2012 Recurrent Afib -echo: EF 65-60%, mildly dil LA. Placed on Xarelto/daily Flecainide. c. 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  . Small bowel obstruction ([facility]) 09/2010   resolved  . Type II diabetes mellitus ([facility])    a. 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 b. 05/2012 A1c 12.6.    Past Surgical History:  Procedure Laterality Date  . CESAREAN SECTION  2005; 2006  . CHOLECYSTECTOMY N/A 03/25/2017   Procedure: LAPAROSCOPIC CHOLECYSTECTOMY;  Surgeon:  Kinsinger, De Blanch, MD;  Location: [facility];  Service: General;  Laterality: N/A;  . HERNIA REPAIR    . VENTRAL HERNIA REPAIR  02/05/2005   with mesh     Medications: No outpatient medications have been marked as taking for the 10/03/19 encounter (Appointment) with Wendall Stade, MD.     Allergies: No Known Allergies  Social History: The patient  reports that she has never smoked. She has never used smokeless tobacco. She reports that she does not drink alcohol and does not use drugs.   Family History: The patient's family history includes Asthma in her mother; Other in her father, sister, and sister.   Review of Systems: Please see the history of present illness.   All other systems are reviewed and negative.   Physical Exam: VS:  LMP 10/01/2006  .  BMI There is no height or weight on file to calculate BMI.  Wt Readings from Last 3 Encounters:  06/06/19 179 lb 14.4 oz (81.6 kg)  06/05/19 170 lb (77.1 kg)  05/22/19 183 lb 11.2 oz (83.3 kg)    Affect appropriate Healthy:  appears stated age HEENT: normal Neck supple with no adenopathy JVP normal no bruits no thyromegaly Lungs clear with no wheezing and good diaphragmatic motion Heart:  S1/S2 SEM worse with valsalva , no rub, gallop or click PMI normal  Abdomen: benighn, BS positve, no tenderness, no AAA no bruit.  No HSM or HJR Distal pulses intact with no bruits No edema Neuro non-focal Skin warm and dry No muscular weakness   LABORATORY DATA:  EKG:  EKG is ordered today. This shows NSR - HR is 77 with RBBB.   Lab Results  Component Value Date   WBC 8.2 05/12/2019   HGB 12.8 05/12/2019   HCT 40.6 05/12/2019   PLT 260 05/12/2019   GLUCOSE 151 (H) 05/12/2019   CHOL 135 04/03/2018   TRIG 266 (H) 04/03/2018   HDL 56 04/03/2018   LDLCALC 26 04/03/2018   ALT 34 04/23/2019   AST 29 04/23/2019   NA 138 05/12/2019   K 3.4 (L) 05/12/2019   CL 96 (L) 05/12/2019   CREATININE 0.79  05/12/2019   BUN 11 05/12/2019   CO2 27 05/12/2019   TSH 1.116 03/11/2017   INR 1.07 12/29/2012   HGBA1C 11.1 (A) 05/22/2019     BNP (last 3 results) No results for input(s): BNP in the last 8760 hours.  ProBNP (last 3 results) No results for input(s): PROBNP in the last 8760 hours.   Other Studies Reviewed Today:  Echo 01/11/19 reviewed IMPRESSIONS    1. A small mass (0.7 cm x 0.5 cm) is now attached to the LCC of the AoV.  This was not present on the prior study (04/02/2017). This likely  represents calcium on the leaflet but cannot exclude papillary  fibroelastoma. Vegetation less likely as no  destruction of the valve. Consider a TEE for further characterization.  2. There is severe asymmetric hypertrophy of the basal septum up to 1.7  cm consistent with prior history of HOCM (sigmoid septum variant). The  LVOT gradient is up to 20 mmHG at heart rate ~70. No valsalva performed.  Trivial MR. There is minimal SAM.  Gradients and MR are much improved from 04/02/2017.  3. Left ventricular ejection fraction, by visual estimation, is 65 to  70%. The left ventricle has normal function. There is mildly increased  left ventricular hypertrophy.  4. The left ventricle has no regional wall motion abnormalities.  5. Global right ventricle has normal systolic function.The right  ventricular size is normal. No increase in right ventricular wall  thickness.  6. The mitral valve is degenerative. Trivial mitral valve regurgitation.  7. The tricuspid valve is grossly normal.  8. The aortic valve is tricuspid. Aortic valve regurgitation is not  visualized. No evidence of aortic valve sclerosis or stenosis.  9. The pulmonic valve was grossly normal. Pulmonic valve regurgitation is  not visualized.  10. Left ventricular diastolic parameters are consistent with Grade I  diastolic dysfunction (impaired relaxation).  11. TR signal is inadequate for assessing pulmonary artery systolic    pressure.  12. Changes from prior study are noted.  13. A prior study was performed on 04/02/2017.  Cardiac MRI 04/17/19  IMPRESSION: 1. Hypertrophic cardiomyopathy with obstruction, sigmoid subtype. Maximal wall thickness 17 mm at the basal anteroseptum.  2. Mild systolic anterior motion of the mitral valve with mild mitral valve regurgitation. Left ventricular outflow tract obstruction.  3. Focus of delayed myocardial enhancement seen in the basal anteroseptum, in the area of maximal wall thickness. This focus is qualitatively <15% of the myocardial mass.  4. Aortic valve is tricuspid. Small valvular lesions not able to be assessed on this exam as noted in findings.  Assessment/Plan:  1. HTN - labile verapamil increased 11/2018   - encouraged to  try and restrict salt better. Would avoid diuretics.BP running ok at home   2. PaF:  Maintaining NSR at this time on xarelto  3. HOCM - LVOT gradient rest by TTE 01/11/19 20 mmHg trivial MR  MRI consistent with HOCM and lower risk on scale   4. Hemoptysis:  - 11/2018 ENT CXR with NAD   5. Chronic anticoagulation - stable no further bleeding issues   6. DM:  Poorly controlled A1c 11.4 Levemir increased ty Dr Evelene Croon   7. COVID-19 Education: The signs and symptoms of COVID-19 were discussed with the patient and how to seek care for testing (follow up with PCP or arrange E-visit).  The importance of social distancing, staying at home, hand hygiene and wearing a mask when out in public were discussed today.  Current medicines are reviewed with the patient today.  The patient does not have concerns regarding medicines other than what has been noted above.  The following changes have been made:  See above.  Labs/ tests ordered today include: None    No orders of the defined types were placed in this encounter.    Disposition:   F/U in a year  Patient is agreeable to this plan and will call if any problems develop in the interim.    Signed: Charlton Haws, MD  09/21/2019 9:15 AM  [facility] [address] [place], [place]  [address] Phone: [phone] Fax: [phone]

## 2019-10-03 ENCOUNTER — Ambulatory Visit: Payer: BC Managed Care – PPO | Admitting: Cardiovascular Disease

## 2019-11-06 MED FILL — VERAPAMIL ER 180 MG TABLET: 180 | 90 days supply | Qty: 90 | Fill #1

## 2019-11-06 MED FILL — XARELTO 20 MG TABLET: 20 | 90 days supply | Qty: 90 | Fill #0

## 2019-11-06 MED FILL — METFORMIN HCL 1000 MG TABS: 1000 | 30 days supply | Qty: 60 | Fill #4

## 2019-11-06 MED FILL — LEVEMIR FLEXTOUCH 100 UNITS: 100 | 90 days supply | Qty: 9 | Fill #3

## 2019-11-06 MED FILL — METOPROLOL SUCCINATE ER 50: 50 | 90 days supply | Qty: 90 | Fill #1

## 2019-12-01 ENCOUNTER — Emergency Department (HOSPITAL_COMMUNITY): Payer: BC Managed Care – PPO

## 2019-12-01 ENCOUNTER — Encounter (HOSPITAL_COMMUNITY): Payer: Self-pay

## 2019-12-01 ENCOUNTER — Observation Stay (HOSPITAL_BASED_OUTPATIENT_CLINIC_OR_DEPARTMENT_OTHER): Payer: BC Managed Care – PPO

## 2019-12-01 ENCOUNTER — Other Ambulatory Visit: Payer: Self-pay

## 2019-12-01 ENCOUNTER — Observation Stay (HOSPITAL_COMMUNITY)
Admission: EM | Admit: 2019-12-01 | Discharge: 2019-12-02 | Disposition: A | Discharge status: Home / Self Care | Payer: BC Managed Care – PPO | Attending: Internal Medicine | Admitting: Internal Medicine

## 2019-12-01 DIAGNOSIS — Z7901 Long term (current) use of anticoagulants: Secondary | ICD-10-CM | POA: Diagnosis not present

## 2019-12-01 DIAGNOSIS — Z794 Long term (current) use of insulin: Secondary | ICD-10-CM | POA: Diagnosis not present

## 2019-12-01 DIAGNOSIS — I480 Paroxysmal atrial fibrillation: Secondary | ICD-10-CM | POA: Diagnosis not present

## 2019-12-01 DIAGNOSIS — Z20822 Contact with and (suspected) exposure to covid-19: Secondary | ICD-10-CM | POA: Insufficient documentation

## 2019-12-01 DIAGNOSIS — Z7984 Long term (current) use of oral hypoglycemic drugs: Secondary | ICD-10-CM | POA: Diagnosis not present

## 2019-12-01 DIAGNOSIS — R739 Hyperglycemia, unspecified: Secondary | ICD-10-CM

## 2019-12-01 DIAGNOSIS — I158 Other secondary hypertension: Secondary | ICD-10-CM

## 2019-12-01 DIAGNOSIS — E1165 Type 2 diabetes mellitus with hyperglycemia: Secondary | ICD-10-CM | POA: Diagnosis not present

## 2019-12-01 DIAGNOSIS — Z79899 Other long term (current) drug therapy: Secondary | ICD-10-CM | POA: Insufficient documentation

## 2019-12-01 DIAGNOSIS — R079 Chest pain, unspecified: Secondary | ICD-10-CM | POA: Diagnosis present

## 2019-12-01 DIAGNOSIS — E119 Type 2 diabetes mellitus without complications: Secondary | ICD-10-CM

## 2019-12-01 DIAGNOSIS — I10 Essential (primary) hypertension: Secondary | ICD-10-CM | POA: Diagnosis not present

## 2019-12-01 DIAGNOSIS — R072 Precordial pain: Secondary | ICD-10-CM

## 2019-12-01 DIAGNOSIS — R778 Other specified abnormalities of plasma proteins: Secondary | ICD-10-CM | POA: Insufficient documentation

## 2019-12-01 LAB — GLUCOSE, CAPILLARY
Glucose-Capillary: 233 mg/dL — ABNORMAL HIGH (ref 70–99)
Glucose-Capillary: 194 mg/dL — ABNORMAL HIGH (ref 70–99)
Glucose-Capillary: 141 mg/dL — ABNORMAL HIGH (ref 70–99)

## 2019-12-01 LAB — RESP PANEL BY RT-PCR (FLU A&B, COVID) ARPGX2
Influenza A by PCR: NEGATIVE
Influenza B by PCR: NEGATIVE
SARS Coronavirus 2 by RT PCR: NEGATIVE

## 2019-12-01 LAB — CBC
HCT: 38.9 % (ref 36.0–46.0)
Hemoglobin: 12.3 g/dL (ref 12.0–15.0)
MCH: 25.8 pg — ABNORMAL LOW (ref 26.0–34.0)
MCHC: 31.6 g/dL (ref 30.0–36.0)
MCV: 81.6 fL (ref 80.0–100.0)
Platelets: 233 10*3/uL (ref 150–400)
RBC: 4.77 MIL/uL (ref 3.87–5.11)
RDW: 13.2 % (ref 11.5–15.5)
WBC: 7.9 10*3/uL (ref 4.0–10.5)
nRBC: 0 % (ref 0.0–0.2)

## 2019-12-01 LAB — I-STAT BETA HCG BLOOD, ED (MC, WL, AP ONLY): I-stat hCG, quantitative: <5 m[IU]/mL (ref <5)

## 2019-12-01 LAB — BASIC METABOLIC PANEL
Anion gap: 14 (ref 5–15)
BUN: 17 mg/dL (ref 6–20)
CO2: 24 mmol/L (ref 22–32)
Calcium: 9.4 mg/dL (ref 8.9–10.3)
Chloride: 97 mmol/L — ABNORMAL LOW (ref 98–111)
Creatinine, Ser: 1.15 mg/dL — ABNORMAL HIGH (ref 0.44–1.00)
GFR, Estimated: 59 mL/min — ABNORMAL LOW (ref >60)
Glucose, Bld: 293 mg/dL — ABNORMAL HIGH (ref 70–99)
Potassium: 3.7 mmol/L (ref 3.5–5.1)
Sodium: 135 mmol/L (ref 135–145)

## 2019-12-01 LAB — ECHOCARDIOGRAM COMPLETE
Area-P 1/2: 1.83 cm2
Height: 66 in
S' Lateral: 2.18 cm
Weight: 2857.16 oz

## 2019-12-01 LAB — TROPONIN I (HIGH SENSITIVITY)
Troponin I (High Sensitivity): 17 ng/L (ref <18)
Troponin I (High Sensitivity): 22 ng/L — ABNORMAL HIGH (ref <18)

## 2019-12-01 MED ORDER — RIVAROXABAN 10 MG PO TABS: 10.0000 mg | ORAL_TABLET | Freq: Every day | ORAL | Status: DC

## 2019-12-01 MED ORDER — SODIUM CHLORIDE 0.9 % IV BOLUS
500.0000 mL | Freq: Once | INTRAVENOUS | Status: AC
  Administered 2019-12-01: 500 mL via INTRAVENOUS

## 2019-12-01 MED ORDER — NITROGLYCERIN 2 % TD OINT
1.0000 [in_us] | TOPICAL_OINTMENT | Freq: Once | TRANSDERMAL | Status: AC
  Administered 2019-12-01: 1 [in_us] via TOPICAL
  Filled 2019-12-01: qty 1

## 2019-12-01 MED ORDER — ACETAMINOPHEN 650 MG RE SUPP: 650.0000 mg | Freq: Four times a day (QID) | RECTAL | Status: DC | PRN

## 2019-12-01 MED ORDER — VERAPAMIL HCL ER 180 MG PO TBCR
180.0000 mg | EXTENDED_RELEASE_TABLET | Freq: Every day | ORAL | Status: DC
  Administered 2019-12-01 – 2019-12-02 (×2): 180 mg via ORAL
  Filled 2019-12-01 (×2): qty 1

## 2019-12-01 MED ORDER — INSULIN GLARGINE 100 UNIT/ML ~~LOC~~ SOLN
10.0000 [IU] | Freq: Every day | SUBCUTANEOUS | Status: DC
  Administered 2019-12-01: 10 [IU] via SUBCUTANEOUS
  Filled 2019-12-01 (×2): qty 0.1

## 2019-12-01 MED ORDER — SODIUM CHLORIDE 0.9% FLUSH
3.0000 mL | Freq: Two times a day (BID) | INTRAVENOUS | Status: DC
  Administered 2019-12-01 (×2): 3 mL via INTRAVENOUS

## 2019-12-01 MED ORDER — METOPROLOL SUCCINATE ER 25 MG PO TB24
50.0000 mg | ORAL_TABLET | Freq: Every day | ORAL | Status: DC
  Administered 2019-12-01 – 2019-12-02 (×2): 50 mg via ORAL
  Filled 2019-12-01 (×2): qty 2

## 2019-12-01 MED ORDER — LOSARTAN POTASSIUM 50 MG PO TABS
25.0000 mg | ORAL_TABLET | Freq: Once | ORAL | Status: AC
  Administered 2019-12-01: 25 mg via ORAL
  Filled 2019-12-01: qty 1

## 2019-12-01 MED ORDER — INSULIN ASPART 100 UNIT/ML ~~LOC~~ SOLN
0.0000 [IU] | Freq: Three times a day (TID) | SUBCUTANEOUS | Status: DC
  Administered 2019-12-01: 5 [IU] via SUBCUTANEOUS
  Administered 2019-12-01: 3 [IU] via SUBCUTANEOUS
  Administered 2019-12-02 (×2): 5 [IU] via SUBCUTANEOUS

## 2019-12-01 MED ORDER — RIVAROXABAN 20 MG PO TABS
20.0000 mg | ORAL_TABLET | Freq: Every day | ORAL | Status: DC
  Administered 2019-12-01: 20 mg via ORAL
  Filled 2019-12-01: qty 1

## 2019-12-01 MED ORDER — ACETAMINOPHEN 325 MG PO TABS
650.0000 mg | ORAL_TABLET | Freq: Four times a day (QID) | ORAL | Status: DC | PRN
  Administered 2019-12-01: 650 mg via ORAL
  Filled 2019-12-01: qty 2

## 2019-12-01 NOTE — Discharge Instructions (Signed)

## 2019-12-01 NOTE — ED Notes (Signed)
Dr. Eudelia Bunch is at bedside.

## 2019-12-01 NOTE — H&P (Addendum)
Date: 12/01/2019               Patient Name:  [name] MRN: [id number]  DOB: 12/11/1970 Age / Sex: 49 y.o., female   PCP: Glenford Bayley, MD         Medical Service: Internal Medicine Teaching Service         Attending Physician: Dr. Sandre Kitty Elwin Mocha, MD    First Contact: Dr. Marijo Conception Pager: [phone]  Second Contact: Dr. Mcarthur Rossetti Pager: [phone]       After Hours (After 5p/  First Contact Pager: [phone]  weekends / holidays): Second Contact Pager: [phone]   Chief Complaint: chest pain  History of Present Illness:   [name] is 49yo female with hypertension, hypertrophic obstructive cardiomyopathy, paroxysmal atrial fibrillation, type 2 diabetes mellitus presenting with sudden-onset chest pain. Patient endorses that she was in her usual state of health until around 10:00PM last night when she had sudden onset of sharp, substernal chest pain. She mentions the pain worsened with touch, but no change with movement. Describes the pain as non-radiating and associated with weakness and mild diaphoresis. Denies dyspnea, nausea/vomiting, abdominal pain, or leg swelling/pain. She notes that she took her xarelto and went to lay down in bed about an hour after onset. However, the chest pain persisted so she called EMS.  She was given aspirin and nitroglycerin via EMS, which resulted in relief of chest pain. Denies chest pain since being given these medications.  Of note, patient endorses compliance with her medications. She notes that she checks her blood pressure regularly and SBP is usually 120-140. She notes that she does not regularly check her CBGs at home but when she does, its usually ~170 or higher. Patient also reports having to do double work for both herself and her friend at work this past Monday. She mentions she was able to tolerate dinner last night, which included [place] and rice.   [facility] (PCP: Dr. Laddie Aquas). She is also seen by her cardiologist, Dr.  Eden Emms with [facility]. Last visit in February 2021. Last TTE in January 2021, which revealed calcified aortic valve, LVOT, EF 65-70%, trivial MR. Patient underwent cardiac MRI in April 2021 for further characterization of findings. This revealed EF 79%, LVOT, mild MR, no lesions on aortic valve.   In ED, patient hypertensive at 171/81. ECG without significant changes from previous in May 2021. Labs notable for hyperglycemia, increased creatinine. Troponin 17 > 22. IMTS consulted for admission for chest pain, ACS rule-out.   Past Medical History:  Diagnosis Date  . Bell's palsy 03/2017  . Gallstones   . Gestational diabetes mellitus (GDM) 2005; 2006  . Headache    "monthly" (03/12/2017)  . History of chest pain   . History of prolonged Q-T interval on ECG    a. 09/2012 adm: QTC 506.  Marland Kitchen Hypertension   . PAF (paroxysmal atrial fibrillation) (HCC) 2012   a. 07/2011 ED visit with AF-> converted with flecainide, never followed-up;  b. 05/2012 Recurrent Afib -echo: EF 65-60%, mildly dil LA. Placed on Xarelto/daily Flecainide. c. 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  . Small bowel obstruction (HCC) 09/2010   resolved  . Type II diabetes mellitus (HCC)    a. 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 b. 05/2012 A1c 12.6.   Meds:  No outpatient medications have been marked as taking for the 12/01/19 encounter [facility] Encounter).   Allergies: Allergies as  of 12/01/2019  . (No Known Allergies)   Family History:  Family History  Problem Relation Age of Onset  . Other Father        killed in [place] civil war when he was in his 91's  . Asthma Mother        alive in her 70's  . Other Sister        A & W  . Other Sister        A & W  . Heart disease Neg Hx    Social History:  Patient is originally from [place] and lives in [place] for past 17 years. Recent travel to [place], returned in July. She lives with her two children, 73 and 63 years old. Patient works as  Stage manager at [employer]. Denies any history of alcohol use, tobacco use, or illicit substance use.  Review of Systems: A complete ROS was negative except as per HPI.   Physical Exam: Blood pressure (!) 158/93, pulse 82, temperature 98.5 F (36.9 C), temperature source Oral, resp. rate 17, height 5\' 6"  (1.676 m), weight 81 kg, last menstrual period 10/01/2006, SpO2 96 %. Physical Exam Constitutional:      General: She is awake. She is not in acute distress.    Appearance: Normal appearance. She is not ill-appearing or diaphoretic.  HENT:     Head: Normocephalic and atraumatic.  Eyes:     General: Lids are normal. No scleral icterus. Cardiovascular:     Rate and Rhythm: Normal rate and regular rhythm.     Pulses: Normal pulses.     Heart sounds: Murmur heard.  Systolic murmur is present.   Pulmonary:     Effort: Pulmonary effort is normal.     Breath sounds: Normal breath sounds. No wheezing, rhonchi or rales.  Abdominal:     General: Bowel sounds are normal. There is no distension.     Palpations: Abdomen is soft.     Tenderness: There is no abdominal tenderness.  Musculoskeletal:     Right lower leg: No edema.     Left lower leg: No edema.  Skin:    General: Skin is warm and dry.     Capillary Refill: Capillary refill takes less than 2 seconds.     Coloration: Skin is not jaundiced.  Neurological:     General: No focal deficit present.     Mental Status: She is alert and oriented to person, place, and time. Mental status is at baseline.  Psychiatric:        Mood and Affect: Mood normal.        Speech: Speech normal.        Behavior: Behavior normal. Behavior is cooperative.        Cognition and Memory: Cognition normal.    EKG: personally reviewed my interpretation is normal sinus rhythm, right bundle branch block. No significant changes from 05/2019  CXR: personally reviewed my interpretation is borderline cardiomegaly  Assessment & Plan by Problem: [name] is 49yo  female (she/her) with hypertension, hypertrophic obstructive cardiomyopathy, paroxysmal atrial fibrillation, type 2 diabetes mellitus admitted 11/26 for ACS work-up due to sudden onset chest pain.  Active Problems:   Chest pain  #Atypical chest pain Patient presents with sudden-onset, sharp, non-radiating, midsternal chest pain last night. Pain associated with mild weakness and diaphoresis, denies dyspnea/nausea/vomiting/leg swelling. Mentions pain worsens with palpation. Meal last night not typical for acid reflux and no reported history in the past. No obvious inciting event for MSK injury.  Pain relieved with aspirin, nitroglycerin via EMS. On arrival to [facility] patient hypertensive. EKG without significant changes from May. HEART score 4. Chest pain atypical, but moderate risk given associated weakness/diaphoresis, relief with nitroglycerin, and history of hypertension and HOCM. Chest pain could be 2/2 hypertensive urgency as well. Will obtain Echo for further evaluation. If concerns for ischemia on TTE or recurrent chest pain requiring more nitroglycerin, will contact cardiology for more intensive evaluation. - F/u TTE - Cards consult pending TTE results - Nitroglycerin PRN - F/u lipid panel - Tele  #Hypertension On arrival, patient hypertensive to 171/81. Patient mentions SBP usually 120-140. Home regimen includes metoprolol succinate 50mg  qd, verapamil 180mg  qd, losartan 25mg  qd. Mentions she has been compliant with her medications. Given her slight increase in creatinine, will hold losartan and continue to monitor. - C/w home metoprolol, verapamil  #Hyperglycemia #Type 2 diabetes mellitus Last A1c 11.1 in June 2021. Mentions she is compliant with home metformin, levemir 10u QHS. Will repeat A1c while inpatient and adjust insulin as needed.  - F/u A1c - C/w Lantus 10u QHS - SSI - CBG monitoring  #Acute kidney injury Previous lab work reveals baseline sCr 0.8-0.9. On arrival, sCr 1.15. No  recent change in patient's po intake. No urinary symptoms present, most likely pre-renal etiology. Will give gentle hydration considering hx of HOCM. - Normal saline bolus - Trend RFP - Avoid nephrotoxic medications - I&O's - Hold losartan  #Paroxysmal atrial fibrillation CHADS-VASc 3. Patient mentions she sometimes has palpitations, but denies any currently or since onset of chest pain. No obvious signs of bleeding. EKG in normal sinus rhythm. - C/w xarelto 20mg  qd  DIET: CM IVF: none DVT PPX: Home xarelto CODE: FULL FAM COM: Mentions if decompensation occurs, would like to call her children's father, [phone]).   Dispo: Admit patient to Observation with expected length of stay less than 2 midnights.  Signed: , MD 12/01/2019, 6:52 AM  Pager: [phone] After 5pm on weekdays and 1pm on weekends: On Call pager: [phone]

## 2019-12-01 NOTE — Plan of Care (Signed)
  Problem: Education: Goal: Knowledge of General Education information will improve Description: Including pain rating scale, medication(s)/side effects and non-pharmacologic comfort measures Outcome: Progressing   Problem: Clinical Measurements: Goal: Will remain free from infection Outcome: Progressing Goal: Respiratory complications will improve Outcome: Progressing   

## 2019-12-01 NOTE — Progress Notes (Signed)
  Echocardiogram 2D Echocardiogram has been performed.  [name] 12/01/2019, 9:42 AM

## 2019-12-01 NOTE — ED Triage Notes (Signed)
EMS reports that pt started having chest pain around 10pm. Described it as sharp and radiating into neck. Takes xarelto for A fib. Gave ASA 324mg  PO and Niitro x 1.

## 2019-12-01 NOTE — ED Provider Notes (Signed)
[facility] EMERGENCY DEPARTMENT Provider Note  CSN: [id number] Arrival date & time: 12/01/19 0310  Chief Complaint(s) No chief complaint on file.  HPI [name] is a 48 y.o. female who presented to the emergency department with substernal chest pain that began around 10 PM last night.  She reports that the pain was nonradiating and nonexertional.  Persisted for several hours prompting her to call EMS.  Pain started improving after she received medication from EMS which was aspirin and nitroglycerin.  She endorses mild shortness of breath with the pain which is now resolved.  She also endorsed mild epigastric abdominal discomfort which has also resolved.  She denies any recent fevers or infections.  No coughing or congestion.  No nausea or vomiting.  No diarrhea.  No other physical complaints.  Patient reported that she has been compliant with her medications.  While going through her blood pressure medications she revealed that her losartan ran out 2 months ago and she has not refilled it.  Patient does have a history of A. fib on Xarelto.  She recently had work-up and was diagnosed with HOCM.  HPI  Past Medical History Past Medical History:  Diagnosis Date  . Bell's palsy 03/2017  . Gallstones   . Gestational diabetes mellitus (GDM) 2005; 2006  . Headache    "monthly" (03/12/2017)  . History of chest pain   . History of prolonged Q-T interval on ECG    a. 09/2012 adm: QTC 506.  Marland Kitchen Hypertension   . PAF (paroxysmal atrial fibrillation) ([place]) 2012   a. 07/2011 ED visit with AF-> converted with flecainide, never followed-up;  b. 05/2012 Recurrent Afib -echo: EF 65-60%, mildly dil LA. Placed on Xarelto/daily Flecainide. c. 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  . Small bowel obstruction ([place]) 09/2010   resolved  . Type II diabetes mellitus ([place])    a. 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 b. 05/2012 A1c 12.6.   Patient Active Problem  List   Diagnosis Date Noted  . Chest pain 12/01/2019  . Counseling for travel 05/22/2019  . History of COVID-19 11/2018 11/28/2018  . Back pain 04/04/2018  . HOCM (hypertrophic obstructive cardiomyopathy) ([place]) 04/03/2017  . Bell's palsy 03/11/2017  . Healthcare maintenance 01/17/2017  . Murmur 03/07/2014  . Atrial fibrillation ([place]) 05/23/2012  . Essential hypertension 05/23/2012  . Diabetes mellitus ([place]) 05/23/2012   Home Medication(s) Prior to Admission medications   Medication Sig Start Date End Date Taking? Authorizing Provider  blood glucose meter kit and supplies KIT Dispense based on patient and insurance preference. Use up to four times daily as directed. (FOR ICD-9 250.00, 250.01). Patient taking differently: 1 each by Other route See admin instructions. Dispense based on patient and insurance preference. Use up to four times daily as directed. (FOR ICD-9 250.00, 250.01). 06/14/18   Kathi Ludwig, MD  doxycycline (VIBRA-TABS) 100 MG tablet Take 1 tablet (100 mg total) by mouth daily. Start on June 1st. 05/29/19   Welford Roche, MD  gabapentin (NEURONTIN) 100 MG capsule Take 1 capsule (100 mg total) by mouth 2 (two) times daily. 11/28/18   Santos-Sanchez, Merlene Morse, MD  glucose blood (ONETOUCH VERIO) test strip Use 3x a day Patient taking differently: 1 each by Other route in the morning, at noon, and at bedtime.  11/28/18   Welford Roche, MD  HYDROcodone-acetaminophen (NORCO) 5-325 MG tablet Take 1 tablet by mouth every 4 (four) hours as needed for severe pain. 04/23/19  Sherwood Gambler, MD  Insulin Detemir (LEVEMIR) 100 UNIT/ML Pen Inject 10 Units into the skin daily. Patient taking differently: Inject 10 Units into the skin at bedtime.  11/28/18   Santos-Sanchez, Merlene Morse, MD  Insulin Pen Needle (PEN NEEDLES) 32G X 4 MM MISC 1 each by Does not apply route daily. 11/28/18   Santos-Sanchez, Merlene Morse, MD  Lancets [facility] DELICA PLUS [id number]) MISC 1 each by Does  not apply route 3 (three) times daily. 11/28/18   Santos-Sanchez, Merlene Morse, MD  losartan (COZAAR) 25 MG tablet Take 1 tablet (25 mg total) by mouth daily. 05/29/19   Santos-Sanchez, Merlene Morse, MD  metFORMIN (GLUCOPHAGE) 1000 MG tablet TAKE 1 TABLET BY MOUTH 2 TIMES DAILY WITH A MEAL. Patient taking differently: Take 1,000 mg by mouth 2 (two) times daily with a meal.  06/06/19   Earlene Plater, MD  metoprolol succinate (TOPROL-XL) 50 MG 24 hr tablet TAKE 1 TABLET BY MOUTH ONCE A DAY WITH OR IMMEDIATELY FOLLOWING A MEAL Patient taking differently: Take 50 mg by mouth daily.  06/06/19   Earlene Plater, MD  rivaroxaban (XARELTO) 20 MG TABS tablet TAKE 1 TABLET BY MOUTH ONCE A DAY WITH SUPPER Patient taking differently: Take 20 mg by mouth daily with supper.  06/06/19   Earlene Plater, MD  verapamil (CALAN-SR) 180 MG CR tablet Take 1 tablet (180 mg total) by mouth daily. 06/06/19   Earlene Plater, MD                                                                                                                                    Past Surgical History Past Surgical History:  Procedure Laterality Date  . CESAREAN SECTION  2005; 2006  . CHOLECYSTECTOMY N/A 03/25/2017   Procedure: LAPAROSCOPIC CHOLECYSTECTOMY;  Surgeon: Kinsinger, Arta Bruce, MD;  Location: [facility];  Service: General;  Laterality: N/A;  . HERNIA REPAIR    . VENTRAL HERNIA REPAIR  02/05/2005   with mesh   Family History Family History  Problem Relation Age of Onset  . Other Father        killed in [place] civil war when he was in his 74's  . Asthma Mother        alive in her 83's  . Other Sister        A & W  . Other Sister        A & W  . Heart disease Neg Hx     Social History Social History   Tobacco Use  . Smoking status: Never Smoker  . Smokeless tobacco: Never Used  Vaping Use  . Vaping Use: Never used  Substance Use Topics  . Alcohol use: Never  . Drug use: Never   Allergies Patient has no  known allergies.  Review of Systems Review of Systems All other systems are reviewed and are negative for acute change except as noted in the HPI  Physical Exam  Vital Signs  I have reviewed the triage vital signs BP (!) 215/96 (BP Location: Right Arm)   Pulse 79   Resp 20   Ht _0  (1.676 m)   Wt 81 kg   LMP 10/01/2006   SpO2 100%   BMI 28.82 kg/m   Physical Exam Vitals reviewed.  Constitutional:      General: She is not in acute distress.    Appearance: She is well-developed. She is not diaphoretic.  HENT:     Head: Normocephalic and atraumatic.     Nose: Nose normal.  Eyes:     General: No scleral icterus.       Right eye: No discharge.        Left eye: No discharge.     Conjunctiva/sclera: Conjunctivae normal.     Pupils: Pupils are equal, round, and reactive to light.  Cardiovascular:     Rate and Rhythm: Normal rate and regular rhythm.     Heart sounds: No murmur heard.  No friction rub. No gallop.   Pulmonary:     Effort: Pulmonary effort is normal. No respiratory distress.     Breath sounds: Normal breath sounds. No stridor. No rales.  Abdominal:     General: There is no distension.     Palpations: Abdomen is soft.     Tenderness: There is no abdominal tenderness.  Musculoskeletal:        General: No tenderness.     Cervical back: Normal range of motion and neck supple.  Skin:    General: Skin is warm and dry.     Findings: No erythema or rash.  Neurological:     Mental Status: She is alert and oriented to person, place, and time.     ED Results and Treatments Labs (all labs ordered are listed, but only abnormal results are displayed) Labs Reviewed  BASIC METABOLIC PANEL - Abnormal; Notable for the following components:      Result Value   Chloride 97 (*)    Glucose, Bld 293 (*)    Creatinine, Ser 1.15 (*)    GFR, Estimated 59 (*)    All other components within normal limits  CBC - Abnormal; Notable for the following components:   MCH 25.8  (*)    All other components within normal limits  TROPONIN I (HIGH SENSITIVITY) - Abnormal; Notable for the following components:   Troponin I (High Sensitivity) 22 (*)    All other components within normal limits  RESP PANEL BY RT-PCR (FLU A&B, COVID) ARPGX2  I-STAT BETA HCG BLOOD, ED ([facility], [facility], [facility] ONLY)  TROPONIN I (HIGH SENSITIVITY)                                                                                                                         EKG  EKG Interpretation  Date/Time:  Friday December 01 2019 03:12:51 EST Ventricular Rate:  79 PR Interval:    QRS Duration: 137 QT Interval:  444 QTC  Calculation: 509 R Axis:   104 Text Interpretation: Sinus rhythm Probable left atrial enlargement Right bundle branch block No significant change since last tracing Confirmed by Addison Lank [phone]) on 12/01/2019 4:05:07 AM      Radiology DG Chest 2 View  Result Date: 12/01/2019 CLINICAL DATA:  Chest pain radiating into the neck. History of atrial fibrillation. EXAM: CHEST - 2 VIEW COMPARISON:  05/12/2019 FINDINGS: Heart size and pulmonary vascularity are normal. Linear scarring in the lung bases. No airspace disease or consolidation. No pleural effusions. No pneumothorax. Mediastinal contours appear intact. IMPRESSION: No active cardiopulmonary disease. Electronically Signed   By: Lucienne Capers M.D.   On: 12/01/2019 04:16    Pertinent labs & imaging results that were available during my care of the patient were reviewed by me and considered in my medical decision making (see chart for details).  Medications Ordered in ED Medications  sodium chloride flush (NS) 0.9 % injection 3 mL (has no administration in time range)  acetaminophen (TYLENOL) tablet 650 mg (has no administration in time range)    Or  acetaminophen (TYLENOL) suppository 650 mg (has no administration in time range)  verapamil (CALAN-SR) CR tablet 180 mg (has no administration in time range)  rivaroxaban  (XARELTO) tablet 20 mg (has no administration in time range)  metoprolol succinate (TOPROL-XL) 24 hr tablet 50 mg (has no administration in time range)  losartan (COZAAR) tablet 25 mg (25 mg Oral Given 12/01/19 0437)  nitroGLYCERIN (NITROGLYN) 2 % ointment 1 inch (1 inch Topical Given 12/01/19 0437)                                                                                                                                    Procedures Procedures  (including critical care time)  Medical Decision Making / ED Course I have reviewed the nursing notes for this encounter and the patient's prior records (if available in EHR or on provided paperwork).   Leslieanne T Doxtater was evaluated in Emergency Department on 12/01/2019 for the symptoms described in the history of present illness. She was evaluated in the context of the global COVID-19 pandemic, which necessitated consideration that the patient might be at risk for infection with the SARS-CoV-2 virus that causes COVID-19. Institutional protocols and algorithms that pertain to the evaluation of patients at risk for COVID-19 are in a state of rapid change based on information released by regulatory bodies including the CDC and federal and state organizations. These policies and algorithms were followed during the patient's care in the ED.  Patient presented with atypical chest pain that improved with nitroglycerin.  She is noted to be hypertensive as well.  No acute distress currently. EKG without acute ischemic changes or evidence of pericarditis.  Initial troponin negative.  Heart score greater than 4 and delta troponin trending up.  Low suspicion for pulmonary embolism.  Presentation not classic for aortic dissection or esophageal perforation.  Chest x-ray without evidence suggestive of pneumonia, pneumothorax, pneumomediastinum.  No abnormal contour of the mediastinum to suggest dissection. No evidence of acute injuries.  Patient provided with  Nitropaste and home dose of losartan which improved her blood pressure and resolve her pain.  She was admitted to internal medicine for further work-up and management.      Final Clinical Impression(s) / ED Diagnoses Final diagnoses:  Precordial pain  Elevated troponin  Other secondary hypertension  Hyperglycemia      This chart was dictated using voice recognition software.  Despite best efforts to proofread,  errors can occur which can change the documentation meaning.   Fatima Blank, MD 12/01/19 [phone]

## 2019-12-02 DIAGNOSIS — R0789 Other chest pain: Secondary | ICD-10-CM

## 2019-12-02 LAB — CBC
HCT: 38.5 % (ref 36.0–46.0)
Hemoglobin: 12.4 g/dL (ref 12.0–15.0)
MCH: 25.8 pg — ABNORMAL LOW (ref 26.0–34.0)
MCHC: 32.2 g/dL (ref 30.0–36.0)
MCV: 80 fL (ref 80.0–100.0)
Platelets: 242 10*3/uL (ref 150–400)
RBC: 4.81 MIL/uL (ref 3.87–5.11)
RDW: 13.2 % (ref 11.5–15.5)
WBC: 11.2 10*3/uL — ABNORMAL HIGH (ref 4.0–10.5)
nRBC: 0 % (ref 0.0–0.2)

## 2019-12-02 LAB — COMPREHENSIVE METABOLIC PANEL
ALT: 31 U/L (ref 0–44)
AST: 28 U/L (ref 15–41)
Albumin: 3.5 g/dL (ref 3.5–5.0)
Alkaline Phosphatase: 88 U/L (ref 38–126)
Anion gap: 15 (ref 5–15)
BUN: 13 mg/dL (ref 6–20)
CO2: 24 mmol/L (ref 22–32)
Calcium: 9.1 mg/dL (ref 8.9–10.3)
Chloride: 98 mmol/L (ref 98–111)
Creatinine, Ser: 0.96 mg/dL (ref 0.44–1.00)
GFR, Estimated: >60 mL/min (ref >60)
Glucose, Bld: 195 mg/dL — ABNORMAL HIGH (ref 70–99)
Potassium: 3.7 mmol/L (ref 3.5–5.1)
Sodium: 137 mmol/L (ref 135–145)
Total Bilirubin: 0.5 mg/dL (ref 0.3–1.2)
Total Protein: 7.4 g/dL (ref 6.5–8.1)

## 2019-12-02 LAB — HEMOGLOBIN A1C
Hgb A1c MFr Bld: 12.4 % — ABNORMAL HIGH (ref 4.8–5.6)
Mean Plasma Glucose: 309.18 mg/dL

## 2019-12-02 LAB — LIPID PANEL
Cholesterol: 127 mg/dL (ref 0–200)
HDL: 55 mg/dL (ref >40)
LDL Cholesterol: 39 mg/dL (ref 0–99)
Total CHOL/HDL Ratio: 2.3 RATIO
Triglycerides: 164 mg/dL — ABNORMAL HIGH (ref <150)
VLDL: 33 mg/dL (ref 0–40)

## 2019-12-02 LAB — HIV ANTIBODY (ROUTINE TESTING W REFLEX): HIV Screen 4th Generation wRfx: NONREACTIVE

## 2019-12-02 LAB — GLUCOSE, CAPILLARY
Glucose-Capillary: 234 mg/dL — ABNORMAL HIGH (ref 70–99)
Glucose-Capillary: 206 mg/dL — ABNORMAL HIGH (ref 70–99)

## 2019-12-02 MED ORDER — LOSARTAN POTASSIUM 25 MG PO TABS
25.0000 mg | ORAL_TABLET | Freq: Every day | ORAL | Status: DC
  Administered 2019-12-02: 25 mg via ORAL
  Filled 2019-12-02: qty 1

## 2019-12-02 MED ORDER — LOSARTAN POTASSIUM 25 MG PO TABS: 25.0000 mg | ORAL_TABLET | Freq: Every day | ORAL | 0 refills | Status: DC

## 2019-12-02 MED ORDER — INSULIN DETEMIR 100 UNIT/ML FLEXPEN: 13.0000 [IU] | PEN_INJECTOR | Freq: Every day | SUBCUTANEOUS | 3 refills | Status: DC

## 2019-12-02 NOTE — Discharge Summary (Signed)
Name: [name] MRN: [id number] DOB: 10-Oct-1970 49 y.o. PCP: Jeralyn Bennett, MD  Date of Admission: 12/01/2019  3:10 AM Date of Discharge: 12/02/2019 Attending Physician: Oda Kilts, MD  Discharge Diagnosis: 1. Atypical chest pain 2/2 uncontrolled hypertension 2. Type 2 diabetes mellitus 3. Acute kidney injury  Discharge Medications: Allergies as of 12/02/2019   No Known Allergies     Medication List    STOP taking these medications   doxycycline 100 MG tablet Commonly known as: VIBRA-TABS   gabapentin 100 MG capsule Commonly known as: NEURONTIN   HYDROcodone-acetaminophen 5-325 MG tablet Commonly known as: Norco     TAKE these medications   blood glucose meter kit and supplies Kit Dispense based on patient and insurance preference. Use up to four times daily as directed. (FOR ICD-9 250.00, 250.01). What changed:   how much to take  how to take this  when to take this   insulin detemir 100 UNIT/ML FlexPen Commonly known as: LEVEMIR Inject 13 Units into the skin at bedtime. What changed:   how much to take  when to take this   losartan 25 MG tablet Commonly known as: COZAAR Take 1 tablet (25 mg total) by mouth daily.   metFORMIN 1000 MG tablet Commonly known as: GLUCOPHAGE TAKE 1 TABLET BY MOUTH 2 TIMES DAILY WITH A MEAL. What changed:   how much to take  how to take this  when to take this  additional instructions   metoprolol succinate 50 MG 24 hr tablet Commonly known as: TOPROL-XL TAKE 1 TABLET BY MOUTH ONCE A DAY WITH OR IMMEDIATELY FOLLOWING A MEAL What changed:   how much to take  how to take this  when to take this  additional instructions   OneTouch Delica Plus [id number] Misc 1 each by Does not apply route 3 (three) times daily.   OneTouch Verio test strip Generic drug: glucose blood Use 3x a day What changed:   how much to take  how to take this  when to take this  additional instructions     Pen Needles 32G X 4 MM Misc 1 each by Does not apply route daily.   rivaroxaban 20 MG Tabs tablet Commonly known as: Xarelto TAKE 1 TABLET BY MOUTH ONCE A DAY WITH SUPPER What changed:   how much to take  how to take this  when to take this  additional instructions   verapamil 180 MG CR tablet Commonly known as: CALAN-SR Take 1 tablet (180 mg total) by mouth daily.      Disposition and follow-up:   [name] was discharged from [facility] in Stable condition.  At the hospital follow up visit please address:  1. Atypical chest pain 2/2 uncontrolled hypertension: CP relieved w/ NGx1. Echo w/o RWMAs. Patient has not taken losartan 71m for last two months. D/c with instructions to continue metoprolol, verapamil, and re-start losartan. Patient has previously scheduled appointment with cardiologist on 12/11/19.  2. Type 2 diabetes mellitus: A1c 12.4. Increased Levemir to 13u QHS at discharge. Will likely need further titration and meal coverage moving forward. Could benefit from appointment with diabetes educator.  3. Acute kidney injury: sCr 1.15 > 0.9 s/p IVF. Back to baseline at discharge.  4.  Labs / imaging needed at time of follow-up: CBC, BMP  5.  Pending labs/ test needing follow-up: n/a  Follow-up Appointments:  Follow-up Information    SJeralyn Bennett MD. Call on 12/04/2019.   Specialty: Internal  Medicine Why: Call on Monday, November 29th to make a hospital follow-up appointment next week. The number is [phone]. Contact information: [place] [address] [place] Hospital Course by problem list: 1. Atypical chest pain 2/2 uncontrolled hypertension: Patient experienced sharp, sudden-onset, mid-sternal, non-radiating chest pain that worsened with palpation, associated with weakness and diaphoresis. After an hour of CP, patient called EMS. Patient given ASA, NG via EMS with resolution of chest  pain. In ED, trop 18>22, SBP 171. TTE without signs of ischemia. Most likely 2/2 hypertension. Discharged patient with instructions to continue metoprolol, verapamil, and re-start losartan. Patient will [facility] with [facility] next week and cardiology on 12/11/19.  2. Type 2 diabetes mellitus: A1c 12.4 on arrival. Patient taking 1089m metformin BID, levemir 10u QHS. Throughout stay, CBG's elevated in 200's. Likely needs increase in basal insulin, possibly mealtime coverage as well.  3. Acute kidney injury: On arrival, sCr 1.15. Patient reports recent poor po intake. sCr improved to 0.9 after bolus of IVF. Will re-check kidney function at [facility] clinic next week.  Discharge Vitals:   BP (!) 171/99 (BP Location: Right Arm)   Pulse 73   Temp 98.8 F (37.1 C) (Oral)   Resp 19   Ht 5' 6"  (1.676 m)   Wt 81 kg   LMP 10/01/2006   SpO2 98%   BMI 28.82 kg/m   Pertinent Labs, Studies, and Procedures:  CBC Latest Ref Rng & Units 12/02/2019 12/01/2019 05/12/2019  WBC 4.0 - 10.5 K/uL 11.2(H) 7.9 8.2  Hemoglobin 12.0 - 15.0 g/dL 12.4 12.3 12.8  Hematocrit 36 - 46 % 38.5 38.9 40.6  Platelets 150 - 400 K/uL 242 233 260   BMP Latest Ref Rng & Units 12/02/2019 12/01/2019 05/12/2019  Glucose 70 - 99 mg/dL 195(H) 293(H) 151(H)  BUN 6 - 20 mg/dL 13 17 11   Creatinine 0.44 - 1.00 mg/dL 0.96 1.15(H) 0.79  BUN/Creat Ratio 9 - 23 - - -  Sodium 135 - 145 mmol/L 137 135 138  Potassium 3.5 - 5.1 mmol/L 3.7 3.7 3.4(L)  Chloride 98 - 111 mmol/L 98 97(L) 96(L)  CO2 22 - 32 mmol/L 24 24 27   Calcium 8.9 - 10.3 mg/dL 9.1 9.4 9.2   Lipid Panel     Component Value Date/Time   CHOL 127 12/02/2019 0104   CHOL 109 01/15/2017 1645   TRIG 164 (H) 12/02/2019 0104   HDL 55 12/02/2019 0104   HDL 51 01/15/2017 1645   CHOLHDL 2.3 12/02/2019 0104   VLDL 33 12/02/2019 0104   LDLCALC 39 12/02/2019 0104   LDLCALC 17 01/15/2017 1645   LABVLDL 41 (H) 01/15/2017 1645   TTE 12/01/19: 1. There is a very small LV outflow gradient  at rest (14 mm Hg), there is also and end-systolic "intracavitary gradient" of 16 mmHg, due to mid-cavity obliteration.. Left ventricular ejection fraction, by estimation, is 70 to 75%. The left ventricle has hyperdynamic function. The left ventricle has no regional wall motion abnormalities. There is mild asymmetric left ventricular hypertrophy of the basal-septal segment. Left ventricular diastolic parameters are consistent with Grade I diastolic  dysfunction (impaired relaxation). Elevated left atrial pressure.  2. Right ventricular systolic function is hyperdynamic. The right ventricular size is normal. Tricuspid regurgitation signal is inadequate for assessing PA pressure.  3. Left atrial size was mildly dilated.  4. There is only very mild chordal systolic anterior motion, with minimal LV  outflow obstruction. The basal septum endocardium and the corresponing chordae to the anterio leaflet appear hyperechogenic (thickening and possible mild calcification). Similar changes are seen at the tip of the anterolateral papillary muscle. There is substantial mitral annulus calcification, unexpected for age. The mitral valve is normal in structure. No evidence of mitral valve regurgitation.  5. Aortic valve regurgitation is not visualized. Mild aortic valve sclerosis is present, with no evidence of aortic valve stenosis. The changes are most prominent on the left coronary cusp, but a distinct mass is not appreciated.   A1c 12/02/19: 12.4  Discharge Instructions:  Ms. Strang,  I am glad you are feeling better! You were admitted for chest pain work-up. During your time at the hospital, we took an ultrasound of your heart. Thankfully, there were no signs of artery blockages. We believe your chest pain is due to high blood pressure. Please see the following notes:  - Continue taking verapamil (CALAN-SR) 148m once daily and metoprolol succinate (TOPROL-XL) 528monce daily.  - We have sent a prescription in  for losartan (COZAAR) 2582mnce daily as well for your blood pressure. Please make sure to continue taking this.  - In addition, we found your A1c to be very high at 12.4. Please continue taking your metformin (GLUCOPHAGE) 1000m41mice daily.   - We would also like you to INCREASE your insulin (detemir/levemir). We would like you to take 13 units nightly for better control of your sugars.  - Insulin can cause you to have low sugars, so make sure to check your sugars regularly. Signs of low sugars include sweating, fatigue, dizziness, nausea, and vomiting. If you experience these symptoms, check your sugar levels and drink a sugary drink (such as fruit juice) and re-check again in about 10 minutes. We do not want your sugars to drop below 70.   - Please call the clinic Monday to schedule an appointment next week. The phone number is [phone] was a pleasure meeting you, Ms. CheaDavidsonwish you the best and hope you have a safe, happy, and healthy holiday season!  Thank you, PhilSanjuan Dame  Signed: BrasSanjuan Dame 12/02/2019, 10:09 AM   Pager: [phone]

## 2019-12-02 NOTE — Progress Notes (Addendum)
   Subjective:  Patient evaluated at bedside this morning. She denies any further episode of chest pain, shortness of breath, abdominal pain or nausea/vomiting. Discussed Echo results. Discussed that her diabetes is uncontrolled. Recommended to increase her basal insulin at this time and follow up in the [facility] early next week to continue adjusting her insulin dosing. Patient expresses understanding.   Objective:  Vital signs in last 24 hours: Vitals:   12/01/19 1016 12/01/19 1830 12/01/19 2310 12/02/19 0525  BP: (!) 150/83 (!) 158/89 139/78 (!) 171/99  Pulse: 80 72 73 73  Resp: 18 19 18 19   Temp: 98.9 F (37.2 C) 97.8 F (36.6 C) 98.9 F (37.2 C) 98.8 F (37.1 C)  TempSrc: Oral Oral Oral Oral  SpO2: 98% 98% 98% 98%  Weight:      Height:       Physical Exam: General: Pleasant, sitting up in bed, no acute distress Pulm: Normal effort, no increased work of breathing Neuro: Awake, alert, oriented x4. Moving all four extremities appropriately.  Assessment/Plan: Ms. Woo is 49yo female (she/her) with hypertension, hypertrophic obstructive cardiomyopathy, paroxysmal atrial fibrillation, type 2 diabetes mellitus admitted 11/26 for atypical chest pain, most likely 2/2 uncontrolled hypertension.  Active Problems:   Chest pain  #Atypical chest pain #Uncontrolled hypertension TTE yesterday revealed no regional wall motion abnormalities, LVOT, EF 70-75%, grade I diastolic dysfunction. Since receiving ASA, NG via EMS yesterday, patient has not had any chest pain or dyspnea. Chest pain believed to be most likely 2/2 uncontrolled hypertension.  This AM BP 171/99 on home metoprolol, verapamil. She reports she has not had her losartan in 2 months. Plan for discharge today with losartan, verapamil, and metoprolol. Patient to follow-up in [facility] next week and with cardiologist on 12/6. - D/c with losartan 25mg  qd, metoprolol succinate 50mg  qd, verapamil 160mg  qd - Lipid panel: HDL 55, LDL 35 -  F/u with [facility] - Cardiology appointment 12/11/19  #Hyperglycemia #Type 2 diabetes mellitus A1c during this admission 12.4 (previously 11.4). Patient's current regimen includes metformin 1000mg  BID, Lantus 10u QHS. Will have patient increase Lantus to 15u QHS and continue with metformin. Discussed with patient risks of uncontrolled diabetes, including heart attacks, strokes, and peripheral arterial disease. Patient to follow-up with [facility]. - Increase home Levemir 15u - C/w home metformin  #Acute kidney injury, resolved sCr 1.15 > 0.90 s/p IVF bolus yesterday. Patient not hypervolemic today, breathing well on room air. Will re-check RFP in clinic next week. - F/u RFP next week in clinic  DIET: CM IVF: n/a DVT PPX: home xarelto CODE: FULL FAM COM: Patient to be discharged today.  Prior to Admission Living Arrangement: Home Anticipated Discharge Location: Home Barriers to Discharge: none Dispo: Anticipated discharge in approximately 0 day(s).   , MD 12/02/2019, 6:13 AM Pager: [phone] After 5pm on weekdays and 1pm on weekends: On Call pager [phone]

## 2019-12-07 ENCOUNTER — Ambulatory Visit (INDEPENDENT_AMBULATORY_CARE_PROVIDER_SITE_OTHER): Payer: BC Managed Care – PPO | Admitting: Internal Medicine

## 2019-12-07 ENCOUNTER — Encounter: Payer: Self-pay | Admitting: Internal Medicine

## 2019-12-07 ENCOUNTER — Other Ambulatory Visit: Payer: Self-pay

## 2019-12-07 VITALS — BP 183/82 | HR 73 | Temp 98.6°F | Ht 65.0 in | Wt 181.2 lb

## 2019-12-07 DIAGNOSIS — R079 Chest pain, unspecified: Secondary | ICD-10-CM

## 2019-12-07 DIAGNOSIS — E119 Type 2 diabetes mellitus without complications: Secondary | ICD-10-CM

## 2019-12-07 DIAGNOSIS — I10 Essential (primary) hypertension: Secondary | ICD-10-CM

## 2019-12-07 DIAGNOSIS — Z23 Encounter for immunization: Secondary | ICD-10-CM | POA: Diagnosis not present

## 2019-12-07 NOTE — Assessment & Plan Note (Signed)
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  During her recent admission from December 01, 2019 to December 02, 2019, her symptoms improved with nitroglycerin.  Further cardiac work-up was unremarkable and she was asked to follow-up with cardiology.  She has an appointment with cardiology on Monday number 06/25/2019

## 2019-12-07 NOTE — Progress Notes (Signed)
CARDIOLOGY OFFICE NOTE  Date:  12/11/2019    [name] Date of Birth: Jul 29, 1970 Medical Record #[id number]  PCP:  Jeralyn Bennett, MD  Cardiologist:  Johnsie Cancel  No chief complaint on file.   History of Present Illness:  49 y.o. f/u for HOCM. Has had PAF and HTN.    TTE 01/11/19 with septal thickness 17 mm , 20 mm resting LVOT gradient and trivial MR  She is to have cardiac MRI next week to further risk stratify and take a closer look at AV which had ? Calcified [name] like lesion on it by echo  Cardiac MRI April 2021 with septal thickness 17 mm mild hyper enhancement in basal anterior septum    She has had a normal myovue 03/23/14 with no history of CAD  Has 15/16   yo kids at home They are in 10 th and 11 th grade at [employer] Working at [employer] keeping No dyspnea, syncope or chest pain Compliant with meds   Repeat BP by me today 130/80 mmHg  No dyspnea, syncope, palpitations or chest pain   Past Medical History:  Diagnosis Date  . Bell's palsy 03/2017  . Gallstones   . Gestational diabetes mellitus (GDM) 2005; 2006  . Headache    "monthly" (03/12/2017)  . History of chest pain   . History of prolonged Q-T interval on ECG    a. 09/2012 adm: QTC 506.  Marland Kitchen Hypertension   . PAF (paroxysmal atrial fibrillation) ([place]) 2012   a. 07/2011 ED visit with AF-> converted with flecainide, never followed-up;  b. 05/2012 Recurrent Afib -echo: EF 65-60%, mildly dil LA. Placed on Xarelto/daily Flecainide. c. 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  . Small bowel obstruction ([place]) 09/2010   resolved  . Type II diabetes mellitus ([place])    a. 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 b. 05/2012 A1c 12.6.    Past Surgical History:  Procedure Laterality Date  . CESAREAN SECTION  2005; 2006  . CHOLECYSTECTOMY N/A 03/25/2017   Procedure: LAPAROSCOPIC CHOLECYSTECTOMY;  Surgeon: Kinsinger, Arta Bruce, MD;  Location: [facility];  Service:  General;  Laterality: N/A;  . HERNIA REPAIR    . VENTRAL HERNIA REPAIR  02/05/2005   with mesh     Medications: Current Meds  Medication Sig  . blood glucose meter kit and supplies KIT Dispense based on patient and insurance preference. Use up to four times daily as directed. (FOR ICD-9 250.00, 250.01).  Marland Kitchen glucose blood (ONETOUCH VERIO) test strip Use 3x a day  . insulin detemir (LEVEMIR) 100 UNIT/ML FlexPen Inject 13 Units into the skin at bedtime.  . Insulin Pen Needle (PEN NEEDLES) 32G X 4 MM MISC 1 each by Does not apply route daily.  . Lancets (ONETOUCH DELICA PLUS [id number]) MISC 1 each by Does not apply route 3 (three) times daily.  Marland Kitchen losartan (COZAAR) 25 MG tablet Take 1 tablet (25 mg total) by mouth daily.  . metFORMIN (GLUCOPHAGE) 1000 MG tablet TAKE 1 TABLET BY MOUTH 2 TIMES DAILY WITH A MEAL.  . metoprolol succinate (TOPROL-XL) 50 MG 24 hr tablet TAKE 1 TABLET BY MOUTH ONCE A DAY WITH OR IMMEDIATELY FOLLOWING A MEAL  . rivaroxaban (XARELTO) 20 MG TABS tablet TAKE 1 TABLET BY MOUTH ONCE A DAY WITH SUPPER  . verapamil (CALAN-SR) 180 MG CR tablet Take 1 tablet (180 mg total) by mouth daily.     Allergies: No Known Allergies  Social History: The patient  reports that she has never smoked. She has never used smokeless tobacco. She reports that she does not drink alcohol and does not use drugs.   Family History: The patient's family history includes Asthma in her mother; Other in her father, sister, and sister.   Review of Systems: Please see the history of present illness.   All other systems are reviewed and negative.   Physical Exam: VS:  BP (!) 154/86   Pulse 78   Ht 5' 5"  (1.651 m)   Wt 82.1 kg   LMP 10/01/2006   SpO2 97%   BMI 30.12 kg/m  .  BMI Body mass index is 30.12 kg/m.  Wt Readings from Last 3 Encounters:  12/11/19 82.1 kg  12/07/19 82.2 kg  12/01/19 81 kg    Affect appropriate Healthy:  appears stated age 24: normal Neck supple with no  adenopathy JVP normal no bruits no thyromegaly Lungs clear with no wheezing and good diaphragmatic motion Heart:  S1/S2 SEM worse with valsalva , no rub, gallop or click PMI normal Abdomen: benighn, BS positve, no tenderness, no AAA no bruit.  No HSM or HJR Distal pulses intact with no bruits No edema Neuro non-focal Skin warm and dry No muscular weakness    LABORATORY DATA:  EKG:  EKG is ordered today. This shows NSR - HR is 77 with RBBB.   Lab Results  Component Value Date   WBC 11.2 (H) 12/02/2019   HGB 12.4 12/02/2019   HCT 38.5 12/02/2019   PLT 242 12/02/2019   GLUCOSE 243 (H) 12/07/2019   CHOL 127 12/02/2019   TRIG 164 (H) 12/02/2019   HDL 55 12/02/2019   LDLCALC 39 12/02/2019   ALT 31 12/02/2019   AST 28 12/02/2019   NA 136 12/07/2019   K 4.3 12/07/2019   CL 94 (L) 12/07/2019   CREATININE 0.82 12/07/2019   BUN 11 12/07/2019   CO2 23 12/07/2019   TSH 1.116 03/11/2017   INR 1.07 12/29/2012   HGBA1C 12.4 (H) 12/02/2019     BNP (last 3 results) No results for input(s): BNP in the last 8760 hours.  ProBNP (last 3 results) No results for input(s): PROBNP in the last 8760 hours.   Other Studies Reviewed Today:  Echo 01/11/19 reviewed IMPRESSIONS    1. A small mass (0.7 cm x 0.5 cm) is now attached to the LCC of the AoV.  This was not present on the prior study (04/02/2017). This likely  represents calcium on the leaflet but cannot exclude papillary  fibroelastoma. Vegetation less likely as no  destruction of the valve. Consider a TEE for further characterization.  2. There is severe asymmetric hypertrophy of the basal septum up to 1.7  cm consistent with prior history of HOCM (sigmoid septum variant). The  LVOT gradient is up to 20 mmHG at heart rate ~70. No valsalva performed.  Trivial MR. There is minimal SAM.  Gradients and MR are much improved from 04/02/2017.  3. Left ventricular ejection fraction, by visual estimation, is 65 to  70%. The left  ventricle has normal function. There is mildly increased  left ventricular hypertrophy.  4. The left ventricle has no regional wall motion abnormalities.  5. Global right ventricle has normal systolic function.The right  ventricular size is normal. No increase in right ventricular wall  thickness.  6. The mitral valve is degenerative. Trivial mitral valve regurgitation.  7. The tricuspid valve is grossly normal.  8. The aortic  valve is tricuspid. Aortic valve regurgitation is not  visualized. No evidence of aortic valve sclerosis or stenosis.  9. The pulmonic valve was grossly normal. Pulmonic valve regurgitation is  not visualized.  10. Left ventricular diastolic parameters are consistent with Grade I  diastolic dysfunction (impaired relaxation).  11. TR signal is inadequate for assessing pulmonary artery systolic  pressure.  12. Changes from prior study are noted.  13. A prior study was performed on 04/02/2017.   Assessment/Plan:  1. HTN - labile verapamil increased 11/2018   - encouraged to try and restrict salt better. Would avoid diuretics.BP running ok at home   2. PAF :  Maintaining NSR at this time on xarelto  3. HOCM - LVOT gradient rest by TTE 01/11/19 20 mmHg trivial MR MRI done 04/17/19 confirms HOCM with septal thickness 17 mm  Basal septum hyper-enhancement continue beta blocker and verapamil   4. Hemoptysis:  - 11/2018 ENT CXR with NAD   5. Chronic anticoagulation - stable no further bleeding issues    Disposition:   FU in a year    Signed: Jenkins Rouge, MD  12/11/2019 4:04 PM  [facility] [address] [place], [place]  [address] Phone: [phone] Fax: [phone]

## 2019-12-07 NOTE — Assessment & Plan Note (Addendum)
Type 2 diabetes mellitus: Uncontrolled.  A1c recently was 12.4%.  She was asked to increase Levemir to 13 units daily  Plan: -Continue Metformin 1000 mg twice daily, Levemir 13 units daily -Return to clinic in 1 week with glucose monitor kit

## 2019-12-07 NOTE — Progress Notes (Signed)
   CC: Follow-up hypertension, recent hospital visit  HPI:  [name] is a 49 y.o. with medical history listed below presented to follow-up on chronic medical problems.  She was recently admitted to the hospital from December 01, 2019 to December 02, 2019 with atypical chest pain which relieved with nitroglycerin.  She was found to have severely elevated blood pressure in the 200s/90s.  Further cardiac work-up was unremarkable.  Please see problem based charting for further details.   Past Medical History:  Diagnosis Date  . Bell's palsy 03/2017  . Gallstones   . Gestational diabetes mellitus (GDM) 2005; 2006  . Headache    "monthly" (03/12/2017)  . History of chest pain   . History of prolonged Q-T interval on ECG    a. 09/2012 adm: QTC 506.  Marland Kitchen Hypertension   . PAF (paroxysmal atrial fibrillation) ([facility]) 2012   a. 07/2011 ED visit with AF-> converted with flecainide, never followed-up;  b. 05/2012 Recurrent Afib -echo: EF 65-60%, mildly dil LA. Placed on Xarelto/daily Flecainide. c. 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  . Small bowel obstruction ([facility]) 09/2010   resolved  . Type II diabetes mellitus ([facility])    a. 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 b. 05/2012 A1c 12.6.   Review of Systems:  As per HPI  Physical Exam:  Vitals:   12/07/19 0926  BP: (!) 183/82  Pulse: 73  Temp: 98.6 F (37 C)  TempSrc: Oral  SpO2: 99%  Weight: 181 lb 3.2 oz (82.2 kg)  Height: 5\' 5"  (1.651 m)   Physical Exam Constitutional:      Appearance: Normal appearance.  HENT:     Head: Normocephalic and atraumatic.  Cardiovascular:     Rate and Rhythm: Normal rate.     Heart sounds: Murmur (2+ systolic murmur appreciated at the second and fourth left intercostal space.) heard.   Pulmonary:     Effort: Pulmonary effort is normal. No respiratory distress.     Breath sounds: Normal breath sounds.  Musculoskeletal:        General: No swelling. Normal range of  motion.  Neurological:     Mental Status: She is alert.     Assessment & Plan:   See Encounters Tab for problem based charting.  Patient discussed with Dr. 

## 2019-12-07 NOTE — Assessment & Plan Note (Signed)
Hypertension: Uncontrolled.  Her initial blood pressure in the hospital was 215/96.  This gradually improved during her hospitalization.  During that time, she was found to be noncompliant with her medications.  Today, her BP is elevated however she states that she had not eaten this morning and thus did not take her medications.  She does not monitor her blood pressures at home.  BP Readings from Last 3 Encounters:  12/07/19 (!) 183/82  12/02/19 (!) 148/82  06/06/19 (!) 140/92   Plan: -Patient to purchase a blood pressure device today -She will check her blood pressure twice a day for 1 week -Return to clinic in a week -Continue losartan 25 mg daily, metoprolol XL 50 mg daily, verapamil 180 mg daily

## 2019-12-07 NOTE — Assessment & Plan Note (Signed)
>>  ASSESSMENT AND PLAN FOR ESSENTIAL HYPERTENSION WRITTEN ON 12/07/2019  9:53 AM BY AGYEI, OBED K, MD  Hypertension: Uncontrolled.  Her initial blood pressure in the hospital was 215/96.  This gradually improved during her hospitalization.  During that time, she was found to be noncompliant with her medications.  Today, her BP is elevated however she states that she had not eaten this morning and thus did not take her medications.  She does not monitor her blood pressures at home.  BP Readings from Last 3 Encounters:  12/07/19 (!) 183/82  12/02/19 (!) 148/82  06/06/19 (!) 140/92   Plan: -Patient to purchase a blood pressure device today -She will check her blood pressure twice a day for 1 week -Return to clinic in a week -Continue losartan  25 mg daily, metoprolol  XL 50 mg daily, verapamil  180 mg daily

## 2019-12-07 NOTE — Progress Notes (Signed)
Internal Medicine Clinic Attending  Case discussed with Dr. Agyei at the time of the visit.  We reviewed the resident's history and exam and pertinent patient test results.  I agree with the assessment, diagnosis, and plan of care documented in the resident's note.  Margret Moat, M.D., Ph.D.  

## 2019-12-07 NOTE — Patient Instructions (Signed)
[name],  Thanks for seeing me today.  As we discussed, I want you to purchase a blood pressure monitoring kit at a pharmacy.  I will also give you a paper to record your blood pressure twice a day for 1 week and come back to the clinic in a week with the blood pressure kit and the recording.  Take care! Dr. Agyei  Please call the internal medicine center clinic if you have any questions or concerns, we may be able to help and keep you from a long and expensive emergency room wait. Our clinic and after hours phone number is [phone], the best time to call is Monday through Friday 9 am to 4 pm but there is always someone available 24/7 if you have an emergency. If you need medication refills please notify your pharmacy one week in advance and they will send us a request.   If you have not gotten the COVID vaccine, I recommend doing so:  You may get it at your local [facility] or [facility] OR To schedule an appointment for a COVID vaccine or be added to the vaccine wait list: Go to [email or web address]   OR Go to [email or web address]                  OR Call [phone]                                     OR Call [phone] and select Option 2 

## 2019-12-08 LAB — BMP8+ANION GAP
Anion Gap: 19 mmol/L — ABNORMAL HIGH (ref 10.0–18.0)
BUN/Creatinine Ratio: 13 (ref 9–23)
BUN: 11 mg/dL (ref 6–24)
CO2: 23 mmol/L (ref 20–29)
Calcium: 9.6 mg/dL (ref 8.7–10.2)
Chloride: 94 mmol/L — ABNORMAL LOW (ref 96–106)
Creatinine, Ser: 0.82 mg/dL (ref 0.57–1.00)
GFR calc Af Amer: 98 mL/min/{1.73_m2} (ref >59)
GFR calc non Af Amer: 85 mL/min/{1.73_m2} (ref >59)
Glucose: 243 mg/dL — ABNORMAL HIGH (ref 65–99)
Potassium: 4.3 mmol/L (ref 3.5–5.2)
Sodium: 136 mmol/L (ref 134–144)

## 2019-12-11 ENCOUNTER — Encounter: Payer: Self-pay | Admitting: Cardiovascular Disease

## 2019-12-11 ENCOUNTER — Other Ambulatory Visit: Payer: Self-pay

## 2019-12-11 ENCOUNTER — Ambulatory Visit: Payer: BC Managed Care – PPO | Admitting: Cardiovascular Disease

## 2019-12-11 VITALS — BP 154/86 | HR 78 | Ht 65.0 in | Wt 181.0 lb

## 2019-12-11 DIAGNOSIS — I421 Obstructive hypertrophic cardiomyopathy: Secondary | ICD-10-CM | POA: Diagnosis not present

## 2019-12-11 NOTE — Patient Instructions (Signed)

## 2019-12-14 ENCOUNTER — Ambulatory Visit (INDEPENDENT_AMBULATORY_CARE_PROVIDER_SITE_OTHER): Payer: BC Managed Care – PPO | Admitting: Internal Medicine

## 2019-12-14 ENCOUNTER — Other Ambulatory Visit: Payer: Self-pay

## 2019-12-14 ENCOUNTER — Encounter: Payer: Self-pay | Admitting: Internal Medicine

## 2019-12-14 VITALS — BP 173/86 | HR 70 | Temp 98.0°F | Ht 66.0 in | Wt 185.2 lb

## 2019-12-14 DIAGNOSIS — I10 Essential (primary) hypertension: Secondary | ICD-10-CM

## 2019-12-14 MED ORDER — LOSARTAN POTASSIUM 50 MG PO TABS: 75.0000 mg | ORAL_TABLET | Freq: Every day | ORAL | 0 refills | Status: DC

## 2019-12-14 MED FILL — LOSARTAN POTASSIUM 50 MG TA: 50 | 90 days supply | Qty: 135 | Fill #0

## 2019-12-14 NOTE — Assessment & Plan Note (Addendum)
Hypertension: Uncontrolled.  I saw her last week and advised her to purchase a blood pressure machine which she did.  She states that she does not have the recording of her BP log.  Blood pressures recorded here in the clinic were elevated on both occasions.  She does endorse having high salt intake in her diet.  BP Readings from Last 3 Encounters:  12/14/19 (!) 173/86  12/11/19 (!) 154/86  12/07/19 (!) 183/82   Plan: -Advised to cut back on salt -Increase losartan from 25 mg daily to 75 mg daily -Continue metoprolol XL 50 mg daily -Continue verapamil 180 mg daily -Record BPs in the morning and evening.  Return to clinic in 1 week -BMP at next visit -If BP remains uncontrolled, options include hydralazine, doxazosin

## 2019-12-14 NOTE — Progress Notes (Signed)
   CC: Follow-up hypertension  HPI:  [name] is a 49 y.o. with medical history significant for uncontrolled hypertension presenting to follow-up on chronic medical problems  Please see problem based charting for further details.   Past Medical History:  Diagnosis Date  . Bell's palsy 03/2017  . Gallstones   . Gestational diabetes mellitus (GDM) 2005; 2006  . Headache    "monthly" (03/12/2017)  . History of chest pain   . History of prolonged Q-T interval on ECG    a. 09/2012 adm: QTC 506.  Marland Kitchen Hypertension   . PAF (paroxysmal atrial fibrillation) (HCC) 2012   a. 07/2011 ED visit with AF-> converted with flecainide, never followed-up;  b. 05/2012 Recurrent Afib -echo: EF 65-60%, mildly dil LA. Placed on Xarelto/daily Flecainide. c. 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  . Small bowel obstruction (HCC) 09/2010   resolved  . Type II diabetes mellitus (HCC)    a. 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 b. 05/2012 A1c 12.6.   Review of Systems:  As per HPI  Physical Exam:  Vitals:   12/14/19 1531 12/14/19 1533  BP: (!) 182/89 (!) 173/86  Pulse: 70   Temp: 98 F (36.7 C)   TempSrc: Oral   SpO2: 100%   Weight: 185 lb 3.2 oz (84 kg)   Height: 5\' 6"  (1.676 m)    Physical Exam Vitals reviewed.  Constitutional:      Appearance: Normal appearance.  Cardiovascular:     Heart sounds: Normal heart sounds.  Pulmonary:     Breath sounds: Normal breath sounds.  Neurological:     Mental Status: She is alert.  Psychiatric:        Mood and Affect: Mood normal.        Behavior: Behavior normal.     Assessment & Plan:   See Encounters Tab for problem based charting.  Patient discussed with Dr. 

## 2019-12-14 NOTE — Patient Instructions (Signed)
[name],  Here are my recommendations after our visit today.  1.  Please cut back on the salt 2.  Increase losartan from 25 mg daily to 75 mg daily (3 pills).  I have changed this in your chart and if you run out, please go pick up the new prescription in the pharmacy 3.  Continue the verapamil and metoprolol 4.  Continue checking your blood pressure twice a day in the morning and evening return to the clinic in 1 week.  Take Care! Dr. Dortha Schwalbe  Please call the internal medicine center clinic if you have any questions or concerns, we may be able to help and keep you from a long and expensive emergency room wait. Our clinic and after hours phone number is [phone], the best time to call is Monday through Friday 9 am to 4 pm but there is always someone available 24/7 if you have an emergency. If you need medication refills please notify your pharmacy one week in advance and they will send [place] a request.   If you have not gotten the COVID vaccine, I recommend doing so:  You may get it at your local [facility] or [facility] OR To schedule an appointment for a COVID vaccine or be added to the vaccine wait list: Go to [email or web address]   OR Go to [email or web address]                  OR Call [phone]                                     OR Call [phone] and select Option 2

## 2019-12-14 NOTE — Assessment & Plan Note (Signed)
>>  ASSESSMENT AND PLAN FOR ESSENTIAL HYPERTENSION WRITTEN ON 12/14/2019  4:13 PM BY AGYEI, OBED K, MD  Hypertension: Uncontrolled.  I saw her last week and advised her to purchase a blood pressure machine which she did.  She states that she does not have the recording of her BP log.  Blood pressures recorded here in the clinic were elevated on both occasions.  She does endorse having high salt intake in her diet.  BP Readings from Last 3 Encounters:  12/14/19 (!) 173/86  12/11/19 (!) 154/86  12/07/19 (!) 183/82   Plan: -Advised to cut back on salt -Increase losartan  from 25 mg daily to 75 mg daily -Continue metoprolol  XL 50 mg daily -Continue verapamil  180 mg daily -Record BPs in the morning and evening.  Return to clinic in 1 week -BMP at next visit -If BP remains uncontrolled, options include hydralazine , doxazosin

## 2019-12-15 NOTE — Progress Notes (Signed)
Internal Medicine Clinic Attending  Case discussed with Dr. Agyei  At the time of the visit.  We reviewed the resident's history and exam and pertinent patient test results.  I agree with the assessment, diagnosis, and plan of care documented in the resident's note.  

## 2019-12-21 ENCOUNTER — Other Ambulatory Visit: Payer: Self-pay | Admitting: Internal Medicine

## 2019-12-21 ENCOUNTER — Ambulatory Visit (INDEPENDENT_AMBULATORY_CARE_PROVIDER_SITE_OTHER): Payer: BC Managed Care – PPO | Admitting: Internal Medicine

## 2019-12-21 ENCOUNTER — Encounter: Payer: Self-pay | Admitting: Internal Medicine

## 2019-12-21 VITALS — BP 172/85 | HR 76 | Temp 98.7°F | Ht 66.0 in | Wt 184.6 lb

## 2019-12-21 DIAGNOSIS — I10 Essential (primary) hypertension: Secondary | ICD-10-CM

## 2019-12-21 MED ORDER — VERAPAMIL HCL ER 240 MG PO TBCR: 240.0000 mg | EXTENDED_RELEASE_TABLET | Freq: Every day | ORAL | 11 refills | Status: DC

## 2019-12-21 MED ORDER — LOSARTAN POTASSIUM 100 MG PO TABS: 100.0000 mg | ORAL_TABLET | Freq: Every day | ORAL | 3 refills | Status: DC

## 2019-12-21 MED FILL — LOSARTAN POTASSIUM 100 MG T: 100 | 30 days supply | Qty: 30 | Fill #0

## 2019-12-21 MED FILL — VERAPAMIL ER 240 MG TABLET: 240 | 30 days supply | Qty: 30 | Fill #0

## 2019-12-21 NOTE — Assessment & Plan Note (Signed)
>>  ASSESSMENT AND PLAN FOR ESSENTIAL HYPERTENSION WRITTEN ON 12/27/2019  1:30 PM BY AGYEI, OBED K, MD  Hypertension: Not at goal.  Initial blood pressure was 172/85, repeat blood pressure was 167/87.  She states that she has cut back on salt and has been watching her diet  BP Readings from Last 3 Encounters:  12/21/19 (!) 172/85  12/14/19 (!) 173/86  12/11/19 (!) 154/86       Plan: -Increase losartan  to 100 mg daily -Increase verapamil  to 240 mg daily -Continue metoprolol  50 mg daily -Follow-up aldosterone/renin and renal vascular ultrasound -I will follow up with his cardiologist for further recommendations  **Next steps: Add hydralazine  or refer to Dr. Skeeter hypertension clinic  ADDENDUM: She had a plasma aldosterone level of 11.4, renin level less than 0.167, aldosterone to rennin ratio greater than 68.3.  She had a CT renal stone in April 2021 which did not reveal any adrenal masses.  CT abdomen and pelvis performed on June 13, 2018 was negative for adrenal masses.  Presently, she does not have spontaneous hypokalemia.  It appeared that in May when she visited the emergency department, she was mildly hypokalemic at 3.4.  On review of her chart, there was a point in 2012 where she was severely hypokalemic with the lowest potassium of 2.9 however this was during an admission for uncontrolled diabetes/hyperkalemia and small bowel obstruction  Given that she had an undetectable renin and and normal aldosterone in the absence of hypokalemia, this is probably not hyper aldosterone state and will not pursue adrenal sampling or 24-hour urine collection of aldosterone.  We will continue optimizing her blood pressure medications.

## 2019-12-21 NOTE — Progress Notes (Signed)
° °  CC: Follow-up hypertension  HPI:  [name] is a 49 y.o. with medical history listed below presented to follow-up on hypertension.  Please see problem based charting for further details.   Past Medical History:  Diagnosis Date   Bell's palsy 03/2017   Gallstones    Gestational diabetes mellitus (GDM) 2005; 2006   Headache    "monthly" (03/12/2017)   History of chest pain    History of prolonged Q-T interval on ECG    a. 09/2012 adm: QTC 506.   Hypertension    PAF (paroxysmal atrial fibrillation) (HCC) 2012   a. 07/2011 ED visit with AF-> converted with flecainide, never followed-up;  b. 05/2012 Recurrent Afib -echo: EF 65-60%, mildly dil LA. Placed on Xarelto/daily Flecainide. c. 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   Small bowel obstruction (HCC) 09/2010   resolved   Type II diabetes mellitus (HCC)    a. 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 b. 05/2012 A1c 12.6.   Review of Systems:  As per HPI  Physical Exam:  Vitals:   12/21/19 1543  BP: (!) 172/85  Pulse: 76  Temp: 98.7 F (37.1 C)  TempSrc: Oral  SpO2: 98%  Weight: 184 lb 9.6 oz (83.7 kg)  Height: 5\' 6"  (1.676 m)   Physical Exam Vitals and nursing note reviewed.  Cardiovascular:     Rate and Rhythm: Normal rate.     Heart sounds: Normal heart sounds.  Pulmonary:     Breath sounds: Normal breath sounds.  Psychiatric:        Mood and Affect: Mood normal.        Behavior: Behavior normal.     Assessment & Plan:   See Encounters Tab for problem based charting.  Patient discussed with Dr. 

## 2019-12-21 NOTE — Assessment & Plan Note (Addendum)
Hypertension: Not at goal.  Initial blood pressure was 172/85, repeat blood pressure was 167/87.  She states that she has cut back on salt and has been watching her diet  BP Readings from Last 3 Encounters:  12/21/19 (!) 172/85  12/14/19 (!) 173/86  12/11/19 (!) 154/86       Plan: -Increase losartan to 100 mg daily -Increase verapamil to 240 mg daily -Continue metoprolol 50 mg daily -Follow-up aldosterone/renin and renal vascular ultrasound -I will follow up with his cardiologist for further recommendations  **Next steps: Add hydralazine or refer to Dr. Leonides Sake hypertension clinic  ADDENDUM: She had a plasma aldosterone level of 11.4, renin level less than 0.167, aldosterone to rennin ratio greater than 68.3.  She had a CT renal stone in April 2021 which did not reveal any adrenal masses.  CT abdomen and pelvis performed on June 13, 2018 was negative for adrenal masses.  Presently, she does not have spontaneous hypokalemia.  It appeared that in May when she visited the emergency department, she was mildly hypokalemic at 3.4.  On review of her chart, there was a point in 2012 where she was severely hypokalemic with the lowest potassium of 2.9 however this was during an admission for uncontrolled diabetes/hyperkalemia and small bowel obstruction  Given that she had an undetectable renin and and normal aldosterone in the absence of hypokalemia, this is probably not hyper aldosterone state and will not pursue adrenal sampling or 24-hour urine collection of aldosterone.  We will continue optimizing her blood pressure medications.

## 2019-12-21 NOTE — Patient Instructions (Addendum)
[name],   It was a pleasure taking care of you here in the clinic.  Here are my recommendations today.  1.  Increase losartan to 100 mg daily 2.  I have given you a new prescription for verapamil.  You will now take 240 mg daily 3.  I will reach out to your cardiologist and call you later.  Take care! Dr. Dortha Schwalbe  Please call the internal medicine center clinic if you have any questions or concerns, we may be able to help and keep you from a long and expensive emergency room wait. Our clinic and after hours phone number is [phone], the best time to call is Monday through Friday 9 am to 4 pm but there is always someone available 24/7 if you have an emergency. If you need medication refills please notify your pharmacy one week in advance and they will send [place] a request.   If you have not gotten the COVID vaccine, I recommend doing so:  You may get it at your local [facility] or [facility] OR To schedule an appointment for a COVID vaccine or be added to the vaccine wait list: Go to [email or web address]   OR Go to [email or web address]                  OR Call [phone]                                     OR Call [phone] and select Option 2

## 2019-12-21 NOTE — Addendum Note (Signed)
Addended byWaynard Edwards on: 12/21/2019 04:35 PM   Modules accepted: Orders

## 2019-12-22 NOTE — Progress Notes (Signed)
Internal Medicine Clinic Attending  Case discussed with Dr. Dortha Schwalbe  At the time of the visit.  We reviewed the resident's history and exam and pertinent patient test results.  I agree with the assessment, diagnosis, and plan of care documented in the resident's note.   To clarify, we will increase the losartan to 100mg  daily for this chronic asymptomatic hypertension.

## 2019-12-27 ENCOUNTER — Telehealth: Payer: Self-pay | Admitting: *Deleted

## 2019-12-27 LAB — ALDOSTERONE + RENIN ACTIVITY W/ RATIO
ALDOS/RENIN RATIO: >68.3 — ABNORMAL HIGH (ref 0.0–30.0)
ALDOSTERONE: 11.4 ng/dL (ref 0.0–30.0)
Renin: <0.167 ng/mL/hr — ABNORMAL LOW (ref 0.167–5.380)

## 2019-12-27 NOTE — Telephone Encounter (Signed)
-----   Message from Chilton Si, MD sent at 12/22/2019  6:05 PM EST ----- We will be happy to see her in [facility] HTN clinic.  Juliette Alcide, can you please help get her in?  Thanks, Tiffany ----- Message ----- From: Wendall Stade, MD Sent: 12/21/2019   9:54 PM EST To: Chilton Si, MD, Yvette Rack, MD  Hydralazine next and can also refer to Dr Thomasene Lot HTN clinic at our [facility] office ----- Message ----- From: Yvette Rack, MD Sent: 12/21/2019   4:26 PM EST To: Wendall Stade, MD  Hi Dr. Eden Emms,  I am Ms. Heuring's PCP and have been having trouble managing her hypertension despite her being consistent with dietary restrictions.  I have increased maximize her losartan and verapamil.  She is also on metoprolol as well.  We will see her back in the clinic in about a month.  In the event that her BP is still elevated, do have any recommendations?  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  Thanks.

## 2019-12-27 NOTE — Telephone Encounter (Signed)
Left message to call back and ask for [name] or [name]  Patient need AD[facility] HTN CLINIC visit

## 2020-01-03 ENCOUNTER — Other Ambulatory Visit: Payer: Self-pay

## 2020-01-03 ENCOUNTER — Emergency Department (HOSPITAL_COMMUNITY)
Admission: EM | Admit: 2020-01-03 | Discharge: 2020-01-04 | Disposition: A | Discharge status: Home / Self Care | Payer: BC Managed Care – PPO | Attending: Emergency Medicine | Admitting: Emergency Medicine

## 2020-01-03 ENCOUNTER — Encounter (HOSPITAL_COMMUNITY): Payer: Self-pay | Admitting: Emergency Medicine

## 2020-01-03 DIAGNOSIS — H9312 Tinnitus, left ear: Secondary | ICD-10-CM | POA: Insufficient documentation

## 2020-01-03 DIAGNOSIS — R1110 Vomiting, unspecified: Secondary | ICD-10-CM | POA: Insufficient documentation

## 2020-01-03 DIAGNOSIS — Z5321 Procedure and treatment not carried out due to patient leaving prior to being seen by health care provider: Secondary | ICD-10-CM | POA: Diagnosis not present

## 2020-01-03 NOTE — ED Triage Notes (Signed)
Patient reports worsening left ear tinnitus with emesis this evening.

## 2020-01-04 MED FILL — VERAPAMIL ER 240 MG TABLET: 240 | 30 days supply | Qty: 30 | Fill #0

## 2020-01-04 MED FILL — LOSARTAN POTASSIUM 100 MG T: 100 | 30 days supply | Qty: 30 | Fill #0

## 2020-01-04 NOTE — ED Notes (Signed)
Pt advised lobby staff she was leaving.

## 2020-01-11 ENCOUNTER — Ambulatory Visit (HOSPITAL_COMMUNITY)
Admission: RE | Admit: 2020-01-11 | Discharge: 2020-01-11 | Disposition: A | Discharge status: Home / Self Care | Payer: BC Managed Care – PPO | Source: Ambulatory Visit | Attending: Student in an Organized Health Care Education/Training Program | Admitting: Student in an Organized Health Care Education/Training Program

## 2020-01-11 ENCOUNTER — Other Ambulatory Visit: Payer: Self-pay

## 2020-01-11 DIAGNOSIS — I10 Essential (primary) hypertension: Secondary | ICD-10-CM | POA: Diagnosis present

## 2020-01-11 NOTE — Progress Notes (Signed)
Renal artery duplex completed. Refer to "CV Proc" under chart review to view preliminary results.  01/11/2020 9:50 AM Eula Fried., MHA, RVT, RDCS, RDMS

## 2020-01-17 ENCOUNTER — Encounter: Payer: BC Managed Care – PPO | Admitting: Student

## 2020-01-29 ENCOUNTER — Encounter: Payer: Self-pay | Admitting: Student

## 2020-02-01 ENCOUNTER — Encounter: Payer: BC Managed Care – PPO | Admitting: Student

## 2020-02-09 NOTE — Telephone Encounter (Signed)
Left message to call back  

## 2020-02-12 ENCOUNTER — Other Ambulatory Visit: Payer: Self-pay

## 2020-02-12 ENCOUNTER — Ambulatory Visit: Payer: BC Managed Care – PPO | Admitting: Internal Medicine

## 2020-02-12 ENCOUNTER — Encounter: Payer: Self-pay | Admitting: Internal Medicine

## 2020-02-12 ENCOUNTER — Other Ambulatory Visit: Payer: Self-pay | Admitting: Internal Medicine

## 2020-02-12 VITALS — BP 148/96 | HR 75 | Temp 98.2°F | Ht 66.0 in | Wt 182.3 lb

## 2020-02-12 DIAGNOSIS — E119 Type 2 diabetes mellitus without complications: Secondary | ICD-10-CM

## 2020-02-12 DIAGNOSIS — I10 Essential (primary) hypertension: Secondary | ICD-10-CM | POA: Diagnosis not present

## 2020-02-12 LAB — POCT GLYCOSYLATED HEMOGLOBIN (HGB A1C): Hemoglobin A1C: 12.4 % — AB (ref 4.0–5.6)

## 2020-02-12 LAB — GLUCOSE, CAPILLARY: Glucose-Capillary: 437 mg/dL — ABNORMAL HIGH (ref 70–99)

## 2020-02-12 MED ORDER — VERAPAMIL HCL ER 240 MG PO TBCR: 360.0000 mg | EXTENDED_RELEASE_TABLET | Freq: Every day | ORAL | 2 refills | Status: DC

## 2020-02-12 MED ORDER — SYNJARDY 5-1000 MG PO TABS: 1.0000 | ORAL_TABLET | Freq: Two times a day (BID) | ORAL | 2 refills | Status: DC

## 2020-02-12 MED ORDER — XULTOPHY 100-3.6 UNIT-MG/ML ~~LOC~~ SOPN: 16.0000 [IU] | PEN_INJECTOR | Freq: Every day | SUBCUTANEOUS | 1 refills | Status: DC

## 2020-02-12 MED FILL — SYNJARDY 5-1,000 MG TABLET: 5-1000 | 30 days supply | Qty: 60 | Fill #0

## 2020-02-12 MED FILL — VERAPAMIL ER 240 MG TABLET: 240 | 30 days supply | Qty: 45 | Fill #0

## 2020-02-12 MED FILL — XULTOPHY 100 UNIT-3.6MG/ML: 100-3.6 | 74 days supply | Qty: 12 | Fill #0

## 2020-02-12 NOTE — Patient Instructions (Addendum)
It was nice seeing you today! Thank you for choosing [facility] for your Primary Care.    Today we talked about:   1. Diabetes:  a. Today we made some adjustments to your medications: Stop Levemir and stop Metformin b. Start Synjardy 1 tablet twice a day. c. Start Xultophy.  This is an injectable medication that works similar to Copywriter, advertising.  It has 2 medications in it.  You will inject 16 units at bedtime.  It is very important you check your sugar in the morning when you wake up before eating anything.  2. High blood pressure:  a. I have increased the dose of your verapamil.  Please take 1 tablet and a half every day   Lets follow-up in 4 to 6 weeks to see how you are doing.  Please make sure to bring your glucometer in with you.

## 2020-02-12 NOTE — Progress Notes (Signed)
   CC: HTN, diabetes  HPI:  [name] is a 50 y.o. with a PMHx as listed below who presents to the clinic for HTN, diabetes.   Please see the Encounters tab for problem-based Assessment & Plan regarding status of patient's acute and chronic conditions.  Past Medical History:  Diagnosis Date  . Bell's palsy 03/2017  . Gallstones   . Gestational diabetes mellitus (GDM) 2005; 2006  . Headache    "monthly" (03/12/2017)  . History of chest pain   . History of prolonged Q-T interval on ECG    a. 09/2012 adm: QTC 506.  Marland Kitchen Hypertension   . PAF (paroxysmal atrial fibrillation) (HCC) 2012   a. 07/2011 ED visit with AF-> converted with flecainide, never followed-up;  b. 05/2012 Recurrent Afib -echo: EF 65-60%, mildly dil LA. Placed on Xarelto/daily Flecainide. c. 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  . Small bowel obstruction (HCC) 09/2010   resolved  . Type II diabetes mellitus (HCC)    a. 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 b. 05/2012 A1c 12.6.   Review of Systems: Review of Systems  Constitutional: Negative for chills and fever.  Respiratory: Negative for cough, shortness of breath and wheezing.   Cardiovascular: Negative for chest pain, palpitations, orthopnea and leg swelling.  Gastrointestinal: Negative for abdominal pain, diarrhea, nausea and vomiting.  Neurological: Negative for dizziness, weakness and headaches.   Physical Exam:  Vitals:   02/12/20 1335 02/12/20 1342  BP: (!) 181/86 (!) 148/96  Pulse: 78 75  Temp: 98.2 F (36.8 C)   TempSrc: Oral   SpO2: 98%   Weight: 182 lb 4.8 oz (82.7 kg)   Height: 5\' 6"  (1.676 m)    Physical Exam Vitals and nursing note reviewed.  Constitutional:      General: She is not in acute distress.    Appearance: She is normal weight.  HENT:     Head: Normocephalic and atraumatic.  Cardiovascular:     Rate and Rhythm: Normal rate and regular rhythm.     Heart sounds: Murmur (systolic murmur heard  best at RUSB) heard.    Pulmonary:     Effort: Pulmonary effort is normal. No respiratory distress.     Breath sounds: No wheezing or rales.  Abdominal:     General: Bowel sounds are normal.     Palpations: Abdomen is soft.  Musculoskeletal:     Right lower leg: No edema.     Left lower leg: No edema.  Skin:    General: Skin is warm and dry.  Neurological:     General: No focal deficit present.     Mental Status: She is alert and oriented to person, place, and time. Mental status is at baseline.  Psychiatric:        Mood and Affect: Mood normal.        Behavior: Behavior normal.    Assessment & Plan:   See Encounters Tab for problem based charting.  Patient discussed with Dr. 

## 2020-02-13 ENCOUNTER — Other Ambulatory Visit (HOSPITAL_COMMUNITY): Payer: Self-pay | Admitting: Internal Medicine

## 2020-02-13 MED ORDER — METOPROLOL SUCCINATE ER 50 MG PO TB24: ORAL_TABLET | ORAL | 2 refills | Status: DC

## 2020-02-13 MED ORDER — PEN NEEDLES 32G X 4 MM MISC: 1.0000 | Freq: Every day | 12 refills | Status: DC

## 2020-02-13 MED FILL — METOPROLOL SUCCINATE ER 50: 50 | 60 days supply | Qty: 60 | Fill #0

## 2020-02-13 MED FILL — BD PEN NDL NANO 32GX5/32: 32G X 4 MM | 90 days supply | Qty: 100 | Fill #0

## 2020-02-13 NOTE — Assessment & Plan Note (Signed)
[name] states that she has been doing well with her Levemir 13 units at bedtime and is not really having any difficulty with it. She continues to also take her Metformin 1 tablet twice daily.  She notes that she believes her A1c has not improved due to dietary indiscretions and she would like to work on removing rice from her diet. We discussed portion control as a goal.  She does not have her glucometer with her today. She notes that her sugars in the morning are between 100-150.  Assessment/plan: Lab Results  Component Value Date   HGBA1C 12.4 (A) 02/12/2020   A1c today obtained and does not show any improvement but no worsening either. Per chart review, patient was previously on Synjardy and I am unable to find why she was switched back to Metformin. Patient declines any adverse reactions to Donaldson and is unsure as well. Additionally I see that she was at some point on up to 55 units of Levemir daily, so I suspect the 13 units is not adequate despite her reporting her morning glucose is between 100-150. I discussed with [name] that at this point her uncontrolled diabetes is one of her biggest risk factors for a cardiovascular event and due to this we will make some medication adjustments today. We will go ahead and switch her Metformin back to Synjardy to add the benefit of a SGLT2. Additionally patient would be a good candidate for switching to Xultophy given the cardiovascular protection of GLP-1.   -4-week follow-up with glucometer -Discontinue Levemir -Start Xultophy 16 units daily at bedtime. Titration instructions provided and patient expressed understanding. -Discontinue metformin -Start Synjardy 05-998 mg twice daily

## 2020-02-13 NOTE — Assessment & Plan Note (Signed)
>>  ASSESSMENT AND PLAN FOR ESSENTIAL HYPERTENSION WRITTEN ON 02/13/2020  8:25 AM BY ARNETT SAUNDERS, MD  BP Readings from Last 3 Encounters:  02/12/20 (!) 148/96  01/04/20 (!) 150/90  12/21/19 (!) 172/85   Ms.Vinal states that she has been doing well on her medications and is not having any adverse reactions to the increased dosages. She denies any chest pain, dizziness, headache.  Assessment/plan: Blood pressure improving today but still not quite at goal. Her losartan  is at maximum dose and metoprolol  is unlikely to significantly by her blood pressure. Due to this, we will need to increase her verapamil .  -Increase verapamil  to 360 mg daily -Continue metoprolol  50 mg daily -Continue losartan  100 mg daily -4 to 6-week follow-up

## 2020-02-13 NOTE — Assessment & Plan Note (Signed)
BP Readings from Last 3 Encounters:  02/12/20 (!) 148/96  01/04/20 (!) 150/90  12/21/19 (!) 172/85   Ms.Greenbaum states that she has been doing well on her medications and is not having any adverse reactions to the increased dosages. She denies any chest pain, dizziness, headache.  Assessment/plan: Blood pressure improving today but still not quite at goal. Her losartan is at maximum dose and metoprolol is unlikely to significantly by her blood pressure. Due to this, we will need to increase her verapamil.  -Increase verapamil to 360 mg daily -Continue metoprolol 50 mg daily -Continue losartan 100 mg daily -4 to 6-week follow-up

## 2020-02-14 NOTE — Progress Notes (Signed)
Internal Medicine Clinic Attending  Case discussed with Dr. Basaraba  At the time of the visit.  We reviewed the resident's history and exam and pertinent patient test results.  I agree with the assessment, diagnosis, and plan of care documented in the resident's note.  

## 2020-02-16 NOTE — Telephone Encounter (Signed)
Spoke with patient and scheduled [facility] HTN CLINIC appointment

## 2020-02-28 ENCOUNTER — Other Ambulatory Visit: Payer: Self-pay | Admitting: Student

## 2020-03-28 ENCOUNTER — Other Ambulatory Visit: Payer: Self-pay

## 2020-03-28 ENCOUNTER — Other Ambulatory Visit: Payer: Self-pay | Admitting: Student

## 2020-03-28 ENCOUNTER — Encounter: Payer: Self-pay | Admitting: Student

## 2020-03-28 ENCOUNTER — Ambulatory Visit (INDEPENDENT_AMBULATORY_CARE_PROVIDER_SITE_OTHER): Payer: BC Managed Care – PPO | Admitting: Student

## 2020-03-28 VITALS — BP 177/87 | HR 68 | Temp 98.7°F | Ht 67.0 in | Wt 177.8 lb

## 2020-03-28 DIAGNOSIS — I10 Essential (primary) hypertension: Secondary | ICD-10-CM

## 2020-03-28 DIAGNOSIS — E119 Type 2 diabetes mellitus without complications: Secondary | ICD-10-CM | POA: Diagnosis not present

## 2020-03-28 MED ORDER — HYDROCHLOROTHIAZIDE 12.5 MG PO CAPS: 12.5000 mg | ORAL_CAPSULE | Freq: Every day | ORAL | 2 refills | Status: DC

## 2020-03-28 MED FILL — HYDROCHLOROTHIAZIDE 12.5 MG: 12.5 | 30 days supply | Qty: 30 | Fill #0

## 2020-03-28 NOTE — Assessment & Plan Note (Signed)
>>  ASSESSMENT AND PLAN FOR ESSENTIAL HYPERTENSION WRITTEN ON 03/28/2020  4:54 PM BY NGUYEN, QUAN, DO  BP checked in clinic 172/87 190/90 177/87  Patient states that her systolic blood pressure at home is in the 150s.  She reports compliant with her medications even though she does not remember the name of those medications.  Patient states that she is mainly eatws home-cooked food.  States that she does not use a lot of salt.  Assessment and plan Her blood pressure still not controlled.  There may be a component of whitecoat syndrome.  Aldosterone/renal ratio checked in 2021 was elevated.  However it is unlikely to be primary hyperaldosteronism without evidence of profound hypokalemia.  Her TSH was also normal in the past.  She denies shakiness, sweating, headache, palpitation, which makes pheochromocytoma unlikely.  CT abdomen pelvis in the past did not show any adrenal mass.  No clear secondary cause of her hypertension at this time.  Will add a low-dose of HCTZ 12.5 mg daily.  We normally avoid diuretics given her history of hypertrophic obstructive cardiomyopathy.  However patient is on Xarelto  for A. Fib, which is relatively contraindicated to use when SBP > 180 due to risk of cranial hemorrhage.  Therefore, we will try to keep a good balance between blood pressure control and avoid dehydration.  -Add HCTZ 12.5 mg daily.  Advised patient to avoid being dehydrated -Continue losartan  100 mg daily -Continue metoprolol  50 mg daily -Continue verapamil  360 mg daily -Work on a low-salt diet -BMP when she comes back in 2 weeks

## 2020-03-28 NOTE — Assessment & Plan Note (Addendum)
Hemoglobin A1c 12.4 in February.  At last visit, her regimen was changed to Xultophy 16 units at bedtime and Synjardy 05-998 mg twice daily.   Patient states that she has no issue with her new medication; however she could not remember exactly what medication she was taking.  States that she checked her sugar at 6 PM, CBG range from 120-160, lowest CBG of 99.  Patient denies any hypoglycemic events with no tremors or dizziness.  Patient states that she she is eating rice every day.   Assessment and plan It is difficult to gauge and to adjust the medications without knowing the exact value of her CBG.  I advised patient to check her blood sugar at least once a day first thing in the morning and keep a log.  Also advised patient to bring all her medications to her next visit to assure that she is taking her medications correctly.  -Continue to Xultophy 16 units at bedtime -Continue Synjardy 05-998 mg twice daily -Advised patient to bring her blood sugar log and all medications with her to next visit -Advised patient to cut back on carbohydrates such as rice and cake -Follow-up in 2 weeks -We will check urine microalbumin next visit

## 2020-03-28 NOTE — Assessment & Plan Note (Addendum)
BP checked in clinic 172/87 190/90 177/87  Patient states that her systolic blood pressure at home is in the 150s.  She reports compliant with her medications even though she does not remember the name of those medications.  Patient states that she is mainly eatws home-cooked food.  States that she does not use a lot of salt.  Assessment and plan Her blood pressure still not controlled.  There may be a component of whitecoat syndrome.  Aldosterone/renal ratio checked in 2021 was elevated.  However it is unlikely to be primary hyperaldosteronism without evidence of profound hypokalemia.  Her TSH was also normal in the past.  She denies shakiness, sweating, headache, palpitation, which makes pheochromocytoma unlikely.  CT abdomen pelvis in the past did not show any adrenal mass.  No clear secondary cause of her hypertension at this time.  Will add a low-dose of HCTZ 12.5 mg daily.  We normally avoid diuretics given her history of hypertrophic obstructive cardiomyopathy.  However patient is on Xarelto for A. Fib, which is relatively contraindicated to use when SBP > 180 due to risk of cranial hemorrhage.  Therefore, we will try to keep a good balance between blood pressure control and avoid dehydration.  -Add HCTZ 12.5 mg daily.  Advised patient to avoid being dehydrated -Continue losartan 100 mg daily -Continue metoprolol 50 mg daily -Continue verapamil 360 mg daily -Work on a low-salt diet -BMP when she comes back in 2 weeks

## 2020-03-28 NOTE — Progress Notes (Signed)
   CC: Follow-up on hypertension and diabetes  HPI:  [name] is a 50 y.o. with past medical history of hypertension, diabetes, A. fib, who presented to the clinic for 4 weeks follow-up for blood pressure and diabetes.   Please see problem based charting for further detail  Past Medical History:  Diagnosis Date  . Bell's palsy 03/2017  . Gallstones   . Gestational diabetes mellitus (GDM) 2005; 2006  . Headache    "monthly" (03/12/2017)  . History of chest pain   . History of prolonged Q-T interval on ECG    a. 09/2012 adm: QTC 506.  Marland Kitchen Hypertension   . PAF (paroxysmal atrial fibrillation) (HCC) 2012   a. 07/2011 ED visit with AF-> converted with flecainide, never followed-up;  b. 05/2012 Recurrent Afib -echo: EF 65-60%, mildly dil LA. Placed on Xarelto/daily Flecainide. c. 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  . Small bowel obstruction (HCC) 09/2010   resolved  . Type II diabetes mellitus (HCC)    a. 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 b. 05/2012 A1c 12.6.   Review of Systems:   Review of Systems  Respiratory: Negative for shortness of breath.   Cardiovascular: Negative for chest pain.  Neurological: Negative for dizziness, tremors, weakness and headaches.     Physical Exam:  Vitals:   03/28/20 1534 03/28/20 1603 03/28/20 1604  BP: (!) 172/87 (!) 190/90 (!) 177/87  Pulse: 68    Temp: 98.7 F (37.1 C)    TempSrc: Oral    SpO2: 98%    Weight: 177 lb 12.8 oz (80.6 kg)    Height: 5\' 7"  (1.702 m)     Physical Exam Constitutional:      General: She is not in acute distress. HENT:     Head: Normocephalic.  Eyes:     General:        Right eye: No discharge.        Left eye: No discharge.  Cardiovascular:     Rate and Rhythm: Normal rate and regular rhythm.     Heart sounds: Murmur (3/6 systolic murmur at upper sternal border) heard.    Pulmonary:     Effort: Pulmonary effort is normal. No respiratory distress.     Breath  sounds: No wheezing.  Musculoskeletal:     Cervical back: Normal range of motion.  Skin:    General: Skin is warm.  Neurological:     Mental Status: She is alert.     Assessment & Plan:   See Encounters Tab for problem based charting.  Patient discussed with Dr. 

## 2020-03-28 NOTE — Patient Instructions (Addendum)
Ms. Milham,  It is a pleasure seeing you in the clinic today.  Here is a summary of what we talked about  1.  Diabetes: Please check your finger stick blood sugar first thing in the morning before breakfast.  Please keep a log of your blood sugar numbers and bring it with you to the next visit.  Please bring all your medications with you to the next visit.  2.  High blood pressure: Your blood pressure is high today.  I will add a low-dose of HCTZ 12.5 mg, take 1 tablet a day.  I will see you back in 2 weeks.   Take care  Dr. Cyndie Chime

## 2020-03-29 NOTE — Progress Notes (Signed)
Internal Medicine Clinic Attending  Case discussed with Dr. Nguyen  At the time of the visit.  We reviewed the resident's history and exam and pertinent patient test results.  I agree with the assessment, diagnosis, and plan of care documented in the resident's note. 

## 2020-04-01 ENCOUNTER — Ambulatory Visit: Payer: BC Managed Care – PPO | Admitting: Cardiovascular Disease

## 2020-04-06 ENCOUNTER — Other Ambulatory Visit (HOSPITAL_COMMUNITY): Payer: Self-pay

## 2020-04-17 ENCOUNTER — Encounter: Payer: BC Managed Care – PPO | Admitting: Student

## 2020-05-01 ENCOUNTER — Other Ambulatory Visit (HOSPITAL_COMMUNITY): Payer: Self-pay

## 2020-05-01 ENCOUNTER — Encounter: Payer: Self-pay | Admitting: Student

## 2020-05-01 ENCOUNTER — Ambulatory Visit (INDEPENDENT_AMBULATORY_CARE_PROVIDER_SITE_OTHER): Payer: BC Managed Care – PPO | Admitting: Student

## 2020-05-01 VITALS — BP 181/89 | HR 68 | Temp 98.3°F | Ht 67.0 in | Wt 181.5 lb

## 2020-05-01 DIAGNOSIS — E1169 Type 2 diabetes mellitus with other specified complication: Secondary | ICD-10-CM

## 2020-05-01 DIAGNOSIS — T50905A Adverse effect of unspecified drugs, medicaments and biological substances, initial encounter: Secondary | ICD-10-CM | POA: Diagnosis not present

## 2020-05-01 DIAGNOSIS — Z1211 Encounter for screening for malignant neoplasm of colon: Secondary | ICD-10-CM

## 2020-05-01 DIAGNOSIS — R58 Hemorrhage, not elsewhere classified: Secondary | ICD-10-CM | POA: Diagnosis not present

## 2020-05-01 DIAGNOSIS — Z1159 Encounter for screening for other viral diseases: Secondary | ICD-10-CM | POA: Diagnosis not present

## 2020-05-01 DIAGNOSIS — I10 Essential (primary) hypertension: Secondary | ICD-10-CM

## 2020-05-01 DIAGNOSIS — Z794 Long term (current) use of insulin: Secondary | ICD-10-CM

## 2020-05-01 DIAGNOSIS — I480 Paroxysmal atrial fibrillation: Secondary | ICD-10-CM

## 2020-05-01 DIAGNOSIS — Z0000 Encounter for general adult medical examination without abnormal findings: Secondary | ICD-10-CM

## 2020-05-01 DIAGNOSIS — I421 Obstructive hypertrophic cardiomyopathy: Secondary | ICD-10-CM

## 2020-05-01 MED ORDER — LOSARTAN POTASSIUM-HCTZ 100-25 MG PO TABS
1.0000 | ORAL_TABLET | Freq: Every day | ORAL | 3 refills | Status: DC
  Filled 2020-05-01: qty 30, 30d supply, fill #0

## 2020-05-01 NOTE — Progress Notes (Signed)
   CC: Hypertension   HPI:  [name] is a 50 y.o. lady w/ PMHx HTN, DM, paroxysmal A-Fib and prolonged QTc, presenting for follow up of HTN. Please see problem-based assessment and plan for full details.   Past Medical History:  Diagnosis Date  . Bell's palsy 03/2017  . Gallstones   . Gestational diabetes mellitus (GDM) 2005; 2006  . Headache    "monthly" (03/12/2017)  . History of chest pain   . History of prolonged Q-T interval on ECG    a. 09/2012 adm: QTC 506.  Marland Kitchen Hypertension   . PAF (paroxysmal atrial fibrillation) (HCC) 2012   a. 07/2011 ED visit with AF-> converted with flecainide, never followed-up;  b. 05/2012 Recurrent Afib -echo: EF 65-60%, mildly dil LA. Placed on Xarelto/daily Flecainide. c. 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  . Small bowel obstruction (HCC) 09/2010   resolved  . Type II diabetes mellitus (HCC)    a. 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 b. 05/2012 A1c 12.6.   Review of Systems:  All others negative except as noted in assessment and plan.   Physical Exam:  Vitals:   05/01/20 1513  BP: (!) 179/90  Pulse: 73  Temp: 98.3 F (36.8 C)  TempSrc: Oral  SpO2: 100%  Weight: 181 lb 8 oz (82.3 kg)  Height: 5\' 7"  (1.702 m)   General: Patient appears tired but well. No acute distress. Eyes: Sclera non-icteric. No conjunctival injection.  HENT: MMM. No nasal discharge. Respiratory: Lungs are CTA, bilaterally. No wheezes, rales, or rhonchi.  Cardiovascular: Regular rate and rhythm. There is a 3/6 SEM heard over the RUSB radiating to the right upper chest. There is a 3/6 early systolic murmur heard best over the LLSB. No other appreciable murmurs. No lower extremity edema. Radial pulses are 2+ in bilateral upper extremities.  Abdominal: Soft and non-tender to palpation. Bowel sounds intact. No rebound or guarding. No palpable masses or hernias.  Neurological: Alert and oriented x 3. No facial asymmetry or sensory  abnormalities.  Musculoskeletal: Strength is 5/5 in all four extremities.  Skin: No lesions. No rashes.  Psych: Normal affect. Normal tone of voice.   Assessment & Plan:   See Encounters Tab for problem based charting.  Patient discussed with Dr. .  Oswaldo Done, MD 05/02/2020, 11:02 PM Pager: [phone]

## 2020-05-01 NOTE — Patient Instructions (Signed)
[name],   Your blood pressure continues to remain high despite addition of HCTZ to your medication regimen. I have made the following changes to help bring your blood pressure down:   - STOP taking HCTZ and Losartan  - START taking Hyzaar 1 tablet every morning - Please be sure to drink plenty of water to avoid dehydration and call our clinic if you begin to feel chest pain, light-headedness, or feelings as though you may lose consciousness with this medication change.   Please call your cardiologist to schedule a follow up appointment as soon as you are able. We will be in touch with them to discuss your current medications and any further changes needed.  Please continue to monitor your blood pressure at rest, before having any caffeine intake and before exercising once daily and record these values on paper to bring to your next visit.   Please also continue to check your blood sugars once daily first thing in the morning every day. Please either document these sugars or bring your glucometer in to your follow up visit and we will check an A1c at that time.   I have sent in referrals to both the ophthalmologist (eye doctor) and the gastroenterologist (GI doctor) for a diabetic eye exam and colonoscopy, respectively. You should receive calls to schedule these in the next couple weeks.   I will call you with results from today when they are available. I have refilled your medications. Please schedule a follow up appointment within the next 2-3 weeks or as soon as you are able for follow up of your blood pressure and diabetes.  Keep up the great work with the dietary changes!   Take care,  Dr. Laddie Aquas    [email or web address]">  DASH Eating Plan DASH stands for Dietary Approaches to Stop Hypertension. The DASH eating plan is a healthy eating plan that has been shown to:  Reduce high blood pressure (hypertension).  Reduce your risk for type  2 diabetes, heart disease, and stroke.  Help with weight loss. What are tips for following this plan? Reading food labels  Check food labels for the amount of salt (sodium) per serving. Choose foods with less than 5 percent of the Daily Value of sodium. Generally, foods with less than 300 milligrams (mg) of sodium per serving fit into this eating plan.  To find whole grains, look for the word "whole" as the first word in the ingredient list. Shopping  Buy products labeled as "low-sodium" or "no salt added."  Buy fresh foods. Avoid canned foods and pre-made or frozen meals. Cooking  Avoid adding salt when cooking. Use salt-free seasonings or herbs instead of table salt or sea salt. Check with your health care provider or pharmacist before using salt substitutes.  Do not fry foods. Cook foods using healthy methods such as baking, boiling, grilling, roasting, and broiling instead.  Cook with heart-healthy oils, such as olive, canola, avocado, soybean, or sunflower oil. Meal planning  Eat a balanced diet that includes: ? 4 or more servings of fruits and 4 or more servings of vegetables each day. Try to fill one-half of your plate with fruits and vegetables. ? 6-8 servings of whole grains each day. ? Less than 6 oz (170 g) of lean meat, poultry, or fish each day. A 3-oz (85-g) serving of meat is about the same size as a deck of cards. One egg equals 1 oz (28 g). ? 2-3 servings of low-fat dairy each day. One serving is  1 cup (237 mL). ? 1 serving of nuts, seeds, or beans 5 times each week. ? 2-3 servings of heart-healthy fats. Healthy fats called omega-3 fatty acids are found in foods such as walnuts, flaxseeds, fortified milks, and eggs. These fats are also found in cold-water fish, such as sardines, salmon, and mackerel.  Limit how much you eat of: ? Canned or prepackaged foods. ? Food that is high in trans fat, such as some fried foods. ? Food that is high in saturated fat, such as  fatty meat. ? Desserts and other sweets, sugary drinks, and other foods with added sugar. ? Full-fat dairy products.  Do not salt foods before eating.  Do not eat more than 4 egg yolks a week.  Try to eat at least 2 vegetarian meals a week.  Eat more home-cooked food and less restaurant, buffet, and fast food.   Lifestyle  When eating at a restaurant, ask that your food be prepared with less salt or no salt, if possible.  If you drink alcohol: ? Limit how much you use to:  0-1 drink a day for women who are not pregnant.  0-2 drinks a day for men. ? Be aware of how much alcohol is in your drink. In the [place] one drink equals one 12 oz bottle of beer (355 mL), one 5 oz glass of wine (148 mL), or one 1 oz glass of hard liquor (44 mL). General information  Avoid eating more than 2,300 mg of salt a day. If you have hypertension, you may need to reduce your sodium intake to 1,500 mg a day.  Work with your health care provider to maintain a healthy body weight or to lose weight. Ask what an ideal weight is for you.  Get at least 30 minutes of exercise that causes your heart to beat faster (aerobic exercise) most days of the week. Activities may include walking, swimming, or biking.  Work with your health care provider or dietitian to adjust your eating plan to your individual calorie needs. What foods should I eat? Fruits All fresh, dried, or frozen fruit. Canned fruit in natural juice (without added sugar). Vegetables Fresh or frozen vegetables (raw, steamed, roasted, or grilled). Low-sodium or reduced-sodium tomato and vegetable juice. Low-sodium or reduced-sodium tomato sauce and tomato paste. Low-sodium or reduced-sodium canned vegetables. Grains Whole-grain or whole-wheat bread. Whole-grain or whole-wheat pasta. Brown rice. Orpah Cobb. Bulgur. Whole-grain and low-sodium cereals. Pita bread. Low-fat, low-sodium crackers. Whole-wheat flour tortillas. Meats and other  proteins Skinless chicken or [place]. Ground chicken or [place]. Pork with fat trimmed off. Fish and seafood. Egg whites. Dried beans, peas, or lentils. Unsalted nuts, nut butters, and seeds. Unsalted canned beans. Lean cuts of beef with fat trimmed off. Low-sodium, lean precooked or cured meat, such as sausages or meat loaves. Dairy Low-fat (1%) or fat-free (skim) milk. Reduced-fat, low-fat, or fat-free cheeses. Nonfat, low-sodium ricotta or cottage cheese. Low-fat or nonfat yogurt. Low-fat, low-sodium cheese. Fats and oils Soft margarine without trans fats. Vegetable oil. Reduced-fat, low-fat, or light mayonnaise and salad dressings (reduced-sodium). Canola, safflower, olive, avocado, soybean, and sunflower oils. Avocado. Seasonings and condiments Herbs. Spices. Seasoning mixes without salt. Other foods Unsalted popcorn and pretzels. Fat-free sweets. The items listed above may not be a complete list of foods and beverages you can eat. Contact a dietitian for more information. What foods should I avoid? Fruits Canned fruit in a light or heavy syrup. Fried fruit. Fruit in cream or butter sauce. Vegetables Creamed or  fried vegetables. Vegetables in a cheese sauce. Regular canned vegetables (not low-sodium or reduced-sodium). Regular canned tomato sauce and paste (not low-sodium or reduced-sodium). Regular tomato and vegetable juice (not low-sodium or reduced-sodium). Rosita Fire. Olives. Grains Baked goods made with fat, such as croissants, muffins, or some breads. Dry pasta or rice meal packs. Meats and other proteins Fatty cuts of meat. Ribs. Fried meat. Tomasa Blase. Bologna, salami, and other precooked or cured meats, such as sausages or meat loaves. Fat from the back of a pig (fatback). Bratwurst. Salted nuts and seeds. Canned beans with added salt. Canned or smoked fish. Whole eggs or egg yolks. Chicken or [place] with skin. Dairy Whole or 2% milk, cream, and half-and-half. Whole or full-fat cream  cheese. Whole-fat or sweetened yogurt. Full-fat cheese. Nondairy creamers. Whipped toppings. Processed cheese and cheese spreads. Fats and oils Butter. Stick margarine. Lard. Shortening. Ghee. Bacon fat. Tropical oils, such as coconut, palm kernel, or palm oil. Seasonings and condiments Onion salt, garlic salt, seasoned salt, table salt, and sea salt. Worcestershire sauce. Tartar sauce. Barbecue sauce. Teriyaki sauce. Soy sauce, including reduced-sodium. Steak sauce. Canned and packaged gravies. Fish sauce. Oyster sauce. Cocktail sauce. Store-bought horseradish. Ketchup. Mustard. Meat flavorings and tenderizers. Bouillon cubes. Hot sauces. Pre-made or packaged marinades. Pre-made or packaged taco seasonings. Relishes. Regular salad dressings. Other foods Salted popcorn and pretzels. The items listed above may not be a complete list of foods and beverages you should avoid. Contact a dietitian for more information. Where to find more information  National Heart, Lung, and Blood Institute: PopSteam.is  American Heart Association: www.heart.org  Academy of Nutrition and Dietetics: www.eatright.org  National Kidney Foundation: www.kidney.org Summary  The DASH eating plan is a healthy eating plan that has been shown to reduce high blood pressure (hypertension). It may also reduce your risk for type 2 diabetes, heart disease, and stroke.  When on the DASH eating plan, aim to eat more fresh fruits and vegetables, whole grains, lean proteins, low-fat dairy, and heart-healthy fats.  With the DASH eating plan, you should limit salt (sodium) intake to 2,300 mg a day. If you have hypertension, you may need to reduce your sodium intake to 1,500 mg a day.  Work with your health care provider or dietitian to adjust your eating plan to your individual calorie needs. This information is not intended to replace advice given to you by your health care provider. Make sure you discuss any questions you  have with your health care provider. Document Revised: 11/25/2018 Document Reviewed: 11/25/2018 Elsevier Patient Education  2021 [employer].

## 2020-05-02 DIAGNOSIS — Z1211 Encounter for screening for malignant neoplasm of colon: Secondary | ICD-10-CM | POA: Insufficient documentation

## 2020-05-02 LAB — HEPATITIS C ANTIBODY: Hep C Virus Ab: <0.1 s/co ratio (ref 0.0–0.9)

## 2020-05-02 LAB — BMP8+ANION GAP
Anion Gap: 16 mmol/L (ref 10.0–18.0)
BUN/Creatinine Ratio: 14 (ref 9–23)
BUN: 13 mg/dL (ref 6–24)
CO2: 26 mmol/L (ref 20–29)
Calcium: 9.8 mg/dL (ref 8.7–10.2)
Chloride: 98 mmol/L (ref 96–106)
Creatinine, Ser: 0.95 mg/dL (ref 0.57–1.00)
Glucose: 173 mg/dL — ABNORMAL HIGH (ref 65–99)
Potassium: 4.4 mmol/L (ref 3.5–5.2)
Sodium: 140 mmol/L (ref 134–144)
eGFR: 73 mL/min/{1.73_m2} (ref >59)

## 2020-05-02 LAB — MICROALBUMIN / CREATININE URINE RATIO
Creatinine, Urine: 64.8 mg/dL
Microalb/Creat Ratio: 23 mg/g creat (ref 0–29)
Microalbumin, Urine: 14.8 ug/mL

## 2020-05-02 LAB — CBC
Hematocrit: 40.8 % (ref 34.0–46.6)
Hemoglobin: 13 g/dL (ref 11.1–15.9)
MCH: 25.5 pg — ABNORMAL LOW (ref 26.6–33.0)
MCHC: 31.9 g/dL (ref 31.5–35.7)
MCV: 80 fL (ref 79–97)
Platelets: 273 10*3/uL (ref 150–450)
RBC: 5.1 x10E6/uL (ref 3.77–5.28)
RDW: 14 % (ref 11.7–15.4)
WBC: 8.8 10*3/uL (ref 3.4–10.8)

## 2020-05-02 NOTE — Assessment & Plan Note (Signed)
Patient is in need of 1-time adult HCV screening and is low risk.   - HCV antibody was within normal limits.

## 2020-05-02 NOTE — Assessment & Plan Note (Signed)
Patient has a cardiologist although notes non-compliance with Xarelto, taking it only ~ every other day as she experiences blood in her mouth in the mornings. She denies any gingival bleeding with brushing her teeth or otherwise, hemoptysis, or hematemesis and says the bleeding is extremely minimal (streaks in sputum). Denies cough, fevers, or associated symptoms.   - Encouraged her to take Xarelto 20mg  daily and follow up with cardiologist  - Patient will require better control of blood pressure; continue close monitoring  - Educated her to discontinue Xarelto and call [facility] / cardiology if she develops more substantial bleeding

## 2020-05-02 NOTE — Assessment & Plan Note (Signed)
Patient is due for colon cancer screening. Denies symptoms or family history.   - Referral placed to [facility] for screening colonoscopy

## 2020-05-02 NOTE — Assessment & Plan Note (Signed)
[name] does continue to have a 3/6 RUSB as well as a 3/6 LLSB on today's exam. Her last ECHO was 11/2019 with hyperdynamic function and grade I diastolic dysfunction.   - Avoid dehydration  - Continue cardiology follow up

## 2020-05-02 NOTE — Assessment & Plan Note (Addendum)
Blood pressure remains persistently elevated on exam today despite addition of HCTZ 12.5mg  last visit. Patient had been compliant with daily antihypertensives except had not taken this mornings' doses. BMP is without renal or electrolyte disturbance.   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

## 2020-05-02 NOTE — Assessment & Plan Note (Signed)
Patient will be due for pap smear, TDap and will require COVID-19 vaccine discussion next visit.

## 2020-05-02 NOTE — Assessment & Plan Note (Signed)
>>  ASSESSMENT AND PLAN FOR ESSENTIAL HYPERTENSION WRITTEN ON 05/02/2020 11:10 PM BY SPEAKMAN, RACHEL, MD  Blood pressure remains persistently elevated on exam today despite addition of HCTZ 12.5mg  last visit. Patient had been compliant with daily antihypertensives except had not taken this mornings' doses. BMP is without renal or electrolyte disturbance.   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

## 2020-05-02 NOTE — Assessment & Plan Note (Addendum)
Patient is not due for Hgb A1c until next month, although previous A1c 02/2020 was uncontrolled at 12.4. Patient says she has mostly cut out rice from her diet and has worked on decreasing portions and other carbohydrates. She checks her sugar fasting every morning which she says has been ~160. Denies missing doses of her medications although she did forget to bring her meter to today's appointment. Urine microalbumin continues to improve on appropriate therapy and BMP is unremarkable aside from hyperglycemia. Endorses recent blurry vision.  - Hgb A1c due 05/2020 - Continue Xultophy 16 units nightly  - Continue Synjardy 5-1000mg  BID  - Continue daily monitoring and bring glucometer to next appointment  - Referral placed for ophthalmology; has never been

## 2020-05-03 ENCOUNTER — Telehealth: Payer: Self-pay | Admitting: Student

## 2020-05-03 ENCOUNTER — Other Ambulatory Visit (HOSPITAL_COMMUNITY): Payer: Self-pay

## 2020-05-03 MED FILL — Insulin Degludec-Liraglutide Sol Pen-Inj 100-3.6 Unit-MG/ML: SUBCUTANEOUS | 75 days supply | Qty: 12 | Fill #0 | Status: CN

## 2020-05-03 MED FILL — Verapamil HCl Tab ER 240 MG: ORAL | 30 days supply | Qty: 45 | Fill #0 | Status: CN

## 2020-05-03 MED FILL — Empagliflozin-Metformin HCl Tab 5-1000 MG: ORAL | 30 days supply | Qty: 60 | Fill #0 | Status: AC

## 2020-05-03 NOTE — Progress Notes (Signed)
Internal Medicine Clinic Attending  Case discussed with Dr. Speakman  At the time of the visit.  We reviewed the resident's history and exam and pertinent patient test results.  I agree with the assessment, diagnosis, and plan of care documented in the resident's note.  

## 2020-05-03 NOTE — Telephone Encounter (Signed)
Called to inform patient that her lab results were unremarkable. Her proteinuria has improved on current medications. Informed her she will be due for PAP smear next visit and she is in agreement.   - Will need cardiology follow up and maintained hydration  - Hgb A1c next visit  - PAP smear and TDap/vaccination discussion next visit   Glenford Bayley, MD 05/03/2020, 2:16 PM Pager: [phone]

## 2020-05-22 ENCOUNTER — Encounter: Payer: BC Managed Care – PPO | Admitting: Internal Medicine

## 2020-05-30 ENCOUNTER — Encounter: Payer: Self-pay | Admitting: *Deleted

## 2020-05-31 ENCOUNTER — Encounter: Payer: Self-pay | Admitting: Internal Medicine

## 2020-05-31 ENCOUNTER — Ambulatory Visit: Payer: BC Managed Care – PPO | Admitting: Student

## 2020-05-31 ENCOUNTER — Other Ambulatory Visit (HOSPITAL_COMMUNITY): Payer: Self-pay

## 2020-05-31 VITALS — BP 177/94 | HR 81 | Temp 98.2°F | Ht 67.0 in | Wt 178.0 lb

## 2020-05-31 DIAGNOSIS — E1169 Type 2 diabetes mellitus with other specified complication: Secondary | ICD-10-CM | POA: Diagnosis not present

## 2020-05-31 DIAGNOSIS — M25511 Pain in right shoulder: Secondary | ICD-10-CM

## 2020-05-31 DIAGNOSIS — Z0000 Encounter for general adult medical examination without abnormal findings: Secondary | ICD-10-CM

## 2020-05-31 DIAGNOSIS — I10 Essential (primary) hypertension: Secondary | ICD-10-CM

## 2020-05-31 DIAGNOSIS — E119 Type 2 diabetes mellitus without complications: Secondary | ICD-10-CM

## 2020-05-31 DIAGNOSIS — M25519 Pain in unspecified shoulder: Secondary | ICD-10-CM | POA: Insufficient documentation

## 2020-05-31 DIAGNOSIS — Z23 Encounter for immunization: Secondary | ICD-10-CM | POA: Diagnosis not present

## 2020-05-31 DIAGNOSIS — Z794 Long term (current) use of insulin: Secondary | ICD-10-CM | POA: Diagnosis not present

## 2020-05-31 LAB — POCT GLYCOSYLATED HEMOGLOBIN (HGB A1C): Hemoglobin A1C: 11.1 % — AB (ref 4.0–5.6)

## 2020-05-31 LAB — GLUCOSE, CAPILLARY: Glucose-Capillary: 253 mg/dL — ABNORMAL HIGH (ref 70–99)

## 2020-05-31 MED ORDER — INSULIN DEGLUDEC-LIRAGLUTIDE 100-3.6 UNIT-MG/ML ~~LOC~~ SOPN
PEN_INJECTOR | SUBCUTANEOUS | 4 refills | Status: DC
  Filled 2020-05-31: qty 15, 62d supply, fill #0

## 2020-05-31 MED FILL — Insulin Pen Needle 32 G X 4 MM (1/6" or 5/32"): 90 days supply | Qty: 100 | Fill #0 | Status: AC

## 2020-05-31 NOTE — Assessment & Plan Note (Addendum)
Lab Results  Component Value Date   HGBA1C 11.1 (A) 05/31/2020   HGBA1C 12.4 (A) 02/12/2020   HGBA1C 12.4 (H) 12/02/2019   After some clarification, patient endorses non-adherence with her Xultophy because she often arrives home late from work and forgets to take it. She often wakes up later and remembers it, but does not take it as she was unsure if that would be safe. I reinforced medication adherence, and that she can take this within a few hours of her normal dosage time safely. Reports blood sugars of 150s-200s at home.  Of note, there is a small old appearing puncture wound on the bottom of her right foot.  No erythema or edema or purulence.  Likely from prior injury.  Patient advised to examine her feet periodically.  No other foot lesions.  - reinforced medication adherence - continue Synjardy 5-1000mg  BID - increase Xultophy to 20 units this week then 24 units next week - patient will check fasting blood sugars daily and call if hypoglycemic - follow up in 3 months for repeat A1c - referral for ophthalmology placed in April [phone]

## 2020-05-31 NOTE — Assessment & Plan Note (Signed)
-   ophto exam referral in place, patient given number to call back and schedule - TDAP administered - COVID vaccines documented, counseled on getting booster - coonoscopy referral in place - will need pap smear at next visit

## 2020-05-31 NOTE — Assessment & Plan Note (Signed)
Patient complains of mild pain to her R shoulder at the lower border of the scapula. On exam there is no significant tenderness or mass to palpation. Pain is worse with external rotation. Other shoulder exam including empty can test is negative. 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 Denies any trauma.  - advised to rest arm, apply warm pack - tylenol and ibuprofen prn - given instructions to call back if not improving

## 2020-05-31 NOTE — Progress Notes (Signed)
   CC: shoulder pain, diabetes, hypertension  HPI:  [name] is a 50 y.o. female with history as below presenting for the above. Please refer to problem based charting for further details of assessment and plan of current problem and chronic medical conditions.   Past Medical History:  Diagnosis Date  . Bell's palsy 03/2017  . Gallstones   . Gestational diabetes mellitus (GDM) 2005; 2006  . Headache    "monthly" (03/12/2017)  . History of chest pain   . History of prolonged Q-T interval on ECG    a. 09/2012 adm: QTC 506.  [place] Hypertension   . PAF (paroxysmal atrial fibrillation) (HCC) 2012   a. 07/2011 ED visit with AF-> converted with flecainide, never followed-up;  b. 05/2012 Recurrent Afib -echo: EF 65-60%, mildly dil LA. Placed on Xarelto/daily Flecainide. c. 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  . Small bowel obstruction (HCC) 09/2010   resolved  . Type II diabetes mellitus (HCC)    a. 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 b. 05/2012 A1c 12.6.   Review of Systems:   Review of Systems  Constitutional: Negative for chills, fever, malaise/fatigue and weight loss.  Cardiovascular: Negative for chest pain and palpitations.  Musculoskeletal: Positive for myalgias.  Neurological: Positive for sensory change.  All other systems reviewed and are negative.    Physical Exam: Vitals:   05/31/20 0957  BP: (!) 177/94  Pulse: 81  Temp: 98.2 F (36.8 C)  TempSrc: Oral  SpO2: 99%  Weight: 178 lb (80.7 kg)  Height: 5\' 7"  (1.702 m)   Constitutional: no acute distress Head: atraumatic ENT: external ears normal Cardiovascular: regular rate and rhythm, normal heart sounds Pulmonary: effort normal, normal breath sounds bilaterally Abdominal: flat Musculoskeletal: very small old-appearing puncture wound on plantar aspect of right foot, no erythema or edema, no other foot lesions Skin: warm and dry Neurological: alert, no focal  deficit Psychiatric: normal mood and affect  Assessment & Plan:   See Encounters Tab for problem based charting.  Patient discussed with Dr. 

## 2020-05-31 NOTE — Patient Instructions (Signed)
Thank you for allowing [name] to be a part of your care today, it was a pleasure seeing you. We discussed your shoulder pain, blood pressure, and diabetes  For your shoulder pain, I believe it is most likely muscle pain from your work Please continue tylenol as needed, you may also add ibuprofen as needed Please use a warm pack when you are home from work Let [name] know if this does not get better  We administered a tetanus vaccine.   Please call [phone] to schedule an eye exam  Please take all your medications very day,   I am checking these labs: basic metabolic panel  I have made these changes to your medications: INCREASE Xultophy to 20 units this week, then increase again to 24 units the next week. If you have blood sugars below 70, please give [name] a call and we will need to decrease the dose  Please follow up in 2 months   Thank you, and please call the Internal Medicine Clinic at [phone] if you have any questions.  Best, Dr. Imogene Burn

## 2020-05-31 NOTE — Assessment & Plan Note (Signed)
>>  ASSESSMENT AND PLAN FOR ESSENTIAL HYPERTENSION WRITTEN ON 05/31/2020 11:38 AM BY LAURENCE FONDA GRADE, MD  BP: (!) 177/94 of note, has not had her blood medications this morning as she has not eaten yet.  At last visit, increased hctz to 25mg . She endorses non-adherence with medications. Denies chest pain, palpitations, dizziness, headaches, LOC.    Had secondary HTN workup in 2021 with elevated aldosterone:renin ratio of >68.3 but had normal aldosterone ratio, likely just due to decreased creatinine from her hypertension. Further testing was not pursued. TSH was normal. CT abd/pelv without adrenal mass. Renal artery ultrasound with no evidence of renal artery stenosis in January 2022.  Medication nonadherence seems to be the most likely cause of her continued uncontrolled blood pressure.  - BMP given increase in hctz at last visit - continue hctz-losartan  25-100mg  daily - continue metoprolol  50mg  and verapamil  360mg  daily for A fib - Reinforced importance of medication adherence - Follow-up in 3 months

## 2020-05-31 NOTE — Assessment & Plan Note (Addendum)
BP: (!) 177/94 of note, has not had her blood medications this morning as she has not eaten yet.  At last visit, increased hctz to 25mg . She endorses non-adherence with medications. Denies chest pain, palpitations, dizziness, headaches, LOC.    Had secondary HTN workup in 2021 with elevated aldosterone:renin ratio of >68.3 but had normal aldosterone ratio, likely just due to decreased creatinine from her hypertension. Further testing was not pursued. TSH was normal. CT abd/pelv without adrenal mass. Renal artery ultrasound with no evidence of renal artery stenosis in January 2022.  Medication nonadherence seems to be the most likely cause of her continued uncontrolled blood pressure.  - BMP given increase in hctz at last visit - continue hctz-losartan 25-100mg  daily - continue metoprolol 50mg  and verapamil 360mg  daily for A fib - Reinforced importance of medication adherence - Follow-up in 3 months

## 2020-05-31 NOTE — Addendum Note (Signed)
Addended by: Fredderick Severance on: 05/31/2020 11:58 AM   Modules accepted: Orders

## 2020-06-01 LAB — BMP8+ANION GAP
Anion Gap: 20 mmol/L — ABNORMAL HIGH (ref 10.0–18.0)
BUN/Creatinine Ratio: 16 (ref 9–23)
BUN: 14 mg/dL (ref 6–24)
CO2: 22 mmol/L (ref 20–29)
Calcium: 9.9 mg/dL (ref 8.7–10.2)
Chloride: 96 mmol/L (ref 96–106)
Creatinine, Ser: 0.88 mg/dL (ref 0.57–1.00)
Glucose: 216 mg/dL — ABNORMAL HIGH (ref 65–99)
Potassium: 4.6 mmol/L (ref 3.5–5.2)
Sodium: 138 mmol/L (ref 134–144)
eGFR: 81 mL/min/{1.73_m2} (ref >59)

## 2020-06-04 NOTE — Progress Notes (Signed)
Internal Medicine Clinic Attending  Case discussed with Dr. Chen  At the time of the visit.  We reviewed the resident's history and exam and pertinent patient test results.  I agree with the assessment, diagnosis, and plan of care documented in the resident's note. 

## 2020-07-09 ENCOUNTER — Encounter: Payer: Self-pay | Admitting: *Deleted

## 2020-07-24 ENCOUNTER — Encounter: Payer: Self-pay | Admitting: *Deleted

## 2020-07-26 ENCOUNTER — Encounter: Payer: Self-pay | Admitting: Gastroenterology

## 2020-07-26 ENCOUNTER — Telehealth: Payer: Self-pay | Admitting: Gastroenterology

## 2020-07-26 NOTE — Telephone Encounter (Signed)
Opened in error

## 2020-08-14 ENCOUNTER — Emergency Department (HOSPITAL_COMMUNITY)
Admission: EM | Admit: 2020-08-14 | Discharge: 2020-08-14 | Disposition: A | Discharge status: Home / Self Care | Payer: BC Managed Care – PPO | Attending: Emergency Medicine | Admitting: Emergency Medicine

## 2020-08-14 ENCOUNTER — Emergency Department (HOSPITAL_COMMUNITY): Payer: BC Managed Care – PPO

## 2020-08-14 ENCOUNTER — Encounter (HOSPITAL_COMMUNITY): Payer: Self-pay

## 2020-08-14 ENCOUNTER — Other Ambulatory Visit (HOSPITAL_COMMUNITY): Payer: Self-pay

## 2020-08-14 ENCOUNTER — Other Ambulatory Visit: Payer: Self-pay | Admitting: Home Health

## 2020-08-14 DIAGNOSIS — E1159 Type 2 diabetes mellitus with other circulatory complications: Secondary | ICD-10-CM | POA: Diagnosis not present

## 2020-08-14 DIAGNOSIS — I422 Other hypertrophic cardiomyopathy: Secondary | ICD-10-CM

## 2020-08-14 DIAGNOSIS — I10 Essential (primary) hypertension: Secondary | ICD-10-CM | POA: Insufficient documentation

## 2020-08-14 DIAGNOSIS — R0789 Other chest pain: Secondary | ICD-10-CM | POA: Diagnosis not present

## 2020-08-14 DIAGNOSIS — R079 Chest pain, unspecified: Secondary | ICD-10-CM

## 2020-08-14 DIAGNOSIS — Z79899 Other long term (current) drug therapy: Secondary | ICD-10-CM | POA: Insufficient documentation

## 2020-08-14 DIAGNOSIS — Z7901 Long term (current) use of anticoagulants: Secondary | ICD-10-CM | POA: Insufficient documentation

## 2020-08-14 DIAGNOSIS — E119 Type 2 diabetes mellitus without complications: Secondary | ICD-10-CM | POA: Diagnosis not present

## 2020-08-14 DIAGNOSIS — Z7984 Long term (current) use of oral hypoglycemic drugs: Secondary | ICD-10-CM | POA: Diagnosis not present

## 2020-08-14 DIAGNOSIS — Z794 Long term (current) use of insulin: Secondary | ICD-10-CM | POA: Diagnosis not present

## 2020-08-14 DIAGNOSIS — I152 Hypertension secondary to endocrine disorders: Secondary | ICD-10-CM | POA: Diagnosis not present

## 2020-08-14 DIAGNOSIS — I480 Paroxysmal atrial fibrillation: Secondary | ICD-10-CM

## 2020-08-14 DIAGNOSIS — Z8616 Personal history of COVID-19: Secondary | ICD-10-CM | POA: Diagnosis not present

## 2020-08-14 LAB — CBC
HCT: 45.4 % (ref 36.0–46.0)
Hemoglobin: 14 g/dL (ref 12.0–15.0)
MCH: 25.1 pg — ABNORMAL LOW (ref 26.0–34.0)
MCHC: 30.8 g/dL (ref 30.0–36.0)
MCV: 81.5 fL (ref 80.0–100.0)
Platelets: 274 10*3/uL (ref 150–400)
RBC: 5.57 MIL/uL — ABNORMAL HIGH (ref 3.87–5.11)
RDW: 14.2 % (ref 11.5–15.5)
WBC: 8.3 10*3/uL (ref 4.0–10.5)
nRBC: 0 % (ref 0.0–0.2)

## 2020-08-14 LAB — TROPONIN I (HIGH SENSITIVITY)
Troponin I (High Sensitivity): 20 ng/L — ABNORMAL HIGH (ref <18)
Troponin I (High Sensitivity): 18 ng/L — ABNORMAL HIGH (ref <18)

## 2020-08-14 LAB — BASIC METABOLIC PANEL
Anion gap: 12 (ref 5–15)
BUN: 13 mg/dL (ref 6–20)
CO2: 26 mmol/L (ref 22–32)
Calcium: 9.5 mg/dL (ref 8.9–10.3)
Chloride: 96 mmol/L — ABNORMAL LOW (ref 98–111)
Creatinine, Ser: 1.04 mg/dL — ABNORMAL HIGH (ref 0.44–1.00)
GFR, Estimated: >60 mL/min (ref >60)
Glucose, Bld: 257 mg/dL — ABNORMAL HIGH (ref 70–99)
Potassium: 3.7 mmol/L (ref 3.5–5.1)
Sodium: 134 mmol/L — ABNORMAL LOW (ref 135–145)

## 2020-08-14 LAB — I-STAT BETA HCG BLOOD, ED (MC, WL, AP ONLY): I-stat hCG, quantitative: <5 m[IU]/mL (ref <5)

## 2020-08-14 MED ORDER — FENTANYL CITRATE (PF) 100 MCG/2ML IJ SOLN
25.0000 ug | Freq: Once | INTRAMUSCULAR | Status: AC
  Administered 2020-08-14: 25 ug via INTRAVENOUS
  Filled 2020-08-14: qty 2

## 2020-08-14 MED ORDER — METOPROLOL SUCCINATE ER 25 MG PO TB24
75.0000 mg | ORAL_TABLET | Freq: Every day | ORAL | 0 refills | Status: DC
  Filled 2020-08-14 – 2020-09-05 (×2): qty 90, 30d supply, fill #0

## 2020-08-14 MED ORDER — METOPROLOL SUCCINATE ER 25 MG PO TB24
75.0000 mg | ORAL_TABLET | Freq: Every day | ORAL | Status: DC
  Administered 2020-08-14: 75 mg via ORAL
  Filled 2020-08-14: qty 3

## 2020-08-14 NOTE — Consult Note (Addendum)
Cardiology Consult Notes    Patient ID: [name] MRN: [id number]; DOB: December 03, 1970   Admission date: 08/14/2020  PCP:  Delene Ruffini, MD   [facility] Providers Cardiologist:  Jenkins Rouge, MD   {  Chief Complaint:  chest pain  Patient Profile:   [name] is a 50 y.o. female with a PMH of paroxysmal atrial fibrillation on Xarelto, hypertension, HOCM, Bell's palsy, type 2 DM, who presented to the ER complaining chest pain today.  History of Present Illness:   [name] was initially diagnosed with paroxysmal atrial fibrillation in 2013, had a complaint of heart palpitation in the past. She is currently maintained metoprolol and verapamil and Xarelto.  She has been in sinus rhythm during last office visit on 12/11/2019.   She had an Echo from 04/02/2016 which revealed moderate LVH, severe focal basal hypertrophy of the septum, SAM of MV is noted, rest LVOT gradient of 29 mmHg suggestive of HOCM.  She was seen by Dr. Marlou Porch during hospitalization for heart palpitation and dizziness on 04/02/2017, started on metoprolol and recommended outpatient MRI.  She had flat low-level troponin trend, which was felt due to demand ischemia.  She reportedly had a normal stress Myoview on 3/18 2016.  She later followed up with Dr. Johnsie Cancel in the office, started on verapamil for hypertension.  Cardiac MRI from 04/17/2019 revealed hypertrophic cardiomyopathy with obstruction, sigmoid subtype, maximal tenderness 70 mm at basal anteroseptum, mild systolic anterior motion of the mitral valve with mild MR, LV outflow tract obstruction, focus of delayed myocardial enhancement seen in the basal anteroseptum, in the area of maximal wall thickness. Last echo from 12/01/2019 with EF 70 to 75%, very mild chordal systolic anterior motion, minimal LV outflow obstruction. She was last seen in the office 12/11/19, BP was doing well on current regimen, no complaints of chest pain or syncope.   She presented to  the ER today complaining of right-sided chest pain that started last night around 7 PM.  Pain is persistent on the right side of the chest and progressively getting worse. Pain is not triggered by exertion, not relieved by resting. Pain is sharp in quality, non-radiating.  She denied any fever, chills coughing,  shortness of breath, nausea, vomiting, diaphoresis, dizziness, vision change, weight gain, peripheral edema, or syncope. She states her BP was usually 497-026V systolic at home, was feeling a little nervous at ER.  She denied tobacco use or ETOH or illicit drugs use. She works for [employer], doing well with her work and denied exertional angina symptoms.   Admission diagnostic revealed mild hyponatremia 134, glucose 257, creatinine 1.04.  High sensitive troponin 20 >18.  CBC grossly unremarkable.  hCG negative.  X-ray revealed no acute finding.  EKG reveals sinus rhythm with ventricular rate of 86 bpm, old RBBB, no acute ischemic change.  She is afebrile, AP 70-80s, RR 16-18s, hypertensive with blood pressure 130/88-196/96, nonhypoxic at ED.  She was given 25 mcg of IV fentanyl at ED. Cardiology is consulted for further input.     Past Medical History:  Diagnosis Date   Bell's palsy 03/2017   Gallstones    Gestational diabetes mellitus (GDM) 2005; 2006   Headache    "monthly" (03/12/2017)   History of chest pain    History of prolonged Q-T interval on ECG    a. 09/2012 adm: QTC 506.   Hypertension    PAF (paroxysmal atrial fibrillation) ([place]) 2012   a. 07/2011 ED visit with AF->  converted with flecainide, never followed-up;  b. 05/2012 Recurrent Afib -echo: EF 65-60%, mildly dil LA. Placed on Xarelto/daily Flecainide. c. 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   Small bowel obstruction ([place]) 09/2010   resolved   Type II diabetes mellitus ([place])    a. 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 b. 05/2012 A1c 12.6.    Past Surgical History:  Procedure  Laterality Date   CESAREAN SECTION  2005; 2006   CHOLECYSTECTOMY N/A 03/25/2017   Procedure: LAPAROSCOPIC CHOLECYSTECTOMY;  Surgeon: Kinsinger, Arta Bruce, MD;  Location: [place];  Service: General;  Laterality: N/A;   [place]  02/05/2005   with mesh     Medications Prior to Admission: Prior to Admission medications   Medication Sig Start Date End Date Taking? Authorizing Provider  acetaminophen (TYLENOL) 500 MG tablet Take 500 mg by mouth every 6 (six) hours as needed for moderate pain or headache.   Yes [provider]  Empagliflozin-metFORMIN HCl 05-998 MG TABS TAKE 1 TABLET BY MOUTH 2 (TWO) TIMES DAILY. 02/12/20 02/11/21 Yes Jose Persia, MD  Insulin Degludec-Liraglutide (XULTOPHY) 100-3.6 UNIT-MG/ML SOPN Please Inject 20 units into the skin daily this week, then increase to 24 units into the skin daily next week and stay on that dose. Patient taking differently: Inject 20 Units into the skin daily. Please Inject 20 units into the skin daily this week, then increase to 24 units into the skin daily next week and stay on that dose. 05/31/20  Yes Andrew Au, MD  losartan-hydrochlorothiazide (HYZAAR) 100-25 MG tablet Take 1 tablet by mouth daily. 05/01/20  Yes Jeralyn Bennett, MD  metoprolol succinate (TOPROL-XL) 50 MG 24 hr tablet TAKE 1 TABLET BY MOUTH ONCE A DAY WITH OR IMMEDIATELY FOLLOWING A MEAL 02/13/20 02/12/21 Yes Jose Persia, MD  rivaroxaban (XARELTO) 20 MG TABS tablet TAKE 1 TABLET BY MOUTH ONCE A DAY WITH SUPPER 06/06/19  Yes Earlene Plater, MD  blood glucose meter kit and supplies KIT Dispense based on patient and insurance preference. Use up to four times daily as directed. (FOR ICD-9 250.00, 250.01). 06/14/18   Kathi Ludwig, MD  glucose blood (ONETOUCH VERIO) test strip Use 3x a day 11/28/18   Welford Roche, MD  Insulin Pen Needle (PEN NEEDLES) 32G X 4 MM MISC 1 each by Does not apply route daily. 02/13/20    Jose Persia, MD  Insulin Pen Needle 32G X 4 MM MISC USE AS DIRECTED ONCE DAILY 02/13/20   Jose Persia, MD  Lancets [facility] DELICA PLUS [id number]) MISC 1 each by Does not apply route 3 (three) times daily. 11/28/18   Santos-Sanchez, Merlene Morse, MD  verapamil (CALAN-SR) 240 MG CR tablet TAKE 1.5 TABLETS (360 MG TOTAL) BY MOUTH DAILY. Patient not taking: No sig reported 02/12/20 02/11/21  Jose Persia, MD     Allergies:   No Known Allergies  Social History:   Social History   Socioeconomic History   Marital status: Divorced    Spouse name: Not on file   Number of children: 2   Years of education: Not on file   Highest education level: Not on file  Occupational History   Occupation: HOUSEKEEPING    Employer: [employer]  Tobacco Use   Smoking status: Never   Smokeless tobacco: Never  Vaping Use   Vaping Use: Never used  Substance and Sexual Activity   Alcohol use: Never   Drug use: Never  Sexual activity: Not Currently    Partners: Male  Other Topics Concern   Not on file  Social History Narrative   ** Merged History Encounter **       ** Data from: 11/07/10 Enc Dept: [facility]   Pt has 6 sisters but doe not know their health history or status.      Mother and father unknown      Pt is an immigrant from [place], lives in [place], [place] by herself           ** Data from: 09/19/13 Enc Dept: [facility]   Lives in [place] with significant other.  Works @ [employer] in [employer].  Does not routinely exercise.   Social Determinants of Health   Financial Resource Strain: Not on file  Food Insecurity: Not on file  Transportation Needs: Not on file  Physical Activity: Not on file  Stress: Not on file  Social Connections: Not on file  Intimate Partner Violence: Not on file    Family History:   The patient's family history includes Asthma in her mother; Other in her father, sister, and sister. There is no history of Heart disease.    ROS:   Constitutional: Denied fever, chills, malaise, night sweats Eyes: Denied vision change or loss Ears/Nose/Mouth/Throat: Denied ear ache, sore throat, coughing, sinus pain Cardiovascular: see HPI  Respiratory: denied shortness of breath Gastrointestinal: Denied nausea, vomiting, abdominal pain, diarrhea Genital/Urinary: Denied dysuria, hematuria, urinary frequency/urgency Musculoskeletal: Denied muscle ache, joint pain, weakness Skin: Denied rash, wound Neuro: Denied headache, dizziness, syncope Psych: Denied history of depression/anxiety  Endocrine: history of diabetes   Physical Exam/Data:   Vitals:   08/14/20 1045 08/14/20 1145 08/14/20 1230 08/14/20 1315  BP: (!) 156/83 130/88 (!) 141/69 (!) 141/71  Pulse: 82 79 74 83  Resp: 16 16 16 17   Temp:      SpO2: 99% 100% 98% 98%   No intake or output data in the 24 hours ending 08/14/20 1340 Last 3 Weights 05/31/2020 05/01/2020 03/28/2020  Weight (lbs) 178 lb 181 lb 8 oz 177 lb 12.8 oz  Weight (kg) 80.74 kg 82.328 kg 80.65 kg     There is no height or weight on file to calculate BMI.   Vitals:  Vitals:   08/14/20 1230 08/14/20 1315  BP: (!) 141/69 (!) 141/71  Pulse: 74 83  Resp: 16 17  Temp:    SpO2: 98% 98%   General Appearance: In no apparent distress, laying in bed HEENT: Normocephalic, atraumatic. EOMs intact.  Neck: Supple, trachea midline Cardiovascular: Regular rate and rhythm, normal S1-S2,  There is a systolic crescendo that worsens with hand grip Respiratory: Resting breathing unlabored, lungs sounds clear to auscultation bilaterally, no use of accessory muscles. On room air.  No wheezes, rales or rhonchi.   Gastrointestinal: Bowel sounds positive, abdomen soft, non-tender, non-distended. Extremities: Able to move all extremities in bed without difficulty, no edema/cyanosis/clubbing Genitourinary:genital exam not performed Musculoskeletal: Normal muscle bulk and tone, no limited range of motion Skin: Intact, warm,  dry. No rashes or petechiae noted in exposed areas.  Neurologic: Alert, oriented to person, place and time. Fluent speech, no cognitive deficit, no gross focal neuro deficit Psychiatric: Normal affect. Mood is appropriate.   EKG:  The ECG at admission showed SR with rate of 86 bpm, old RBBB  Relevant CV Studies:  Echocardiogram from 12/01/2019:   1. There is a very small LV outflow gradient at rest (14 mm Hg), there is  also and end-systolic "intracavitary gradient" of 16 mmHg, due to  mid-cavity obliteration.. Left ventricular ejection fraction, by  estimation, is 70 to 75%. The left ventricle has   hyperdynamic function. The left ventricle has no regional wall motion  abnormalities. There is mild asymmetric left ventricular hypertrophy of  the basal-septal segment. Left ventricular diastolic parameters are  consistent with Grade I diastolic dysfunction (impaired relaxation). Elevated left atrial pressure.   2. Right ventricular systolic function is hyperdynamic. The right  ventricular size is normal. Tricuspid regurgitation signal is inadequate  for assessing PA pressure.   3. Left atrial size was mildly dilated.   4. There is only very mild chordal systolic anterior motion, with minimal  LV outflow obstruction. The basal septum endocardium and the corresponing  chordae to the anterio leaflet appear hyperechogenic (thickening and  possible mild calcification).  Similar changes are seen at the tip of the anterolateral papillary muscle.  There is substantial mitral annulus calcification, unexpected for age. The  mitral valve is normal in structure. No evidence of mitral valve  regurgitation.   5. Aortic valve regurgitation is not visualized. Mild aortic valve  sclerosis is present, with no evidence of aortic valve stenosis. The  changes are most prominent on the left coronary cusp, but a distinct mass  is not appreciated.   Comparison(s): No significant change from prior study.  Prior images  reviewed side by side. Overall impression is of a forme fruste of  hypertrophic obstructive cardiomyopathy, but with age-inappropriate  degenerative changes of the aortic and mitral  valves, possibly postinflammatory.    Cardiac MRI from 04/17/19:  IMPRESSION: 1. Hypertrophic cardiomyopathy with obstruction, sigmoid subtype. Maximal wall thickness 17 mm at the basal anteroseptum.   2. Mild systolic anterior motion of the mitral valve with mild mitral valve regurgitation. Left ventricular outflow tract obstruction.   3. Focus of delayed myocardial enhancement seen in the basal anteroseptum, in the area of maximal wall thickness. This focus is qualitatively <15% of the myocardial mass.   4. Aortic valve is tricuspid. Small valvular lesions not able to be assessed on this exam as noted in findings.   Stress Myoview 03/23/2014:  Normal study with no evidence of ischemia or infarction.    Laboratory Data:  High Sensitivity Troponin:   Recent Labs  Lab 08/14/20 0442 08/14/20 0650  TROPONINIHS 20* 18*      Chemistry Recent Labs  Lab 08/14/20 0442  NA 134*  K 3.7  CL 96*  CO2 26  GLUCOSE 257*  BUN 13  CREATININE 1.04*  CALCIUM 9.5  GFRNONAA >60  ANIONGAP 12    No results for input(s): PROT, ALBUMIN, AST, ALT, ALKPHOS, BILITOT in the last 168 hours. Hematology Recent Labs  Lab 08/14/20 0442  WBC 8.3  RBC 5.57*  HGB 14.0  HCT 45.4  MCV 81.5  MCH 25.1*  MCHC 30.8  RDW 14.2  PLT 274   BNPNo results for input(s): BNP, PROBNP in the last 168 hours.  DDimer No results for input(s): DDIMER in the last 168 hours.   Radiology/Studies:  DG Chest 2 View  Result Date: 08/14/2020 CLINICAL DATA:  Right-sided chest pain starting last night EXAM: CHEST - 2 VIEW COMPARISON:  12/01/2019 FINDINGS: The heart size and mediastinal contours are within normal limits. Both lungs are clear. The visualized skeletal structures are unremarkable. IMPRESSION: No active  cardiopulmonary disease. Electronically Signed   By: Van Clines M.D.   On: 08/14/2020 06:03     Assessment and  Plan:   Chest pain, atypical  - Presented with right-sided resting constant chest pain occurred 7 PM 08/13/2020, improved now with fentanyl  - HS minimally elevated 20 >18 - EKG without acute changes - stress Myoview 2016 negative  - not consistent with ACS, considering MSK pain, may try PRN tylenol  - OK to discharge today  - outpatient CT coronary study recommended per MD, will arrange follow up with Dr Johnsie Cancel office   Elevated troponin - Hs minimally elevated 20 >18 without EKG changes  - likely demand ischemia from HTN urgency   Hypertensive urgency  - BP was elevated up to 196/69 at ER, now improving without medication, likely due to anxiety  - Home BP 625-638L systolic per patient reports  - will resume home med losartan-HCTZ 100-25 mg daily, will increase metoprolol to 75 mg XL daily (will need new script),  and continue verapamil 360 mg daily  HOCM -Cardiac MRI from 04/2019 with septal thickening 17 mm, basal septum hyperenhancement -Last echo from 12/01/2019 with EF 70 to 75%, very small LVO gradient at rest 14 mmHg, very mild chordal systolic anterior motion, minimal LV outflow obstruction, no RWMA, grade I DD, no significant changes from previous study  - no hx of syncope, dizziness, exertional angina symptoms  -  follow up with primary cardiologist   Paroxysmal atrial fibrillation - currently in SR currently, no acute issue - CHA2DS2-VASc Score = NA  - continue metoprolol and verapamil for rate control and anticoagulation with Xarelto  Type 2 diabetes - managed per PCP    Risk Assessment/Risk Scores:   HEAR Score (for undifferentiated chest pain):  HEAR Score: 2{   CHADSVASC: NA in the presence of HOCM; anticoagulation is recommended   For questions or updates, please contact [place] Please consult [email or web address] for contact info under      Signed, Margie Billet, NP  08/14/2020 1:40 PM     Personally seen and examined. Agree with APP above with the following comments and with corrections made above: Briefly 50 yo F with history of HOCM, HTM with DM, PAF who presented with right sided CP. Patient notes no exertional chest pain.  Pain has resolved though course.  Denies symptoms with standing or after eating.   Exam notable for HOCM murmur. Labs notable for troponin 20-18 Personally reviewed relevant tests; CMR and Echo in the past are consistent with HCM with at least chordal SAM. Would recommend  Hypertrophic Cardiomyopathy; without obstruction. - Sigmoid Variant Peak gradient 14 (mid cavitary gradient) - without MR, Apical Aneurysm - ECG notable for RBBB, wall thickness of 17 mm - Exercise testing normal for no rhythm changes - CMR from 2021 notable for ~ 5% patchy inferoseptal LGE best seen in short axis PSIR - historical presence of  atrial fibrillation - outpatient EP monitoring is reasonable - continue CCB and increasing BB - ICD/Discussions:  EF of 70% - oupatient; CCTA would be reasonable  Patient is CP free; we have discussed red-flag symptoms for CP requiring return to ED for eval; we are working to arrange f/u  Rudean Haskell, MD Cardiologist [facility]  [address] [place], [place] [address] [phone]  2:37 PM

## 2020-08-14 NOTE — ED Provider Notes (Signed)
[facility] EMERGENCY DEPARTMENT Provider Note   CSN: [id number] Arrival date & time: 08/14/20  0423     History Chief Complaint  Patient presents with   Chest Pain    [name] is a 50 y.o. female with a past medical history of paroxysmal A. fib, diabetes,HOCM,, hypertension presenting to the ED with a chief complaint of chest pain.  Symptoms began at 7 PM on 08/13/2020.  Pain has been constant and on the right side.  He got worse this morning which prompted her visit to the ER.  It is worse when she stands.  Denies any associated shortness of breath, nausea, diaphoresis, vomiting, abdominal pain, cough, fever.  Denies history of similar symptoms in the past.  No leg swelling.  She has been compliant with her home medications.  Has not taken any medications to help with her pain.  HPI  HPI: A 50 year old patient with a history of treated diabetes presents for evaluation of chest pain. Initial onset of pain was less than one hour ago. The patient's chest pain is sharp and is not worse with exertion. The patient's chest pain is not middle- or left-sided, is not well-localized, is not described as heaviness/pressure/tightness and does not radiate to the arms/jaw/neck. The patient does not complain of nausea and denies diaphoresis. The patient has no history of stroke, has no history of peripheral artery disease, has not smoked in the past 90 days, has no relevant family history of coronary artery disease (first degree relative at less than age 29), is not hypertensive, has no history of hypercholesterolemia and does not have an elevated BMI (>=30).   Past Medical History:  Diagnosis Date   Bell's palsy 03/2017   Gallstones    Gestational diabetes mellitus (GDM) 2005; 2006   Headache    "monthly" (03/12/2017)   History of chest pain    History of prolonged Q-T interval on ECG    a. 09/2012 adm: QTC 506.   Hypertension    PAF (paroxysmal atrial fibrillation) ([place]) 2012    a. 07/2011 ED visit with AF-> converted with flecainide, never followed-up;  b. 05/2012 Recurrent Afib -echo: EF 65-60%, mildly dil LA. Placed on Xarelto/daily Flecainide. c. 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   Small bowel obstruction ([place]) 09/2010   resolved   Type II diabetes mellitus ([place])    a. 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 b. 05/2012 A1c 12.6.    Patient Active Problem List   Diagnosis Date Noted   Shoulder pain 05/31/2020   Screening for colon cancer 05/02/2020   Encounter for hepatitis C screening test for low risk patient 05/22/2019   History of COVID-19 11/2018 11/28/2018   HOCM (hypertrophic obstructive cardiomyopathy) ([place]) 04/03/2017   Bell's palsy 03/11/2017   Healthcare maintenance 01/17/2017   Atrial fibrillation ([place]) 05/23/2012   Essential hypertension 05/23/2012   Diabetes mellitus ([place]) 05/23/2012    Past Surgical History:  Procedure Laterality Date   CESAREAN SECTION  2005; 2006   CHOLECYSTECTOMY N/A 03/25/2017   Procedure: LAPAROSCOPIC CHOLECYSTECTOMY;  Surgeon: Kieth Brightly Arta Bruce, MD;  Location: [facility];  Service: General;  Laterality: N/A;   [place]  02/05/2005   with mesh     OB History   No obstetric history on file.     Family History  Problem Relation Age of Onset   Other Father  killed in [place] civil war when he was in his 47's   Asthma Mother        alive in her 38's   Other Sister        A & W   Other Sister        A & W   Heart disease Neg Hx     Social History   Tobacco Use   Smoking status: Never   Smokeless tobacco: Never  Vaping Use   Vaping Use: Never used  Substance Use Topics   Alcohol use: Never   Drug use: Never    Home Medications Prior to Admission medications   Medication Sig Start Date End Date Taking? Authorizing Provider  acetaminophen (TYLENOL) 500 MG tablet Take 500 mg by mouth every 6 (six) hours as  needed for moderate pain or headache.   Yes [provider]  Empagliflozin-metFORMIN HCl 05-998 MG TABS TAKE 1 TABLET BY MOUTH 2 (TWO) TIMES DAILY. 02/12/20 02/11/21 Yes Jose Persia, MD  Insulin Degludec-Liraglutide (XULTOPHY) 100-3.6 UNIT-MG/ML SOPN Please Inject 20 units into the skin daily this week, then increase to 24 units into the skin daily next week and stay on that dose. Patient taking differently: Inject 20 Units into the skin daily. Please Inject 20 units into the skin daily this week, then increase to 24 units into the skin daily next week and stay on that dose. 05/31/20  Yes Andrew Au, MD  losartan-hydrochlorothiazide (HYZAAR) 100-25 MG tablet Take 1 tablet by mouth daily. 05/01/20  Yes Jeralyn Bennett, MD  metoprolol succinate (TOPROL-XL) 25 MG 24 hr tablet Take 3 tablets (75 mg total) by mouth daily. 08/14/20 09/13/20 Yes Bonnie Roig, PA-C  rivaroxaban (XARELTO) 20 MG TABS tablet TAKE 1 TABLET BY MOUTH ONCE A DAY WITH SUPPER 06/06/19  Yes Earlene Plater, MD  blood glucose meter kit and supplies KIT Dispense based on patient and insurance preference. Use up to four times daily as directed. (FOR ICD-9 250.00, 250.01). 06/14/18   Kathi Ludwig, MD  glucose blood (ONETOUCH VERIO) test strip Use 3x a day 11/28/18   Welford Roche, MD  Insulin Pen Needle (PEN NEEDLES) 32G X 4 MM MISC 1 each by Does not apply route daily. 02/13/20   Jose Persia, MD  Insulin Pen Needle 32G X 4 MM MISC USE AS DIRECTED ONCE DAILY 02/13/20   Jose Persia, MD  Lancets [facility] DELICA PLUS [id number]) MISC 1 each by Does not apply route 3 (three) times daily. 11/28/18   Santos-Sanchez, Merlene Morse, MD  verapamil (CALAN-SR) 240 MG CR tablet TAKE 1.5 TABLETS (360 MG TOTAL) BY MOUTH DAILY. Patient not taking: No sig reported 02/12/20 02/11/21  Jose Persia, MD    Allergies    Patient has no known allergies.  Review of Systems   Review of Systems  Constitutional:  Negative for appetite  change, chills and fever.  HENT:  Negative for ear pain, rhinorrhea, sneezing and sore throat.   Eyes:  Negative for photophobia and visual disturbance.  Respiratory:  Negative for cough, chest tightness, shortness of breath and wheezing.   Cardiovascular:  Positive for chest pain. Negative for palpitations.  Gastrointestinal:  Negative for abdominal pain, blood in stool, constipation, diarrhea, nausea and vomiting.  Genitourinary:  Negative for dysuria, hematuria and urgency.  Musculoskeletal:  Negative for myalgias.  Skin:  Negative for rash.  Neurological:  Negative for dizziness, weakness and light-headedness.   Physical Exam Updated Vital Signs BP (!) 147/89   Pulse 81  Temp 99 F (37.2 C)   Resp 16   LMP 10/01/2006   SpO2 98%   Physical Exam Vitals and nursing note reviewed.  Constitutional:      General: She is not in acute distress.    Appearance: She is well-developed.  HENT:     Head: Normocephalic and atraumatic.     Nose: Nose normal.  Eyes:     General: No scleral icterus.       Left eye: No discharge.     Conjunctiva/sclera: Conjunctivae normal.  Cardiovascular:     Rate and Rhythm: Normal rate and regular rhythm.     Heart sounds: Normal heart sounds. No murmur heard.   No friction rub. No gallop.  Pulmonary:     Effort: Pulmonary effort is normal. No respiratory distress.     Breath sounds: Normal breath sounds.  Abdominal:     General: Bowel sounds are normal. There is no distension.     Palpations: Abdomen is soft.     Tenderness: There is no abdominal tenderness. There is no guarding.  Musculoskeletal:        General: Normal range of motion.     Cervical back: Normal range of motion and neck supple.     Right lower leg: No tenderness. No edema.     Left lower leg: No tenderness. No edema.  Skin:    General: Skin is warm and dry.     Findings: No rash.  Neurological:     Mental Status: She is alert.     Motor: No abnormal muscle tone.      Coordination: Coordination normal.    ED Results / Procedures / Treatments   Labs (all labs ordered are listed, but only abnormal results are displayed) Labs Reviewed  BASIC METABOLIC PANEL - Abnormal; Notable for the following components:      Result Value   Sodium 134 (*)    Chloride 96 (*)    Glucose, Bld 257 (*)    Creatinine, Ser 1.04 (*)    All other components within normal limits  CBC - Abnormal; Notable for the following components:   RBC 5.57 (*)    MCH 25.1 (*)    All other components within normal limits  TROPONIN I (HIGH SENSITIVITY) - Abnormal; Notable for the following components:   Troponin I (High Sensitivity) 20 (*)    All other components within normal limits  TROPONIN I (HIGH SENSITIVITY) - Abnormal; Notable for the following components:   Troponin I (High Sensitivity) 18 (*)    All other components within normal limits  I-STAT BETA HCG BLOOD, ED ([facility], [facility], [facility] ONLY)    EKG None  Radiology DG Chest 2 View  Result Date: 08/14/2020 CLINICAL DATA:  Right-sided chest pain starting last night EXAM: CHEST - 2 VIEW COMPARISON:  12/01/2019 FINDINGS: The heart size and mediastinal contours are within normal limits. Both lungs are clear. The visualized skeletal structures are unremarkable. IMPRESSION: No active cardiopulmonary disease. Electronically Signed   By: Van Clines M.D.   On: 08/14/2020 06:03    Procedures Procedures   Medications Ordered in ED Medications  metoprolol succinate (TOPROL-XL) 24 hr tablet 75 mg (75 mg Oral Given 08/14/20 1432)  fentaNYL (SUBLIMAZE) injection 25 mcg (25 mcg Intravenous Given 08/14/20 1158)    ED Course  I have reviewed the triage vital signs and the nursing notes.  Pertinent labs & imaging results that were available during my care of the patient were reviewed by me  and considered in my medical decision making (see chart for details).  Clinical Course as of 08/14/20 1520  Wed Aug 14, 2020  1012 Glucose(!): 257 [HK]   1012 Creatinine(!): 1.04 [HK]  1012 Troponin I (High Sensitivity)(!): 18 [HK]    Clinical Course User Index [HK] Delia Heady, PA-C   MDM Rules/Calculators/A&P HEAR Score: 2                         50 year old female with past medical history of PAF, diabetes, HOCM, hypertension, presenting to the ED with a chief complaint of chest pain.  Symptoms began at 7 PM last night.  Pain has been constant and right-sided but got worse this morning which prompted her visit to the ER.  Symptoms are worse with standing but not necessarily with ambulation or exertion.  No leg swelling.  She has been compliant with medication.  No shortness of breath, diaphoresis, abdominal pain, cough or vomiting.  On exam lungs are clear to auscultation bilaterally.  No lower extremity edema, erythema or calf tenderness bilaterally.  EKG without any acute changes, no STEMI.  Initial troponin is 20, delta check is 18.  Chest x-ray without any acute findings.  BMP, CBC unremarkable.  She is a established patient with cardiology. She was evaluated by cardiology at the bedside.  They recommend metoprolol increased to 75 mg daily and they will arrange for an outpatient CT coronary study as well as follow-up in the clinic.  Patient is improved here with fentanyl.  She remains hemodynamically stable and agreeable to the plan.  Low suspicion for ACS, PE.  Return precautions given.   Patient is hemodynamically stable, in NAD, and able to ambulate in the ED. Evaluation does not show pathology that would require ongoing emergent intervention or inpatient treatment. I explained the diagnosis to the patient. Pain has been managed and has no complaints prior to discharge. Patient is comfortable with above plan and is stable for discharge at this time. All questions were answered prior to disposition. Strict return precautions for returning to the ED were discussed. Encouraged follow up with PCP.   An After Visit Summary was printed and  given to the patient.   Portions of this note were generated with Lobbyist. Dictation errors may occur despite best attempts at proofreading.  Final Clinical Impression(s) / ED Diagnoses Final diagnoses:  Chest wall pain    Rx / DC Orders ED Discharge Orders          Ordered    metoprolol succinate (TOPROL-XL) 25 MG 24 hr tablet  Daily        08/14/20 1459             Delia Heady, PA-C 08/14/20 1520    Milton Ferguson, MD 08/18/20 [phone]

## 2020-08-14 NOTE — ED Triage Notes (Signed)
Pt reports that she began to have R sided CP last night, denies other cardiac symptoms.

## 2020-08-14 NOTE — Discharge Instructions (Addendum)
Increase your metoprolol to 75 mg daily as recommended by the cardiologist. You will need to follow-up in their clinic. Return to the ER if you start to experience worsening chest pain, shortness of breath, leg swelling, lightheadedness

## 2020-08-14 NOTE — Progress Notes (Signed)
Outpatient CT coronary ordered, follow up with Dr Eden Emms.

## 2020-08-15 ENCOUNTER — Encounter (HOSPITAL_COMMUNITY): Payer: Self-pay

## 2020-08-15 ENCOUNTER — Telehealth (HOSPITAL_COMMUNITY): Payer: Self-pay | Admitting: Emergency Medicine

## 2020-08-15 NOTE — Telephone Encounter (Signed)
Attempted to call patient regarding upcoming cardiac CT appointment. °Left message on voicemail with name and callback number °Mayah Urquidi RN Navigator Cardiac Imaging °[facility] °[phone] Office °[phone] Cell ° °

## 2020-08-16 ENCOUNTER — Other Ambulatory Visit: Payer: Self-pay

## 2020-08-16 ENCOUNTER — Ambulatory Visit (HOSPITAL_COMMUNITY)
Admit: 2020-08-16 | Discharge: 2020-08-16 | Disposition: A | Discharge status: Home / Self Care | Payer: BC Managed Care – PPO | Attending: Cardiovascular Disease | Admitting: Cardiovascular Disease

## 2020-08-16 DIAGNOSIS — R079 Chest pain, unspecified: Secondary | ICD-10-CM

## 2020-08-16 MED ORDER — METOPROLOL TARTRATE 5 MG/5ML IV SOLN
INTRAVENOUS | Status: AC
  Filled 2020-08-16: qty 15

## 2020-08-16 MED ORDER — IOHEXOL 350 MG/ML SOLN
100.0000 mL | Freq: Once | INTRAVENOUS | Status: AC | PRN
  Administered 2020-08-16: 100 mL via INTRAVENOUS

## 2020-08-16 MED ORDER — NITROGLYCERIN 0.4 MG SL SUBL
SUBLINGUAL_TABLET | SUBLINGUAL | Status: AC
  Filled 2020-08-16: qty 2

## 2020-08-16 MED ORDER — METOPROLOL TARTRATE 5 MG/5ML IV SOLN
5.0000 mg | INTRAVENOUS | Status: DC | PRN
  Administered 2020-08-16: 10 mg via INTRAVENOUS
  Administered 2020-08-16 (×2): 5 mg via INTRAVENOUS

## 2020-08-16 MED ORDER — DILTIAZEM HCL 25 MG/5ML IV SOLN
20.0000 mg | Freq: Once | INTRAVENOUS | Status: AC
  Administered 2020-08-16 (×2): 10 mg via INTRAVENOUS

## 2020-08-16 MED ORDER — METOPROLOL TARTRATE 5 MG/5ML IV SOLN
INTRAVENOUS | Status: AC
  Filled 2020-08-16: qty 5

## 2020-08-16 MED ORDER — NITROGLYCERIN 0.4 MG SL SUBL
0.8000 mg | SUBLINGUAL_TABLET | Freq: Once | SUBLINGUAL | Status: AC
  Administered 2020-08-16: 0.8 mg via SUBLINGUAL

## 2020-08-16 MED ORDER — DILTIAZEM HCL 25 MG/5ML IV SOLN
INTRAVENOUS | Status: AC
  Filled 2020-08-16: qty 5

## 2020-08-22 ENCOUNTER — Other Ambulatory Visit (HOSPITAL_COMMUNITY): Payer: Self-pay

## 2020-08-30 ENCOUNTER — Ambulatory Visit: Payer: BC Managed Care – PPO | Admitting: Gastroenterology

## 2020-08-30 ENCOUNTER — Ambulatory Visit: Payer: BC Managed Care – PPO | Admitting: Cardiovascular Disease

## 2020-09-05 ENCOUNTER — Other Ambulatory Visit (HOSPITAL_COMMUNITY): Payer: Self-pay

## 2020-09-05 MED FILL — Empagliflozin-Metformin HCl Tab 5-1000 MG: ORAL | 30 days supply | Qty: 60 | Fill #1 | Status: AC

## 2020-10-03 ENCOUNTER — Ambulatory Visit: Payer: BC Managed Care – PPO | Admitting: Gastroenterology

## 2020-10-07 ENCOUNTER — Encounter: Payer: Self-pay | Admitting: Student in an Organized Health Care Education/Training Program

## 2021-02-21 ENCOUNTER — Other Ambulatory Visit (HOSPITAL_COMMUNITY): Payer: Self-pay

## 2021-02-21 ENCOUNTER — Other Ambulatory Visit: Payer: Self-pay | Admitting: Internal Medicine

## 2021-02-21 DIAGNOSIS — E119 Type 2 diabetes mellitus without complications: Secondary | ICD-10-CM

## 2021-02-22 MED ORDER — SYNJARDY 5-1000 MG PO TABS
1.0000 | ORAL_TABLET | Freq: Two times a day (BID) | ORAL | 2 refills | Status: DC
  Filled 2021-02-22: qty 60, 30d supply, fill #0

## 2021-02-24 ENCOUNTER — Other Ambulatory Visit (HOSPITAL_COMMUNITY): Payer: Self-pay

## 2021-02-28 ENCOUNTER — Other Ambulatory Visit (HOSPITAL_COMMUNITY): Payer: Self-pay

## 2021-04-19 ENCOUNTER — Encounter (HOSPITAL_COMMUNITY): Payer: Self-pay | Admitting: Emergency Medicine

## 2021-04-19 ENCOUNTER — Other Ambulatory Visit: Payer: Self-pay

## 2021-04-19 ENCOUNTER — Emergency Department (HOSPITAL_COMMUNITY): Payer: Self-pay

## 2021-04-19 ENCOUNTER — Telehealth: Payer: Self-pay

## 2021-04-19 ENCOUNTER — Emergency Department (HOSPITAL_COMMUNITY)
Admission: EM | Admit: 2021-04-19 | Discharge: 2021-04-19 | Disposition: A | Discharge status: Home / Self Care | Payer: Self-pay | Attending: Emergency Medicine | Admitting: Emergency Medicine

## 2021-04-19 DIAGNOSIS — Z7984 Long term (current) use of oral hypoglycemic drugs: Secondary | ICD-10-CM | POA: Insufficient documentation

## 2021-04-19 DIAGNOSIS — I10 Essential (primary) hypertension: Secondary | ICD-10-CM | POA: Insufficient documentation

## 2021-04-19 DIAGNOSIS — Z8616 Personal history of COVID-19: Secondary | ICD-10-CM | POA: Insufficient documentation

## 2021-04-19 DIAGNOSIS — E1165 Type 2 diabetes mellitus with hyperglycemia: Secondary | ICD-10-CM | POA: Insufficient documentation

## 2021-04-19 DIAGNOSIS — I480 Paroxysmal atrial fibrillation: Secondary | ICD-10-CM

## 2021-04-19 DIAGNOSIS — Z794 Long term (current) use of insulin: Secondary | ICD-10-CM | POA: Insufficient documentation

## 2021-04-19 DIAGNOSIS — N289 Disorder of kidney and ureter, unspecified: Secondary | ICD-10-CM | POA: Insufficient documentation

## 2021-04-19 DIAGNOSIS — R739 Hyperglycemia, unspecified: Secondary | ICD-10-CM

## 2021-04-19 DIAGNOSIS — Z79899 Other long term (current) drug therapy: Secondary | ICD-10-CM | POA: Insufficient documentation

## 2021-04-19 DIAGNOSIS — Z7901 Long term (current) use of anticoagulants: Secondary | ICD-10-CM | POA: Insufficient documentation

## 2021-04-19 LAB — BASIC METABOLIC PANEL
Anion gap: 11 (ref 5–15)
BUN: 13 mg/dL (ref 6–20)
CO2: 26 mmol/L (ref 22–32)
Calcium: 9.3 mg/dL (ref 8.9–10.3)
Chloride: 96 mmol/L — ABNORMAL LOW (ref 98–111)
Creatinine, Ser: 1.05 mg/dL — ABNORMAL HIGH (ref 0.44–1.00)
GFR, Estimated: >60 mL/min (ref >60)
Glucose, Bld: 452 mg/dL — ABNORMAL HIGH (ref 70–99)
Potassium: 3.7 mmol/L (ref 3.5–5.1)
Sodium: 133 mmol/L — ABNORMAL LOW (ref 135–145)

## 2021-04-19 LAB — I-STAT BETA HCG BLOOD, ED (MC, WL, AP ONLY): I-stat hCG, quantitative: <5 m[IU]/mL (ref <5)

## 2021-04-19 LAB — CBC
HCT: 43.4 % (ref 36.0–46.0)
Hemoglobin: 14.2 g/dL (ref 12.0–15.0)
MCH: 25.9 pg — ABNORMAL LOW (ref 26.0–34.0)
MCHC: 32.7 g/dL (ref 30.0–36.0)
MCV: 79.2 fL — ABNORMAL LOW (ref 80.0–100.0)
Platelets: 289 10*3/uL (ref 150–400)
RBC: 5.48 MIL/uL — ABNORMAL HIGH (ref 3.87–5.11)
RDW: 13.2 % (ref 11.5–15.5)
WBC: 8.8 10*3/uL (ref 4.0–10.5)
nRBC: 0 % (ref 0.0–0.2)

## 2021-04-19 LAB — TROPONIN I (HIGH SENSITIVITY)
Troponin I (High Sensitivity): 29 ng/L — ABNORMAL HIGH (ref <18)
Troponin I (High Sensitivity): 30 ng/L — ABNORMAL HIGH (ref <18)

## 2021-04-19 LAB — CBG MONITORING, ED
Glucose-Capillary: 412 mg/dL — ABNORMAL HIGH (ref 70–99)
Glucose-Capillary: 329 mg/dL — ABNORMAL HIGH (ref 70–99)

## 2021-04-19 MED ORDER — LOSARTAN POTASSIUM 100 MG PO TABS
100.0000 mg | ORAL_TABLET | Freq: Every day | ORAL | 0 refills | Status: DC
  Filled 2021-04-19: qty 30, 30d supply, fill #0

## 2021-04-19 MED ORDER — VERAPAMIL HCL ER 180 MG PO TBCR
360.0000 mg | EXTENDED_RELEASE_TABLET | Freq: Once | ORAL | Status: AC
  Administered 2021-04-19: 360 mg via ORAL
  Filled 2021-04-19: qty 2

## 2021-04-19 MED ORDER — METOPROLOL SUCCINATE ER 25 MG PO TB24
75.0000 mg | ORAL_TABLET | Freq: Every day | ORAL | 0 refills | Status: DC
  Filled 2021-04-19: qty 90, 30d supply, fill #0

## 2021-04-19 MED ORDER — LOSARTAN POTASSIUM 50 MG PO TABS
100.0000 mg | ORAL_TABLET | Freq: Once | ORAL | Status: AC
  Administered 2021-04-19: 100 mg via ORAL
  Filled 2021-04-19: qty 2

## 2021-04-19 MED ORDER — EMPAGLIFLOZIN 10 MG PO TABS
10.0000 mg | ORAL_TABLET | Freq: Every day | ORAL | 0 refills | Status: DC
  Filled 2021-04-19: qty 30, 30d supply, fill #0

## 2021-04-19 MED ORDER — INSULIN ASPART 100 UNIT/ML IJ SOLN
6.0000 [IU] | Freq: Once | INTRAMUSCULAR | Status: AC
Start: 2021-04-19 — End: 2021-04-19
  Administered 2021-04-19: 6 [IU] via INTRAVENOUS

## 2021-04-19 MED ORDER — VERAPAMIL HCL ER 240 MG PO TBCR
360.0000 mg | EXTENDED_RELEASE_TABLET | Freq: Every day | ORAL | 2 refills | Status: DC
  Filled 2021-04-19: qty 45, 30d supply, fill #0

## 2021-04-19 MED ORDER — HYDROCHLOROTHIAZIDE 25 MG PO TABS
25.0000 mg | ORAL_TABLET | Freq: Once | ORAL | Status: AC
  Administered 2021-04-19: 25 mg via ORAL
  Filled 2021-04-19: qty 1

## 2021-04-19 MED ORDER — METFORMIN HCL 1000 MG PO TABS
1000.0000 mg | ORAL_TABLET | Freq: Two times a day (BID) | ORAL | 0 refills | Status: DC
Start: 2021-04-19 — End: 2021-07-07
  Filled 2021-04-19: qty 30, 15d supply, fill #0

## 2021-04-19 MED ORDER — RIVAROXABAN 20 MG PO TABS
20.0000 mg | ORAL_TABLET | Freq: Every day | ORAL | 0 refills | Status: DC
  Filled 2021-04-19: qty 30, 30d supply, fill #0

## 2021-04-19 MED ORDER — HYDROCHLOROTHIAZIDE 25 MG PO TABS
25.0000 mg | ORAL_TABLET | Freq: Every day | ORAL | 0 refills | Status: DC
  Filled 2021-04-19: qty 30, 30d supply, fill #0

## 2021-04-19 NOTE — ED Notes (Signed)
Pt ambulatory to and from restroom with steady gait 

## 2021-04-19 NOTE — Care Management (Signed)
Called patient to discuss medication assistance. Left a confidential voice message to have her call back for discussion.  ?

## 2021-04-19 NOTE — ED Triage Notes (Signed)
Patient here with chest pain, center.  Patient states that it is sharp in nature.  She tried drinking water to help it but it got worse when she laid down. She states that it comes and goes.  She denies any nausea or vomiting.  No shortness of breath. ?

## 2021-04-19 NOTE — Discharge Instructions (Signed)
I have put in a consult for social work to call you on your cell phone to help assist with filling your medications.  Please go online and look at GoodRx or SingleCare to find medication coupons. ? ?I am not able to reach out to your primary care provider, so please reach out to them and get a clinic appointment within 1-2 weeks. ?

## 2021-04-19 NOTE — Telephone Encounter (Signed)
Discussed medications with patient, she has not been able to afford medications, encouraged to seek PCP for any further medicines. Originally sent to [facility] pharmacy, however they are not open the weekend. Messaged pharmascist and called medications into [facility] on [address]. ? ?MATCH Medication Assistance Card ?Name:  [name] ?ID: [id number] ?Bin: [id number] RX Group: [id number] ?Discharge Date:  04/20/2021 ?Expiration Date:  04/27/2021 ?(must be filled within 7 days of discharge)  ?

## 2021-04-19 NOTE — ED Provider Notes (Signed)
?[facility] ?Provider Note ? ?CSN: [id number] ?Arrival date & time: 04/19/21 0119 ? ?Chief Complaint(s) ?Chest Pain ? ?HPI ?[name] is a 51 y.o. female with a past medical history listed below including paroxysmal A-fib on Xarelto, hypertension, diabetes who presents to the emergency department with 1 day of intermittent chest pain.  ?Chest pain is sharp stabbing pains in the central region.  Lasting a few seconds and self resolving.  Nonradiating and nonexertional.  No alleviating or aggravating factors.  No associated shortness of breath.  No lower extremity edema.  No prior history of DVT/PEs. ?As we speak, patient has no chest pain. ? ?Patient reports that she has been out of her home medications for 3 months due to financial constraints. ? ?The history is provided by the patient.  ? ?Past Medical History ?Past Medical History:  ?Diagnosis Date  ? Bell's palsy 03/2017  ? Gallstones   ? Gestational diabetes mellitus (GDM) 2005; 2006  ? Headache   ? "monthly" (03/12/2017)  ? History of chest pain   ? History of prolonged Q-T interval on ECG   ? a. 09/2012 adm: QTC 506.  ? Hypertension   ? PAF (paroxysmal atrial fibrillation) ([place]) 2012  ? a. 07/2011 ED visit with AF-> converted with flecainide, never followed-up;  b. 05/2012 Recurrent Afib -echo: EF 65-60%, mildly dil LA. Placed on Xarelto/daily Flecainide. c. 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  ? Small bowel obstruction ([place]) 09/2010  ? resolved  ? Type II diabetes mellitus ([place])   ? a. 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 b. 05/2012 A1c 12.6.  ? ?Patient Active Problem List  ? Diagnosis Date Noted  ? Shoulder pain 05/31/2020  ? Screening for colon cancer 05/02/2020  ? Encounter for hepatitis C screening test for low risk patient 05/22/2019  ? History of COVID-19 11/2018 11/28/2018  ? HOCM (hypertrophic obstructive cardiomyopathy) ([place]) 04/03/2017  ? Bell's palsy 03/11/2017  ? Healthcare  maintenance 01/17/2017  ? Atrial fibrillation ([place]) 05/23/2012  ? Essential hypertension 05/23/2012  ? Diabetes mellitus ([place]) 05/23/2012  ? ?Home Medication(s) ?Prior to Admission medications   ?Medication Sig Start Date End Date Taking? Authorizing Provider  ?acetaminophen (TYLENOL) 500 MG tablet Take 500 mg by mouth every 6 (six) hours as needed for moderate pain or headache.   Yes [provider]  ?empagliflozin (JARDIANCE) 10 MG TABS tablet Take 1 tablet (10 mg total) by mouth daily. 04/19/21 05/19/21 Yes Montravious Weigelt, Grayce Sessions, MD  ?hydrochlorothiazide (HYDRODIURIL) 25 MG tablet Take 1 tablet (25 mg total) by mouth daily. 04/19/21 05/19/21 Yes Mahir Prabhakar, Grayce Sessions, MD  ?losartan (COZAAR) 100 MG tablet Take 1 tablet (100 mg total) by mouth daily. 04/19/21 05/19/21 Yes Shannah Conteh, Grayce Sessions, MD  ?metFORMIN (GLUCOPHAGE) 1000 MG tablet Take 1 tablet (1,000 mg total) by mouth 2 (two) times daily. 04/19/21  Yes Gauri Galvao, Grayce Sessions, MD  ?blood glucose meter kit and supplies KIT Dispense based on patient and insurance preference. Use up to four times daily as directed. (FOR ICD-9 250.00, 250.01). 06/14/18   Kathi Ludwig, MD  ?Empagliflozin-metFORMIN HCl (SYNJARDY) 05-998 MG TABS TAKE 1 TABLET BY MOUTH 2 (TWO) TIMES DAILY. ?Patient not taking: Reported on 04/19/2021 02/22/21 02/22/22  Delene Ruffini, MD  ?glucose blood [facility] VERIO) test strip Use 3x a day 11/28/18   Welford Roche, MD  ?Insulin Degludec-Liraglutide (XULTOPHY) 100-3.6 UNIT-MG/ML SOPN Please Inject 20 units into the skin daily this week, then increase to  24 units into the skin daily next week and stay on that dose. ?Patient not taking: Reported on 04/19/2021 05/31/20   Andrew Au, MD  ?Insulin Pen Needle (PEN NEEDLES) 32G X 4 MM MISC 1 each by Does not apply route daily. 02/13/20   Jose Persia, MD  ?Insulin Pen Needle 32G X 4 MM MISC USE AS DIRECTED ONCE DAILY 02/13/20   Jose Persia, MD  ?Lancets [facility] DELICA PLUS  [id number]) [place] 1 each by Does not apply route 3 (three) times daily. 11/28/18   Welford Roche, MD  ?losartan-hydrochlorothiazide (HYZAAR) 100-25 MG tablet Take 1 tablet by mouth daily. ?Patient not taking: Reported on 04/19/2021 05/01/20   Jeralyn Bennett, MD  ?metoprolol succinate (TOPROL-XL) 25 MG 24 hr tablet Take 3 tablets (75 mg total) by mouth daily. 04/19/21 05/19/21  Fatima Blank, MD  ?rivaroxaban (XARELTO) 20 MG TABS tablet TAKE 1 TABLET BY MOUTH ONCE A DAY WITH SUPPER 04/19/21   Cardama, Grayce Sessions, MD  ?verapamil (CALAN-SR) 240 MG CR tablet TAKE 1.5 TABLETS (360 MG TOTAL) BY MOUTH DAILY. 04/19/21 04/19/22  Fatima Blank, MD  ?                                                                                                                                  ?Allergies ?Patient has no known allergies. ? ?Review of Systems ?Review of Systems ?As noted in HPI ? ?Physical Exam ?Vital Signs  ?I have reviewed the triage vital signs ?BP (!) 149/79   Pulse 85   Temp 98 ?F (36.7 ?C)   Resp 18   LMP 10/01/2006   SpO2 98%  ? ?Physical Exam ?Vitals reviewed.  ?Constitutional:   ?   General: She is not in acute distress. ?   Appearance: She is well-developed. She is not diaphoretic.  ?HENT:  ?   Head: Normocephalic and atraumatic.  ?   Nose: Nose normal.  ?Eyes:  ?   General: No scleral icterus.    ?   Right eye: No discharge.     ?   Left eye: No discharge.  ?   Conjunctiva/sclera: Conjunctivae normal.  ?   Pupils: Pupils are equal, round, and reactive to light.  ?Cardiovascular:  ?   Rate and Rhythm: Normal rate and regular rhythm.  ?   Heart sounds: No murmur heard. ?  No friction rub. No gallop.  ?Pulmonary:  ?   Effort: Pulmonary effort is normal. No respiratory distress.  ?   Breath sounds: Normal breath sounds. No stridor. No rales.  ?Abdominal:  ?   General: There is no distension.  ?   Palpations: Abdomen is soft.  ?   Tenderness: There is no abdominal tenderness.  ?Musculoskeletal:      ?   General: No tenderness.  ?   Cervical back: Normal range of motion and neck supple.  ?Skin: ?   General: Skin is warm and  dry.  ?   Findings: No erythema or rash.  ?Neurological:  ?   Mental Status: She is alert and oriented to person, place, and time.  ? ? ?ED Results and Treatments ?Labs ?(all labs ordered are listed, but only abnormal results are displayed) ?Labs Reviewed  ?BASIC METABOLIC PANEL - Abnormal; Notable for the following components:  ?    Result Value  ? Sodium 133 (*)   ? Chloride 96 (*)   ? Glucose, Bld 452 (*)   ? Creatinine, Ser 1.05 (*)   ? All other components within normal limits  ?CBC - Abnormal; Notable for the following components:  ? RBC 5.48 (*)   ? MCV 79.2 (*)   ? MCH 25.9 (*)   ? All other components within normal limits  ?CBG MONITORING, ED - Abnormal; Notable for the following components:  ? Glucose-Capillary 412 (*)   ? All other components within normal limits  ?CBG MONITORING, ED - Abnormal; Notable for the following components:  ? Glucose-Capillary 329 (*)   ? All other components within normal limits  ?TROPONIN I (HIGH SENSITIVITY) - Abnormal; Notable for the following components:  ? Troponin I (High Sensitivity) 29 (*)   ? All other components within normal limits  ?TROPONIN I (HIGH SENSITIVITY) - Abnormal; Notable for the following components:  ? Troponin I (High Sensitivity) 30 (*)   ? All other components within normal limits  ?I-STAT BETA HCG BLOOD, ED ([facility], [facility], [facility] ONLY)  ?                                                                                                                       ?EKG ? EKG Interpretation ? ?Date/Time:  Saturday April 19 2021 03:04:18 EDT ?Ventricular Rate:  80 ?PR Interval:  189 ?QRS Duration: 141 ?QT Interval:  452 ?QTC Calculation: 522 ?R Axis:   124 ?Text Interpretation: Sinus rhythm RBBB and LPFB No acute changes Confirmed by Addison Lank [id number]) on 04/19/2021 3:17:59 AM ?  ? ?  ? ?Radiology ?DG Chest 2 View ? ?Result Date:  04/19/2021 ?CLINICAL DATA:  Chest pain. EXAM: CHEST - 2 VIEW COMPARISON:  August 14, 2020 FINDINGS: The heart size and mediastinal contours are within normal limits. Very mild, stable atelectasis is seen within the left l

## 2021-04-19 NOTE — ED Notes (Signed)
Patient with afib, not anticoagulated.  SHe is supposed to be taking Xerelto, but can not afford it.  ?

## 2021-04-21 ENCOUNTER — Other Ambulatory Visit (HOSPITAL_COMMUNITY): Payer: Self-pay

## 2021-07-06 ENCOUNTER — Emergency Department (HOSPITAL_COMMUNITY): Payer: BC Managed Care – PPO

## 2021-07-06 ENCOUNTER — Emergency Department (HOSPITAL_COMMUNITY)
Admission: EM | Admit: 2021-07-06 | Discharge: 2021-07-07 | Disposition: A | Discharge status: Home / Self Care | Payer: BC Managed Care – PPO | Attending: Emergency Medicine | Admitting: Emergency Medicine

## 2021-07-06 ENCOUNTER — Encounter (HOSPITAL_COMMUNITY): Payer: Self-pay

## 2021-07-06 DIAGNOSIS — Z79899 Other long term (current) drug therapy: Secondary | ICD-10-CM | POA: Diagnosis not present

## 2021-07-06 DIAGNOSIS — R079 Chest pain, unspecified: Secondary | ICD-10-CM | POA: Diagnosis present

## 2021-07-06 DIAGNOSIS — E1165 Type 2 diabetes mellitus with hyperglycemia: Secondary | ICD-10-CM | POA: Insufficient documentation

## 2021-07-06 DIAGNOSIS — I4891 Unspecified atrial fibrillation: Secondary | ICD-10-CM | POA: Diagnosis not present

## 2021-07-06 DIAGNOSIS — I10 Essential (primary) hypertension: Secondary | ICD-10-CM | POA: Diagnosis not present

## 2021-07-06 DIAGNOSIS — Z794 Long term (current) use of insulin: Secondary | ICD-10-CM | POA: Diagnosis not present

## 2021-07-06 DIAGNOSIS — R739 Hyperglycemia, unspecified: Secondary | ICD-10-CM

## 2021-07-06 LAB — BASIC METABOLIC PANEL
Anion gap: 13 (ref 5–15)
BUN: 20 mg/dL (ref 6–20)
CO2: 25 mmol/L (ref 22–32)
Calcium: 8.9 mg/dL (ref 8.9–10.3)
Chloride: 93 mmol/L — ABNORMAL LOW (ref 98–111)
Creatinine, Ser: 1.19 mg/dL — ABNORMAL HIGH (ref 0.44–1.00)
GFR, Estimated: 56 mL/min — ABNORMAL LOW (ref >60)
Glucose, Bld: 467 mg/dL — ABNORMAL HIGH (ref 70–99)
Potassium: 4.1 mmol/L (ref 3.5–5.1)
Sodium: 131 mmol/L — ABNORMAL LOW (ref 135–145)

## 2021-07-06 LAB — CBC
HCT: 41 % (ref 36.0–46.0)
Hemoglobin: 13.5 g/dL (ref 12.0–15.0)
MCH: 27.1 pg (ref 26.0–34.0)
MCHC: 32.9 g/dL (ref 30.0–36.0)
MCV: 82.2 fL (ref 80.0–100.0)
Platelets: 270 10*3/uL (ref 150–400)
RBC: 4.99 MIL/uL (ref 3.87–5.11)
RDW: 13.6 % (ref 11.5–15.5)
WBC: 9.5 10*3/uL (ref 4.0–10.5)
nRBC: 0 % (ref 0.0–0.2)

## 2021-07-06 LAB — TROPONIN I (HIGH SENSITIVITY): Troponin I (High Sensitivity): 21 ng/L — ABNORMAL HIGH (ref <18)

## 2021-07-06 LAB — HCG, QUANTITATIVE, PREGNANCY: hCG, Beta Chain, Quant, S: 1 m[IU]/mL (ref <5)

## 2021-07-06 NOTE — ED Triage Notes (Signed)
Pt states that she has been having palpations all day, with CP, denies SOB, some dizziness, denies n/v, pt reports that she has also been out of her BP meds for the past 2 days.

## 2021-07-07 ENCOUNTER — Other Ambulatory Visit: Payer: Self-pay

## 2021-07-07 ENCOUNTER — Other Ambulatory Visit (HOSPITAL_COMMUNITY): Payer: Self-pay

## 2021-07-07 ENCOUNTER — Encounter (HOSPITAL_COMMUNITY): Payer: Self-pay | Admitting: Emergency Medicine

## 2021-07-07 LAB — CBG MONITORING, ED
Glucose-Capillary: 482 mg/dL — ABNORMAL HIGH (ref 70–99)
Glucose-Capillary: 380 mg/dL — ABNORMAL HIGH (ref 70–99)

## 2021-07-07 LAB — TROPONIN I (HIGH SENSITIVITY): Troponin I (High Sensitivity): 20 ng/L — ABNORMAL HIGH (ref <18)

## 2021-07-07 MED ORDER — RIVAROXABAN 20 MG PO TABS
20.0000 mg | ORAL_TABLET | Freq: Every day | ORAL | 0 refills | Status: DC
  Filled 2021-07-07: qty 30, 30d supply, fill #0

## 2021-07-07 MED ORDER — INSULIN ASPART 100 UNIT/ML IJ SOLN
10.0000 [IU] | Freq: Once | INTRAMUSCULAR | Status: AC
  Administered 2021-07-07: 10 [IU] via INTRAVENOUS

## 2021-07-07 MED ORDER — SODIUM CHLORIDE 0.9 % IV BOLUS
1000.0000 mL | Freq: Once | INTRAVENOUS | Status: AC
  Administered 2021-07-07: 1000 mL via INTRAVENOUS

## 2021-07-07 MED ORDER — LOSARTAN POTASSIUM 50 MG PO TABS
100.0000 mg | ORAL_TABLET | Freq: Once | ORAL | Status: AC
  Administered 2021-07-07: 100 mg via ORAL
  Filled 2021-07-07: qty 2

## 2021-07-07 MED ORDER — LOSARTAN POTASSIUM 100 MG PO TABS
100.0000 mg | ORAL_TABLET | Freq: Every day | ORAL | 0 refills | Status: DC
  Filled 2021-07-07: qty 30, 30d supply, fill #0

## 2021-07-07 MED ORDER — METOPROLOL SUCCINATE ER 25 MG PO TB24
75.0000 mg | ORAL_TABLET | Freq: Every day | ORAL | 0 refills | Status: DC
  Filled 2021-07-07: qty 90, 30d supply, fill #0

## 2021-07-07 MED ORDER — METFORMIN HCL 1000 MG PO TABS
1000.0000 mg | ORAL_TABLET | Freq: Two times a day (BID) | ORAL | 0 refills | Status: DC
Start: 2021-07-07 — End: 2021-12-11
  Filled 2021-07-07: qty 60, 30d supply, fill #0

## 2021-07-07 MED ORDER — RIVAROXABAN 10 MG PO TABS
20.0000 mg | ORAL_TABLET | Freq: Once | ORAL | Status: AC
  Administered 2021-07-07: 20 mg via ORAL
  Filled 2021-07-07: qty 2

## 2021-07-07 MED ORDER — METOPROLOL SUCCINATE ER 25 MG PO TB24
75.0000 mg | ORAL_TABLET | Freq: Once | ORAL | Status: AC
  Administered 2021-07-07: 75 mg via ORAL
  Filled 2021-07-07: qty 3

## 2021-07-07 NOTE — ED Provider Notes (Signed)
[facility] EMERGENCY DEPARTMENT Provider Note   CSN: [id number] Arrival date & time: 07/06/21  2231     History  Chief Complaint  Patient presents with   Chest Pain    [name] is a 51 y.o. female.  The history is provided by the patient and medical records.  Chest Pain  51 year old female with history of A-fib on Xarelto, HOCM, HTN, diabetes, presenting to the ED with chest pain.  States this has been ongoing all day today associated with palpitations and some lightheadedness with standing.  She denies any syncope.  Pain localized to center of chest without radiation.  She denied any nausea or vomiting.  No diaphoresis.  Reports she has been out of several home medications for the past 2 days including Cozaar, Toprol, metformin, and Xarelto.  States she now has insurance and plans to follow-up soon.  Home Medications Prior to Admission medications   Medication Sig Start Date End Date Taking? Authorizing Provider  acetaminophen (TYLENOL) 500 MG tablet Take 500 mg by mouth every 6 (six) hours as needed for moderate pain or headache.    [provider]  blood glucose meter kit and supplies KIT Dispense based on patient and insurance preference. Use up to four times daily as directed. (FOR ICD-9 250.00, 250.01). 06/14/18   Kathi Ludwig, MD  empagliflozin (JARDIANCE) 10 MG TABS tablet Take 1 tablet (10 mg total) by mouth daily. 04/19/21   Cardama, Grayce Sessions, MD  Empagliflozin-metFORMIN HCl (SYNJARDY) 05-998 MG TABS TAKE 1 TABLET BY MOUTH 2 (TWO) TIMES DAILY. Patient not taking: Reported on 04/19/2021 02/22/21 02/22/22  Delene Ruffini, MD  glucose blood [facility] VERIO) test strip Use 3x a day 11/28/18   Welford Roche, MD  hydrochlorothiazide (HYDRODIURIL) 25 MG tablet Take 1 tablet (25 mg total) by mouth daily. 04/19/21   Cardama, Grayce Sessions, MD  Insulin Degludec-Liraglutide (XULTOPHY) 100-3.6 UNIT-MG/ML SOPN Please Inject 20 units into  the skin daily this week, then increase to 24 units into the skin daily next week and stay on that dose. Patient not taking: Reported on 04/19/2021 05/31/20   Andrew Au, MD  Insulin Pen Needle (PEN NEEDLES) 32G X 4 MM MISC 1 each by Does not apply route daily. 02/13/20   Jose Persia, MD  Insulin Pen Needle 32G X 4 MM MISC USE AS DIRECTED ONCE DAILY 02/13/20   Jose Persia, MD  Lancets [facility] DELICA PLUS [id number]) MISC 1 each by Does not apply route 3 (three) times daily. 11/28/18   Santos-Sanchez, Merlene Morse, MD  losartan (COZAAR) 100 MG tablet Take 1 tablet (100 mg total) by mouth daily. 04/19/21   Cardama, Grayce Sessions, MD  losartan-hydrochlorothiazide (HYZAAR) 100-25 MG tablet Take 1 tablet by mouth daily. Patient not taking: Reported on 04/19/2021 05/01/20   Jeralyn Bennett, MD  metFORMIN (GLUCOPHAGE) 1000 MG tablet Take 1 tablet (1,000 mg total) by mouth 2 (two) times daily. 04/19/21   Fatima Blank, MD  metoprolol succinate (TOPROL-XL) 25 MG 24 hr tablet Take 3 tablets (75 mg total) by mouth daily. 04/19/21 05/19/21  Fatima Blank, MD  rivaroxaban (XARELTO) 20 MG TABS tablet Take 1 tablet (20 mg total) by mouth daily with supper 04/19/21   Cardama, Grayce Sessions, MD  verapamil (CALAN-SR) 240 MG CR tablet Take 1.5 tablets (360 mg total) by mouth daily. 04/19/21   Fatima Blank, MD      Allergies    Patient has no known allergies.    Review of  Systems   Review of Systems  Cardiovascular:  Positive for chest pain.  All other systems reviewed and are negative.   Physical Exam Updated Vital Signs BP (!) 190/101   Pulse 74   Temp 98.3 F (36.8 C)   Resp 18   LMP 10/01/2006   SpO2 97%   Physical Exam Vitals and nursing note reviewed.  Constitutional:      Appearance: She is well-developed.  HENT:     Head: Normocephalic and atraumatic.  Eyes:     Conjunctiva/sclera: Conjunctivae normal.     Pupils: Pupils are equal, round, and reactive to light.   Cardiovascular:     Rate and Rhythm: Normal rate and regular rhythm.     Heart sounds: Normal heart sounds.  Pulmonary:     Effort: Pulmonary effort is normal.     Breath sounds: Normal breath sounds.  Abdominal:     General: Bowel sounds are normal.     Palpations: Abdomen is soft.  Musculoskeletal:        General: Normal range of motion.     Cervical back: Normal range of motion.  Skin:    General: Skin is warm and dry.  Neurological:     Mental Status: She is alert and oriented to person, place, and time.     ED Results / Procedures / Treatments   Labs (all labs ordered are listed, but only abnormal results are displayed) Labs Reviewed  BASIC METABOLIC PANEL - Abnormal; Notable for the following components:      Result Value   Sodium 131 (*)    Chloride 93 (*)    Glucose, Bld 467 (*)    Creatinine, Ser 1.19 (*)    GFR, Estimated 56 (*)    All other components within normal limits  CBG MONITORING, ED - Abnormal; Notable for the following components:   Glucose-Capillary 482 (*)    All other components within normal limits  CBG MONITORING, ED - Abnormal; Notable for the following components:   Glucose-Capillary 380 (*)    All other components within normal limits  TROPONIN I (HIGH SENSITIVITY) - Abnormal; Notable for the following components:   Troponin I (High Sensitivity) 21 (*)    All other components within normal limits  TROPONIN I (HIGH SENSITIVITY) - Abnormal; Notable for the following components:   Troponin I (High Sensitivity) 20 (*)    All other components within normal limits  CBC  HCG, QUANTITATIVE, PREGNANCY    EKG None  Radiology DG Chest 2 View  Result Date: 07/06/2021 CLINICAL DATA:  cp EXAM: CHEST - 2 VIEW COMPARISON:  Cxr 04/19/21. FINDINGS: The heart and mediastinal contours are within normal limits. No focal consolidation. No pulmonary edema. No pleural effusion. No pneumothorax. No acute osseous abnormality. IMPRESSION: No active  cardiopulmonary disease. Electronically Signed   By: Iven Finn M.D.   On: 07/06/2021 23:06    Procedures Procedures    Medications Ordered in ED Medications  metoprolol succinate (TOPROL-XL) 24 hr tablet 75 mg (75 mg Oral Given 07/07/21 0232)  losartan (COZAAR) tablet 100 mg (100 mg Oral Given 07/07/21 0232)  rivaroxaban (XARELTO) tablet 20 mg (20 mg Oral Given 07/07/21 0232)  sodium chloride 0.9 % bolus 1,000 mL (0 mLs Intravenous Stopped 07/07/21 0415)  insulin aspart (novoLOG) injection 10 Units (10 Units Intravenous Given 07/07/21 0238)    ED Course/ Medical Decision Making/ A&P  Medical Decision Making Amount and/or Complexity of Data Reviewed Labs: ordered. Radiology: ordered and independent interpretation performed. ECG/medicine tests: ordered and independent interpretation performed.  Risk Prescription drug management.   51 year old female who with chest pain over the past 24 hours.  Some associated palpitations.  She is awake, alert, oriented here.  She has no focal neurologic deficits.  She is hypertensive on exam but vitals otherwise stable.  EKG today appears unchanged from prior.  Labs notable for hyperglycemia with glucose of 467, however normal bicarb and anion gap.  Not consistent with DKA.  Troponin minimally elevated at 21, appears similar compared with prior values.  Suspect large portion of her symptoms due to medication noncompliance.  We will attempt to control BP and CBG here in the ED, given dose of home meds, IV fluids.  Will obtain delta troponin.  4:09 AM Delta trop flat at 20.  CBG has improved with IVF and insulin.  BP improved, 159/80.  Patient feeling well currently.  Does not appear to have any acute end organ damage.  Appears stable for discharge.  Will refer her home BP meds and metformin.  Will follow-up with PCP.  Return here for new concerns.  Final Clinical Impression(s) / ED Diagnoses Final diagnoses:  Primary hypertension   Hyperglycemia    Rx / DC Orders ED Discharge Orders     None         Larene Pickett, PA-C 07/07/21 0535    Maudie Flakes, MD 07/07/21 [phone]

## 2021-07-07 NOTE — Discharge Instructions (Signed)
Refilled your medications for 1 month, sent to pharmacy for you.  Follow-up with your primary care doctor. Return to the ED for new or worsening symptoms.

## 2021-11-14 ENCOUNTER — Other Ambulatory Visit (HOSPITAL_COMMUNITY): Payer: Self-pay

## 2021-11-14 ENCOUNTER — Other Ambulatory Visit (HOSPITAL_COMMUNITY): Payer: Self-pay | Admitting: Emergency Medicine

## 2021-11-17 ENCOUNTER — Other Ambulatory Visit (HOSPITAL_COMMUNITY): Payer: Self-pay

## 2021-12-11 ENCOUNTER — Ambulatory Visit: Payer: BC Managed Care – PPO | Admitting: Student

## 2021-12-11 ENCOUNTER — Encounter: Payer: Self-pay | Admitting: Student

## 2021-12-11 ENCOUNTER — Other Ambulatory Visit: Payer: Self-pay

## 2021-12-11 ENCOUNTER — Ambulatory Visit (HOSPITAL_COMMUNITY)
Admission: RE | Admit: 2021-12-11 | Discharge: 2021-12-11 | Disposition: A | Discharge status: Home / Self Care | Payer: BC Managed Care – PPO | Source: Ambulatory Visit | Attending: Internal Medicine | Admitting: Internal Medicine

## 2021-12-11 ENCOUNTER — Other Ambulatory Visit (HOSPITAL_COMMUNITY): Payer: Self-pay

## 2021-12-11 VITALS — BP 183/91 | HR 77 | Temp 98.4°F | Ht 67.0 in | Wt 176.4 lb

## 2021-12-11 DIAGNOSIS — Z794 Long term (current) use of insulin: Secondary | ICD-10-CM

## 2021-12-11 DIAGNOSIS — R079 Chest pain, unspecified: Secondary | ICD-10-CM | POA: Diagnosis present

## 2021-12-11 DIAGNOSIS — I10 Essential (primary) hypertension: Secondary | ICD-10-CM

## 2021-12-11 DIAGNOSIS — I480 Paroxysmal atrial fibrillation: Secondary | ICD-10-CM

## 2021-12-11 DIAGNOSIS — E119 Type 2 diabetes mellitus without complications: Secondary | ICD-10-CM | POA: Diagnosis not present

## 2021-12-11 DIAGNOSIS — I421 Obstructive hypertrophic cardiomyopathy: Secondary | ICD-10-CM | POA: Diagnosis not present

## 2021-12-11 LAB — POCT GLYCOSYLATED HEMOGLOBIN (HGB A1C): Hemoglobin A1C: 14 % — AB (ref 4.0–5.6)

## 2021-12-11 LAB — BASIC METABOLIC PANEL
Anion gap: 11 (ref 5–15)
BUN: 11 mg/dL (ref 6–20)
CO2: 27 mmol/L (ref 22–32)
Calcium: 9.3 mg/dL (ref 8.9–10.3)
Chloride: 98 mmol/L (ref 98–111)
Creatinine, Ser: 0.96 mg/dL (ref 0.44–1.00)
GFR, Estimated: >60 mL/min (ref >60)
Glucose, Bld: 277 mg/dL — ABNORMAL HIGH (ref 70–99)
Potassium: 4.1 mmol/L (ref 3.5–5.1)
Sodium: 136 mmol/L (ref 135–145)

## 2021-12-11 LAB — TROPONIN I (HIGH SENSITIVITY): Troponin I (High Sensitivity): 11 ng/L (ref <18)

## 2021-12-11 LAB — GLUCOSE, CAPILLARY: Glucose-Capillary: 290 mg/dL — ABNORMAL HIGH (ref 70–99)

## 2021-12-11 MED ORDER — EMPAGLIFLOZIN 10 MG PO TABS
10.0000 mg | ORAL_TABLET | Freq: Every day | ORAL | 0 refills | Status: DC
  Filled 2021-12-11: qty 30, 30d supply, fill #0

## 2021-12-11 MED ORDER — INSULIN PEN NEEDLE 32G X 4 MM MISC
Freq: Every day | 12 refills | Status: DC
  Filled 2021-12-11: qty 100, 90d supply, fill #0
  Filled 2022-05-20 (×2): qty 100, 90d supply, fill #1

## 2021-12-11 MED ORDER — VERAPAMIL HCL ER 240 MG PO TBCR
360.0000 mg | EXTENDED_RELEASE_TABLET | Freq: Every day | ORAL | 2 refills | Status: DC
  Filled 2021-12-11: qty 45, 30d supply, fill #0

## 2021-12-11 MED ORDER — LOSARTAN POTASSIUM 100 MG PO TABS
100.0000 mg | ORAL_TABLET | Freq: Every day | ORAL | 0 refills | Status: DC
  Filled 2021-12-11: qty 30, 30d supply, fill #0

## 2021-12-11 MED ORDER — METOPROLOL SUCCINATE ER 25 MG PO TB24
75.0000 mg | ORAL_TABLET | Freq: Every day | ORAL | 0 refills | Status: DC
  Filled 2021-12-11: qty 90, 30d supply, fill #0

## 2021-12-11 MED ORDER — RIVAROXABAN 20 MG PO TABS
20.0000 mg | ORAL_TABLET | Freq: Every day | ORAL | 0 refills | Status: DC
Start: 2021-12-11 — End: 2022-02-06
  Filled 2021-12-11: qty 30, 30d supply, fill #0

## 2021-12-11 MED ORDER — HYDROCHLOROTHIAZIDE 25 MG PO TABS
25.0000 mg | ORAL_TABLET | Freq: Every day | ORAL | 0 refills | Status: DC
  Filled 2021-12-11: qty 30, 30d supply, fill #0

## 2021-12-11 MED ORDER — METFORMIN HCL 1000 MG PO TABS
1000.0000 mg | ORAL_TABLET | Freq: Two times a day (BID) | ORAL | 0 refills | Status: DC
  Filled 2021-12-11: qty 60, 30d supply, fill #0

## 2021-12-11 MED ORDER — INSULIN DEGLUDEC-LIRAGLUTIDE 100-3.6 UNIT-MG/ML ~~LOC~~ SOPN
PEN_INJECTOR | SUBCUTANEOUS | 4 refills | Status: DC
  Filled 2021-12-11: qty 15, 62d supply, fill #0
  Filled 2022-05-20: qty 15, 62d supply, fill #1

## 2021-12-11 MED ORDER — SYNJARDY 5-1000 MG PO TABS
1.0000 | ORAL_TABLET | Freq: Two times a day (BID) | ORAL | 2 refills | Status: DC
  Filled 2021-12-11: qty 60, 30d supply, fill #0

## 2021-12-11 NOTE — Patient Instructions (Addendum)
I was good seeing you in clinic today   Chest pain You EKG looks good today Please restart metoprolol 75 mg daily  Follow up with cardiology  [name] at [facility] [address] [place], [place] [address] [phone]  Diabetes  You A1c is elevated today Please restart xultophy 20 units daily and syndardy 1 tablet twice daily  Please check you blood sugar and bring you meter to you next appointment   Afib Please restart you xarelto 20 mg daily

## 2021-12-16 NOTE — Assessment & Plan Note (Signed)
>>  ASSESSMENT AND PLAN FOR ESSENTIAL HYPERTENSION WRITTEN ON 12/16/2021  1:34 PM BY LIANG, JESSICA, MD  BP elevated today and has not taken any of her medications in the past 1-2 months Reports chest pain. See problem for more details. No weakness, dizziness, headache, or sensory changes. Will restart her hctz 25 and losartan  100 mg daily. Follow up in 1 week.

## 2021-12-16 NOTE — Assessment & Plan Note (Addendum)
BP elevated today and has not taken any of her medications in the past 1-2 months Reports chest pain. See problem for more details. No weakness, dizziness, headache, or sensory changes. Will restart her hctz 25 and losartan 100 mg daily. Follow up in 1 week.

## 2021-12-16 NOTE — Progress Notes (Signed)
Established Patient Office Visit  Subjective   Patient ID: [name], female    DOB: 1970-11-02  Age: 51 y.o. MRN: [id number]  Chief Complaint  Patient presents with   Follow-up    Routine office visit with medication refill / chest pain x 2 days # 6 / dm    [name] is a 51 y.o. person living with a history listed below who presents to clinic for chest pain and follow up of her chronic conditions. Has not followed up in clinic recently as she did not have an appointment scheduled. Has been out of all her medications and came to clinic as pharmacy told her she needed to follow up with clinic before they can be refilled. Denies any AE to her medications lately and does not have financial barriers to her getting medications. Please refer to problem based charting for further details and assessment and plan of current problem and chronic medical conditions.    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/03/2017   Bell's palsy 03/11/2017   Healthcare maintenance 01/17/2017   Chest pain 12/13/2013   Atrial fibrillation (HCC) 05/23/2012   Essential hypertension 05/23/2012   Diabetes mellitus (HCC) 05/23/2012      Review of Systems  Constitutional:  Negative for chills and fever.  HENT:  Negative for congestion and sinus pain.   Respiratory:  Negative for shortness of breath.   Cardiovascular:  Positive for chest pain. Negative for palpitations and leg swelling.  Gastrointestinal:  Negative for abdominal pain, diarrhea and vomiting.  Genitourinary:  Negative for frequency and urgency.  Neurological:  Negative for sensory change, focal weakness and weakness.  Psychiatric/Behavioral:  Negative for depression and suicidal ideas.        Objective:     BP (!) 183/91 (BP Location: Left Arm, Cuff Size: Normal)   Pulse 77   Temp 98.4 F (36.9 C) (Oral)   Ht 5\' 7"  (1.702 m)   Wt 176 lb 6.4 oz (80 kg)   LMP 10/01/2006   SpO2 100%   BMI 27.63 kg/m  BP Readings from Last 3 Encounters:  12/11/21 (!) 183/91  07/07/21 (!) 159/80  04/19/21 (!) 142/83      Physical Exam Constitutional:      Appearance: Normal appearance.  HENT:     Head: Normocephalic and atraumatic.     Mouth/Throat:     Mouth: Mucous membranes are moist.     Pharynx: Oropharynx is clear.  Eyes:     Extraocular Movements: Extraocular movements intact.     Pupils: Pupils are equal, round, and reactive to light.  Cardiovascular:     Rate and Rhythm: Normal rate and regular rhythm.     Heart sounds: Murmur (holosystolic) heard.  Pulmonary:     Effort: Pulmonary effort is normal.     Breath sounds: No rhonchi or rales.  Abdominal:     General: Abdomen is flat. Bowel sounds are normal. There is no distension.     Palpations: Abdomen is soft.     Tenderness: There is no abdominal tenderness.  Musculoskeletal:        General: Normal range of motion.     Cervical back: Normal range of motion.     Right lower leg:  No edema.     Left lower leg: No edema.  Skin:    General: Skin is warm and dry.     Capillary Refill: Capillary refill takes less than 2 seconds.     Findings: No rash.  Neurological:     General: No focal deficit present.     Mental Status: She is alert and oriented to person, place, and time.  Psychiatric:        Mood and Affect: Mood normal.        Behavior: Behavior normal.      Results for orders placed or performed in visit on 12/11/21  Glucose, capillary  Result Value Ref Range   Glucose-Capillary 290 (H) 70 - 99 mg/dL  BMP w Anion Gap (STAT/Sunquest-performed on-site)  Result Value Ref Range   Sodium 136 135 - 145 mmol/L   Potassium 4.1 3.5 - 5.1 mmol/L   Chloride 98 98 - 111 mmol/L   CO2 27 22 - 32 mmol/L    Glucose, Bld 277 (H) 70 - 99 mg/dL   BUN 11 6 - 20 mg/dL   Creatinine, Ser 2.84 0.44 - 1.00 mg/dL   Calcium 9.3 8.9 - 13.2 mg/dL   GFR, Estimated >44 >01 mL/min   Anion gap 11 5 - 15  POC Hbg A1C  Result Value Ref Range   Hemoglobin A1C 14.0 (A) 4.0 - 5.6 %   HbA1c POC (<> result, manual entry)     HbA1c, POC (prediabetic range)     HbA1c, POC (controlled diabetic range)    Troponin I (High Sensitivity)  Result Value Ref Range   Troponin I (High Sensitivity) 11 <18 ng/L    Last metabolic panel Lab Results  Component Value Date   GLUCOSE 277 (H) 12/11/2021   NA 136 12/11/2021   K 4.1 12/11/2021   CL 98 12/11/2021   CO2 27 12/11/2021   BUN 11 12/11/2021   CREATININE 0.96 12/11/2021   GFRNONAA >60 12/11/2021   CALCIUM 9.3 12/11/2021   PHOS 4.3 06/14/2018   PROT 7.4 12/02/2019   ALBUMIN 3.5 12/02/2019   BILITOT 0.5 12/02/2019   ALKPHOS 88 12/02/2019   AST 28 12/02/2019   ALT 31 12/02/2019   ANIONGAP 11 12/11/2021      The ASCVD Risk score (Arnett DK, et al., 2019) failed to calculate for the following reasons:   The valid total cholesterol range is 130 to 320 mg/dL    Assessment & Plan:   Problem List Items Addressed This Visit       Cardiovascular and Mediastinum   HOCM (hypertrophic obstructive cardiomyopathy) (HCC) (Chronic)    Has not seen cardiology since 2021. Low LY outflow gradient . Will restart her metoprolol 75 mg daily. Given contact information for cardiology to set up follow up.       Relevant Medications   losartan (COZAAR) 100 MG tablet   hydrochlorothiazide (HYDRODIURIL) 25 MG tablet   metoprolol succinate (TOPROL-XL) 25 MG 24 hr tablet   rivaroxaban (XARELTO) 20 MG TABS tablet   Atrial fibrillation (HCC)    EKG in NSR today. Ran out of her Xarelto will reorder this for her.       Relevant Medications   losartan (COZAAR) 100 MG tablet   hydrochlorothiazide (HYDRODIURIL) 25 MG tablet   metoprolol succinate (TOPROL-XL) 25 MG 24 hr  tablet   rivaroxaban (XARELTO) 20 MG TABS tablet   Essential hypertension    BP elevated today and has not taken any of her medications in  the past 1-2 months Reports chest pain. See problem for more details. No weakness, dizziness, headache, or sensory changes. Will restart her hctz 25 and losartan 100 mg daily. Follow up in 1 week.       Relevant Medications   losartan (COZAAR) 100 MG tablet   hydrochlorothiazide (HYDRODIURIL) 25 MG tablet   metoprolol succinate (TOPROL-XL) 25 MG 24 hr tablet   rivaroxaban (XARELTO) 20 MG TABS tablet     Endocrine   Diabetes mellitus (HCC)    A1c 14 today. Not taking her diabetes medications after running out. State she did not know she needed to continue her medications after running. Denies any side effects of her medications. Not having symptoms aside from chest pain. Not having difficulties   -restart xultophy 20 units daily and synjardy 5-1000mg  twice daily -instructed patient to check blood sugars and bring meter to her next visit -will need microalbumin, foot exam, and opthalmology referral at next visit.      Relevant Medications   Empagliflozin-metFORMIN HCl (SYNJARDY) 05-998 MG TABS   losartan (COZAAR) 100 MG tablet   Insulin Degludec-Liraglutide (XULTOPHY) 100-3.6 UNIT-MG/ML SOPN   Other Relevant Orders   POC Hbg A1C (Completed)   BMP w Anion Gap (STAT/Sunquest-performed on-site) (Completed)     Other   Chest pain - Primary   Relevant Orders   EKG 12-Lead (Completed)   Troponin I (High Sensitivity) (Completed)    Return in about 1 week (around 12/18/2021).    Quincy Simmonds, MD

## 2021-12-16 NOTE — Assessment & Plan Note (Signed)
Has not seen cardiology since 2021. Low LY outflow gradient . Will restart her metoprolol 75 mg daily. Given contact information for cardiology to set up follow up.

## 2021-12-16 NOTE — Assessment & Plan Note (Signed)
EKG in NSR today. Ran out of her Xarelto will reorder this for her.

## 2021-12-16 NOTE — Assessment & Plan Note (Signed)
Chest pain for the past 1-2 months. Describes aching pain in the mid sternum radiating to the right chest and under her left breast. Sitting and resting seem to improve the pain, but can also occur at rest. Pain lasts 5-10 minutes at a time. ED visit in July for similar chest pain. EKG and troponin at that time unremarkable. EKG in clinic today redemonstrates RBBB but no acute ischemic changes. Troponin is negative at 11. Does not suspect ACS. Not having respiratory or palpations.  Has multiple contributors to chest pain including hypertrophic cardiomyopathy, uncontrolled diabetes, Afib, and hypertension. Has been out of her medications for at least 1 month but others or longer. Will restart home medications. Given instruction for her to follow up with her  cardiologist and clinic follow up in 1 week.

## 2021-12-16 NOTE — Assessment & Plan Note (Signed)
A1c 14 today. Not taking her diabetes medications after running out. State she did not know she needed to continue her medications after running. Denies any side effects of her medications. Not having symptoms aside from chest pain. Not having difficulties   -restart xultophy 20 units daily and synjardy 5-1000mg  twice daily -instructed patient to check blood sugars and bring meter to her next visit -will need microalbumin, foot exam, and opthalmology referral at next visit.

## 2021-12-18 ENCOUNTER — Ambulatory Visit (INDEPENDENT_AMBULATORY_CARE_PROVIDER_SITE_OTHER): Payer: BC Managed Care – PPO | Admitting: Student

## 2021-12-18 ENCOUNTER — Encounter: Payer: Self-pay | Admitting: Student

## 2021-12-18 ENCOUNTER — Other Ambulatory Visit (HOSPITAL_COMMUNITY): Payer: Self-pay

## 2021-12-18 ENCOUNTER — Other Ambulatory Visit: Payer: Self-pay

## 2021-12-18 VITALS — BP 179/96 | HR 75 | Temp 98.5°F | Resp 24 | Ht 67.0 in | Wt 177.7 lb

## 2021-12-18 DIAGNOSIS — Z0000 Encounter for general adult medical examination without abnormal findings: Secondary | ICD-10-CM

## 2021-12-18 DIAGNOSIS — Z794 Long term (current) use of insulin: Secondary | ICD-10-CM | POA: Diagnosis not present

## 2021-12-18 DIAGNOSIS — I10 Essential (primary) hypertension: Secondary | ICD-10-CM

## 2021-12-18 DIAGNOSIS — E119 Type 2 diabetes mellitus without complications: Secondary | ICD-10-CM

## 2021-12-18 DIAGNOSIS — Z1231 Encounter for screening mammogram for malignant neoplasm of breast: Secondary | ICD-10-CM

## 2021-12-18 MED ORDER — ACCU-CHEK SOFTCLIX LANCETS MISC
12 refills | Status: DC
  Filled 2021-12-18: qty 100, 100d supply, fill #0

## 2021-12-18 MED ORDER — ACCU-CHEK GUIDE VI STRP
1.0000 | ORAL_STRIP | Freq: Every day | 3 refills | Status: DC
  Filled 2021-12-18: qty 100, 100d supply, fill #0

## 2021-12-18 MED ORDER — VERAPAMIL HCL ER 120 MG PO TBCR
120.0000 mg | EXTENDED_RELEASE_TABLET | Freq: Every day | ORAL | 11 refills | Status: DC
  Filled 2021-12-18 – 2022-05-20 (×2): qty 30, 30d supply, fill #0
  Filled 2022-09-18: qty 30, 30d supply, fill #1

## 2021-12-18 MED ORDER — ACCU-CHEK GUIDE W/DEVICE KIT
1.0000 | PACK | Freq: Once | 0 refills | Status: AC
Start: 2021-12-18 — End: 2021-12-18
  Filled 2021-12-18: qty 1, 1d supply, fill #0

## 2021-12-18 NOTE — Patient Instructions (Addendum)
You blood pressure is high please start verapamil 120 mg daily  Diabetes Continue you medications  I will order you a new meter Please check you blood sugar once a day in the morning before eating and bring to you next visit   Follow up with cardiology [facility] [address] [place],  [place]  [address] Get Driving Directions Main: [phone]  Follow up in 6 weeks

## 2021-12-19 LAB — BMP8+ANION GAP
Anion Gap: 19 mmol/L — ABNORMAL HIGH (ref 10.0–18.0)
BUN/Creatinine Ratio: 16 (ref 9–23)
BUN: 14 mg/dL (ref 6–24)
CO2: 23 mmol/L (ref 20–29)
Calcium: 9.7 mg/dL (ref 8.7–10.2)
Chloride: 97 mmol/L (ref 96–106)
Creatinine, Ser: 0.87 mg/dL (ref 0.57–1.00)
Glucose: 245 mg/dL — ABNORMAL HIGH (ref 70–99)
Potassium: 4 mmol/L (ref 3.5–5.2)
Sodium: 139 mmol/L (ref 134–144)
eGFR: 81 mL/min/{1.73_m2} (ref >59)

## 2021-12-19 NOTE — Assessment & Plan Note (Signed)
BP remains elevated today at 167/94 and 179/96 on repeat. Took her losartan and hctz this morning. Denies any asymptomatic today. No longer having chest pain since last visit. Will restart verapamil 120 mg daily. She will keep a BP log and follow up in 6 weeks. BMP today.

## 2021-12-19 NOTE — Progress Notes (Signed)
Internal Medicine Clinic Attending ? ?Case discussed with Dr. Liang  At the time of the visit.  We reviewed the resident?s history and exam and pertinent patient test results.  I agree with the assessment, diagnosis, and plan of care documented in the resident?s note. ? ?

## 2021-12-19 NOTE — Assessment & Plan Note (Signed)
>>  ASSESSMENT AND PLAN FOR ESSENTIAL HYPERTENSION WRITTEN ON 12/19/2021  8:37 AM BY LIANG, JESSICA, MD  BP remains elevated today at 167/94 and 179/96 on repeat. Took her losartan  and hctz this morning. Denies any asymptomatic today. No longer having chest pain since last visit. Will restart verapamil  120 mg daily. She will keep a BP log and follow up in 6 weeks. BMP today.

## 2021-12-19 NOTE — Assessment & Plan Note (Signed)
States her meter is broken. Was able to pick up synjardy and xultophy. Denies symptoms of lows. Will order her a new meter and supplies. She will start checking fasting glucoses daily and bring meter to her next appointment.  Referral to ophthalmology ordered.

## 2021-12-19 NOTE — Progress Notes (Signed)
Established Patient Office Visit  Subjective   Patient ID: [name], female    DOB: 04-04-1970  Age: 51 y.o. MRN: [id number]  Chief Complaint  Patient presents with   Follow-up    [name] is a 51 y.o. person living with a history listed below who presents to clinic for follow up of diabetes and hypertension. Please refer to problem based charting for further details and assessment and plan of current problem and chronic medical conditions.     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/03/2017   Bell's palsy 03/11/2017   Healthcare maintenance 01/17/2017   Chest pain 12/13/2013   Atrial fibrillation (HCC) 05/23/2012   Essential hypertension 05/23/2012   Diabetes mellitus (HCC) 05/23/2012     ROS: negative as per HPI    Objective:     BP (!) 179/96 (BP Location: Right Arm, Cuff Size: Normal)   Pulse 75   Temp 98.5 F (36.9 C) (Oral)   Resp (!) 24   Ht 5\' 7"  (1.702 m)   Wt 177 lb 11.2 oz (80.6 kg)   LMP 10/01/2006   SpO2 100% Comment: room air  BMI 27.83 kg/m  BP Readings from Last 3 Encounters:  12/18/21 (!) 179/96  12/11/21 (!) 183/91  07/07/21 (!) 159/80      Physical Exam Constitutional:      Appearance: Normal appearance. She is not ill-appearing.  HENT:     Mouth/Throat:     Mouth: Mucous membranes are moist.     Pharynx: Oropharynx is clear.  Eyes:     Extraocular Movements: Extraocular movements intact.     Pupils: Pupils are equal, round, and reactive to light.  Cardiovascular:     Rate and Rhythm: Normal rate.     Pulses: Normal pulses.     Heart sounds: No murmur heard.    Comments: Systolic murmur best heart at [facility] Pulmonary:     Effort: Pulmonary effort is normal.      Breath sounds: No rhonchi or rales.  Abdominal:     General: Abdomen is flat. Bowel sounds are normal. There is no distension.     Palpations: Abdomen is soft.     Tenderness: There is no abdominal tenderness.  Musculoskeletal:        General: Normal range of motion.     Right lower leg: No edema.     Left lower leg: No edema.  Skin:    General: Skin is warm and dry.     Capillary Refill: Capillary refill takes less than 2 seconds.  Neurological:     General: No focal deficit present.     Mental Status: She is alert and oriented to person, place, and time.  Psychiatric:        Mood and Affect: Mood normal.        Behavior: Behavior normal.      No results found for any visits on 12/18/21.  Last metabolic panel Lab Results  Component Value Date   GLUCOSE 277 (H) 12/11/2021   NA 136 12/11/2021   K 4.1 12/11/2021   CL 98 12/11/2021   CO2 27 12/11/2021   BUN 11 12/11/2021   CREATININE 0.96 12/11/2021   GFRNONAA >60 12/11/2021   CALCIUM 9.3  12/11/2021   PHOS 4.3 06/14/2018   PROT 7.4 12/02/2019   ALBUMIN 3.5 12/02/2019   BILITOT 0.5 12/02/2019   ALKPHOS 88 12/02/2019   AST 28 12/02/2019   ALT 31 12/02/2019   ANIONGAP 11 12/11/2021   Last hemoglobin A1c Lab Results  Component Value Date   HGBA1C 14.0 (A) 12/11/2021      The ASCVD Risk score (Arnett DK, et al., 2019) failed to calculate for the following reasons:   The valid total cholesterol range is 130 to 320 mg/dL    Assessment & Plan:   Problem List Items Addressed This Visit       Cardiovascular and Mediastinum   Essential hypertension    BP remains elevated today at 167/94 and 179/96 on repeat. Took her losartan and hctz this morning. Denies any asymptomatic today. No longer having chest pain since last visit. Will restart verapamil 120 mg daily. She will keep a BP log and follow up in 6 weeks. BMP today.      Relevant Medications   verapamil (CALAN-SR) 120 MG CR tablet   Other Relevant Orders    BMP8+Anion Gap     Endocrine   Diabetes mellitus (HCC) - Primary   Relevant Orders   Ambulatory referral to Ophthalmology     Other   Healthcare maintenance    Mammogram ordered      Other Visit Diagnoses     Encounter for screening mammogram for malignant neoplasm of breast       Relevant Orders   MM Digital Screening       Return in about 6 weeks (around 01/29/2022).    Quincy Simmonds, MD

## 2021-12-19 NOTE — Assessment & Plan Note (Signed)
Mammogram ordered

## 2021-12-30 ENCOUNTER — Other Ambulatory Visit (HOSPITAL_COMMUNITY): Payer: Self-pay
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
## 2022-01-29 ENCOUNTER — Encounter: Payer: BC Managed Care – PPO | Admitting: Internal Medicine

## 2022-02-02 ENCOUNTER — Ambulatory Visit: Payer: BC Managed Care – PPO | Attending: Cardiovascular Disease | Admitting: Cardiovascular Disease

## 2022-02-02 ENCOUNTER — Encounter: Payer: Self-pay | Admitting: Cardiovascular Disease

## 2022-02-02 VITALS — BP 168/90 | HR 65 | Ht 67.0 in | Wt 174.1 lb

## 2022-02-02 DIAGNOSIS — I10 Essential (primary) hypertension: Secondary | ICD-10-CM

## 2022-02-02 DIAGNOSIS — I421 Obstructive hypertrophic cardiomyopathy: Secondary | ICD-10-CM

## 2022-02-02 NOTE — Patient Instructions (Addendum)
Medication Instructions:  Your physician recommends that you continue on your current medications as directed. Please refer to the Current Medication list given to you today.  *If you need a refill on your cardiac medications before your next appointment, please call your pharmacy*  Lab Work: If you have labs (blood work) drawn today and your tests are completely normal, you will receive your results only by: MyChart Message (if you have MyChart) OR A paper copy in the mail If you have any lab test that is abnormal or we need to change your treatment, we will call you to review the results.  Testing/Procedures: None ordered today.  Follow-Up: At [facility], you and your health needs are our priority.  As part of our continuing mission to provide you with exceptional heart care, we have created designated Provider Care Teams.  These Care Teams include your primary Cardiologist (physician) and Advanced Practice Providers (APPs -  Physician Assistants and Nurse Practitioners) who all work together to provide you with the care you need, when you need it.  We recommend signing up for the patient portal called "MyChart".  Sign up information is provided on this After Visit Summary.  MyChart is used to connect with patients for Virtual Visits (Telemedicine).  Patients are able to view lab/test results, encounter notes, upcoming appointments, etc.  Non-urgent messages can be sent to your provider as well.   To learn more about what you can do with MyChart, go to [email or web address].    Your next appointment:   1 year(s)  Provider:   Peter Nishan, MD      

## 2022-02-06 ENCOUNTER — Ambulatory Visit: Payer: BC Managed Care – PPO | Admitting: Internal Medicine

## 2022-02-06 ENCOUNTER — Other Ambulatory Visit (HOSPITAL_COMMUNITY): Payer: Self-pay

## 2022-02-06 ENCOUNTER — Encounter: Payer: Self-pay | Admitting: Internal Medicine

## 2022-02-06 ENCOUNTER — Other Ambulatory Visit: Payer: Self-pay

## 2022-02-06 VITALS — BP 161/91 | HR 69

## 2022-02-06 DIAGNOSIS — I10 Essential (primary) hypertension: Secondary | ICD-10-CM | POA: Diagnosis not present

## 2022-02-06 DIAGNOSIS — E119 Type 2 diabetes mellitus without complications: Secondary | ICD-10-CM

## 2022-02-06 DIAGNOSIS — Z794 Long term (current) use of insulin: Secondary | ICD-10-CM | POA: Diagnosis not present

## 2022-02-06 DIAGNOSIS — Z7984 Long term (current) use of oral hypoglycemic drugs: Secondary | ICD-10-CM | POA: Diagnosis not present

## 2022-02-06 MED ORDER — METOPROLOL SUCCINATE ER 25 MG PO TB24
75.0000 mg | ORAL_TABLET | Freq: Every day | ORAL | 0 refills | Status: DC
  Filled 2022-02-06 – 2022-04-03 (×2): qty 90, 30d supply, fill #0

## 2022-02-06 MED ORDER — HYDROCHLOROTHIAZIDE 25 MG PO TABS
25.0000 mg | ORAL_TABLET | Freq: Every day | ORAL | 0 refills | Status: DC
  Filled 2022-02-06: qty 30, 30d supply, fill #0

## 2022-02-06 MED ORDER — RIVAROXABAN 20 MG PO TABS
20.0000 mg | ORAL_TABLET | Freq: Every day | ORAL | 0 refills | Status: DC
  Filled 2022-02-06: qty 30, 30d supply, fill #0

## 2022-02-06 MED ORDER — LOSARTAN POTASSIUM 100 MG PO TABS
100.0000 mg | ORAL_TABLET | Freq: Every day | ORAL | 0 refills | Status: DC
  Filled 2022-02-06 – 2022-05-20 (×2): qty 30, 30d supply, fill #0

## 2022-02-06 MED ORDER — SYNJARDY 5-1000 MG PO TABS
1.0000 | ORAL_TABLET | Freq: Two times a day (BID) | ORAL | 2 refills | Status: DC
  Filled 2022-02-06 – 2022-05-20 (×2): qty 60, 30d supply, fill #0

## 2022-02-06 NOTE — Progress Notes (Unsigned)
   CC: 6 wk fu  HPI:[name] is a 52 y.o. female who presents for evaluation of 6 week follow up. Please see individual problem based A/P for details.  Depression, PHQ-9: Based on the patients  Mignon Visit from 02/06/2022 in [place]  PHQ-9 Total Score 0      score we have .  Past Medical History:  Diagnosis Date   Bell's palsy 03/2017   Gallstones    Gestational diabetes mellitus (GDM) 2005; 2006   Headache    "monthly" (03/12/2017)   History of chest pain    History of prolonged Q-T interval on ECG    a. 09/2012 adm: QTC 506.   Hypertension    PAF (paroxysmal atrial fibrillation) ([place]) 2012   a. 07/2011 ED visit with AF-> converted with flecainide, never followed-up;  b. 05/2012 Recurrent Afib -echo: EF 65-60%, mildly dil LA. Placed on Xarelto/daily Flecainide. c. 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   Small bowel obstruction ([place]) 09/2010   resolved   Type II diabetes mellitus ([place])    a. 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 b. 05/2012 A1c 12.6.   Review of Systems:   See hpi  Physical Exam: Vitals:   02/06/22 1025  BP: (!) 161/91  Pulse: 69   General: nad HEENT: Conjunctiva nl , antiicteric sclerae, moist mucous membranes, no exudate or erythema Cardiovascular: Normal rate, regular rhythm.  Holosystolic murmur. Good pedal pusles Pulmonary : Equal breath sounds, No wheezes, rales, or rhonchi Abdominal: soft, nontender,  bowel sounds present Ext: No edema in lower extremities, no tenderness to palpation of lower extremities.    Assessment & Plan:   See Encounters Tab for problem based charting.  Patient discussed with Dr.  Saverio Danker

## 2022-02-06 NOTE — Patient Instructions (Signed)
Dear [name],  Thank you for trusting [place] with your care today. We discussed your medications and blood pressure. We would like for you to check your blood pressure at home and write down the recordings.  Please fill your medications at the [facility]. We would like for you to bring these medications to your follow up appointment so that we can help you. We would like to see you back in 1 week.

## 2022-02-08 ENCOUNTER — Encounter: Payer: Self-pay | Admitting: Internal Medicine

## 2022-02-08 NOTE — Assessment & Plan Note (Signed)
>>  ASSESSMENT AND PLAN FOR ESSENTIAL HYPERTENSION WRITTEN ON 02/08/2022  8:36 PM BY GRIFFITH PORTER, MD  Patient currently prescribed losatan 100, hctz 25, metop 75, and was started on verapimil 120 last visit. She was instructed to keep a BP log and follow up. Recent cardiology visit showed BP still high but she admitted to not taking her medicatins. They did comment that her BP does okay at home. However patient admits to us  that although she has cuff, she does not check BP at home. Cardiology prefers to avoid diuretics in this patient due to hx of HOCM. She does admit that she does not regularly take medications and has not routinely filled Rx. She uses [facility] outpatient pharmacy and no others. She denies limitations in transportation and says that she is able to afford her rxs. She states she thought she was supposed to wait for appointment before picking up medications.   In office, patient was noted to be hypertensive. She states she takes her BP meds typically in the morning, but did not take them yet this AM. She said she brought them with her so we instructed her to take them, however, patient brought medications in un-labeled bottle to visit. She is unable to recall medications or her regimen so it is unclear which medications she has been taking and whether or not they are benefiting her BP.  So will have patient return in 1 week for close follow up so that we can do full medication reconciliation with her. She would benefit from blister packs, but need to confirm her regimen first. Once we know what she is taking, we can make adjustments at that time. If BP remains high on currently prescribed regimen, could increase verapamil  to 180.

## 2022-02-08 NOTE — Assessment & Plan Note (Addendum)
Patient currently prescribed losatan 100, hctz 25, metop 75, and was started on verapimil 120 last visit. She was instructed to keep a BP log and follow up. Recent cardiology visit showed BP still high but she admitted to not taking her medicatins. They did comment that her BP does okay at home. However patient admits to [place] that although she has cuff, she does not check BP at home. Cardiology prefers to avoid diuretics in this patient due to hx of HOCM. She does admit that she does not regularly take medications and has not routinely filled Rx. She uses [facility] and no others. She denies limitations in transportation and says that she is able to afford her rxs. She states she thought she was supposed to wait for appointment before picking up medications.   In office, patient was noted to be hypertensive. She states she takes her BP meds typically in the morning, but did not take them yet this AM. She said she brought them with her so we instructed her to take them, however, patient brought medications in un-labeled bottle to visit. She is unable to recall medications or her regimen so it is unclear which medications she has been taking and whether or not they are benefiting her BP.  So will have patient return in 1 week for close follow up so that we can do full medication reconciliation with her. She would benefit from blister packs, but need to confirm her regimen first. Once we know what she is taking, we can make adjustments at that time. If BP remains high on currently prescribed regimen, could increase verapamil to 180.

## 2022-02-08 NOTE — Assessment & Plan Note (Addendum)
Patient was referred to phtho for diabetic eye exam at last OV but did not go. She has health insurance through [place], but did not apply for eye or dental benefits so she is waiting until October to reapply and plans to follow up with ophthalmology at that time.  She is on [place]. Has glucometer kit at home but does not routinely check CBG.   No wounds on feet with strong pedal pulses.   Last A1c 14. DM very uncontrolled. Will refer to diabetic educator to assist with retinal scan, diet education, and consideration of CGM since patient also appears to struggle to check CBG regularly. Due for Urine ACR so we will collect this as well. Will continue losartan to minimize proteinuria.

## 2022-02-09 NOTE — Progress Notes (Signed)
Internal Medicine Clinic Attending ° °Case discussed with Dr. Gawaluck  At the time of the visit.  We reviewed the resident’s history and exam and pertinent patient test results.  I agree with the assessment, diagnosis, and plan of care documented in the resident’s note.  °

## 2022-02-10 ENCOUNTER — Other Ambulatory Visit: Payer: Self-pay | Admitting: Internal Medicine

## 2022-02-10 ENCOUNTER — Encounter: Payer: Self-pay | Admitting: Internal Medicine

## 2022-02-10 LAB — MICROALBUMIN / CREATININE URINE RATIO
Creatinine, Urine: 84 mg/dL
Microalb/Creat Ratio: 41 mg/g creat — ABNORMAL HIGH (ref 0–29)
Microalbumin, Urine: 34.4 ug/mL

## 2022-02-10 NOTE — Progress Notes (Signed)
Urine ACR with moderately elevated microalb/Cr likely in the setting of poorly controlled DM. Will need to optimize DM management. Patient currently on max dose ACEi but has not been compliant with this. Will need to ensure she is taking this medication as prescribed as well to minimize proteinuria. She is scheduled for follow up appointment tomorrow for medication reconcilliation

## 2022-02-12 ENCOUNTER — Encounter: Payer: BC Managed Care – PPO | Admitting: Internal Medicine

## 2022-02-12 NOTE — Progress Notes (Deleted)
   CC: ***  HPI:[name] is a 52 y.o. female who presents for evaluation of ***. Please see individual problem based A/P for details.  46 yof with hx hocm, afib,   Medication assistance/reconcilliation  f/u She uses [facility] and no others. She denies limitations in transportation and says that she is able to afford her rxs. She states she thought she was supposed to wait for appointment before picking up medications.   So will have patient return in 1 week for close follow up so that we can do full medication reconciliation with her. She would benefit from blister packs, but need to confirm her regimen first. Once we know what she is taking, we can make adjustments at that time.  - can patient read? - afford? - transport? - why didn't we refer to CM?  - blister pack through [place] for delivery  She was instructed to keep a BP log and follow up  has cuff, she does not check BP at home  Cardiology prefers to avoid diuretics in this patient due to hx of HOCM.  - losartan 100, hctz 25, verapimil 120 (new and never taken and was started in setting of noncompliance), metop 75  Xultophy and Synjardy are prescribed. We referred to [facility] but doesn't seem to have gone. She had proteinuria on acr and needs to control bp and dm.    Afib - would need to restart titration  HM - due for pap and colon  Depression, PHQ-9: Based on the patients  Dana Visit from 02/06/2022 in [place]  PHQ-9 Total Score 0      score we have ***.  Past Medical History:  Diagnosis Date   Bell's palsy 03/2017   Gallstones    Gestational diabetes mellitus (GDM) 2005; 2006   Headache    "monthly" (03/12/2017)   History of chest pain    History of prolonged Q-T interval on ECG    a. 09/2012 adm: QTC 506.   Hypertension    PAF (paroxysmal atrial fibrillation) ([place]) 2012   a. 07/2011 ED visit with AF-> converted with flecainide, never  followed-up;  b. 05/2012 Recurrent Afib -echo: EF 65-60%, mildly dil LA. Placed on Xarelto/daily Flecainide. c. 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   Small bowel obstruction ([place]) 09/2010   resolved   Type II diabetes mellitus ([place])    a. 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 b. 05/2012 A1c 12.6.   Review of Systems:   ROS   Physical Exam: There were no vitals filed for this visit.   General: *** HEENT: Conjunctiva nl , antiicteric sclerae, moist mucous membranes, no exudate or erythema Cardiovascular: Normal rate, regular rhythm.  No murmurs, rubs, or gallops Pulmonary : Equal breath sounds, No wheezes, rales, or rhonchi Abdominal: soft, nontender,  bowel sounds present Ext: No edema in lower extremities, no tenderness to palpation of lower extremities.   Assessment & Plan:   See Encounters Tab for problem based charting.  Patient {GC/GE:3044014::"discussed with","seen with"} Dr. {ZCHYI:[id number]::"O. Hoffman","Guilloud","Mullen","Narendra","Raines","Vincent","Williams"}

## 2022-02-16 ENCOUNTER — Other Ambulatory Visit (HOSPITAL_COMMUNITY): Payer: Self-pay

## 2022-04-03 ENCOUNTER — Other Ambulatory Visit (HOSPITAL_COMMUNITY): Payer: Self-pay

## 2022-05-05 ENCOUNTER — Other Ambulatory Visit (HOSPITAL_COMMUNITY): Payer: Self-pay

## 2022-05-05 ENCOUNTER — Other Ambulatory Visit: Payer: Self-pay | Admitting: Student

## 2022-05-12 ENCOUNTER — Other Ambulatory Visit (HOSPITAL_COMMUNITY): Payer: Self-pay

## 2022-05-20 ENCOUNTER — Other Ambulatory Visit: Payer: Self-pay

## 2022-05-20 ENCOUNTER — Ambulatory Visit: Payer: BC Managed Care – PPO | Admitting: Dietician

## 2022-05-20 ENCOUNTER — Ambulatory Visit: Payer: BC Managed Care – PPO | Admitting: Student

## 2022-05-20 ENCOUNTER — Encounter: Payer: Self-pay | Admitting: Student

## 2022-05-20 ENCOUNTER — Other Ambulatory Visit (HOSPITAL_COMMUNITY): Payer: Self-pay

## 2022-05-20 VITALS — BP 187/98 | HR 89 | Temp 97.9°F | Ht 66.0 in | Wt 177.1 lb

## 2022-05-20 DIAGNOSIS — E119 Type 2 diabetes mellitus without complications: Secondary | ICD-10-CM | POA: Diagnosis not present

## 2022-05-20 DIAGNOSIS — I10 Essential (primary) hypertension: Secondary | ICD-10-CM | POA: Diagnosis not present

## 2022-05-20 DIAGNOSIS — I480 Paroxysmal atrial fibrillation: Secondary | ICD-10-CM | POA: Diagnosis not present

## 2022-05-20 LAB — POCT GLYCOSYLATED HEMOGLOBIN (HGB A1C): Hemoglobin A1C: 13.1 % — AB (ref 4.0–5.6)

## 2022-05-20 LAB — GLUCOSE, CAPILLARY: Glucose-Capillary: 499 mg/dL — ABNORMAL HIGH (ref 70–99)

## 2022-05-20 MED ORDER — METOPROLOL SUCCINATE ER 25 MG PO TB24
75.0000 mg | ORAL_TABLET | Freq: Every day | ORAL | 3 refills | Status: DC
  Filled 2022-05-20: qty 90, 30d supply, fill #0
  Filled 2022-09-18: qty 90, 30d supply, fill #1
  Filled 2023-03-25: qty 90, 30d supply, fill #2

## 2022-05-20 MED ORDER — PEN NEEDLES 32G X 4 MM MISC
1.0000 | Freq: Every day | 12 refills | Status: DC
Start: 2022-05-20 — End: 2023-02-23
  Filled 2022-05-20: qty 100, 25d supply, fill #0
  Filled 2022-05-26 – 2022-06-03 (×2): qty 100, 90d supply, fill #0

## 2022-05-20 MED ORDER — RIVAROXABAN 20 MG PO TABS
20.0000 mg | ORAL_TABLET | Freq: Every day | ORAL | 3 refills | Status: DC
  Filled 2022-05-20: qty 90, 90d supply, fill #0
  Filled 2023-03-25: qty 30, 30d supply, fill #0

## 2022-05-20 NOTE — Progress Notes (Unsigned)
   Established Patient Office Visit  Subjective   Patient ID: [name], female    DOB: 27-Jan-1970  Age: 52 y.o. MRN: [id number]  Chief Complaint  Patient presents with   Follow-up    3 month f/u on T2DM    HPI  {History (Optional):23778}  ROS    Objective:     Ht 5\' 6"  (1.676 m)   Wt 177 lb 1.6 oz (80.3 kg)   LMP 10/01/2006   BMI 28.58 kg/m  {Vitals History (Optional):23777}  Physical Exam   Results for orders placed or performed in visit on 05/20/22  Glucose, capillary  Result Value Ref Range   Glucose-Capillary 499 (H) 70 - 99 mg/dL   Comment 1 Notify RN    Comment 2 Document in Chart     {Labs (Optional):23779}  The ASCVD Risk score (Arnett DK, et al., 2019) failed to calculate for the following reasons:   The valid total cholesterol range is 130 to 320 mg/dL    Assessment & Plan:   Problem List Items Addressed This Visit     Diabetes mellitus (HCC) - Primary   Relevant Orders   POC Hbg A1C    No follow-ups on file.    Quincy Simmonds, MD

## 2022-05-20 NOTE — Progress Notes (Signed)
Documentation for [facility] Pro Continuous glucose monitoring per Dr. Wilder Glade Libre Pro CGM sensor placed today. Patient was educated about wearing sensor, keeping food, activity and medication log and when to call office. Patient was educated about how to care for the sensor and not to have an MRI, CT or Diathermy while wearing the sensor. Follow up was arranged with the patient for 1 week.   Lot #: [id number] Serial #: [id number] Expiration Date: 09/05/2022  Norm Parcel, RD 05/20/2022 4:43 PM.

## 2022-05-20 NOTE — Patient Instructions (Addendum)
Your blood pressure is elevated and your blood sugar is also high.  Is very important that you take your medicines to prevent high blood pressure and high blood sugar.  Uncontrolled diabetes and hypertension can lead to heart attacks, strokes, nerve disease and nerve pain, vision loss, serious infection, and renal failure.  For hypertension start taking the following. Metoprolol 75 mg daily Verapamil 120 mg daily Losartan 100 mg daily  For diabetes take the following Xultophy 20 units daily Empagliflozin-metformin 1 tablet 2 times a day  Continue taking Xarelto 20 mg once a day  I have called the pharmacy and you can pick up all of these medications  Please set up and eye appointment.  Their contact information is listed below [facility] [place] in [place] Address: [address] [place], [place], [place] [address]   Phone: [phone]  Follow-up in 2 weeks

## 2022-05-20 NOTE — Patient Instructions (Signed)
Please record the time, amount and what food drinks and activities you have while wearing the continuous glucose monitor (CGM).  Bring the folder with you to follow up appointments. If your monitor falls off, please place it in the bag provided in your folder and bring it back with you to your next appointment.   Do not have a CT or an MRI while wearing the CGM.   Keep checking your blood sugar.  You will return in 2 weeks to have your second download and the CGM removed.  Norm Parcel, RD 05/20/2022 4:24 PM

## 2022-05-21 ENCOUNTER — Other Ambulatory Visit: Payer: Self-pay

## 2022-05-21 ENCOUNTER — Other Ambulatory Visit (HOSPITAL_COMMUNITY): Payer: Self-pay

## 2022-05-21 NOTE — Assessment & Plan Note (Signed)
Reports se is taking xarelto however does not appear she has filled this since December. No LE edema or shortness of breath rate is controlled on metoprolol which she is taking.  Encouraged her to fill her xarelto.

## 2022-05-21 NOTE — Assessment & Plan Note (Signed)
>>  ASSESSMENT AND PLAN FOR ESSENTIAL HYPERTENSION WRITTEN ON 05/21/2022 11:50 AM BY LIANG, JESSICA, MD  BP elevated today to 187/98.Has not filled most her of medications since December and she is unsure what she is taking.  She is only taking 4 tablets daily which I believe is 3 tablets of metoprolol  and 1 tablet of Xarelto  at nighttime.  Previously prescribed verapamil  120 mg daily, losartan  100 mg daily, and hydrochlorothiazide  25 mg daily.  However none of these have been filled recently.  Will have her restart verapamil  and losartan .  Will hold off on hydrochlorothiazide  given her history of hypertrophic cardiomyopathy.  When asked about her medications patient states that only metoprolol  was available when she was at the pharmacy. Denies financial difficulties with her medications or significant adverse effects when taking medications.   Restart verapamil  120 mg daily, losartan  100 mg daily Metoprolol  75 mg daily Referral to pharmacy medication management given difficulties with adherence

## 2022-05-21 NOTE — Assessment & Plan Note (Addendum)
A1c uncontrolled at 13.1.  CBG 499.  Patient does report increased thirst mild neuropathic symptoms.  Has been out of xultophy for about 1 month. States pharmacy told her she needed to have OV before filling her synjardy. Also having difficulty with her meter.  Overall her A1c has not been well-controlled in years.  Has elevated microalbumin creatinine ratio at last visit.  I think much of this is related to poor adherence to medications.  Patient also has poor understanding of diet modifications and frequently drinks sweet tea.  Has not follow-up with diabetic educator since last visit.  I discussed with our diabetic educator Lupita Leash today.  We were able to place professional CGM on her at visit today to better monitor her glucose.  Will restart her medications today.  I called the pharmacy and they have her medications available.   Start is Xultophy 20 units daily Start Synjardy 05-998 mg twice daily  Referral to pharmacy medication management Follow-up for CGM with Lupita Leash in 2 weeks Contact information to set up appointment with ophthalmology Follow-up in office in 2 weeks

## 2022-05-21 NOTE — Assessment & Plan Note (Addendum)
BP elevated today to 187/98.Has not filled most her of medications since December and she is unsure what she is taking.  She is only taking 4 tablets daily which I believe is 3 tablets of metoprolol and 1 tablet of Xarelto at nighttime.  Previously prescribed verapamil 120 mg daily, losartan 100 mg daily, and hydrochlorothiazide 25 mg daily.  However none of these have been filled recently.  Will have her restart verapamil and losartan.  Will hold off on hydrochlorothiazide given her history of hypertrophic cardiomyopathy.  When asked about her medications patient states that only metoprolol was available when she was at the pharmacy. Denies financial difficulties with her medications or significant adverse effects when taking medications.   Restart verapamil 120 mg daily, losartan 100 mg daily Metoprolol 75 mg daily Referral to pharmacy medication management given difficulties with adherence

## 2022-05-21 NOTE — Progress Notes (Signed)
Internal Medicine Clinic Attending  Case discussed with Dr. Liang  at the time of the visit.  We reviewed the resident's history and exam and pertinent patient test results.  I agree with the assessment, diagnosis, and plan of care documented in the resident's note.  

## 2022-05-25 ENCOUNTER — Other Ambulatory Visit: Payer: BC Managed Care – PPO | Admitting: Pharmacist

## 2022-05-26 ENCOUNTER — Other Ambulatory Visit (HOSPITAL_COMMUNITY): Payer: Self-pay

## 2022-05-26 ENCOUNTER — Other Ambulatory Visit: Payer: BC Managed Care – PPO

## 2022-05-26 NOTE — Progress Notes (Incomplete)
05/26/2022 Name: [name] MRN: [id number] DOB: Dec 19, 1970  Chief Complaint  Patient presents with   Medication Adherence   [name] is a 52 y.o. year old female who presented for a telephone visit.   They were referred to the pharmacist by their PCP for assistance in managing  medication adherence .   Patient is participating in a Managed Medicaid Plan:  No  Subjective: Telephone visit to discuss medication management and adherence related to chronic disease states, specifically hypertension and diabetes. Care Team: Primary Care Provider: Marrianne Mood, MD ; Next Scheduled Visit: 06/03/22 w/ Dr. Elaina Pattee  Medication Access/Adherence Current Pharmacy:  [facility] [address] [place] [address] Phone: [phone] Fax: [phone]  -Patient reports affordability concerns with their medications: {YES/NO:21197} -Patient reports access/transportation concerns to their pharmacy: {YES/NO:21197} -Patient reports adherence concerns with their medications:  {YES/NO:21197} ***  Diabetes: Current medications:  Medications tried in the past:   Current glucose readings: *** Using *** meter; testing *** times daily  @CGM @  Patient {Actions; denies-reports:120008} hypoglycemic s/sx including ***dizziness, shakiness, sweating. Patient {Actions; denies-reports:120008} hyperglycemic symptoms including ***polyuria, polydipsia, polyphagia, nocturia, neuropathy, blurred vision.  Current meal patterns:  - Breakfast: *** - Lunch *** - Supper *** - Snacks *** - Drinks ***  Current physical activity: ***  Current medication access support: ***  Hypertension: Current medications: *** Medications previously tried:   Patient {HAS/DOES NOT ONGE:95284} a validated, automated, upper arm home BP cuff Current blood pressure readings readings: ***  Patient {Actions; denies-reports:120008} hypotensive s/sx including ***dizziness,  lightheadedness.  Patient {Actions; denies-reports:120008} hypertensive symptoms including ***headache, chest pain, shortness of breath  Current meal patterns: ***  Current physical activity: ***  Hyperlipidemia/ASCVD Risk Reduction Current lipid lowering medications:  Medications tried in the past:   Antiplatelet regimen:   ASCVD History:  Family History:  Risk Factors:   Current physical activity: ***  Current medication access support: ***  Objective: Lab Results  Component Value Date   HGBA1C 13.1 (A) 05/20/2022   Lab Results  Component Value Date   CREATININE 0.87 12/18/2021   BUN 14 12/18/2021   NA 139 12/18/2021   K 4.0 12/18/2021   CL 97 12/18/2021   CO2 23 12/18/2021   Lab Results  Component Value Date   CHOL 127 12/02/2019   HDL 55 12/02/2019   LDLCALC 39 12/02/2019   TRIG 164 (H) 12/02/2019   CHOLHDL 2.3 12/02/2019   Medications Reviewed Today     Reviewed by Quincy Simmonds, MD (Resident) on 05/21/22 at 1153  Med List Status: <None>   Medication Order Taking? Sig Documenting Provider Last Dose Status Informant  Accu-Chek Softclix Lancets lancets [id number]  Use as instructed Quincy Simmonds, MD  Active   acetaminophen (TYLENOL) 500 MG tablet [id number] No Take 500 mg by mouth every 6 (six) hours as needed for moderate pain or headache. [provider] 04/18/2021 Active Self  blood glucose meter kit and supplies KIT [id number] No Dispense based on patient and insurance preference. Use up to four times daily as directed. (FOR ICD-9 250.00, 250.01). Lanelle Bal, MD Taking Active Self  Empagliflozin-metFORMIN HCl (SYNJARDY) 05-998 MG TABS [id number]  Take 1 tablet by mouth 2 (two) times daily. Adron Bene, MD  Active   glucose blood (ACCU-CHEK GUIDE) test strip [id number]  Use as directed to check blood sugar once daily. Quincy Simmonds, MD  Active     Discontinued 05/20/22 1622   Insulin Degludec-Liraglutide (XULTOPHY) 100-3.6 UNIT-MG/ML  SOPN [id number]  Please Inject 20 units into the skin daily this week, then increase to 24 units into the skin daily next week and stay on that dose. Quincy Simmonds, MD  Active   Insulin Pen Needle (PEN NEEDLES) 32G X 4 MM MISC [id number]  use as directed Quincy Simmonds, MD  Active   Insulin Pen Needle 32G X 4 MM MISC [id number]  USE AS DIRECTED ONCE DAILY Quincy Simmonds, MD  Active   losartan (COZAAR) 100 MG tablet [id number]  Take 1 tablet (100 mg total) by mouth daily. Adron Bene, MD  Active   metoprolol succinate (TOPROL-XL) 25 MG 24 hr tablet [id number]  Take 3 tablets (75 mg total) by mouth daily. Quincy Simmonds, MD  Active   rivaroxaban (XARELTO) 20 MG TABS tablet [id number]  Take 1 tablet (20 mg total) by mouth daily with supper. Quincy Simmonds, MD  Active   verapamil (CALAN-SR) 120 MG CR tablet [id number]  Take 1 tablet (120 mg total) by mouth daily. Quincy Simmonds, MD  Active   Med List Note Salvatore Marvel, CPhT 05/12/19 2057): Patient requested that [facility] be left as her preferred pharmacy- regardless of it's hours of operation           Assessment/Plan:   Diabetes: - Currently uncontrolled - Reviewed long term cardiovascular and renal outcomes of uncontrolled blood sugar - Reviewed goal A1c, goal fasting, and goal 2 hour post prandial glucose - Reviewed dietary modifications including *** - Reviewed lifestyle modifications including: - Recommend to ***  - Recommend to check glucose *** - Meets financial criteria for *** patient assistance program through ***. Will collaborate with provider, CPhT, and patient to pursue assistance.   Hypertension: - Currently uncontrolled - Reviewed long term cardiovascular and renal outcomes of uncontrolled blood pressure - Reviewed appropriate blood pressure monitoring technique and reviewed goal blood pressure. Recommended to check home blood pressure and heart rate *** - Recommend to ***   Hyperlipidemia/ASCVD Risk Reduction: -  Currently {CHL Controlled/Uncontrolled:[phone]}.  - Reviewed long term complications of uncontrolled cholesterol - Reviewed dietary recommendations including *** - Reviewed lifestyle recommendations including *** - Recommend to ***  - Meets financial criteria for *** patient assistance program through ***. Will collaborate with provider, CPhT, and patient to pursue assistance.   Follow Up Plan: ***  Lenna Gilford, PharmD, DPLA

## 2022-06-02 ENCOUNTER — Other Ambulatory Visit (HOSPITAL_COMMUNITY): Payer: Self-pay

## 2022-06-03 ENCOUNTER — Other Ambulatory Visit (HOSPITAL_COMMUNITY): Payer: Self-pay

## 2022-06-03 ENCOUNTER — Ambulatory Visit (INDEPENDENT_AMBULATORY_CARE_PROVIDER_SITE_OTHER): Payer: BC Managed Care – PPO | Admitting: Student

## 2022-06-03 ENCOUNTER — Other Ambulatory Visit (HOSPITAL_COMMUNITY)
Admission: RE | Admit: 2022-06-03 | Discharge: 2022-06-03 | Disposition: A | Discharge status: Home / Self Care | Payer: BC Managed Care – PPO | Source: Ambulatory Visit | Attending: Internal Medicine | Admitting: Internal Medicine

## 2022-06-03 ENCOUNTER — Ambulatory Visit: Payer: BC Managed Care – PPO | Admitting: Dietician

## 2022-06-03 VITALS — BP 158/84 | HR 73 | Temp 98.1°F | Wt 179.0 lb

## 2022-06-03 VITALS — Wt 179.6 lb

## 2022-06-03 DIAGNOSIS — I10 Essential (primary) hypertension: Secondary | ICD-10-CM | POA: Diagnosis not present

## 2022-06-03 DIAGNOSIS — E119 Type 2 diabetes mellitus without complications: Secondary | ICD-10-CM

## 2022-06-03 DIAGNOSIS — Z713 Dietary counseling and surveillance: Secondary | ICD-10-CM

## 2022-06-03 DIAGNOSIS — Z124 Encounter for screening for malignant neoplasm of cervix: Secondary | ICD-10-CM

## 2022-06-03 DIAGNOSIS — E1169 Type 2 diabetes mellitus with other specified complication: Secondary | ICD-10-CM | POA: Diagnosis not present

## 2022-06-03 DIAGNOSIS — Z7984 Long term (current) use of oral hypoglycemic drugs: Secondary | ICD-10-CM

## 2022-06-03 DIAGNOSIS — Z794 Long term (current) use of insulin: Secondary | ICD-10-CM | POA: Diagnosis not present

## 2022-06-03 MED ORDER — CLONIDINE 0.1 MG/24HR TD PTWK
0.1000 mg | MEDICATED_PATCH | TRANSDERMAL | 11 refills | Status: DC
  Filled 2022-06-03: qty 4, 28d supply, fill #0

## 2022-06-03 MED ORDER — SYNJARDY 12.5-1000 MG PO TABS
1.0000 | ORAL_TABLET | Freq: Two times a day (BID) | ORAL | 11 refills | Status: DC
  Filled 2022-06-03: qty 60, 30d supply, fill #0

## 2022-06-03 MED ORDER — INSULIN DEGLUDEC-LIRAGLUTIDE 100-3.6 UNIT-MG/ML ~~LOC~~ SOPN
25.0000 [IU] | PEN_INJECTOR | Freq: Every day | SUBCUTANEOUS | 4 refills | Status: DC
Start: 2022-06-03 — End: 2023-02-23
  Filled 2022-06-03: qty 15, fill #0

## 2022-06-03 MED ORDER — ACCU-CHEK GUIDE W/DEVICE KIT
1.0000 [IU] | PACK | Freq: Once | 1 refills | Status: DC | PRN
  Filled 2022-06-03: qty 1, 25d supply, fill #0
  Filled 2022-06-03: qty 1, 1d supply, fill #0

## 2022-06-03 NOTE — Assessment & Plan Note (Addendum)
[name] wore the CGM for 15 days. The average reading was 306, % time in target was 2, % time below target was 0, and % time above target was. 98. Intervention will be to increase Xultophy to 25 units daily and increase Synjardy to 12.05-998 mg twice daily.   Patient met with Lupita Leash today they discussed patient reports with eating less sugary cereals in the morning, reducing rice portions during lunch and dinner, and replacing sodas and fruit juices with tea, fruit, and water.  Patient would like to resume using her glucose meter to monitor blood sugars and would like to hold off on personal CGM for now.  Provided voucher for new glucometer as well as sent refills for supplies.  Follow-up diabetes in 1 month.

## 2022-06-03 NOTE — Progress Notes (Signed)
Established Patient Office Visit  Subjective   Patient ID: [name], female    DOB: Jun 24, 1970  Age: 52 y.o. MRN: [id number]  Chief Complaint  Patient presents with   Diabetes   Hypertension    [name] is a 51 y.o. person living with a history listed below who presents to clinic for follow up of diabetes and hypertension. Please refer to problem based charting for further details and assessment and plan of current problem and chronic medical conditions.     Patient Active Problem List   Diagnosis Date Noted   Encounter for Pap smear of cervix with HPV DNA cotesting 06/05/2022   Shoulder pain 05/31/2020   Screening for colon cancer 05/02/2020   Encounter for hepatitis C screening test for low risk patient 05/22/2019   History of COVID-19 11/2018 11/28/2018   HOCM (hypertrophic obstructive cardiomyopathy) (HCC) 04/03/2017   Bell's palsy 03/11/2017   Healthcare maintenance 01/17/2017   Chest pain 12/13/2013   Atrial fibrillation (HCC) 05/23/2012   Essential hypertension 05/23/2012   Type 2 diabetes mellitus with other specified complication (HCC) 05/23/2012   ROS: negative as per HPI    Objective:     BP (!) 158/84 (BP Location: Left Arm, Patient Position: Sitting, Cuff Size: Normal)   Pulse 73   Temp 98.1 F (36.7 C) (Oral)   Wt 179 lb (81.2 kg)   LMP 10/01/2006   SpO2 100%   BMI 28.89 kg/m  BP Readings from Last 3 Encounters:  06/03/22 (!) 158/84  05/20/22 (!) 187/98  02/06/22 (!) 161/91      Physical Exam Constitutional:      Appearance: Normal appearance.  HENT:     Head: Normocephalic and atraumatic.     Mouth/Throat:     Mouth: Mucous membranes are moist.     Pharynx: Oropharynx is clear.  Eyes:     Extraocular Movements: Extraocular movements intact.     Conjunctiva/sclera: Conjunctivae normal.     Pupils: Pupils are equal, round, and reactive to light.  Cardiovascular:     Rate and Rhythm: Normal rate and regular rhythm.      Heart sounds: Murmur (holosysytolic murmur) heard.  Pulmonary:     Effort: Pulmonary effort is normal.     Breath sounds: No rhonchi or rales.  Abdominal:     General: Abdomen is flat. Bowel sounds are normal. There is no distension.     Palpations: Abdomen is soft.     Tenderness: There is no abdominal tenderness.  Musculoskeletal:        General: Normal range of motion.     Right lower leg: No edema.     Left lower leg: No edema.  Skin:    General: Skin is warm and dry.     Capillary Refill: Capillary refill takes less than 2 seconds.  Neurological:     General: No focal deficit present.     Mental Status: She is alert and oriented to person, place, and time.  Psychiatric:        Mood and Affect: Mood normal.        Behavior: Behavior normal.      Results for orders placed or performed in visit on 06/03/22  BMP8+Anion Gap  Result Value Ref Range   Glucose 146 (H) 70 - 99 mg/dL   BUN 13 6 - 24 mg/dL   Creatinine, Ser 4.09 0.57 - 1.00 mg/dL   eGFR 89 >81 XB/JYN/8.29   BUN/Creatinine Ratio 16 9 -  23   Sodium 137 134 - 144 mmol/L   Potassium 3.9 3.5 - 5.2 mmol/L   Chloride 98 96 - 106 mmol/L   CO2 22 20 - 29 mmol/L   Anion Gap 17.0 10.0 - 18.0 mmol/L   Calcium 9.4 8.7 - 10.2 mg/dL    Last metabolic panel Lab Results  Component Value Date   GLUCOSE 146 (H) 06/03/2022   NA 137 06/03/2022   K 3.9 06/03/2022   CL 98 06/03/2022   CO2 22 06/03/2022   BUN 13 06/03/2022   CREATININE 0.80 06/03/2022   EGFR 89 06/03/2022   CALCIUM 9.4 06/03/2022   PHOS 4.3 06/14/2018   PROT 7.4 12/02/2019   ALBUMIN 3.5 12/02/2019   BILITOT 0.5 12/02/2019   ALKPHOS 88 12/02/2019   AST 28 12/02/2019   ALT 31 12/02/2019   ANIONGAP 11 12/11/2021      The ASCVD Risk score (Arnett DK, et al., 2019) failed to calculate for the following reasons:   The valid total cholesterol range is 130 to 320 mg/dL    Assessment & Plan:   Problem List Items Addressed This Visit     Encounter for  Pap smear of cervix with HPV DNA cotesting    Remote history of Pap smear about 10 years ago.  She does not recall the results.  She is postmenopausal without abnormal bleeding or other symptoms.  Pap smear performed today.  Noted to have small scattered hyperpigmented macules spread evenly throughout the vaginal mucosa.  Cervix mildly friable but otherwise normal-appearing.  No masses on bimanual exam.  Unclear of the significance of vaginal mucosal findings. I       Essential hypertension - Primary    BP today is 160/90 and 158/84 on repeat.  BP mildly reduced since restarting her medications.  She is compliant with losartan 100 mg daily, verapamil 120 mg daily, metoprolol 75 mg daily.  She is otherwise asymptomatic from this.  Given history of hypertrophic cardiomyopathy would like to avoid preload reduction.  Will start on clonidine 0.1 mg patches every week for further blood pressure control.  BMP checked today.      Relevant Medications   cloNIDine (CATAPRES - DOSED IN MG/24 HR) 0.1 mg/24hr patch   Other Relevant Orders   BMP8+Anion Gap (Completed)   Type 2 diabetes mellitus with other specified complication (HCC)    Jamelah T Heitmeyer wore the CGM for 15 days. The average reading was 306, % time in target was 2, % time below target was 0, and % time above target was. 98. Intervention will be to increase Xultophy to 25 units daily and increase Synjardy to 12.05-998 mg twice daily.   Patient met with Lupita Leash today they discussed patient reports with eating less sugary cereals in the morning, reducing rice portions during lunch and dinner, and replacing sodas and fruit juices with tea, fruit, and water.  Patient would like to resume using her glucose meter to monitor blood sugars and would like to hold off on personal CGM for now.  Provided voucher for new glucometer as well as sent refills for supplies.  Follow-up diabetes in 1 month.      Relevant Medications   Insulin Degludec-Liraglutide  (XULTOPHY) 100-3.6 UNIT-MG/ML SOPN   Empagliflozin-metFORMIN HCl (SYNJARDY) 12.05-998 MG TABS   Other Visit Diagnoses     Cervical cancer screening       Relevant Orders   Cytology -Pap Smear   Type 2 diabetes mellitus without complication, without long-term current  use of insulin (HCC)       Relevant Medications   Insulin Degludec-Liraglutide (XULTOPHY) 100-3.6 UNIT-MG/ML SOPN   Empagliflozin-metFORMIN HCl (SYNJARDY) 12.05-998 MG TABS       Return in about 4 weeks (around 07/01/2022).    Quincy Simmonds, MD

## 2022-06-03 NOTE — Progress Notes (Signed)
Medical Nutrition Therapy:  Appt start time: 1315 end time:  1400. Total time: 45 minutes Visit # 1   Assessment:  Primary concerns today: glycemia and professional Continuous glucose monitor downloaded and report provided to patient and Dr. Elaina Pattee. Her CGM sensor was removed today.  Patient states "now that I know" I plan to decrease my portion of rice and eat more vegetables and try to move more after dinner. We also discussed how her blood sugar goes up after breakfast and dinner but less so after lunch likely due to more activity.  Preferred Learning Style: No preference indicated  Learning Readiness: Contemplating  ANTHROPOMETRICS: Estimated body mass index is 28.99 kg/m as calculated from the following:   Height as of 05/20/22: 5\' 6"  (1.676 m).   Weight as of this encounter: 179 lb 9.6 oz (81.5 kg).  WEIGHT HISTORY: Wt Readings from Last 10 Encounters:  06/03/22 179 lb (81.2 kg)  06/03/22 179 lb 9.6 oz (81.5 kg)  05/20/22 177 lb 1.6 oz (80.3 kg)  02/02/22 174 lb 1.6 oz (79 kg)  12/18/21 177 lb 11.2 oz (80.6 kg)  12/11/21 176 lb 6.4 oz (80 kg)  07/07/21 176 lb 5.9 oz (80 kg)  05/31/20 178 lb (80.7 kg)  05/01/20 181 lb 8 oz (82.3 kg)  03/28/20 177 lb 12.8 oz (80.6 kg)    SLEEP: she reports no problems, sleeps well  MEDICATIONS:  Current Outpatient Medications on File Prior to Visit  Medication Sig Dispense Refill   Accu-Chek Softclix Lancets lancets Use as instructed 100 each 12   acetaminophen (TYLENOL) 500 MG tablet Take 500 mg by mouth every 6 (six) hours as needed for moderate pain or headache.     Empagliflozin-metFORMIN HCl (SYNJARDY) 05-998 MG TABS Take 1 tablet by mouth 2 (two) times daily. 60 tablet 2   glucose blood (ACCU-CHEK GUIDE) test strip Use as directed to check blood sugar once daily. 100 each 3   Insulin Degludec-Liraglutide (XULTOPHY) 100-3.6 UNIT-MG/ML SOPN Please Inject 20 units into the skin daily this week, then increase to 24 units into the skin  daily next week and stay on that dose. 15 mL 4   Insulin Pen Needle (PEN NEEDLES) 32G X 4 MM MISC use as directed 100 each 12   losartan (COZAAR) 100 MG tablet Take 1 tablet (100 mg total) by mouth daily. 30 tablet 0   metoprolol succinate (TOPROL-XL) 25 MG 24 hr tablet Take 3 tablets (75 mg total) by mouth daily. 90 tablet 3   rivaroxaban (XARELTO) 20 MG TABS tablet Take 1 tablet (20 mg total) by mouth daily with supper. 90 tablet 3   verapamil (CALAN-SR) 120 MG CR tablet Take 1 tablet (120 mg total) by mouth daily. 30 tablet 11   No current facility-administered medications on file prior to visit.   DIETARY INTAKE: Usual eating pattern includes 3 meals and 1 snacks per day. 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   Nutritional Diagnosis:  NB-1.1 Food and nutrition-related knowledge deficit As related to lack of sufficient prior diabetes self management training and education.  As evidenced by her questions and report.    Intervention:  Nutrition education about carb portions, label reading . Action Goal: decrease rice and regular soda, increase beans and vegetables  Outcome goal:  Coordination of care:   Teaching Method Utilized: Visual, Auditory,Hands on Handouts given during visit include: After visit summary Barriers to learning/adherence  to lifestyle change: competing values Demonstrated degree of understanding via:  Teach Back   Monitoring/Evaluation:  Dietary intake, exercise, meter, and body weight in 4 week(s) Norm Parcel, RD 06/03/2022 3:54 PM. .

## 2022-06-03 NOTE — Patient Instructions (Addendum)
Is a pleasure seeing you in clinic today  Diabetes Increase Synjardy to 12.05-998 mg twice a day Increase Xultophy to 25 units daily  I have ordered a new meter for you and Lupita Leash will provide you with a voucher  I will call you with your Pap smear results.  Hypertension Please continue your verapamil, metoprolol, and losartan. Please add on clonidine 0.1 mg patches once a week Is very important you do not miss or forget to change her clonidine patch once a week as this will make your blood pressure higher  Will check labs today  Please follow-up in 1 month

## 2022-06-03 NOTE — Patient Instructions (Signed)
Thank you for your visit today!  To help lower your blood sugar, you talked about: 1-  Eating more veggies and beans and okra and eating smaller portions of rice 2- Drinking more water, unsweetened tea (decaf at night) instead of Coke and eating a small piece of fruit instead of drinking juice. 3- taking walks after dinner 4- to lower sugar at breakfast- Eating berries instead of raisins, using greek yogurt instead of milk  Lupita Leash [phone]

## 2022-06-04 ENCOUNTER — Other Ambulatory Visit (HOSPITAL_COMMUNITY): Payer: Self-pay

## 2022-06-04 ENCOUNTER — Other Ambulatory Visit: Payer: Self-pay

## 2022-06-04 LAB — BMP8+ANION GAP
Anion Gap: 17 mmol/L (ref 10.0–18.0)
BUN/Creatinine Ratio: 16 (ref 9–23)
BUN: 13 mg/dL (ref 6–24)
CO2: 22 mmol/L (ref 20–29)
Calcium: 9.4 mg/dL (ref 8.7–10.2)
Chloride: 98 mmol/L (ref 96–106)
Creatinine, Ser: 0.8 mg/dL (ref 0.57–1.00)
Glucose: 146 mg/dL — ABNORMAL HIGH (ref 70–99)
Potassium: 3.9 mmol/L (ref 3.5–5.2)
Sodium: 137 mmol/L (ref 134–144)
eGFR: 89 mL/min/{1.73_m2} (ref >59)

## 2022-06-05 ENCOUNTER — Other Ambulatory Visit (HOSPITAL_COMMUNITY): Payer: Self-pay

## 2022-06-05 ENCOUNTER — Encounter: Payer: Self-pay | Admitting: Student

## 2022-06-05 DIAGNOSIS — Z124 Encounter for screening for malignant neoplasm of cervix: Secondary | ICD-10-CM | POA: Insufficient documentation

## 2022-06-05 NOTE — Assessment & Plan Note (Signed)
BP today is 160/90 and 158/84 on repeat.  BP mildly reduced since restarting her medications.  She is compliant with losartan 100 mg daily, verapamil 120 mg daily, metoprolol 75 mg daily.  She is otherwise asymptomatic from this.  Given history of hypertrophic cardiomyopathy would like to avoid preload reduction.  Will start on clonidine 0.1 mg patches every week for further blood pressure control.  BMP checked today.

## 2022-06-05 NOTE — Assessment & Plan Note (Signed)
Remote history of Pap smear about 10 years ago.  She does not recall the results.  She is postmenopausal without abnormal bleeding or other symptoms.  Pap smear performed today.  Noted to have small scattered hyperpigmented macules spread evenly throughout the vaginal mucosa.  Cervix mildly friable but otherwise normal-appearing.  No masses on bimanual exam.  Unclear of the significance of vaginal mucosal findings. I discussed with GYN on call Dr. Donavan Foil who recommended evaluation by gynecology. Will make referral to gynecology.

## 2022-06-05 NOTE — Assessment & Plan Note (Signed)
>>  ASSESSMENT AND PLAN FOR ESSENTIAL HYPERTENSION WRITTEN ON 06/05/2022  8:49 AM BY LIANG, JESSICA, MD  BP today is 160/90 and 158/84 on repeat.  BP mildly reduced since restarting her medications.  She is compliant with losartan  100 mg daily, verapamil  120 mg daily, metoprolol  75 mg daily.  She is otherwise asymptomatic from this.  Given history of hypertrophic cardiomyopathy would like to avoid preload reduction.  Will start on clonidine  0.1 mg patches every week for further blood pressure control.  BMP checked today.

## 2022-06-08 NOTE — Progress Notes (Signed)
Internal Medicine Clinic Attending ? ?Case discussed with Dr. Liang  At the time of the visit.  We reviewed the resident?s history and exam and pertinent patient test results.  I agree with the assessment, diagnosis, and plan of care documented in the resident?s note. ? ?

## 2022-06-09 LAB — CYTOLOGY - PAP
Adequacy: ABSENT
Comment: NEGATIVE
Diagnosis: NEGATIVE
High risk HPV: NEGATIVE

## 2022-06-10 ENCOUNTER — Encounter: Payer: Self-pay | Admitting: *Deleted

## 2022-06-16 ENCOUNTER — Other Ambulatory Visit (HOSPITAL_COMMUNITY): Payer: Self-pay

## 2022-06-23 ENCOUNTER — Other Ambulatory Visit: Payer: Self-pay | Admitting: Student

## 2022-06-23 DIAGNOSIS — Z124 Encounter for screening for malignant neoplasm of cervix: Secondary | ICD-10-CM

## 2022-07-02 ENCOUNTER — Encounter: Payer: BC Managed Care – PPO | Admitting: Internal Medicine

## 2022-09-18 ENCOUNTER — Emergency Department (HOSPITAL_COMMUNITY): Payer: BC Managed Care – PPO

## 2022-09-18 ENCOUNTER — Emergency Department (HOSPITAL_COMMUNITY)
Admission: EM | Admit: 2022-09-18 | Discharge: 2022-09-19 | Disposition: A | Discharge status: Home / Self Care | Payer: BC Managed Care – PPO | Attending: Emergency Medicine | Admitting: Emergency Medicine

## 2022-09-18 ENCOUNTER — Other Ambulatory Visit: Payer: Self-pay

## 2022-09-18 ENCOUNTER — Other Ambulatory Visit: Payer: Self-pay | Admitting: Internal Medicine

## 2022-09-18 ENCOUNTER — Other Ambulatory Visit (HOSPITAL_COMMUNITY): Payer: Self-pay

## 2022-09-18 ENCOUNTER — Encounter (HOSPITAL_COMMUNITY): Payer: Self-pay | Admitting: *Deleted

## 2022-09-18 DIAGNOSIS — Z79899 Other long term (current) drug therapy: Secondary | ICD-10-CM | POA: Diagnosis not present

## 2022-09-18 DIAGNOSIS — Z7901 Long term (current) use of anticoagulants: Secondary | ICD-10-CM | POA: Diagnosis not present

## 2022-09-18 DIAGNOSIS — I4891 Unspecified atrial fibrillation: Secondary | ICD-10-CM | POA: Insufficient documentation

## 2022-09-18 DIAGNOSIS — I10 Essential (primary) hypertension: Secondary | ICD-10-CM | POA: Diagnosis not present

## 2022-09-18 DIAGNOSIS — Z760 Encounter for issue of repeat prescription: Secondary | ICD-10-CM | POA: Diagnosis not present

## 2022-09-18 DIAGNOSIS — E119 Type 2 diabetes mellitus without complications: Secondary | ICD-10-CM | POA: Insufficient documentation

## 2022-09-18 DIAGNOSIS — I160 Hypertensive urgency: Secondary | ICD-10-CM | POA: Insufficient documentation

## 2022-09-18 DIAGNOSIS — Z794 Long term (current) use of insulin: Secondary | ICD-10-CM | POA: Insufficient documentation

## 2022-09-18 DIAGNOSIS — Z7984 Long term (current) use of oral hypoglycemic drugs: Secondary | ICD-10-CM | POA: Diagnosis not present

## 2022-09-18 DIAGNOSIS — R519 Headache, unspecified: Secondary | ICD-10-CM | POA: Diagnosis present

## 2022-09-18 LAB — BASIC METABOLIC PANEL
Anion gap: 17 — ABNORMAL HIGH (ref 5–15)
BUN: 16 mg/dL (ref 6–20)
CO2: 26 mmol/L (ref 22–32)
Calcium: 9.5 mg/dL (ref 8.9–10.3)
Chloride: 93 mmol/L — ABNORMAL LOW (ref 98–111)
Creatinine, Ser: 0.9 mg/dL (ref 0.44–1.00)
GFR, Estimated: >60 mL/min (ref >60)
Glucose, Bld: 242 mg/dL — ABNORMAL HIGH (ref 70–99)
Potassium: 3.4 mmol/L — ABNORMAL LOW (ref 3.5–5.1)
Sodium: 136 mmol/L (ref 135–145)

## 2022-09-18 LAB — CBC
HCT: 42.5 % (ref 36.0–46.0)
Hemoglobin: 13.6 g/dL (ref 12.0–15.0)
MCH: 25.6 pg — ABNORMAL LOW (ref 26.0–34.0)
MCHC: 32 g/dL (ref 30.0–36.0)
MCV: 79.9 fL — ABNORMAL LOW (ref 80.0–100.0)
Platelets: 271 10*3/uL (ref 150–400)
RBC: 5.32 MIL/uL — ABNORMAL HIGH (ref 3.87–5.11)
RDW: 13.3 % (ref 11.5–15.5)
WBC: 9.4 10*3/uL (ref 4.0–10.5)
nRBC: 0 % (ref 0.0–0.2)

## 2022-09-18 LAB — TROPONIN I (HIGH SENSITIVITY): Troponin I (High Sensitivity): 18 ng/L — ABNORMAL HIGH (ref <18)

## 2022-09-18 MED ORDER — ACETAMINOPHEN 325 MG PO TABS
650.0000 mg | ORAL_TABLET | Freq: Once | ORAL | Status: AC
  Administered 2022-09-19: 650 mg via ORAL
  Filled 2022-09-18: qty 2

## 2022-09-18 MED ORDER — LOSARTAN POTASSIUM 50 MG PO TABS
100.0000 mg | ORAL_TABLET | Freq: Once | ORAL | Status: AC
  Administered 2022-09-19: 100 mg via ORAL
  Filled 2022-09-18: qty 2

## 2022-09-18 MED ORDER — VERAPAMIL HCL ER 120 MG PO TBCR
120.0000 mg | EXTENDED_RELEASE_TABLET | Freq: Once | ORAL | Status: AC
  Administered 2022-09-19: 120 mg via ORAL
  Filled 2022-09-18: qty 1

## 2022-09-18 MED ORDER — METOPROLOL SUCCINATE ER 25 MG PO TB24
75.0000 mg | ORAL_TABLET | Freq: Every day | ORAL | Status: DC
  Administered 2022-09-19: 75 mg via ORAL
  Filled 2022-09-18: qty 3

## 2022-09-18 NOTE — ED Triage Notes (Signed)
The pt has had a headache in the back of her head since yesterday  she takes bp med but she has been out  for one day

## 2022-09-18 NOTE — ED Provider Notes (Incomplete)
[place] EMERGENCY DEPARTMENT AT [facility] Provider Note   CSN: [id number] Arrival date & time: 09/18/22  2103     History {Add pertinent medical, surgical, social history, OB history to HPI:1} Chief Complaint  Patient presents with   Headache   Hypertension    [name] is a 52 y.o. female with history of HOCM, A-fib with anticoagulation with Xarelto, hypertension, type 2 diabetes who presents with concern for left-sided headache without vision changes that started yesterday.  Of note patient has been without her antihypertensive medication x 48 hours as well.  Headache started after she missed a days worth of her medications.  No numbness tingling or weakness in the arms or legs that is new.  Does have baseline numbness and tingling in her feet in context of type 2 diabetes, following with PCP for this.  No changes in the last 48 hours.  Patient has intermittent mild chest pain context for HOCM but no chest pain or shortness of breath at this time.  Patient well-appearing.  States she did pick up her antihypertensive medication refills this evening at 5 PM but has not taken any medication.  No medications for her headache at home either.  HPI     Home Medications Prior to Admission medications   Medication Sig Start Date End Date Taking? Authorizing Provider  Accu-Chek Softclix Lancets lancets Use as instructed 12/18/21   Quincy Simmonds, MD  acetaminophen (TYLENOL) 500 MG tablet Take 500 mg by mouth every 6 (six) hours as needed for moderate pain or headache.    [provider]  Blood Glucose Monitoring Suppl (ACCU-CHEK GUIDE) w/Device KIT Use to check blood sugar once daily. 06/03/22   Quincy Simmonds, MD  cloNIDine (CATAPRES - DOSED IN MG/24 HR) 0.1 mg/24hr patch Place 1 patch (0.1 mg total) onto the skin every 7 (seven) days. 06/03/22 06/03/23  Quincy Simmonds, MD  Empagliflozin-metFORMIN HCl [facility]) 12.05-998 MG TABS Take 1 tablet by mouth 2 (two) times  daily. 06/03/22   Quincy Simmonds, MD  glucose blood (ACCU-CHEK GUIDE) test strip Use as directed to check blood sugar once daily. 12/18/21   Quincy Simmonds, MD  Insulin Degludec-Liraglutide (XULTOPHY) 100-3.6 UNIT-MG/ML SOPN Please Inject 20 units into the skin once daily this week, then increase to 24 units into the skin daily next week and stay on that dose. 06/03/22   Quincy Simmonds, MD  Insulin Pen Needle (PEN NEEDLES) 32G X 4 MM MISC use as directed 05/20/22   Quincy Simmonds, MD  losartan (COZAAR) 100 MG tablet Take 1 tablet (100 mg total) by mouth daily. 02/06/22   Adron Bene, MD  metoprolol succinate (TOPROL-XL) 25 MG 24 hr tablet Take 3 tablets (75 mg total) by mouth daily. 05/20/22   Quincy Simmonds, MD  rivaroxaban (XARELTO) 20 MG TABS tablet Take 1 tablet (20 mg total) by mouth daily with supper. 05/20/22   Quincy Simmonds, MD  verapamil (CALAN-SR) 120 MG CR tablet Take 1 tablet (120 mg total) by mouth daily. 12/18/21 12/18/22  Quincy Simmonds, MD      Allergies    Patient has no known allergies.    Review of Systems   Review of Systems  Constitutional: Negative.   HENT: Negative.    Eyes: Negative.   Respiratory: Negative.    Cardiovascular: Negative.   Gastrointestinal: Negative.   Neurological:  Positive for headaches. Negative for dizziness, tremors, seizures, syncope, facial asymmetry, speech difficulty, weakness, light-headedness and numbness.    Physical Exam Updated Vital  Signs BP (!) 205/93 (BP Location: Right Arm)   Pulse 79   Temp 98.9 F (37.2 C) (Oral)   Resp 16   Ht 5\' 6"  (1.676 m)   Wt 81.2 kg   LMP 10/01/2006   SpO2 99%   BMI 28.89 kg/m  Physical Exam Vitals and nursing note reviewed.  Constitutional:      Appearance: She is not ill-appearing or toxic-appearing.  HENT:     Head: Normocephalic and atraumatic.     Mouth/Throat:     Mouth: Mucous membranes are moist.     Pharynx: No oropharyngeal exudate or posterior oropharyngeal erythema.  Eyes:      General:        Right eye: No discharge.        Left eye: No discharge.     Conjunctiva/sclera: Conjunctivae normal.  Cardiovascular:     Rate and Rhythm: Normal rate and regular rhythm.     Pulses: Normal pulses.  Pulmonary:     Effort: Pulmonary effort is normal. No respiratory distress.     Breath sounds: Normal breath sounds. No wheezing or rales.  Abdominal:     General: Bowel sounds are normal. There is no distension.     Tenderness: There is no abdominal tenderness.  Musculoskeletal:        General: No deformity.     Cervical back: Neck supple.  Skin:    General: Skin is warm and dry.     Capillary Refill: Capillary refill takes less than 2 seconds.  Neurological:     Mental Status: She is alert and oriented to person, place, and time. Mental status is at baseline.     GCS: GCS eye subscore is 4. GCS verbal subscore is 5. GCS motor subscore is 6.     Cranial Nerves: Cranial nerves 2-12 are intact.     Sensory: Sensation is intact.     Motor: Motor function is intact.     Coordination: Coordination is intact.     Gait: Gait is intact.  Psychiatric:        Mood and Affect: Mood normal.     ED Results / Procedures / Treatments   Labs (all labs ordered are listed, but only abnormal results are displayed) Labs Reviewed  BASIC METABOLIC PANEL - Abnormal; Notable for the following components:      Result Value   Potassium 3.4 (*)    Chloride 93 (*)    Glucose, Bld 242 (*)    Anion gap 17 (*)    All other components within normal limits  CBC - Abnormal; Notable for the following components:   RBC 5.32 (*)    MCV 79.9 (*)    MCH 25.6 (*)    All other components within normal limits  TROPONIN I (HIGH SENSITIVITY) - Abnormal; Notable for the following components:   Troponin I (High Sensitivity) 18 (*)    All other components within normal limits    EKG EKG Interpretation Date/Time:  Friday September 18 2022 21:31:09 EDT Ventricular Rate:  91 PR  Interval:  196 QRS Duration:  122 QT Interval:  410 QTC Calculation: 504 R Axis:   110  Text Interpretation: Normal sinus rhythm Right bundle branch block T wave abnormality, consider inferior ischemia Abnormal ECG Confirmed by Tilden Fossa [phone]) on 09/18/2022 10:50:17 PM  Radiology No results found.  Procedures Procedures  {Document cardiac monitor, telemetry assessment procedure when appropriate:1}  Medications Ordered in ED Medications - No data to display  ED  Course/ Medical Decision Making/ A&P   {   Click here for ABCD2, HEART and other calculatorsREFRESH Note before signing :1}                              Medical Decision Making Patient with history of headache in context of missing doses of her antihypertensive medications.  Exquisitely hypertensive on intake, anticoagulated on Xarelto.  Hypertensive to 238/113 on intake.  Cardiopulmonary exam unremarkable, neuroexam without focal deficit.  Patient very well-appearing, pleasant.  Emergent considerations for headache include subarachnoid hemorrhage, meningitis, temporal arteritis, glaucoma, cerebral ischemia, carotid/vertebral dissection, intracranial tumor, Venous sinus thrombosis, carbon monoxide poisoning, acute or chronic subdural hemorrhage.  Other considerations include: Migraine, Cluster headache, Tension headache, Hypertension, Caffeine / alcohol / drug withdrawal, Pseudotumor cerebri, Arteriovenous malformation, Head injury, Neurocysticercosis, Post-lumbar puncture, Preeclampsia, Cervical arthritis, Refractive error causing strain, Dental abscess, Sinusitis, Otitis media, Temporomandibular joint syndrome, Depression, Somatoform disorder (eg, somatization) Trigeminal neuralgia, Glossopharyngeal neuralgia.   Amount and/or Complexity of Data Reviewed Labs:     Details: CBC without leukocytosis or anemia, BMP with mild hypokalemia of 3.4, normal creatinine.  Mildly elevated anion gap of 17.  Troponin of 18 at  patient's baseline in context of her HOCM.   Radiology: ordered.    Details:  Chest x-ray negative for acute cardiopulmonary disease, visualized this provider.  CT head negative for acute cranial abnormality, vaginosis provider.    ECG/medicine tests:     Details: EKG with normal sinus rhythm without ischemic changes.   Patient overall very well-appearing, clinical picture most consistent with hypertensive urgency without evidence of acute endorgan injury.  Will administer oral antihypertensive medication doses and allow patient to rest, will reevaluate.  {Document critical care time when appropriate:1} {Document review of labs and clinical decision tools ie heart score, Chads2Vasc2 etc:1}  {Document your independent review of radiology images, and any outside records:1} {Document your discussion with family members, caretakers, and with consultants:1} {Document social determinants of health affecting pt's care:1} {Document your decision making why or why not admission, treatments were needed:1} Final Clinical Impression(s) / ED Diagnoses Final diagnoses:  None    Rx / DC Orders ED Discharge Orders     None

## 2022-09-18 NOTE — ED Notes (Signed)
The pt reports that she  has had toothache for several days

## 2022-09-19 LAB — URINALYSIS, ROUTINE W REFLEX MICROSCOPIC
Bilirubin Urine: NEGATIVE
Glucose, UA: >=500 mg/dL — AB
Ketones, ur: NEGATIVE mg/dL
Nitrite: POSITIVE — AB
Protein, ur: NEGATIVE mg/dL
Specific Gravity, Urine: 1.015 (ref 1.005–1.030)
pH: 5.5 (ref 5.0–8.0)

## 2022-09-19 LAB — URINALYSIS, MICROSCOPIC (REFLEX): WBC, UA: >50 WBC/hpf (ref 0–5)

## 2022-09-19 LAB — TROPONIN I (HIGH SENSITIVITY): Troponin I (High Sensitivity): 19 ng/L — ABNORMAL HIGH (ref <18)

## 2022-09-19 NOTE — Discharge Instructions (Signed)
You were seen in the ER today for your headache.  Suspect this is related to your high blood pressure in the context of you running out of your medication.  Please continue to take her medications at home as prescribed.  Follow-up with your primary care doctor and return to the ER with any new severe symptoms.

## 2022-09-21 MED ORDER — LOSARTAN POTASSIUM 100 MG PO TABS
100.0000 mg | ORAL_TABLET | Freq: Every day | ORAL | 0 refills | Status: DC
  Filled 2022-09-21: qty 30, 30d supply, fill #0

## 2022-09-22 ENCOUNTER — Other Ambulatory Visit: Payer: Self-pay

## 2022-09-22 ENCOUNTER — Other Ambulatory Visit (HOSPITAL_COMMUNITY): Payer: Self-pay

## 2022-10-02 ENCOUNTER — Other Ambulatory Visit (HOSPITAL_COMMUNITY): Payer: Self-pay

## 2022-10-06 ENCOUNTER — Encounter: Payer: Self-pay | Admitting: Student

## 2022-11-18 ENCOUNTER — Other Ambulatory Visit: Payer: Self-pay

## 2022-11-18 ENCOUNTER — Encounter (HOSPITAL_COMMUNITY): Payer: Self-pay | Admitting: Obstetrics and Gynecology

## 2022-11-18 ENCOUNTER — Observation Stay (HOSPITAL_COMMUNITY)
Admission: EM | Admit: 2022-11-18 | Discharge: 2022-11-20 | Disposition: A | Discharge status: Home / Self Care | Payer: BC Managed Care – PPO | Attending: Internal Medicine | Admitting: Internal Medicine

## 2022-11-18 ENCOUNTER — Emergency Department (HOSPITAL_COMMUNITY): Payer: BC Managed Care – PPO

## 2022-11-18 DIAGNOSIS — I10 Essential (primary) hypertension: Secondary | ICD-10-CM | POA: Diagnosis present

## 2022-11-18 DIAGNOSIS — E2609 Other primary hyperaldosteronism: Secondary | ICD-10-CM | POA: Diagnosis present

## 2022-11-18 DIAGNOSIS — E1169 Type 2 diabetes mellitus with other specified complication: Secondary | ICD-10-CM | POA: Diagnosis present

## 2022-11-18 DIAGNOSIS — E1165 Type 2 diabetes mellitus with hyperglycemia: Secondary | ICD-10-CM | POA: Diagnosis not present

## 2022-11-18 DIAGNOSIS — R109 Unspecified abdominal pain: Secondary | ICD-10-CM | POA: Diagnosis present

## 2022-11-18 DIAGNOSIS — N3000 Acute cystitis without hematuria: Secondary | ICD-10-CM | POA: Insufficient documentation

## 2022-11-18 DIAGNOSIS — R197 Diarrhea, unspecified: Secondary | ICD-10-CM | POA: Diagnosis not present

## 2022-11-18 DIAGNOSIS — I421 Obstructive hypertrophic cardiomyopathy: Secondary | ICD-10-CM | POA: Diagnosis present

## 2022-11-18 DIAGNOSIS — K56609 Unspecified intestinal obstruction, unspecified as to partial versus complete obstruction: Principal | ICD-10-CM | POA: Diagnosis present

## 2022-11-18 DIAGNOSIS — N390 Urinary tract infection, site not specified: Secondary | ICD-10-CM

## 2022-11-18 DIAGNOSIS — I480 Paroxysmal atrial fibrillation: Secondary | ICD-10-CM | POA: Insufficient documentation

## 2022-11-18 DIAGNOSIS — I4891 Unspecified atrial fibrillation: Secondary | ICD-10-CM | POA: Diagnosis present

## 2022-11-18 LAB — COMPREHENSIVE METABOLIC PANEL
ALT: 22 U/L (ref 0–44)
AST: 23 U/L (ref 15–41)
Albumin: 3.8 g/dL (ref 3.5–5.0)
Alkaline Phosphatase: 118 U/L (ref 38–126)
Anion gap: 14 (ref 5–15)
BUN: 14 mg/dL (ref 6–20)
CO2: 24 mmol/L (ref 22–32)
Calcium: 9.6 mg/dL (ref 8.9–10.3)
Chloride: 98 mmol/L (ref 98–111)
Creatinine, Ser: 1.06 mg/dL — ABNORMAL HIGH (ref 0.44–1.00)
GFR, Estimated: >60 mL/min (ref >60)
Glucose, Bld: 398 mg/dL — ABNORMAL HIGH (ref 70–99)
Potassium: 3.6 mmol/L (ref 3.5–5.1)
Sodium: 136 mmol/L (ref 135–145)
Total Bilirubin: 0.7 mg/dL (ref <1.2)
Total Protein: 8.2 g/dL — ABNORMAL HIGH (ref 6.5–8.1)

## 2022-11-18 LAB — URINALYSIS, ROUTINE W REFLEX MICROSCOPIC
Bilirubin Urine: NEGATIVE
Glucose, UA: >=500 mg/dL — AB
Hgb urine dipstick: NEGATIVE
Ketones, ur: NEGATIVE mg/dL
Nitrite: POSITIVE — AB
Protein, ur: NEGATIVE mg/dL
Specific Gravity, Urine: 1.033 — ABNORMAL HIGH (ref 1.005–1.030)
WBC, UA: >50 WBC/hpf (ref 0–5)
pH: 5 (ref 5.0–8.0)

## 2022-11-18 LAB — HEMOGLOBIN A1C
Hgb A1c MFr Bld: 13.9 % — ABNORMAL HIGH (ref 4.8–5.6)
Mean Plasma Glucose: 352.23 mg/dL

## 2022-11-18 LAB — CBC
HCT: 45 % (ref 36.0–46.0)
Hemoglobin: 14.3 g/dL (ref 12.0–15.0)
MCH: 26.1 pg (ref 26.0–34.0)
MCHC: 31.8 g/dL (ref 30.0–36.0)
MCV: 82.1 fL (ref 80.0–100.0)
Platelets: 262 10*3/uL (ref 150–400)
RBC: 5.48 MIL/uL — ABNORMAL HIGH (ref 3.87–5.11)
RDW: 13.7 % (ref 11.5–15.5)
WBC: 11.7 10*3/uL — ABNORMAL HIGH (ref 4.0–10.5)
nRBC: 0 % (ref 0.0–0.2)

## 2022-11-18 LAB — C DIFFICILE QUICK SCREEN W PCR REFLEX
C Diff antigen: NEGATIVE
C Diff interpretation: NOT DETECTED
C Diff toxin: NEGATIVE

## 2022-11-18 LAB — LIPASE, BLOOD: Lipase: 30 U/L (ref 11–51)

## 2022-11-18 LAB — GLUCOSE, CAPILLARY
Glucose-Capillary: 142 mg/dL — ABNORMAL HIGH (ref 70–99)
Glucose-Capillary: 154 mg/dL — ABNORMAL HIGH (ref 70–99)

## 2022-11-18 MED ORDER — LOSARTAN POTASSIUM 50 MG PO TABS
100.0000 mg | ORAL_TABLET | Freq: Every day | ORAL | Status: DC
  Administered 2022-11-18 – 2022-11-20 (×3): 100 mg via ORAL
  Filled 2022-11-18 (×3): qty 2

## 2022-11-18 MED ORDER — ACETAMINOPHEN 325 MG PO TABS
650.0000 mg | ORAL_TABLET | ORAL | Status: DC | PRN
  Administered 2022-11-18: 650 mg via ORAL
  Filled 2022-11-18: qty 2

## 2022-11-18 MED ORDER — MORPHINE SULFATE (PF) 4 MG/ML IV SOLN
4.0000 mg | Freq: Once | INTRAVENOUS | Status: AC
  Administered 2022-11-18: 4 mg via INTRAVENOUS
  Filled 2022-11-18: qty 1

## 2022-11-18 MED ORDER — METOPROLOL SUCCINATE ER 25 MG PO TB24
75.0000 mg | ORAL_TABLET | Freq: Every day | ORAL | Status: DC
  Administered 2022-11-18 – 2022-11-20 (×3): 75 mg via ORAL
  Filled 2022-11-18 (×3): qty 3

## 2022-11-18 MED ORDER — ONDANSETRON 4 MG PO TBDP
4.0000 mg | ORAL_TABLET | Freq: Once | ORAL | Status: AC
  Administered 2022-11-18: 4 mg via ORAL
  Filled 2022-11-18: qty 1

## 2022-11-18 MED ORDER — INSULIN ASPART 100 UNIT/ML IJ SOLN: 0.0000 [IU] | Freq: Every day | INTRAMUSCULAR | Status: DC

## 2022-11-18 MED ORDER — IOHEXOL 350 MG/ML SOLN
75.0000 mL | Freq: Once | INTRAVENOUS | Status: AC | PRN
  Administered 2022-11-18: 75 mL via INTRAVENOUS

## 2022-11-18 MED ORDER — INSULIN GLARGINE-YFGN 100 UNIT/ML ~~LOC~~ SOLN
20.0000 [IU] | Freq: Every day | SUBCUTANEOUS | Status: DC
  Filled 2022-11-18 (×3): qty 0.2

## 2022-11-18 MED ORDER — SODIUM CHLORIDE 0.9 % IV SOLN
1.0000 g | INTRAVENOUS | Status: AC
  Administered 2022-11-19 – 2022-11-20 (×2): 1 g via INTRAVENOUS
  Filled 2022-11-18 (×2): qty 10

## 2022-11-18 MED ORDER — HYDRALAZINE HCL 20 MG/ML IJ SOLN: 10.0000 mg | INTRAMUSCULAR | Status: DC | PRN

## 2022-11-18 MED ORDER — INSULIN ASPART 100 UNIT/ML IJ SOLN
0.0000 [IU] | Freq: Three times a day (TID) | INTRAMUSCULAR | Status: DC
  Administered 2022-11-18: 2 [IU] via SUBCUTANEOUS
  Administered 2022-11-19: 5 [IU] via SUBCUTANEOUS
  Administered 2022-11-19: 8 [IU] via SUBCUTANEOUS
  Administered 2022-11-19: 5 [IU] via SUBCUTANEOUS
  Administered 2022-11-20: 8 [IU] via SUBCUTANEOUS

## 2022-11-18 MED ORDER — SODIUM CHLORIDE 0.9 % IV SOLN
1.0000 g | Freq: Once | INTRAVENOUS | Status: AC
  Administered 2022-11-18: 1 g via INTRAVENOUS
  Filled 2022-11-18: qty 10

## 2022-11-18 MED ORDER — LABETALOL HCL 5 MG/ML IV SOLN: 20.0000 mg | INTRAVENOUS | Status: DC | PRN

## 2022-11-18 MED ORDER — DIATRIZOATE MEGLUMINE & SODIUM 66-10 % PO SOLN
90.0000 mL | Freq: Once | ORAL | Status: AC
  Administered 2022-11-18: 90 mL via ORAL
  Filled 2022-11-18: qty 90

## 2022-11-18 MED ORDER — SODIUM CHLORIDE 0.9 % IV BOLUS
1000.0000 mL | Freq: Once | INTRAVENOUS | Status: AC
  Administered 2022-11-18: 1000 mL via INTRAVENOUS

## 2022-11-18 MED ORDER — LACTATED RINGERS IV SOLN: INTRAVENOUS | Status: AC

## 2022-11-18 NOTE — ED Triage Notes (Addendum)
Pt. Stated, I started having stomach pain all over since last night. While in triage started to vomit.

## 2022-11-18 NOTE — H&P (Addendum)
History and Physical    [name] QMV:[id number] DOB: Feb 01, 1970 DOA: 11/18/2022  PCP: Sarah Mood, MD  Patient coming from: home   Chief Complaint: abdominal pain  HPI: [name] is a 52 y.o. female with medical history significant for paroxysmal a-fib, htn, hocm, t2dm, SBO in 2020, presenting with abdominal pain.  Symptoms began last night. Initially intermittent, now constant. Generalized abd pain worse in the periumbilical region. Vomited once today. Has had two bouts of liquid stool today. No fever. Has had one month intermittent dysuria, none today, no hematuria.   Admitted for SBO thought to be 2/2 adhesions, resolved w/ conservative mgmt.    Review of Systems: As per HPI otherwise 10 point review of systems negative.    Past Medical History:  Diagnosis Date   Bell's palsy 03/2017   Gallstones    Gestational diabetes mellitus (GDM) 2005; 2006   Headache    "monthly" (03/12/2017)   History of chest pain    History of prolonged Q-T interval on ECG    a. 09/2012 adm: QTC 506.   Hypertension    PAF (paroxysmal atrial fibrillation) (HCC) 2012   a. 07/2011 ED visit with AF-> converted with flecainide, never followed-up;  b. 05/2012 Recurrent Afib -echo: EF 65-60%, mildly dil LA. Placed on Xarelto/daily Flecainide. c. 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   Small bowel obstruction (HCC) 09/2010   resolved   Type II diabetes mellitus (HCC)    a. 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 b. 05/2012 A1c 12.6.    Past Surgical History:  Procedure Laterality Date   CESAREAN SECTION  2005; 2006   CHOLECYSTECTOMY N/A 03/25/2017   Procedure: LAPAROSCOPIC CHOLECYSTECTOMY;  Surgeon: Kinsinger, De Blanch, MD;  Location: [facility];  Service: General;  Laterality: N/A;   HERNIA REPAIR     VENTRAL HERNIA REPAIR  02/05/2005   with mesh     reports that she has never smoked. She has never used smokeless tobacco. She reports  that she does not drink alcohol and does not use drugs.  No Known Allergies  Family History  Problem Relation Age of Onset   Other Father        killed in [place] civil war when he was in his 38's   Asthma Mother        alive in her 32's   Other Sister        A & W   Other Sister        A & W   Heart disease Neg Hx     Prior to Admission medications   Medication Sig Start Date End Date Taking? Authorizing Provider  acetaminophen (TYLENOL) 500 MG tablet Take 500 mg by mouth every 6 (six) hours as needed for moderate pain or headache.   Yes [provider]  Empagliflozin-metFORMIN HCl (SYNJARDY) 12.05-998 MG TABS Take 1 tablet by mouth 2 (two) times daily. 06/03/22  Yes Sarah Simmonds, MD  Insulin Degludec-Liraglutide (XULTOPHY) 100-3.6 UNIT-MG/ML SOPN Please Inject 20 units into the skin once daily this week, then increase to 24 units into the skin daily next week and stay on that dose. Patient taking differently: Inject 20 Units into the skin at bedtime. 06/03/22  Yes Sarah Simmonds, MD  losartan (COZAAR) 100 MG tablet Take 1 tablet (100 mg total) by mouth daily. 09/21/22  Yes Sarah Mood, MD  metoprolol succinate (TOPROL-XL) 25 MG 24 hr tablet Take 3 tablets (75 mg  total) by mouth daily. 05/20/22  Yes Sarah Simmonds, MD  rivaroxaban (XARELTO) 20 MG TABS tablet Take 1 tablet (20 mg total) by mouth daily with supper. 05/20/22  Yes Sarah Simmonds, MD  Accu-Chek Softclix Lancets lancets Use as instructed 12/18/21   Sarah Simmonds, MD  Blood Glucose Monitoring Suppl (ACCU-CHEK GUIDE) w/Device KIT Use to check blood sugar once daily. 06/03/22   Sarah Simmonds, MD  cloNIDine (CATAPRES - DOSED IN MG/24 HR) 0.1 mg/24hr patch Place 1 patch (0.1 mg total) onto the skin every 7 (seven) days. Patient not taking: Reported on 11/18/2022 06/03/22 06/03/23  Sarah Simmonds, MD  glucose blood (ACCU-CHEK GUIDE) test strip Use as directed to check blood sugar once daily. 12/18/21   Sarah Simmonds,  MD  Insulin Pen Needle (PEN NEEDLES) 32G X 4 MM MISC use as directed 05/20/22   Sarah Simmonds, MD  verapamil (CALAN-SR) 120 MG CR tablet Take 1 tablet (120 mg total) by mouth daily. Patient not taking: Reported on 11/18/2022 12/18/21 12/18/22  Sarah Simmonds, MD    Physical Exam: Vitals:   11/18/22 1200 11/18/22 1300 11/18/22 1345 11/18/22 1400  BP: (!) 161/86 (!) 158/85 (!) 161/84 (!) 166/83  Pulse: 80 78 79 80  Resp: 16 16 18    Temp:  98 F (36.7 C)    TempSrc:      SpO2: 100% 96% 97% 96%    Constitutional: No acute distress Head: Atraumatic Eyes: Conjunctiva clear ENM: Moist mucous membranes. Normal dentition.  Neck: Supple Respiratory: Clear to auscultation bilaterally, no wheezing/rales/rhonchi. Normal respiratory effort. No accessory muscle use. . Cardiovascular: Regular rate and rhythm. No murmurs/rubs/gallops. Abdomen: mild tenderness throughout no rebound, decreased bowel sounds, distended Musculoskeletal: No joint deformity upper and lower extremities. Normal ROM, no contractures. Normal muscle tone.  Skin: No rashes, lesions, or ulcers.  Extremities: No peripheral edema. Palpable peripheral pulses. Neurologic: Alert, moving all 4 extremities. Psychiatric: Normal insight and judgement.   Labs on Admission: I have personally reviewed following labs and imaging studies  CBC: Recent Labs  Lab 11/18/22 0803  WBC 11.7*  HGB 14.3  HCT 45.0  MCV 82.1  PLT 262   Basic Metabolic Panel: Recent Labs  Lab 11/18/22 0803  NA 136  K 3.6  CL 98  CO2 24  GLUCOSE 398*  BUN 14  CREATININE 1.06*  CALCIUM 9.6   GFR: CrCl cannot be calculated (Unknown ideal weight.). Liver Function Tests: Recent Labs  Lab 11/18/22 0803  AST 23  ALT 22  ALKPHOS 118  BILITOT 0.7  PROT 8.2*  ALBUMIN 3.8   Recent Labs  Lab 11/18/22 0803  LIPASE 30   No results for input(s): "AMMONIA" in the last 168 hours. Coagulation Profile: No results for input(s): "INR", "PROTIME" in  the last 168 hours. Cardiac Enzymes: No results for input(s): "CKTOTAL", "CKMB", "CKMBINDEX", "TROPONINI" in the last 168 hours. BNP (last 3 results) No results for input(s): "PROBNP" in the last 8760 hours. HbA1C: No results for input(s): "HGBA1C" in the last 72 hours. CBG: No results for input(s): "GLUCAP" in the last 168 hours. Lipid Profile: No results for input(s): "CHOL", "HDL", "LDLCALC", "TRIG", "CHOLHDL", "LDLDIRECT" in the last 72 hours. Thyroid Function Tests: No results for input(s): "TSH", "T4TOTAL", "FREET4", "T3FREE", "THYROIDAB" in the last 72 hours. Anemia Panel: No results for input(s): "VITAMINB12", "FOLATE", "FERRITIN", "TIBC", "IRON", "RETICCTPCT" in the last 72 hours. Urine analysis:    Component Value Date/Time   COLORURINE YELLOW 11/18/2022 1115   APPEARANCEUR HAZY (A) 11/18/2022 1115  LABSPEC 1.033 (H) 11/18/2022 1115   PHURINE 5.0 11/18/2022 1115   GLUCOSEU >=500 (A) 11/18/2022 1115   HGBUR NEGATIVE 11/18/2022 1115   BILIRUBINUR NEGATIVE 11/18/2022 1115   KETONESUR NEGATIVE 11/18/2022 1115   PROTEINUR NEGATIVE 11/18/2022 1115   UROBILINOGEN 0.2 08/30/2013 0152   NITRITE POSITIVE (A) 11/18/2022 1115   LEUKOCYTESUR MODERATE (A) 11/18/2022 1115    Radiological Exams on Admission: CT ABDOMEN PELVIS W CONTRAST  Result Date: 11/18/2022 CLINICAL DATA:  Bowel obstruction suspected. Abdominal pain. Vomiting. EXAM: CT ABDOMEN AND PELVIS WITH CONTRAST TECHNIQUE: Multidetector CT imaging of the abdomen and pelvis was performed using the standard protocol following bolus administration of intravenous contrast. RADIATION DOSE REDUCTION: This exam was performed according to the departmental dose-optimization program which includes automated exposure control, adjustment of the mA and/or kV according to patient size and/or use of iterative reconstruction technique. CONTRAST:  75mL OMNIPAQUE IOHEXOL 350 MG/ML SOLN COMPARISON:  CT scan abdomen and pelvis from 04/23/2019.  FINDINGS: Lower chest: The lung bases are clear. No pleural effusion. The heart is normal in size. No pericardial effusion. Hepatobiliary: The liver is normal in size. Non-cirrhotic configuration. No suspicious mass. These is mild diffuse hepatic steatosis. No intrahepatic or extrahepatic bile duct dilation. Gallbladder is surgically absent. Pancreas: Unremarkable. No pancreatic ductal dilatation or surrounding inflammatory changes. Spleen: Within normal limits. No focal lesion. Adrenals/Urinary Tract: Adrenal glands are unremarkable. No suspicious renal mass. There is a nearly completely exophytic approximately 1 cm sized cyst arising from the right kidney lower pole, posteriorly. No hydronephrosis. No renal or ureteric calculi. Urinary bladder is physiologically distended. There is mild circumferential wall thickening with mild perivesical fat stranding, concerning for cystitis. Correlate clinically and with urinalysis. No focal mass or bladder calculi. Stomach/Bowel: There is probable small sliding hiatal hernia. Unremarkable stomach and duodenum. Appendix is in the right lower quadrant and appear unremarkable. There is small-to-moderate amount of stool throughout the colon which is otherwise unremarkable. However, there are multiple dilated small bowel loops measuring up to 3.4-3.5 cm in diameter. There is an area of narrowing in the region of calcified previous hernia repair in the midline lower abdomen. Proximal to this point, there is a long segment of fecalized small bowel loop. There also several dilated bowel loops distal to this point of transition. Findings favor acute small-bowel obstruction secondary to adhesions related to prior hernia repair. There is also mild circumferential thickening of multiple dilated and nondilated small bowel loops which exhibit prominence of vasa recta and inter-bowel free fluid, suggesting superimposed enteritis. There is no pneumatosis or portal venous gas. There is no  walled-off abscess or loculated collection. Note is made of small amount of ascites, likely reactive. No evidence of pneumoperitoneum. Vascular/Lymphatic: No abdominal or pelvic lymphadenopathy, by size criteria. No aneurysmal dilation of the major abdominal arteries. Reproductive: The uterus is unremarkable. No large adnexal mass. Other: Infraumbilical midline surgical scar noted. There is heterogeneous calcification with extension into the peritoneal space, which may represent combination of calcified hernia mesh with fat necrosis. This is likely the culprit/source of adhesions with small bowel loops leading to small-bowel obstruction. Musculoskeletal: No suspicious osseous lesions. IMPRESSION: *Findings, as described in detail above suggest small bowel obstruction with point of transition in the midline lower abdomen. At this point, there are probable adhesions between the small bowel loops and dystrophic calcification from prior hernia repair. There also changes of coexistent enteritis. No abscess, collection, pneumatosis, pneumoperitoneum or portal venous gas. *Multiple other nonacute observations, as described above. Electronically  Signed   By: Jules Schick M.D.   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 Has had two bouts of liquid stool today, vomited earlier but currently no nausea/vomiting. No signs of complication on CT - npo, IVF - further mgmt per gen surg who are evaluating the patient  # Acute cystitis One month of intermittent symptoms, ua suggestive of infection, no signs pyelo or obstructing stone on CT, afebrile - continue ceftriaxone - add on urine culture  #  Diarrhea Likely 2/2 SBO but also signs enteritis on CT - check gi pathogen panel and c diff  # T2DM Here hyperglycemic - hold home meds - start SSI q4 while npo - semglee 20 at bedtime  # HTN Here hypertensive to the 160s systolic - continue home losart/metop assuming no ng tube placed - hydral prn  # Paroxysmal a fib Rate controlled currently - home metop as above - holding home xarelto  # HOCM Asymptomatic, euvolemic - monitor  DVT prophylaxis: SCDs Code Status: full  Family Communication: none @ bedside  Consults called: gen surg   Level of care: Med-Surg Status is: Inpatient Remains inpatient appropriate because: need for inpt monitoring    Sarah Bilis MD [facility] Pager [phone]  If 7PM-7AM, please contact night-coverage [email or web address] Password [email or web address]  11/18/2022, 2:49 PM

## 2022-11-18 NOTE — Consult Note (Signed)
[facility] Surgery Consult Note  [name] 1970-10-08  [id number].    Requesting MD: Gerhard Munch Chief Complaint/Reason for Consult: SBO  HPI:  [name] is a 52 y.o. female with h/o A fib on xarelto (last dose 11/12 in PM), HTN, DM who presented to the ED for evaluation of abdominal pain. She has had a prior ventral hernia repair with mesh in 2007, as well as cholecystectomy and c section x2 in the past. She has had 1 prior bowel obstruction in 2020 that was managed and resolved conservatively. States that she was doing well until last night when she developed generalized abdominal pain as well as bloating, nausea, and vomiting. She has had 2 loose stools today but not passed any flatus. Typically has about 4 loose stools daily at baseline. No further emesis in several hours and she denies any ongoing nausea.  CT scan today shows SBO with point of transition in the midline lower abdomen likely related to adhesions.   Nonsmoker Employment: housekeeping at [employer]   Family History  Problem Relation Age of Onset   Other Father        killed in [place] civil war when he was in his 32's   Asthma Mother        alive in her 26's   Other Sister        A & W   Other Sister        A & W   Heart disease Neg Hx     Past Medical History:  Diagnosis Date   Bell's palsy 03/2017   Gallstones    Gestational diabetes mellitus (GDM) 2005; 2006   Headache    "monthly" (03/12/2017)   History of chest pain    History of prolonged Q-T interval on ECG    a. 09/2012 adm: QTC 506.   Hypertension    PAF (paroxysmal atrial fibrillation) (HCC) 2012   a. 07/2011 ED visit with AF-> converted with flecainide, never followed-up;  b. 05/2012 Recurrent Afib -echo: EF 65-60%, mildly dil LA. Placed on Xarelto/daily Flecainide. c. 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   Small bowel obstruction (HCC) 09/2010   resolved   Type II diabetes mellitus (HCC)    a. Gestational Diabetes with both  children -> progressed to type 2 DM. b. 05/2012 A1c 12.6.    Past Surgical History:  Procedure Laterality Date   CESAREAN SECTION  2005; 2006   CHOLECYSTECTOMY N/A 03/25/2017   Procedure: LAPAROSCOPIC CHOLECYSTECTOMY;  Surgeon: Kinsinger, De Blanch, MD;  Location: [facility];  Service: General;  Laterality: N/A;   HERNIA REPAIR     VENTRAL HERNIA REPAIR  02/05/2005   with mesh    Social History:  reports that she has never smoked. She has never used smokeless tobacco. She reports that she does not drink alcohol and does not use drugs.  Allergies: No Known Allergies  (Not in a hospital admission)   Prior to Admission medications   Medication Sig Start Date End Date Taking? Authorizing Provider  acetaminophen (TYLENOL) 500 MG tablet Take 500 mg by mouth every 6 (six) hours as needed for moderate pain or headache.   Yes [provider]  Empagliflozin-metFORMIN HCl (SYNJARDY) 12.05-998 MG TABS Take 1 tablet by mouth 2 (two) times daily. 06/03/22  Yes Quincy Simmonds, MD  Insulin Degludec-Liraglutide (XULTOPHY) 100-3.6 UNIT-MG/ML SOPN Please Inject 20 units into the skin once daily this week, then increase to 24 units into the skin daily  next week and stay on that dose. Patient taking differently: Inject 20 Units into the skin at bedtime. 06/03/22  Yes Quincy Simmonds, MD  losartan (COZAAR) 100 MG tablet Take 1 tablet (100 mg total) by mouth daily. 09/21/22  Yes Marrianne Mood, MD  metoprolol succinate (TOPROL-XL) 25 MG 24 hr tablet Take 3 tablets (75 mg total) by mouth daily. 05/20/22  Yes Quincy Simmonds, MD  rivaroxaban (XARELTO) 20 MG TABS tablet Take 1 tablet (20 mg total) by mouth daily with supper. 05/20/22  Yes Quincy Simmonds, MD  Accu-Chek Softclix Lancets lancets Use as instructed 12/18/21   Quincy Simmonds, MD  Blood Glucose Monitoring Suppl (ACCU-CHEK GUIDE) w/Device KIT Use to check blood sugar once daily. 06/03/22   Quincy Simmonds, MD  cloNIDine (CATAPRES -  DOSED IN MG/24 HR) 0.1 mg/24hr patch Place 1 patch (0.1 mg total) onto the skin every 7 (seven) days. Patient not taking: Reported on 11/18/2022 06/03/22 06/03/23  Quincy Simmonds, MD  glucose blood (ACCU-CHEK GUIDE) test strip Use as directed to check blood sugar once daily. 12/18/21   Quincy Simmonds, MD  Insulin Pen Needle (PEN NEEDLES) 32G X 4 MM MISC use as directed 05/20/22   Quincy Simmonds, MD  verapamil (CALAN-SR) 120 MG CR tablet Take 1 tablet (120 mg total) by mouth daily. Patient not taking: Reported on 11/18/2022 12/18/21 12/18/22  Quincy Simmonds, MD    Blood pressure (!) 184/96, pulse 86, temperature 98 F (36.7 C), resp. rate 18, last menstrual period 10/01/2006, SpO2 100%. Physical Exam: General: pleasant, WD/WN female who is laying in bed in NAD HEENT: head is normocephalic, atraumatic.  Sclera are noninjected.  Pupils equal and round.  Ears and nose without any masses or lesions.  Mouth is pink and moist. Dentition fair Heart: regular, rate, and rhythm Lungs: CTAB, no wheezes, rhonchi, or rales noted.  Respiratory effort nonlabored on room air Abd: soft, mild distension, few BS heard, no masses, hernias, or organomegaly. Mild right sided TTP without rebound or guarding Skin: warm and dry with no masses, lesions, or rashes Psych: A&Ox4 with an appropriate affect Neuro: MAEs, no gross motor or sensory deficits BUE/BLE  Results for orders placed or performed during the hospital encounter of 11/18/22 (from the past 48 hour(s))  Lipase, blood     Status: None   Collection Time: 11/18/22  8:03 AM  Result Value Ref Range   Lipase 30 11 - 51 U/L    Comment: Performed at [facility], [address] [place], [place] [address]  Comprehensive metabolic panel     Status: Abnormal   Collection Time: 11/18/22  8:03 AM  Result Value Ref Range   Sodium 136 135 - 145 mmol/L   Potassium 3.6 3.5 - 5.1 mmol/L   Chloride 98 98 - 111 mmol/L   CO2 24 22 - 32 mmol/L   Glucose, Bld 398 (H)  70 - 99 mg/dL    Comment: Glucose reference range applies only to samples taken after fasting for at least 8 hours.   BUN 14 6 - 20 mg/dL   Creatinine, Ser 2.44 (H) 0.44 - 1.00 mg/dL   Calcium 9.6 8.9 - 01.0 mg/dL   Total Protein 8.2 (H) 6.5 - 8.1 g/dL   Albumin 3.8 3.5 - 5.0 g/dL   AST 23 15 - 41 U/L   ALT 22 0 - 44 U/L   Alkaline Phosphatase 118 38 - 126 U/L   Total Bilirubin 0.7 <1.2 mg/dL   GFR, Estimated >27 >25  mL/min    Comment: (NOTE) Calculated using the CKD-EPI Creatinine Equation (2021)    Anion gap 14 5 - 15    Comment: Performed at [facility], [address], [place], [place] [address]  CBC     Status: Abnormal   Collection Time: 11/18/22  8:03 AM  Result Value Ref Range   WBC 11.7 (H) 4.0 - 10.5 K/uL   RBC 5.48 (H) 3.87 - 5.11 MIL/uL   Hemoglobin 14.3 12.0 - 15.0 g/dL   HCT 19.1 47.8 - 29.5 %   MCV 82.1 80.0 - 100.0 fL   MCH 26.1 26.0 - 34.0 pg   MCHC 31.8 30.0 - 36.0 g/dL   RDW 62.1 30.8 - 65.7 %   Platelets 262 150 - 400 K/uL   nRBC 0.0 0.0 - 0.2 %    Comment: Performed at [facility], [address] [place], [place] [address]  Urinalysis, Routine w reflex microscopic -Urine, Clean Catch     Status: Abnormal   Collection Time: 11/18/22 11:15 AM  Result Value Ref Range   Color, Urine YELLOW YELLOW   APPearance HAZY (A) CLEAR   Specific Gravity, Urine 1.033 (H) 1.005 - 1.030   pH 5.0 5.0 - 8.0   Glucose, UA >=500 (A) NEGATIVE mg/dL   Hgb urine dipstick NEGATIVE NEGATIVE   Bilirubin Urine NEGATIVE NEGATIVE   Ketones, ur NEGATIVE NEGATIVE mg/dL   Protein, ur NEGATIVE NEGATIVE mg/dL   Nitrite POSITIVE (A) NEGATIVE   Leukocytes,Ua MODERATE (A) NEGATIVE   RBC / HPF 0-5 0 - 5 RBC/hpf   WBC, UA >50 0 - 5 WBC/hpf   Bacteria, UA MANY (A) NONE SEEN   Squamous Epithelial / HPF 0-5 0 - 5 /HPF   WBC Clumps PRESENT     Comment: Performed at [facility], [address] [place], [place] [address]   CT ABDOMEN PELVIS W CONTRAST  Result Date:  11/18/2022 CLINICAL DATA:  Bowel obstruction suspected. Abdominal pain. Vomiting. EXAM: CT ABDOMEN AND PELVIS WITH CONTRAST TECHNIQUE: Multidetector CT imaging of the abdomen and pelvis was performed using the standard protocol following bolus administration of intravenous contrast. RADIATION DOSE REDUCTION: This exam was performed according to the departmental dose-optimization program which includes automated exposure control, adjustment of the mA and/or kV according to patient size and/or use of iterative reconstruction technique. CONTRAST:  75mL OMNIPAQUE IOHEXOL 350 MG/ML SOLN COMPARISON:  CT scan abdomen and pelvis from 04/23/2019. FINDINGS: Lower chest: The lung bases are clear. No pleural effusion. The heart is normal in size. No pericardial effusion. Hepatobiliary: The liver is normal in size. Non-cirrhotic configuration. No suspicious mass. These is mild diffuse hepatic steatosis. No intrahepatic or extrahepatic bile duct dilation. Gallbladder is surgically absent. Pancreas: Unremarkable. No pancreatic ductal dilatation or surrounding inflammatory changes. Spleen: Within normal limits. No focal lesion. Adrenals/Urinary Tract: Adrenal glands are unremarkable. No suspicious renal mass. There is a nearly completely exophytic approximately 1 cm sized cyst arising from the right kidney lower pole, posteriorly. No hydronephrosis. No renal or ureteric calculi. Urinary bladder is physiologically distended. There is mild circumferential wall thickening with mild perivesical fat stranding, concerning for cystitis. Correlate clinically and with urinalysis. No focal mass or bladder calculi. Stomach/Bowel: There is probable small sliding hiatal hernia. Unremarkable stomach and duodenum. Appendix is in the right lower quadrant and appear unremarkable. There is small-to-moderate amount of stool throughout the colon which is otherwise unremarkable. However, there are multiple dilated small bowel loops measuring up  to  3.4-3.5 cm in diameter. There is an area of narrowing in the region of calcified previous hernia repair in the midline lower abdomen. Proximal to this point, there is a long segment of fecalized small bowel loop. There also several dilated bowel loops distal to this point of transition. Findings favor acute small-bowel obstruction secondary to adhesions related to prior hernia repair. There is also mild circumferential thickening of multiple dilated and nondilated small bowel loops which exhibit prominence of vasa recta and inter-bowel free fluid, suggesting superimposed enteritis. There is no pneumatosis or portal venous gas. There is no walled-off abscess or loculated collection. Note is made of small amount of ascites, likely reactive. No evidence of pneumoperitoneum. Vascular/Lymphatic: No abdominal or pelvic lymphadenopathy, by size criteria. No aneurysmal dilation of the major abdominal arteries. Reproductive: The uterus is unremarkable. No large adnexal mass. Other: Infraumbilical midline surgical scar noted. There is heterogeneous calcification with extension into the peritoneal space, which may represent combination of calcified hernia mesh with fat necrosis. This is likely the culprit/source of adhesions with small bowel loops leading to small-bowel obstruction. Musculoskeletal: No suspicious osseous lesions. IMPRESSION: *Findings, as described in detail above suggest small bowel obstruction with point of transition in the midline lower abdomen. At this point, there are probable adhesions between the small bowel loops and dystrophic calcification from prior hernia repair. There also changes of coexistent enteritis. No abscess, collection, pneumatosis, pneumoperitoneum or portal venous gas. *Multiple other nonacute observations, as described above. Electronically Signed   By: Jules Schick M.D.   On: 11/18/2022 14:14      Assessment/Plan SBO - CT scan today shows SBO with point of transition in the  midline lower abdomen likely related to adhesions; there is a long segment of fecalized small bowel indicating some chronicity to her issue; mesh appears to be contracted and balled up  - No indication for acute surgical intervention. Given she has no ongoing n/v and is having some bowel function therefore can hold off on NG tube for now. Will start with oral gastrograffin for SBO protocol. If she develops any worsening abdominal pain/bloating or n/v then would need to place NG. We will follow.  ID - none VTE - hold xarelto, ok for chemical dvt ppx or heparin gtt if needed FEN - NPO Foley - none  A fib on xarelto (last dose 11/12 in PM) HTN DM  I reviewed ED provider notes, last 24 h vitals and pain scores, last 24 h labs and trends, and last 24 h imaging results.   Franne Forts, PA-C [facility] Surgery 11/18/2022, 2:58 PM Please see Amion for pager number during day hours 7:00am-4:30pm

## 2022-11-18 NOTE — ED Notes (Signed)
ED TO INPATIENT HANDOFF REPORT  ED Nurse Name and Phone #: Delice Bison, RN  S Name/Age/Gender [name] 52 y.o. female Room/Bed: 026C/026C  Code Status   Code Status: Prior  Home/SNF/Other Home Patient oriented to: self, place, time, and situation Is this baseline? Yes   Triage Complete: Triage complete  Chief Complaint Small bowel obstruction (HCC) [K56.609]  Triage Note Pt. Stated, I started having stomach pain all over since last night. While in triage started to vomit.    Allergies No Known Allergies  Level of Care/Admitting Diagnosis ED Disposition     ED Disposition  Admit   Condition  --   Comment  [facility]ospital [facility] [[id number]]  Level of Care: Med-Surg [16]  May admit patient to [facility] or [facility] if equivalent level of care is available:: No  Covid Evaluation: Asymptomatic - no recent exposure (last 10 days) testing not required  Diagnosis: Small bowel obstruction [facility]) [[id number]]  Admitting Physician: Erenest Blank  Attending Physician: Erenest Blank  Certification:: I certify this patient will need inpatient services for at least 2 midnights  Expected Medical Readiness: 11/21/2022          B Medical/Surgery History Past Medical History:  Diagnosis Date   Bell's palsy 03/2017   Gallstones    Gestational diabetes mellitus (GDM) 2005; 2006   Headache    "monthly" (03/12/2017)   History of chest pain    History of prolonged Q-T interval on ECG    a. 09/2012 adm: QTC 506.   Hypertension    PAF (paroxysmal atrial fibrillation) (HCC) 2012   a. 07/2011 ED visit with AF-> converted with flecainide, never followed-up;  b. 05/2012 Recurrent Afib -echo: EF 65-60%, mildly dil LA. Placed on Xarelto/daily Flecainide. c. 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   Small bowel obstruction (HCC) 09/2010   resolved   Type II diabetes mellitus (HCC)    a. Gestational Diabetes with both children -> progressed  to type 2 DM. b. 05/2012 A1c 12.6.   Past Surgical History:  Procedure Laterality Date   CESAREAN SECTION  2005; 2006   CHOLECYSTECTOMY N/A 03/25/2017   Procedure: LAPAROSCOPIC CHOLECYSTECTOMY;  Surgeon: Kinsinger, De Blanch, MD;  Location: [facility];  Service: General;  Laterality: N/A;   HERNIA REPAIR     VENTRAL HERNIA REPAIR  02/05/2005   with mesh     A IV Location/Drains/Wounds Patient Lines/Drains/Airways Status     Active Line/Drains/Airways     Name Placement date Placement time Site Days   Peripheral IV 11/18/22 20 G 1" Right Antecubital 11/18/22  0928  Antecubital  less than 1            Intake/Output Last 24 hours No intake or output data in the 24 hours ending 11/18/22 1446  Labs/Imaging Results for orders placed or performed during the hospital encounter of 11/18/22 (from the past 48 hour(s))  Lipase, blood     Status: None   Collection Time: 11/18/22  8:03 AM  Result Value Ref Range   Lipase 30 11 - 51 U/L    Comment: Performed at [facility], [address] [place], [place] [address]  Comprehensive metabolic panel     Status: Abnormal   Collection Time: 11/18/22  8:03 AM  Result Value Ref Range   Sodium 136 135 - 145 mmol/L   Potassium 3.6 3.5 - 5.1 mmol/L   Chloride 98 98 - 111 mmol/L  CO2 24 22 - 32 mmol/L   Glucose, Bld 398 (H) 70 - 99 mg/dL    Comment: Glucose reference range applies only to samples taken after fasting for at least 8 hours.   BUN 14 6 - 20 mg/dL   Creatinine, Ser 0.86 (H) 0.44 - 1.00 mg/dL   Calcium 9.6 8.9 - 57.8 mg/dL   Total Protein 8.2 (H) 6.5 - 8.1 g/dL   Albumin 3.8 3.5 - 5.0 g/dL   AST 23 15 - 41 U/L   ALT 22 0 - 44 U/L   Alkaline Phosphatase 118 38 - 126 U/L   Total Bilirubin 0.7 <1.2 mg/dL   GFR, Estimated >46 >96 mL/min    Comment: (NOTE) Calculated using the CKD-EPI Creatinine Equation (2021)    Anion gap 14 5 - 15    Comment: Performed at [facility], [address]  [place], [place] [address]  CBC     Status: Abnormal   Collection Time: 11/18/22  8:03 AM  Result Value Ref Range   WBC 11.7 (H) 4.0 - 10.5 K/uL   RBC 5.48 (H) 3.87 - 5.11 MIL/uL   Hemoglobin 14.3 12.0 - 15.0 g/dL   HCT 41.3 24.4 - 01.0 %   MCV 82.1 80.0 - 100.0 fL   MCH 26.1 26.0 - 34.0 pg   MCHC 31.8 30.0 - 36.0 g/dL   RDW 27.2 53.6 - 64.4 %   Platelets 262 150 - 400 K/uL   nRBC 0.0 0.0 - 0.2 %    Comment: Performed at [facility], [address] [place], [place] [address]  Urinalysis, Routine w reflex microscopic -Urine, Clean Catch     Status: Abnormal   Collection Time: 11/18/22 11:15 AM  Result Value Ref Range   Color, Urine YELLOW YELLOW   APPearance HAZY (A) CLEAR   Specific Gravity, Urine 1.033 (H) 1.005 - 1.030   pH 5.0 5.0 - 8.0   Glucose, UA >=500 (A) NEGATIVE mg/dL   Hgb urine dipstick NEGATIVE NEGATIVE   Bilirubin Urine NEGATIVE NEGATIVE   Ketones, ur NEGATIVE NEGATIVE mg/dL   Protein, ur NEGATIVE NEGATIVE mg/dL   Nitrite POSITIVE (A) NEGATIVE   Leukocytes,Ua MODERATE (A) NEGATIVE   RBC / HPF 0-5 0 - 5 RBC/hpf   WBC, UA >50 0 - 5 WBC/hpf   Bacteria, UA MANY (A) NONE SEEN   Squamous Epithelial / HPF 0-5 0 - 5 /HPF   WBC Clumps PRESENT     Comment: Performed at [facility], [address], [place], [place] [address]   CT ABDOMEN PELVIS W CONTRAST  Result Date: 11/18/2022 CLINICAL DATA:  Bowel obstruction suspected. Abdominal pain. Vomiting. EXAM: CT ABDOMEN AND PELVIS WITH CONTRAST TECHNIQUE: Multidetector CT imaging of the abdomen and pelvis was performed using the standard protocol following bolus administration of intravenous contrast. RADIATION DOSE REDUCTION: This exam was performed according to the departmental dose-optimization program which includes automated exposure control, adjustment of the mA and/or kV according to patient size and/or use of iterative reconstruction technique. CONTRAST:  75mL OMNIPAQUE IOHEXOL 350 MG/ML SOLN COMPARISON:  CT  scan abdomen and pelvis from 04/23/2019. FINDINGS: Lower chest: The lung bases are clear. No pleural effusion. The heart is normal in size. No pericardial effusion. Hepatobiliary: The liver is normal in size. Non-cirrhotic configuration. No suspicious mass. These is mild diffuse hepatic steatosis. No intrahepatic or extrahepatic bile duct dilation. Gallbladder is surgically absent. Pancreas: Unremarkable. No pancreatic ductal dilatation or surrounding inflammatory changes.  Spleen: Within normal limits. No focal lesion. Adrenals/Urinary Tract: Adrenal glands are unremarkable. No suspicious renal mass. There is a nearly completely exophytic approximately 1 cm sized cyst arising from the right kidney lower pole, posteriorly. No hydronephrosis. No renal or ureteric calculi. Urinary bladder is physiologically distended. There is mild circumferential wall thickening with mild perivesical fat stranding, concerning for cystitis. Correlate clinically and with urinalysis. No focal mass or bladder calculi. Stomach/Bowel: There is probable small sliding hiatal hernia. Unremarkable stomach and duodenum. Appendix is in the right lower quadrant and appear unremarkable. There is small-to-moderate amount of stool throughout the colon which is otherwise unremarkable. However, there are multiple dilated small bowel loops measuring up to 3.4-3.5 cm in diameter. There is an area of narrowing in the region of calcified previous hernia repair in the midline lower abdomen. Proximal to this point, there is a long segment of fecalized small bowel loop. There also several dilated bowel loops distal to this point of transition. Findings favor acute small-bowel obstruction secondary to adhesions related to prior hernia repair. There is also mild circumferential thickening of multiple dilated and nondilated small bowel loops which exhibit prominence of vasa recta and inter-bowel free fluid, suggesting superimposed enteritis. There is no  pneumatosis or portal venous gas. There is no walled-off abscess or loculated collection. Note is made of small amount of ascites, likely reactive. No evidence of pneumoperitoneum. Vascular/Lymphatic: No abdominal or pelvic lymphadenopathy, by size criteria. No aneurysmal dilation of the major abdominal arteries. Reproductive: The uterus is unremarkable. No large adnexal mass. Other: Infraumbilical midline surgical scar noted. There is heterogeneous calcification with extension into the peritoneal space, which may represent combination of calcified hernia mesh with fat necrosis. This is likely the culprit/source of adhesions with small bowel loops leading to small-bowel obstruction. Musculoskeletal: No suspicious osseous lesions. IMPRESSION: *Findings, as described in detail above suggest small bowel obstruction with point of transition in the midline lower abdomen. At this point, there are probable adhesions between the small bowel loops and dystrophic calcification from prior hernia repair. There also changes of coexistent enteritis. No abscess, collection, pneumatosis, pneumoperitoneum or portal venous gas. *Multiple other nonacute observations, as described above. Electronically Signed   By: Jules Schick M.D.   On: 11/18/2022 14:14    Pending Labs Unresulted Labs (From admission, onward)    None       Vitals/Pain Today's Vitals   11/18/22 1200 11/18/22 1300 11/18/22 1345 11/18/22 1400  BP: (!) 161/86 (!) 158/85 (!) 161/84 (!) 166/83  Pulse: 80 78 79 80  Resp: 16 16 18    Temp:  98 F (36.7 C)    TempSrc:      SpO2: 100% 96% 97% 96%  PainSc:        Isolation Precautions No active isolations  Medications Medications  ondansetron (ZOFRAN-ODT) disintegrating tablet 4 mg (4 mg Oral Given 11/18/22 0807)  sodium chloride 0.9 % bolus 1,000 mL (0 mLs Intravenous Stopped 11/18/22 1102)  morphine (PF) 4 MG/ML injection 4 mg (4 mg Intravenous Given 11/18/22 0929)  iohexol (OMNIPAQUE) 350  MG/ML injection 75 mL (75 mLs Intravenous Contrast Given 11/18/22 1059)  cefTRIAXone (ROCEPHIN) 1 g in sodium chloride 0.9 % 100 mL IVPB (1 g Intravenous New Bag/Given 11/18/22 1413)    Mobility walks     Focused Assessments Cardiac Assessment Handoff:    Lab Results  Component Value Date   CKTOTAL 91 10/20/2010   CKMB 1.9 10/20/2010   TROPONINI <0.03 04/04/2018   No results found  for: "DDIMER" Does the Patient currently have chest pain? No    R Recommendations: See Admitting Provider Note  Report given to:   Additional Notes:

## 2022-11-18 NOTE — ED Provider Notes (Signed)
[place] [facility]MENT AT [facility] Provider Note   CSN: [id number] Arrival date & time: 11/18/22  5784     History  Chief Complaint  Patient presents with   Abdominal Pain   Emesis   Nausea    [name] is a 52 y.o. female.  HPI With a history of multiple abdominal surgery who presents with abdominal pain, nausea, vomiting, anorexia.  Onset was within the past 24 hours.  Since onset she has been intolerant of p.o., has had diffuse abdominal pain, with vomiting.  No diarrhea, no fever, no chest pain, no dyspnea.  Surgeries include hernia repair, gallstones.    Home Medications Prior to Admission medications   Medication Sig Start Date End Date Taking? Authorizing Provider  Empagliflozin-metFORMIN HCl (SYNJARDY) 12.05-998 MG TABS Take 1 tablet by mouth 2 (two) times daily. 06/03/22  Yes Quincy Simmonds, MD  Insulin Degludec-Liraglutide (XULTOPHY) 100-3.6 UNIT-MG/ML SOPN Please Inject 20 units into the skin once daily this week, then increase to 24 units into the skin daily next week and stay on that dose. 06/03/22  Yes Quincy Simmonds, MD  losartan (COZAAR) 100 MG tablet Take 1 tablet (100 mg total) by mouth daily. 09/21/22  Yes Marrianne Mood, MD  metoprolol succinate (TOPROL-XL) 25 MG 24 hr tablet Take 3 tablets (75 mg total) by mouth daily. 05/20/22  Yes Quincy Simmonds, MD  rivaroxaban (XARELTO) 20 MG TABS tablet Take 1 tablet (20 mg total) by mouth daily with supper. 05/20/22  Yes Quincy Simmonds, MD  Accu-Chek Softclix Lancets lancets Use as instructed 12/18/21   Quincy Simmonds, MD  acetaminophen (TYLENOL) 500 MG tablet Take 500 mg by mouth every 6 (six) hours as needed for moderate pain or headache.    [provider]  Blood Glucose Monitoring Suppl (ACCU-CHEK GUIDE) w/Device KIT Use to check blood sugar once daily. Patient not taking: Reported on 11/18/2022 06/03/22   Quincy Simmonds, MD  cloNIDine (CATAPRES - DOSED IN MG/24 HR) 0.1 mg/24hr patch  Place 1 patch (0.1 mg total) onto the skin every 7 (seven) days. 06/03/22 06/03/23  Quincy Simmonds, MD  glucose blood (ACCU-CHEK GUIDE) test strip Use as directed to check blood sugar once daily. 12/18/21   Quincy Simmonds, MD  Insulin Pen Needle (PEN NEEDLES) 32G X 4 MM MISC use as directed 05/20/22   Quincy Simmonds, MD  verapamil (CALAN-SR) 120 MG CR tablet Take 1 tablet (120 mg total) by mouth daily. Patient not taking: Reported on 11/18/2022 12/18/21 12/18/22  Quincy Simmonds, MD      Allergies    Patient has no known allergies.    Review of Systems   Review of Systems  Physical Exam Updated Vital Signs BP (!) 166/83   Pulse 80   Temp 98 F (36.7 C)   Resp 18   LMP 10/01/2006   SpO2 96%  Physical Exam Vitals and nursing note reviewed.  Constitutional:      General: She is not in acute distress.    Appearance: She is well-developed.  HENT:     Head: Normocephalic and atraumatic.  Eyes:     Conjunctiva/sclera: Conjunctivae normal.  Cardiovascular:     Rate and Rhythm: Normal rate and regular rhythm.  Pulmonary:     Effort: Pulmonary effort is normal. No respiratory distress.     Breath sounds: Normal breath sounds. No stridor.  Abdominal:     General: There is no distension.     Tenderness: There is generalized abdominal tenderness. There is  guarding.  Skin:    General: Skin is warm and dry.  Neurological:     Mental Status: She is alert and oriented to person, place, and time.     Cranial Nerves: No cranial nerve deficit.  Psychiatric:        Mood and Affect: Mood normal.     ED Results / Procedures / Treatments   Labs (all labs ordered are listed, but only abnormal results are displayed) Labs Reviewed  COMPREHENSIVE METABOLIC PANEL - Abnormal; Notable for the following components:      Result Value   Glucose, Bld 398 (*)    Creatinine, Ser 1.06 (*)    Total Protein 8.2 (*)    All other components within normal limits  CBC - Abnormal; Notable for the following  components:   WBC 11.7 (*)    RBC 5.48 (*)    All other components within normal limits  URINALYSIS, ROUTINE W REFLEX MICROSCOPIC - Abnormal; Notable for the following components:   APPearance HAZY (*)    Specific Gravity, Urine 1.033 (*)    Glucose, UA >=500 (*)    Nitrite POSITIVE (*)    Leukocytes,Ua MODERATE (*)    Bacteria, UA MANY (*)    All other components within normal limits  LIPASE, BLOOD    EKG None  Radiology CT ABDOMEN PELVIS W CONTRAST  Result Date: 11/18/2022 CLINICAL DATA:  Bowel obstruction suspected. Abdominal pain. Vomiting. EXAM: CT ABDOMEN AND PELVIS WITH CONTRAST TECHNIQUE: Multidetector CT imaging of the abdomen and pelvis was performed using the standard protocol following bolus administration of intravenous contrast. RADIATION DOSE REDUCTION: This exam was performed according to the departmental dose-optimization program which includes automated exposure control, adjustment of the mA and/or kV according to patient size and/or use of iterative reconstruction technique. CONTRAST:  75mL OMNIPAQUE IOHEXOL 350 MG/ML SOLN COMPARISON:  CT scan abdomen and pelvis from 04/23/2019. FINDINGS: Lower chest: The lung bases are clear. No pleural effusion. The heart is normal in size. No pericardial effusion. Hepatobiliary: The liver is normal in size. Non-cirrhotic configuration. No suspicious mass. These is mild diffuse hepatic steatosis. No intrahepatic or extrahepatic bile duct dilation. Gallbladder is surgically absent. Pancreas: Unremarkable. No pancreatic ductal dilatation or surrounding inflammatory changes. Spleen: Within normal limits. No focal lesion. Adrenals/Urinary Tract: Adrenal glands are unremarkable. No suspicious renal mass. There is a nearly completely exophytic approximately 1 cm sized cyst arising from the right kidney lower pole, posteriorly. No hydronephrosis. No renal or ureteric calculi. Urinary bladder is physiologically distended. There is mild  circumferential wall thickening with mild perivesical fat stranding, concerning for cystitis. Correlate clinically and with urinalysis. No focal mass or bladder calculi. Stomach/Bowel: There is probable small sliding hiatal hernia. Unremarkable stomach and duodenum. Appendix is in the right lower quadrant and appear unremarkable. There is small-to-moderate amount of stool throughout the colon which is otherwise unremarkable. However, there are multiple dilated small bowel loops measuring up to 3.4-3.5 cm in diameter. There is an area of narrowing in the region of calcified previous hernia repair in the midline lower abdomen. Proximal to this point, there is a long segment of fecalized small bowel loop. There also several dilated bowel loops distal to this point of transition. Findings favor acute small-bowel obstruction secondary to adhesions related to prior hernia repair. There is also mild circumferential thickening of multiple dilated and nondilated small bowel loops which exhibit prominence of vasa recta and inter-bowel free fluid, suggesting superimposed enteritis. There is no pneumatosis or portal venous  gas. There is no walled-off abscess or loculated collection. Note is made of small amount of ascites, likely reactive. No evidence of pneumoperitoneum. Vascular/Lymphatic: No abdominal or pelvic lymphadenopathy, by size criteria. No aneurysmal dilation of the major abdominal arteries. Reproductive: The uterus is unremarkable. No large adnexal mass. Other: Infraumbilical midline surgical scar noted. There is heterogeneous calcification with extension into the peritoneal space, which may represent combination of calcified hernia mesh with fat necrosis. This is likely the culprit/source of adhesions with small bowel loops leading to small-bowel obstruction. Musculoskeletal: No suspicious osseous lesions. IMPRESSION: *Findings, as described in detail above suggest small bowel obstruction with point of transition  in the midline lower abdomen. At this point, there are probable adhesions between the small bowel loops and dystrophic calcification from prior hernia repair. There also changes of coexistent enteritis. No abscess, collection, pneumatosis, pneumoperitoneum or portal venous gas. *Multiple other nonacute observations, as described above. Electronically Signed   By: Jules Schick M.D.   On: 11/18/2022 14:14    Procedures Procedures    Medications Ordered in ED Medications  cefTRIAXone (ROCEPHIN) 1 g in sodium chloride 0.9 % 100 mL IVPB (1 g Intravenous New Bag/Given 11/18/22 1413)  ondansetron (ZOFRAN-ODT) disintegrating tablet 4 mg (4 mg Oral Given 11/18/22 0807)  sodium chloride 0.9 % bolus 1,000 mL (0 mLs Intravenous Stopped 11/18/22 1102)  morphine (PF) 4 MG/ML injection 4 mg (4 mg Intravenous Given 11/18/22 0929)  iohexol (OMNIPAQUE) 350 MG/ML injection 75 mL (75 mLs Intravenous Contrast Given 11/18/22 1059)    ED Course/ Medical Decision Making/ A&P                                 Medical Decision Making Adult female with risk factors for bowel obstruction presents with nausea, vomiting, abdominal pain.  Bowel obstruction versus diverticulitis, or other intra-abdominal processes are concerning.  Patient placed on continuous monitoring, morphine, Zofran for symptom control. Cardiac 90 sinus normal Pulse ox 100% room air normal   Amount and/or Complexity of Data Reviewed Labs: ordered. Decision-making details documented in ED Course. Radiology: ordered and independent interpretation performed. Decision-making details documented in ED Course.  Risk Prescription drug management. Decision regarding hospitalization.   2:28 PM Patient in similar condition, pain slightly improved.  Findings reviewed, discussed, patient with evidence for both bowel obstruction and urinary tract infection.  Possibly also enteritis though this may be reactive. Patient is not hypotensive, little evidence  for bacteremia, sepsis.  I discussed her case with our general surgery colleagues and will follow as a consulting service, patient admitted to internal medicine.        Final Clinical Impression(s) / ED Diagnoses Final diagnoses:  SBO (small bowel obstruction) (HCC)  Lower urinary tract infectious disease    Rx / DC Orders ED Discharge Orders     None         Gerhard Munch, MD 11/18/22 1612

## 2022-11-18 NOTE — ED Notes (Signed)
Pt attempted urine sample and unable to given one currently.

## 2022-11-19 ENCOUNTER — Inpatient Hospital Stay (HOSPITAL_COMMUNITY): Payer: BC Managed Care – PPO

## 2022-11-19 ENCOUNTER — Encounter (HOSPITAL_COMMUNITY): Payer: Self-pay | Admitting: Obstetrics and Gynecology

## 2022-11-19 DIAGNOSIS — R197 Diarrhea, unspecified: Secondary | ICD-10-CM | POA: Diagnosis not present

## 2022-11-19 DIAGNOSIS — E1165 Type 2 diabetes mellitus with hyperglycemia: Secondary | ICD-10-CM | POA: Diagnosis not present

## 2022-11-19 DIAGNOSIS — K56609 Unspecified intestinal obstruction, unspecified as to partial versus complete obstruction: Secondary | ICD-10-CM

## 2022-11-19 DIAGNOSIS — N3000 Acute cystitis without hematuria: Secondary | ICD-10-CM | POA: Diagnosis not present

## 2022-11-19 LAB — TROPONIN I (HIGH SENSITIVITY)
Troponin I (High Sensitivity): 16 ng/L (ref <18)
Troponin I (High Sensitivity): 17 ng/L (ref <18)

## 2022-11-19 LAB — GASTROINTESTINAL PANEL BY PCR, STOOL (REPLACES STOOL CULTURE)

## 2022-11-19 LAB — BASIC METABOLIC PANEL
Anion gap: 8 (ref 5–15)
BUN: 7 mg/dL (ref 6–20)
CO2: 29 mmol/L (ref 22–32)
Calcium: 8.9 mg/dL (ref 8.9–10.3)
Chloride: 103 mmol/L (ref 98–111)
Creatinine, Ser: 0.78 mg/dL (ref 0.44–1.00)
GFR, Estimated: >60 mL/min (ref >60)
Glucose, Bld: 227 mg/dL — ABNORMAL HIGH (ref 70–99)
Potassium: 3.6 mmol/L (ref 3.5–5.1)
Sodium: 140 mmol/L (ref 135–145)

## 2022-11-19 LAB — GLUCOSE, CAPILLARY
Glucose-Capillary: 157 mg/dL — ABNORMAL HIGH (ref 70–99)
Glucose-Capillary: 177 mg/dL — ABNORMAL HIGH (ref 70–99)
Glucose-Capillary: 198 mg/dL — ABNORMAL HIGH (ref 70–99)
Glucose-Capillary: 208 mg/dL — ABNORMAL HIGH (ref 70–99)
Glucose-Capillary: 212 mg/dL — ABNORMAL HIGH (ref 70–99)
Glucose-Capillary: 257 mg/dL — ABNORMAL HIGH (ref 70–99)

## 2022-11-19 LAB — HIV ANTIBODY (ROUTINE TESTING W REFLEX): HIV Screen 4th Generation wRfx: NONREACTIVE

## 2022-11-19 LAB — TYPE AND SCREEN
ABO/RH(D): B POS
Antibody Screen: NEGATIVE

## 2022-11-19 LAB — ABO/RH: ABO/RH(D): B POS

## 2022-11-19 MED ORDER — ALUM & MAG HYDROXIDE-SIMETH 200-200-20 MG/5ML PO SUSP
15.0000 mL | ORAL | Status: DC | PRN
  Administered 2022-11-19: 15 mL via ORAL
  Filled 2022-11-19: qty 30

## 2022-11-19 MED ORDER — MORPHINE SULFATE (PF) 2 MG/ML IV SOLN
2.0000 mg | INTRAVENOUS | Status: DC | PRN
  Administered 2022-11-19: 2 mg via INTRAVENOUS
  Filled 2022-11-19: qty 1

## 2022-11-19 MED ORDER — IBUPROFEN 600 MG PO TABS
600.0000 mg | ORAL_TABLET | Freq: Once | ORAL | Status: AC
  Administered 2022-11-19: 600 mg via ORAL
  Filled 2022-11-19: qty 1

## 2022-11-19 MED ORDER — NITROGLYCERIN 0.4 MG SL SUBL
0.4000 mg | SUBLINGUAL_TABLET | SUBLINGUAL | Status: DC | PRN
  Administered 2022-11-19: 0.4 mg via SUBLINGUAL
  Filled 2022-11-19: qty 1

## 2022-11-19 MED ORDER — PANTOPRAZOLE SODIUM 40 MG IV SOLR
40.0000 mg | INTRAVENOUS | Status: DC
  Administered 2022-11-19: 40 mg via INTRAVENOUS
  Filled 2022-11-19: qty 10

## 2022-11-19 MED ORDER — METHOCARBAMOL 500 MG PO TABS
500.0000 mg | ORAL_TABLET | Freq: Four times a day (QID) | ORAL | Status: DC | PRN
  Administered 2022-11-19: 500 mg via ORAL
  Filled 2022-11-19: qty 1

## 2022-11-19 NOTE — Progress Notes (Signed)
Subjective/Chief Complaint: Patient denies abdominal pain.  X-ray showed: Contrast in colon.  No significant small bowel dilation noted   Objective: Vital signs in last 24 hours: Temp:  [98 F (36.7 C)-100.6 F (38.1 C)] 98.6 F (37 C) (11/14 0401) Pulse Rate:  [78-89] 79 (11/14 0401) Resp:  [16-18] 16 (11/14 0401) BP: (145-184)/(74-96) 153/81 (11/14 0401) SpO2:  [93 %-100 %] 93 % (11/14 0401) Weight:  [81.6 kg] 81.6 kg (11/13 1445) Last BM Date : 11/18/22  Intake/Output from previous day: 11/13 0701 - 11/14 0700 In: 956.7 [I.V.:956.7] Out: -  Intake/Output this shift: No intake/output data recorded.  Abdomen: Soft nontender without rebound or guarding.  Mild distention.  Lab Results:  Recent Labs    11/18/22 0803  WBC 11.7*  HGB 14.3  HCT 45.0  PLT 262   BMET Recent Labs    11/18/22 0803  NA 136  K 3.6  CL 98  CO2 24  GLUCOSE 398*  BUN 14  CREATININE 1.06*  CALCIUM 9.6   PT/INR No results for input(s): "LABPROT", "INR" in the last 72 hours. ABG No results for input(s): "PHART", "HCO3" in the last 72 hours.  Invalid input(s): "PCO2", "PO2"  Studies/Results: DG Abd Portable 1V-Small Bowel Obstruction Protocol-initial, 8 hr delay  Result Date: 11/19/2022 CLINICAL DATA:  Small bowel obstruction, 8 hour follow-up EXAM: PORTABLE ABDOMEN - 1 VIEW COMPARISON:  Abdominal CT from yesterday FINDINGS: Enteric contrast has reached the colon which is nondilated. Lesser small bowel distension when compared to prior scanogram. Midline hernia repair with mesh. No concerning intra-abdominal mass effect or calcification. IMPRESSION: Improved bowel gas pattern. Oral contrast has reached the nondilated colon. Electronically Signed   By: Tiburcio Pea M.D.   On: 11/19/2022 04:55   CT ABDOMEN PELVIS W CONTRAST  Result Date: 11/18/2022 CLINICAL DATA:  Bowel obstruction suspected. Abdominal pain. Vomiting. EXAM: CT ABDOMEN AND PELVIS WITH CONTRAST TECHNIQUE:  Multidetector CT imaging of the abdomen and pelvis was performed using the standard protocol following bolus administration of intravenous contrast. RADIATION DOSE REDUCTION: This exam was performed according to the departmental dose-optimization program which includes automated exposure control, adjustment of the mA and/or kV according to patient size and/or use of iterative reconstruction technique. CONTRAST:  75mL OMNIPAQUE IOHEXOL 350 MG/ML SOLN COMPARISON:  CT scan abdomen and pelvis from 04/23/2019. FINDINGS: Lower chest: The lung bases are clear. No pleural effusion. The heart is normal in size. No pericardial effusion. Hepatobiliary: The liver is normal in size. Non-cirrhotic configuration. No suspicious mass. These is mild diffuse hepatic steatosis. No intrahepatic or extrahepatic bile duct dilation. Gallbladder is surgically absent. Pancreas: Unremarkable. No pancreatic ductal dilatation or surrounding inflammatory changes. Spleen: Within normal limits. No focal lesion. Adrenals/Urinary Tract: Adrenal glands are unremarkable. No suspicious renal mass. There is a nearly completely exophytic approximately 1 cm sized cyst arising from the right kidney lower pole, posteriorly. No hydronephrosis. No renal or ureteric calculi. Urinary bladder is physiologically distended. There is mild circumferential wall thickening with mild perivesical fat stranding, concerning for cystitis. Correlate clinically and with urinalysis. No focal mass or bladder calculi. Stomach/Bowel: There is probable small sliding hiatal hernia. Unremarkable stomach and duodenum. Appendix is in the right lower quadrant and appear unremarkable. There is small-to-moderate amount of stool throughout the colon which is otherwise unremarkable. However, there are multiple dilated small bowel loops measuring up to 3.4-3.5 cm in diameter. There is an area of narrowing in the region of calcified previous hernia repair in the midline lower  abdomen.  Proximal to this point, there is a long segment of fecalized small bowel loop. There also several dilated bowel loops distal to this point of transition. Findings favor acute small-bowel obstruction secondary to adhesions related to prior hernia repair. There is also mild circumferential thickening of multiple dilated and nondilated small bowel loops which exhibit prominence of vasa recta and inter-bowel free fluid, suggesting superimposed enteritis. There is no pneumatosis or portal venous gas. There is no walled-off abscess or loculated collection. Note is made of small amount of ascites, likely reactive. No evidence of pneumoperitoneum. Vascular/Lymphatic: No abdominal or pelvic lymphadenopathy, by size criteria. No aneurysmal dilation of the major abdominal arteries. Reproductive: The uterus is unremarkable. No large adnexal mass. Other: Infraumbilical midline surgical scar noted. There is heterogeneous calcification with extension into the peritoneal space, which may represent combination of calcified hernia mesh with fat necrosis. This is likely the culprit/source of adhesions with small bowel loops leading to small-bowel obstruction. Musculoskeletal: No suspicious osseous lesions. IMPRESSION: *Findings, as described in detail above suggest small bowel obstruction with point of transition in the midline lower abdomen. At this point, there are probable adhesions between the small bowel loops and dystrophic calcification from prior hernia repair. There also changes of coexistent enteritis. No abscess, collection, pneumatosis, pneumoperitoneum or portal venous gas. *Multiple other nonacute observations, as described above. Electronically Signed   By: Jules Schick M.D.   On: 11/18/2022 14:14    Anti-infectives: Anti-infectives (From admission, onward)    Start     Dose/Rate Route Frequency Ordered Stop   11/19/22 1000  cefTRIAXone (ROCEPHIN) 1 g in sodium chloride 0.9 % 100 mL IVPB        1 g 200 mL/hr  over 30 Minutes Intravenous Every 24 hours 11/18/22 1501     11/18/22 1415  cefTRIAXone (ROCEPHIN) 1 g in sodium chloride 0.9 % 100 mL IVPB        1 g 200 mL/hr over 30 Minutes Intravenous  Once 11/18/22 1407 11/18/22 1447       Assessment/Plan: Partial small bowel obstruction-this appears to be resolving with contrast in her colon on the small bowel protocol  Advance diet  If she tolerates a full liquid diet she may have a soft diet and be discharged home per medical service.     LOS: 1 day    Cl[place]is Pu Madge Therrien 11/19/2022 Moderate complexity

## 2022-11-19 NOTE — Plan of Care (Signed)

## 2022-11-19 NOTE — Progress Notes (Signed)
PROGRESS NOTE    [name]  ZOX:[id number] DOB: September 12, 1970 DOA: 11/18/2022 PCP: Marrianne Mood, MD   Brief Narrative:  52 year old female with history of paroxysmal A-fib, hypertension, HOCM, diabetes mellitus type 2, SBO in 2020 presented with worsening abdominal pain with vomiting.  Workup revealed possible SBO along with acute cystitis.  She was started on IV fluids and antibiotics.  General surgery was consulted.  Assessment & Plan:   SBO -Possibly from adhesions -General Surgery following: Continue conservative management -Continue IV fluids, n.p.o., pain management.  Diet advancement as per general surgery  Acute cystitis -Follow urine cultures.  Continue Rocephin  Diarrhea -Likely secondary to SBO but CT also showed signs of enteritis.  Stool for C. difficile negative.  Stool for GI pathogen pending.  Diabetes mellitus type 2 with hyperglycemia -A1c 13.9 -Continue long-acting insulin along with CBGs with SSI  Hypertension -Monitor blood pressure.  Continue losartan and metoprolol  Paroxysmal A-fib -Continue metoprolol.  Xarelto on hold  HOCM -Outpatient follow-up with cardiology  Leukocytosis -Mild.  No labs today   DVT prophylaxis: SCDs Code Status: Full Family Communication: None at bedside Disposition Plan: Status is: Inpatient Remains inpatient appropriate because: Of severity of illness.  Need for IV antibiotics and fluids.  Consultants: General surgery  Procedures: None  Antimicrobials: Rocephin from 11/18/2022 onwards   Subjective: Patient seen and examined at bedside.  Still complains of intermittent abdominal pain but improving.  Had fever last night.  No chest pain or shortness of breath reported.  Objective: Vitals:   11/18/22 1520 11/18/22 2027 11/19/22 0055 11/19/22 0401  BP: (!) 168/89 (!) 154/84 (!) 145/77 (!) 153/81  Pulse: 83 80 78 79  Resp: 18 18 16 16   Temp: 98.9 F (37.2 C) (!) 100.6 F (38.1 C) 98.6 F (37 C) 98.6  F (37 C)  TempSrc: Oral Oral Oral Oral  SpO2: 96% 96% 93% 93%  Weight:      Height:        Intake/Output Summary (Last 24 hours) at 11/19/2022 0750 Last data filed at 11/19/2022 0449 Gross per 24 hour  Intake 956.74 ml  Output --  Net 956.74 ml   Filed Weights   11/18/22 1445  Weight: 81.6 kg    Examination:  General exam: Appears calm and comfortable.  On room air. Respiratory system: Bilateral decreased breath sounds at bases Cardiovascular system: S1 & S2 heard, Rate controlled Gastrointestinal system: Abdomen is distended, soft and mildly tender.  Bowel sounds sluggish  extremities: No cyanosis, clubbing, edema  Central nervous system: Alert and oriented. No focal neurological deficits. Moving extremities Skin: No rashes, lesions or ulcers Psychiatry: Judgement and insight appear normal. Mood & affect appropriate.     Data Reviewed: I have personally reviewed following labs and imaging studies  CBC: Recent Labs  Lab 11/18/22 0803  WBC 11.7*  HGB 14.3  HCT 45.0  MCV 82.1  PLT 262   Basic Metabolic Panel: Recent Labs  Lab 11/18/22 0803  NA 136  K 3.6  CL 98  CO2 24  GLUCOSE 398*  BUN 14  CREATININE 1.06*  CALCIUM 9.6   GFR: Estimated Creatinine Clearance: 67.6 mL/min (A) (by C-G formula based on SCr of 1.06 mg/dL (H)). Liver Function Tests: Recent Labs  Lab 11/18/22 0803  AST 23  ALT 22  ALKPHOS 118  BILITOT 0.7  PROT 8.2*  ALBUMIN 3.8   Recent Labs  Lab 11/18/22 0803  LIPASE 30   No results for input(s): "AMMONIA" in the  last 168 hours. Coagulation Profile: No results for input(s): "INR", "PROTIME" in the last 168 hours. Cardiac Enzymes: No results for input(s): "CKTOTAL", "CKMB", "CKMBINDEX", "TROPONINI" in the last 168 hours. BNP (last 3 results) No results for input(s): "PROBNP" in the last 8760 hours. HbA1C: Recent Labs    11/18/22 0803  HGBA1C 13.9*   CBG: Recent Labs  Lab 11/18/22 1734 11/18/22 2029 11/19/22 0057  11/19/22 0403  GLUCAP 142* 154* 157* 198*   Lipid Profile: No results for input(s): "CHOL", "HDL", "LDLCALC", "TRIG", "CHOLHDL", "LDLDIRECT" in the last 72 hours. Thyroid Function Tests: No results for input(s): "TSH", "T4TOTAL", "FREET4", "T3FREE", "THYROIDAB" in the last 72 hours. Anemia Panel: No results for input(s): "VITAMINB12", "FOLATE", "FERRITIN", "TIBC", "IRON", "RETICCTPCT" in the last 72 hours. Sepsis Labs: No results for input(s): "PROCALCITON", "LATICACIDVEN" in the last 168 hours.  Recent Results (from the past 240 hour(s))  C Difficile Quick Screen w PCR reflex     Status: None   Collection Time: 11/18/22  3:02 PM   Specimen: Stool  Result Value Ref Range Status   C Diff antigen NEGATIVE NEGATIVE Final   C Diff toxin NEGATIVE NEGATIVE Final   C Diff interpretation No C. difficile detected.  Final    Comment: Performed at [facility], [address] [place], [place] [address]         Radiology Studies: DG Abd Portable 1V-Small Bowel Obstruction Protocol-initial, 8 hr delay  Result Date: 11/19/2022 CLINICAL DATA:  Small bowel obstruction, 8 hour follow-up EXAM: PORTABLE ABDOMEN - 1 VIEW COMPARISON:  Abdominal CT from yesterday FINDINGS: Enteric contrast has reached the colon which is nondilated. Lesser small bowel distension when compared to prior scanogram. Midline hernia repair with mesh. No concerning intra-abdominal mass effect or calcification. IMPRESSION: Improved bowel gas pattern. Oral contrast has reached the nondilated colon. Electronically Signed   By: Tiburcio Pea M.D.   On: 11/19/2022 04:55   CT ABDOMEN PELVIS W CONTRAST  Result Date: 11/18/2022 CLINICAL DATA:  Bowel obstruction suspected. Abdominal pain. Vomiting. EXAM: CT ABDOMEN AND PELVIS WITH CONTRAST TECHNIQUE: Multidetector CT imaging of the abdomen and pelvis was performed using the standard protocol following bolus administration of intravenous contrast. RADIATION DOSE REDUCTION:  This exam was performed according to the departmental dose-optimization program which includes automated exposure control, adjustment of the mA and/or kV according to patient size and/or use of iterative reconstruction technique. CONTRAST:  75mL OMNIPAQUE IOHEXOL 350 MG/ML SOLN COMPARISON:  CT scan abdomen and pelvis from 04/23/2019. FINDINGS: Lower chest: The lung bases are clear. No pleural effusion. The heart is normal in size. No pericardial effusion. Hepatobiliary: The liver is normal in size. Non-cirrhotic configuration. No suspicious mass. These is mild diffuse hepatic steatosis. No intrahepatic or extrahepatic bile duct dilation. Gallbladder is surgically absent. Pancreas: Unremarkable. No pancreatic ductal dilatation or surrounding inflammatory changes. Spleen: Within normal limits. No focal lesion. Adrenals/Urinary Tract: Adrenal glands are unremarkable. No suspicious renal mass. There is a nearly completely exophytic approximately 1 cm sized cyst arising from the right kidney lower pole, posteriorly. No hydronephrosis. No renal or ureteric calculi. Urinary bladder is physiologically distended. There is mild circumferential wall thickening with mild perivesical fat stranding, concerning for cystitis. Correlate clinically and with urinalysis. No focal mass or bladder calculi. Stomach/Bowel: There is probable small sliding hiatal hernia. Unremarkable stomach and duodenum. Appendix is in the right lower quadrant and appear unremarkable. There is small-to-moderate amount of stool throughout the colon which is otherwise unremarkable. However, there are  multiple dilated small bowel loops measuring up to 3.4-3.5 cm in diameter. There is an area of narrowing in the region of calcified previous hernia repair in the midline lower abdomen. Proximal to this point, there is a long segment of fecalized small bowel loop. There also several dilated bowel loops distal to this point of transition. Findings favor acute  small-bowel obstruction secondary to adhesions related to prior hernia repair. There is also mild circumferential thickening of multiple dilated and nondilated small bowel loops which exhibit prominence of vasa recta and inter-bowel free fluid, suggesting superimposed enteritis. There is no pneumatosis or portal venous gas. There is no walled-off abscess or loculated collection. Note is made of small amount of ascites, likely reactive. No evidence of pneumoperitoneum. Vascular/Lymphatic: No abdominal or pelvic lymphadenopathy, by size criteria. No aneurysmal dilation of the major abdominal arteries. Reproductive: The uterus is unremarkable. No large adnexal mass. Other: Infraumbilical midline surgical scar noted. There is heterogeneous calcification with extension into the peritoneal space, which may represent combination of calcified hernia mesh with fat necrosis. This is likely the culprit/source of adhesions with small bowel loops leading to small-bowel obstruction. Musculoskeletal: No suspicious osseous lesions. IMPRESSION: *Findings, as described in detail above suggest small bowel obstruction with point of transition in the midline lower abdomen. At this point, there are probable adhesions between the small bowel loops and dystrophic calcification from prior hernia repair. There also changes of coexistent enteritis. No abscess, collection, pneumatosis, pneumoperitoneum or portal venous gas. *Multiple other nonacute observations, as described above. Electronically Signed   By: Jules Schick M.D.   On: 11/18/2022 14:14        Scheduled Meds:  insulin aspart  0-15 Units Subcutaneous TID WC   insulin aspart  0-5 Units Subcutaneous QHS   insulin glargine-yfgn  20 Units Subcutaneous QHS   losartan  100 mg Oral Daily   metoprolol succinate  75 mg Oral Daily   Continuous Infusions:  cefTRIAXone (ROCEPHIN)  IV     lactated ringers 100 mL/hr at 11/19/22 0424          Glade Lloyd, MD [facility] 11/19/2022, 7:50 AM

## 2022-11-19 NOTE — Progress Notes (Signed)
   11/19/22 1355  TOC Brief Assessment  Insurance and Status Reviewed  Patient has primary care physician Yes  Home environment has been reviewed yes  Prior level of function: independent  Prior/Current Home Services No current home services  Social Determinants of Health Reivew SDOH reviewed no interventions necessary  Readmission risk has been reviewed Yes  Transition of care needs no transition of care needs at this time       Transition of Care Department [facility]) has reviewed patient and no TOC needs have been identified at this time. We will continue to monitor patient advancement through interdisciplinary progression rounds. If new patient transition needs arise, please place a TOC consult.

## 2022-11-19 NOTE — Inpatient Diabetes Management (Signed)
Inpatient Diabetes Program Recommendations  AACE/ADA: New Consensus Statement on Inpatient Glycemic Control (2015)  Target Ranges:  Prepandial:   less than 140 mg/dL      Peak postprandial:   less than 180 mg/dL (1-2 hours)      Critically ill patients:  140 - 180 mg/dL   Lab Results  Component Value Date   GLUCAP 257 (H) 11/19/2022   HGBA1C 13.9 (H) 11/18/2022    Review of Glycemic Control  Diabetes history: type 2 Outpatient Diabetes medications: Xultophy 20 units at HS, Synjardy 12.05-998 mg BID Current orders for Inpatient glycemic control: Semglee 20 units at HS, Novolog 0-15 units correction scale TID, Novolog 0-5 units HS scale  Inpatient Diabetes Program Recommendations:   Spoke to patient at the bedside. Reviewed current insulin orders and oral meds for diabetes. States that she was diagnosed with diabetes in 2006. Discussed hgbA1C of 13.9% which she says has been high for some time. States that she has discussed with her physician. Patient states that she lives with her daughter.   Will continue to monitor blood sugars while in the hospital.  Smith Mince RN BSN CDE Diabetes Coordinator Pager: [phone]  8am-5pm

## 2022-11-19 NOTE — Progress Notes (Signed)
MD notified that patient is having chest pain on and off. 8/10 pain.

## 2022-11-20 ENCOUNTER — Other Ambulatory Visit (HOSPITAL_COMMUNITY): Payer: Self-pay

## 2022-11-20 DIAGNOSIS — K56609 Unspecified intestinal obstruction, unspecified as to partial versus complete obstruction: Secondary | ICD-10-CM | POA: Diagnosis not present

## 2022-11-20 LAB — CBC WITH DIFFERENTIAL/PLATELET
Abs Immature Granulocytes: 0.01 10*3/uL (ref 0.00–0.07)
Basophils Absolute: 0 10*3/uL (ref 0.0–0.1)
Basophils Relative: 0 %
Eosinophils Absolute: 0.1 10*3/uL (ref 0.0–0.5)
Eosinophils Relative: 2 %
HCT: 38.3 % (ref 36.0–46.0)
Hemoglobin: 11.9 g/dL — ABNORMAL LOW (ref 12.0–15.0)
Immature Granulocytes: 0 %
Lymphocytes Relative: 39 %
Lymphs Abs: 2.5 10*3/uL (ref 0.7–4.0)
MCH: 25.3 pg — ABNORMAL LOW (ref 26.0–34.0)
MCHC: 31.1 g/dL (ref 30.0–36.0)
MCV: 81.5 fL (ref 80.0–100.0)
Monocytes Absolute: 0.7 10*3/uL (ref 0.1–1.0)
Monocytes Relative: 11 %
Neutro Abs: 3.2 10*3/uL (ref 1.7–7.7)
Neutrophils Relative %: 48 %
Platelets: 217 10*3/uL (ref 150–400)
RBC: 4.7 MIL/uL (ref 3.87–5.11)
RDW: 13.7 % (ref 11.5–15.5)
WBC: 6.6 10*3/uL (ref 4.0–10.5)
nRBC: 0 % (ref 0.0–0.2)

## 2022-11-20 LAB — COMPREHENSIVE METABOLIC PANEL
ALT: 22 U/L (ref 0–44)
AST: 23 U/L (ref 15–41)
Albumin: 3 g/dL — ABNORMAL LOW (ref 3.5–5.0)
Alkaline Phosphatase: 91 U/L (ref 38–126)
Anion gap: 8 (ref 5–15)
BUN: 7 mg/dL (ref 6–20)
CO2: 30 mmol/L (ref 22–32)
Calcium: 8.9 mg/dL (ref 8.9–10.3)
Chloride: 101 mmol/L (ref 98–111)
Creatinine, Ser: 0.75 mg/dL (ref 0.44–1.00)
GFR, Estimated: >60 mL/min (ref >60)
Glucose, Bld: 257 mg/dL — ABNORMAL HIGH (ref 70–99)
Potassium: 4 mmol/L (ref 3.5–5.1)
Sodium: 139 mmol/L (ref 135–145)
Total Bilirubin: 0.4 mg/dL (ref <1.2)
Total Protein: 6.8 g/dL (ref 6.5–8.1)

## 2022-11-20 LAB — GLUCOSE, CAPILLARY: Glucose-Capillary: 264 mg/dL — ABNORMAL HIGH (ref 70–99)

## 2022-11-20 LAB — MAGNESIUM: Magnesium: 2 mg/dL (ref 1.7–2.4)

## 2022-11-20 MED ORDER — POLYETHYLENE GLYCOL 3350 17 G PO PACK
17.0000 g | PACK | Freq: Every day | ORAL | 0 refills | Status: DC | PRN
  Filled 2022-11-20: qty 14, 14d supply, fill #0

## 2022-11-20 MED ORDER — NITROGLYCERIN 0.4 MG SL SUBL
0.4000 mg | SUBLINGUAL_TABLET | SUBLINGUAL | 0 refills | Status: AC | PRN
  Filled 2022-11-20: qty 25, 30d supply, fill #0

## 2022-11-20 MED ORDER — METHOCARBAMOL 500 MG PO TABS
500.0000 mg | ORAL_TABLET | Freq: Four times a day (QID) | ORAL | 0 refills | Status: DC | PRN
  Filled 2022-11-20: qty 30, 8d supply, fill #0

## 2022-11-20 MED ORDER — ALUM & MAG HYDROXIDE-SIMETH 200-200-20 MG/5ML PO SUSP
15.0000 mL | Freq: Four times a day (QID) | ORAL | 0 refills | Status: DC | PRN
  Filled 2022-11-20 (×2): qty 355, 6d supply, fill #0

## 2022-11-20 MED ORDER — PANTOPRAZOLE SODIUM 40 MG PO TBEC
40.0000 mg | DELAYED_RELEASE_TABLET | Freq: Every day | ORAL | 0 refills | Status: DC
  Filled 2022-11-20: qty 30, 30d supply, fill #0

## 2022-11-20 NOTE — Progress Notes (Signed)
MD Note given to patient for work. Escorted to main entrance via WC. She ambulated to her parked car in lot.

## 2022-11-20 NOTE — Progress Notes (Signed)
Patient ID: [name], female   DOB: July 07, 1970, 52 y.o.   MRN: [id number] [facility] Surgery Progress Note     Subjective: CC-  Feeling better. Tolerating diet. Passing flatus, BM yesterday.   Objective: Vital signs in last 24 hours: Temp:  [98 F (36.7 C)-98.7 F (37.1 C)] 98.7 F (37.1 C) (11/15 0811) Pulse Rate:  [62-73] 72 (11/15 0811) Resp:  [16-18] 17 (11/15 0811) BP: (144-177)/(75-83) 144/78 (11/15 0811) SpO2:  [97 %-100 %] 100 % (11/15 0811) Last BM Date : 11/18/22  Intake/Output from previous day: 11/14 0701 - 11/15 0700 In: 740.1 [P.O.:640; IV Piggyback:100.1] Out: -  Intake/Output this shift: No intake/output data recorded.  PE: Gen:  Alert, NAD, pleasant Abd: soft, ND, NT  Lab Results:  Recent Labs    11/18/22 0803 11/20/22 0706  WBC 11.7* 6.6  HGB 14.3 11.9*  HCT 45.0 38.3  PLT 262 217   BMET Recent Labs    11/19/22 0842 11/20/22 0706  NA 140 139  K 3.6 4.0  CL 103 101  CO2 29 30  GLUCOSE 227* 257*  BUN 7 7  CREATININE 0.78 0.75  CALCIUM 8.9 8.9   PT/INR No results for input(s): "LABPROT", "INR" in the last 72 hours. CMP     Component Value Date/Time   NA 139 11/20/2022 0706   NA 137 06/03/2022 1503   K 4.0 11/20/2022 0706   CL 101 11/20/2022 0706   CO2 30 11/20/2022 0706   GLUCOSE 257 (H) 11/20/2022 0706   BUN 7 11/20/2022 0706   BUN 13 06/03/2022 1503   CREATININE 0.75 11/20/2022 0706   CALCIUM 8.9 11/20/2022 0706   PROT 6.8 11/20/2022 0706   ALBUMIN 3.0 (L) 11/20/2022 0706   AST 23 11/20/2022 0706   ALT 22 11/20/2022 0706   ALKPHOS 91 11/20/2022 0706   BILITOT 0.4 11/20/2022 0706   GFRNONAA >60 11/20/2022 0706   GFRAA 98 12/07/2019 0950   Lipase     Component Value Date/Time   LIPASE 30 11/18/2022 0803       Studies/Results: DG CHEST PORT 1 VIEW  Result Date: 11/19/2022 CLINICAL DATA:  Dyspnea EXAM: PORTABLE CHEST 1 VIEW COMPARISON:  Chest x-ray 09/18/2022 FINDINGS: The heart size and mediastinal  contours are within normal limits. There is minimal atelectasis in the lung bases. The lungs are otherwise clear. There is no pleural effusion or pneumothorax. The visualized skeletal structures are unremarkable. IMPRESSION: Minimal atelectasis in the lung bases. Electronically Signed   By: Darliss Cheney M.D.   On: 11/19/2022 19:51   DG Abd Portable 1V-Small Bowel Obstruction Protocol-initial, 8 hr delay  Result Date: 11/19/2022 CLINICAL DATA:  Small bowel obstruction, 8 hour follow-up EXAM: PORTABLE ABDOMEN - 1 VIEW COMPARISON:  Abdominal CT from yesterday FINDINGS: Enteric contrast has reached the colon which is nondilated. Lesser small bowel distension when compared to prior scanogram. Midline hernia repair with mesh. No concerning intra-abdominal mass effect or calcification. IMPRESSION: Improved bowel gas pattern. Oral contrast has reached the nondilated colon. Electronically Signed   By: Tiburcio Pea M.D.   On: 11/19/2022 04:55   CT ABDOMEN PELVIS W CONTRAST  Result Date: 11/18/2022 CLINICAL DATA:  Bowel obstruction suspected. Abdominal pain. Vomiting. EXAM: CT ABDOMEN AND PELVIS WITH CONTRAST TECHNIQUE: Multidetector CT imaging of the abdomen and pelvis was performed using the standard protocol following bolus administration of intravenous contrast. RADIATION DOSE REDUCTION: This exam was performed according to the departmental dose-optimization program which includes automated exposure control, adjustment of  the mA and/or kV according to patient size and/or use of iterative reconstruction technique. CONTRAST:  75mL OMNIPAQUE IOHEXOL 350 MG/ML SOLN COMPARISON:  CT scan abdomen and pelvis from 04/23/2019. FINDINGS: Lower chest: The lung bases are clear. No pleural effusion. The heart is normal in size. No pericardial effusion. Hepatobiliary: The liver is normal in size. Non-cirrhotic configuration. No suspicious mass. These is mild diffuse hepatic steatosis. No intrahepatic or extrahepatic bile  duct dilation. Gallbladder is surgically absent. Pancreas: Unremarkable. No pancreatic ductal dilatation or surrounding inflammatory changes. Spleen: Within normal limits. No focal lesion. Adrenals/Urinary Tract: Adrenal glands are unremarkable. No suspicious renal mass. There is a nearly completely exophytic approximately 1 cm sized cyst arising from the right kidney lower pole, posteriorly. No hydronephrosis. No renal or ureteric calculi. Urinary bladder is physiologically distended. There is mild circumferential wall thickening with mild perivesical fat stranding, concerning for cystitis. Correlate clinically and with urinalysis. No focal mass or bladder calculi. Stomach/Bowel: There is probable small sliding hiatal hernia. Unremarkable stomach and duodenum. Appendix is in the right lower quadrant and appear unremarkable. There is small-to-moderate amount of stool throughout the colon which is otherwise unremarkable. However, there are multiple dilated small bowel loops measuring up to 3.4-3.5 cm in diameter. There is an area of narrowing in the region of calcified previous hernia repair in the midline lower abdomen. Proximal to this point, there is a long segment of fecalized small bowel loop. There also several dilated bowel loops distal to this point of transition. Findings favor acute small-bowel obstruction secondary to adhesions related to prior hernia repair. There is also mild circumferential thickening of multiple dilated and nondilated small bowel loops which exhibit prominence of vasa recta and inter-bowel free fluid, suggesting superimposed enteritis. There is no pneumatosis or portal venous gas. There is no walled-off abscess or loculated collection. Note is made of small amount of ascites, likely reactive. No evidence of pneumoperitoneum. Vascular/Lymphatic: No abdominal or pelvic lymphadenopathy, by size criteria. No aneurysmal dilation of the major abdominal arteries. Reproductive: The uterus is  unremarkable. No large adnexal mass. Other: Infraumbilical midline surgical scar noted. There is heterogeneous calcification with extension into the peritoneal space, which may represent combination of calcified hernia mesh with fat necrosis. This is likely the culprit/source of adhesions with small bowel loops leading to small-bowel obstruction. Musculoskeletal: No suspicious osseous lesions. IMPRESSION: *Findings, as described in detail above suggest small bowel obstruction with point of transition in the midline lower abdomen. At this point, there are probable adhesions between the small bowel loops and dystrophic calcification from prior hernia repair. There also changes of coexistent enteritis. No abscess, collection, pneumatosis, pneumoperitoneum or portal venous gas. *Multiple other nonacute observations, as described above. Electronically Signed   By: Jules Schick M.D.   On: 11/18/2022 14:14    Anti-infectives: Anti-infectives (From admission, onward)    Start     Dose/Rate Route Frequency Ordered Stop   11/19/22 1000  cefTRIAXone (ROCEPHIN) 1 g in sodium chloride 0.9 % 100 mL IVPB        1 g 200 mL/hr over 30 Minutes Intravenous Every 24 hours 11/18/22 1501 11/20/22 1003   11/18/22 1415  cefTRIAXone (ROCEPHIN) 1 g in sodium chloride 0.9 % 100 mL IVPB        1 g 200 mL/hr over 30 Minutes Intravenous  Once 11/18/22 1407 11/18/22 1447        Assessment/Plan SBO - Resolving. Tolerating solid food diet and having bowel function. Ok for discharge from surgical  standpoint.   ID - none VTE - ok to resume xarelto FEN - HH/CM diet Foley - none   A fib on xarelto (last dose 11/12 in PM) HTN DM  I reviewed hospitalist notes, last 24 h vitals and pain scores, last 48 h intake and output, last 24 h labs and trends, and last 24 h imaging results.    LOS: 2 days    Franne Forts, [facility] Surgery 11/20/2022, 10:20 AM Please see Amion for pager number during day hours  7:00am-4:30pm

## 2022-11-20 NOTE — Progress Notes (Signed)
Pt's primary nurse is working on discharge, he is contacting the pt's MD for a work note.   Ortencia Askari,RN SWOT

## 2022-11-20 NOTE — Discharge Summary (Signed)
Physician Discharge Summary  [name] ATF:[id number] DOB: 1970-05-06 DOA: 11/18/2022  PCP: Marrianne Mood, MD  Admit date: 11/18/2022 Discharge date: 11/20/2022  Admitted From: Home Disposition: Home  Recommendations for Outpatient Follow-up:  Follow up with PCP in 1 week with repeat CBC/BMP Outpatient follow-up with general surgery if needed Follow up in ED if symptoms worsen or new appear   Home Health: No Equipment/Devices: None  Discharge Condition: Stable CODE STATUS: Full Diet recommendation: Heart healthy  Brief/Interim Summary: 52 year old female with history of paroxysmal A-fib, hypertension, HOCM, diabetes mellitus type 2, SBO in 2020 presented with worsening abdominal pain with vomiting.  Workup revealed possible SBO along with acute cystitis.  She was started on IV fluids and antibiotics.  General surgery was consulted.  She was managed conservatively.  Subsequently, diet has been advanced and she is tolerating it.  General surgery has already signed off after abdominal x-ray showed contrast in colon with no significant small bowel dilatation.  She was treated with 3 days of IV Rocephin for possible UTI.  She feels much better and wants to go home today.  She will be discharged home today with outpatient follow-up with PCP.  Discharge Diagnoses:   SBO -Possibly from adhesions -General surgery was consulted.  She was managed conservatively.  Subsequently, diet has been advanced and she is tolerating it.  General surgery has already signed off after abdominal x-ray showed contrast in colon with no significant small bowel dilatation.   -Discharge patient home today.   Acute cystitis -Urine culture negative so far.  Treated with 3 days of Rocephin.  No further antibiotics needed on discharge.  Diarrhea -Likely secondary to SBO but CT also showed signs of enteritis.  Stool for C. difficile negative.  Stool for GI pathogen negative  Diabetes mellitus type 2 with  hyperglycemia -A1c 13.9 -Continue home regimen.  Carb modified diet.  Hypertension -Outpatient follow-up with PCP.  Continue losartan and metoprolol   Paroxysmal A-fib -Continue metoprolol.  Resume Xarelto.  HOCM -Outpatient follow-up with cardiology   Leukocytosis -Resolved  Discharge Instructions  Discharge Instructions     Diet - low sodium heart healthy   Complete by: As directed    Increase activity slowly   Complete by: As directed       Allergies as of 11/20/2022   No Known Allergies      Medication List     STOP taking these medications    cloNIDine 0.1 mg/24hr patch Commonly known as: CATAPRES - Dosed in mg/24 hr   verapamil 120 MG CR tablet Commonly known as: CALAN-SR       TAKE these medications    Accu-Chek Guide test strip Generic drug: glucose blood Use as directed to check blood sugar once daily.   Accu-Chek Guide w/Device Kit Use to check blood sugar once daily.   Accu-Chek Softclix Lancets lancets Use as instructed   acetaminophen 500 MG tablet Commonly known as: TYLENOL Take 500 mg by mouth every 6 (six) hours as needed for moderate pain or headache.   alum & mag hydroxide-simeth 200-200-20 MG/5ML suspension Commonly known as: MAALOX/MYLANTA Take 15 mLs by mouth every 6 (six) hours as needed for indigestion or heartburn.   Insulin Degludec-Liraglutide 100-3.6 UNIT-MG/ML Sopn Commonly known as: XULTOPHY Please Inject 20 units into the skin once daily this week, then increase to 24 units into the skin daily next week and stay on that dose. What changed:  how much to take how to take this   losartan  100 MG tablet Commonly known as: Cozaar Take 1 tablet (100 mg total) by mouth daily.   methocarbamol 500 MG tablet Commonly known as: ROBAXIN Take 1 tablet (500 mg total) by mouth every 6 (six) hours as needed for muscle spasms.   metoprolol succinate 25 MG 24 hr tablet Commonly known as: TOPROL-XL Take 3 tablets (75 mg  total) by mouth daily.   nitroGLYCERIN 0.4 MG SL tablet Commonly known as: NITROSTAT Place 1 tablet (0.4 mg total) under the tongue every 5 (five) minutes as needed for chest pain.   pantoprazole 40 MG tablet Commonly known as: Protonix Take 1 tablet (40 mg total) by mouth daily.   Pen Needles 32G X 4 MM Misc use as directed   polyethylene glycol 17 g packet Commonly known as: MiraLax Take 17 g by mouth daily as needed for mild constipation or moderate constipation.   rivaroxaban 20 MG Tabs tablet Commonly known as: XARELTO Take 1 tablet (20 mg total) by mouth daily with supper.   Synjardy 12.05-998 MG Tabs Generic drug: Empagliflozin-metFORMIN HCl Take 1 tablet by mouth 2 (two) times daily.        Follow-up Information     Marrianne Mood, MD. Schedule an appointment as soon as possible for a visit in 1 week(s).   Specialty: Internal Medicine Contact information: [address] [place] [address] [phone]                No Known Allergies  Consultations: General surgery   Procedures/Studies: DG CHEST PORT 1 VIEW  Result Date: 11/19/2022 CLINICAL DATA:  Dyspnea EXAM: PORTABLE CHEST 1 VIEW COMPARISON:  Chest x-ray 09/18/2022 FINDINGS: The heart size and mediastinal contours are within normal limits. There is minimal atelectasis in the lung bases. The lungs are otherwise clear. There is no pleural effusion or pneumothorax. The visualized skeletal structures are unremarkable. IMPRESSION: Minimal atelectasis in the lung bases. Electronically Signed   By: Darliss Cheney M.D.   On: 11/19/2022 19:51   DG Abd Portable 1V-Small Bowel Obstruction Protocol-initial, 8 hr delay  Result Date: 11/19/2022 CLINICAL DATA:  Small bowel obstruction, 8 hour follow-up EXAM: PORTABLE ABDOMEN - 1 VIEW COMPARISON:  Abdominal CT from yesterday FINDINGS: Enteric contrast has reached the colon which is nondilated. Lesser small bowel distension when compared to prior scanogram.  Midline hernia repair with mesh. No concerning intra-abdominal mass effect or calcification. IMPRESSION: Improved bowel gas pattern. Oral contrast has reached the nondilated colon. Electronically Signed   By: Tiburcio Pea M.D.   On: 11/19/2022 04:55   CT ABDOMEN PELVIS W CONTRAST  Result Date: 11/18/2022 CLINICAL DATA:  Bowel obstruction suspected. Abdominal pain. Vomiting. EXAM: CT ABDOMEN AND PELVIS WITH CONTRAST TECHNIQUE: Multidetector CT imaging of the abdomen and pelvis was performed using the standard protocol following bolus administration of intravenous contrast. RADIATION DOSE REDUCTION: This exam was performed according to the departmental dose-optimization program which includes automated exposure control, adjustment of the mA and/or kV according to patient size and/or use of iterative reconstruction technique. CONTRAST:  75mL OMNIPAQUE IOHEXOL 350 MG/ML SOLN COMPARISON:  CT scan abdomen and pelvis from 04/23/2019. FINDINGS: Lower chest: The lung bases are clear. No pleural effusion. The heart is normal in size. No pericardial effusion. Hepatobiliary: The liver is normal in size. Non-cirrhotic configuration. No suspicious mass. These is mild diffuse hepatic steatosis. No intrahepatic or extrahepatic bile duct dilation. Gallbladder is surgically absent. Pancreas: Unremarkable. No pancreatic ductal dilatation or surrounding inflammatory changes. Spleen: Within normal limits. No  focal lesion. Adrenals/Urinary Tract: Adrenal glands are unremarkable. No suspicious renal mass. There is a nearly completely exophytic approximately 1 cm sized cyst arising from the right kidney lower pole, posteriorly. No hydronephrosis. No renal or ureteric calculi. Urinary bladder is physiologically distended. There is mild circumferential wall thickening with mild perivesical fat stranding, concerning for cystitis. Correlate clinically and with urinalysis. No focal mass or bladder calculi. Stomach/Bowel: There is  probable small sliding hiatal hernia. Unremarkable stomach and duodenum. Appendix is in the right lower quadrant and appear unremarkable. There is small-to-moderate amount of stool throughout the colon which is otherwise unremarkable. However, there are multiple dilated small bowel loops measuring up to 3.4-3.5 cm in diameter. There is an area of narrowing in the region of calcified previous hernia repair in the midline lower abdomen. Proximal to this point, there is a long segment of fecalized small bowel loop. There also several dilated bowel loops distal to this point of transition. Findings favor acute small-bowel obstruction secondary to adhesions related to prior hernia repair. There is also mild circumferential thickening of multiple dilated and nondilated small bowel loops which exhibit prominence of vasa recta and inter-bowel free fluid, suggesting superimposed enteritis. There is no pneumatosis or portal venous gas. There is no walled-off abscess or loculated collection. Note is made of small amount of ascites, likely reactive. No evidence of pneumoperitoneum. Vascular/Lymphatic: No abdominal or pelvic lymphadenopathy, by size criteria. No aneurysmal dilation of the major abdominal arteries. Reproductive: The uterus is unremarkable. No large adnexal mass. Other: Infraumbilical midline surgical scar noted. There is heterogeneous calcification with extension into the peritoneal space, which may represent combination of calcified hernia mesh with fat necrosis. This is likely the culprit/source of adhesions with small bowel loops leading to small-bowel obstruction. Musculoskeletal: No suspicious osseous lesions. IMPRESSION: *Findings, as described in detail above suggest small bowel obstruction with point of transition in the midline lower abdomen. At this point, there are probable adhesions between the small bowel loops and dystrophic calcification from prior hernia repair. There also changes of coexistent  enteritis. No abscess, collection, pneumatosis, pneumoperitoneum or portal venous gas. *Multiple other nonacute observations, as described above. Electronically Signed   By: Jules Schick M.D.   On: 11/18/2022 14:14      Subjective: Patient seen and examined at bedside.  Feels much better and feels very bloated.  Tolerating diet.  No fever or vomiting reported currently.  Discharge Exam: Vitals:   11/20/22 0534 11/20/22 0811  BP: (!) 177/78 (!) 144/78  Pulse: 62 72  Resp: 18 17  Temp: 98 F (36.7 C) 98.7 F (37.1 C)  SpO2: 97% 100%    General: Pt is alert, awake, not in acute distress.  Currently on room air. Cardiovascular: rate controlled, S1/S2 + Respiratory: bilateral decreased breath sounds at bases Abdominal: Soft, NT, ND, bowel sounds + Extremities: no edema, no cyanosis    The results of significant diagnostics from this hospitalization (including imaging, microbiology, ancillary and laboratory) are listed below for reference.     Microbiology: Recent Results (from the past 240 hour(s))  Gastrointestinal Panel by PCR , Stool     Status: None   Collection Time: 11/18/22  3:02 PM   Specimen: Stool  Result Value Ref Range Status   Campylobacter species NOT DETECTED NOT DETECTED Final   Plesimonas shigelloides NOT DETECTED NOT DETECTED Final   Salmonella species NOT DETECTED NOT DETECTED Final   Yersinia enterocolitica NOT DETECTED NOT DETECTED Final   Vibrio species NOT DETECTED NOT  DETECTED Final   Vibrio cholerae NOT DETECTED NOT DETECTED Final   Enteroaggregative E coli (EAEC) NOT DETECTED NOT DETECTED Final   Enteropathogenic E coli (EPEC) NOT DETECTED NOT DETECTED Final   Enterotoxigenic E coli (ETEC) NOT DETECTED NOT DETECTED Final   Shiga like toxin producing E coli (STEC) NOT DETECTED NOT DETECTED Final   Shigella/Enteroinvasive E coli (EIEC) NOT DETECTED NOT DETECTED Final   Cryptosporidium NOT DETECTED NOT DETECTED Final   Cyclospora cayetanensis NOT  DETECTED NOT DETECTED Final   Entamoeba histolytica NOT DETECTED NOT DETECTED Final   Giardia lamblia NOT DETECTED NOT DETECTED Final   Adenovirus F40/41 NOT DETECTED NOT DETECTED Final   Astrovirus NOT DETECTED NOT DETECTED Final   Norovirus GI/GII NOT DETECTED NOT DETECTED Final   Rotavirus A NOT DETECTED NOT DETECTED Final   Sapovirus (I, II, IV, and V) NOT DETECTED NOT DETECTED Final    Comment: Performed at [facility], [address] [place], [place] [address]  C Difficile Quick Screen w PCR reflex     Status: None   Collection Time: 11/18/22  3:02 PM   Specimen: Stool  Result Value Ref Range Status   C Diff antigen NEGATIVE NEGATIVE Final   C Diff toxin NEGATIVE NEGATIVE Final   C Diff interpretation No C. difficile detected.  Final    Comment: Performed at [facility], [address], [place], [place] [address]     Labs: BNP (last 3 results) No results for input(s): "BNP" in the last 8760 hours. Basic Metabolic Panel: Recent Labs  Lab 11/18/22 0803 11/19/22 0842 11/20/22 0706  NA 136 140 139  K 3.6 3.6 4.0  CL 98 103 101  CO2 24 29 30   GLUCOSE 398* 227* 257*  BUN 14 7 7   CREATININE 1.06* 0.78 0.75  CALCIUM 9.6 8.9 8.9  MG  --   --  2.0   Liver Function Tests: Recent Labs  Lab 11/18/22 0803 11/20/22 0706  AST 23 23  ALT 22 22  ALKPHOS 118 91  BILITOT 0.7 0.4  PROT 8.2* 6.8  ALBUMIN 3.8 3.0*   Recent Labs  Lab 11/18/22 0803  LIPASE 30   No results for input(s): "AMMONIA" in the last 168 hours. CBC: Recent Labs  Lab 11/18/22 0803 11/20/22 0706  WBC 11.7* 6.6  NEUTROABS  --  3.2  HGB 14.3 11.9*  HCT 45.0 38.3  MCV 82.1 81.5  PLT 262 217   Cardiac Enzymes: No results for input(s): "CKTOTAL", "CKMB", "CKMBINDEX", "TROPONINI" in the last 168 hours. BNP: Invalid input(s): "POCBNP" CBG: Recent Labs  Lab 11/19/22 0834 11/19/22 1210 11/19/22 1721 11/19/22 2133 11/20/22 0810  GLUCAP 208* 257* 212* 177* 264*   D-Dimer No  results for input(s): "DDIMER" in the last 72 hours. Hgb A1c Recent Labs    11/18/22 0803  HGBA1C 13.9*   Lipid Profile No results for input(s): "CHOL", "HDL", "LDLCALC", "TRIG", "CHOLHDL", "LDLDIRECT" in the last 72 hours. Thyroid function studies No results for input(s): "TSH", "T4TOTAL", "T3FREE", "THYROIDAB" in the last 72 hours.  Invalid input(s): "FREET3" Anemia work up No results for input(s): "VITAMINB12", "FOLATE", "FERRITIN", "TIBC", "IRON", "RETICCTPCT" in the last 72 hours. Urinalysis    Component Value Date/Time   COLORURINE YELLOW 11/18/2022 1115   APPEARANCEUR HAZY (A) 11/18/2022 1115   LABSPEC 1.033 (H) 11/18/2022 1115   PHURINE 5.0 11/18/2022 1115   GLUCOSEU >=500 (A) 11/18/2022 1115   HGBUR NEGATIVE 11/18/2022 1115   BILIRUBINUR NEGATIVE 11/18/2022 1115  KETONESUR NEGATIVE 11/18/2022 1115   PROTEINUR NEGATIVE 11/18/2022 1115   UROBILINOGEN 0.2 08/30/2013 0152   NITRITE POSITIVE (A) 11/18/2022 1115   LEUKOCYTESUR MODERATE (A) 11/18/2022 1115   Sepsis Labs Recent Labs  Lab 11/18/22 0803 11/20/22 0706  WBC 11.7* 6.6   Microbiology Recent Results (from the past 240 hour(s))  Gastrointestinal Panel by PCR , Stool     Status: None   Collection Time: 11/18/22  3:02 PM   Specimen: Stool  Result Value Ref Range Status   Campylobacter species NOT DETECTED NOT DETECTED Final   Plesimonas shigelloides NOT DETECTED NOT DETECTED Final   Salmonella species NOT DETECTED NOT DETECTED Final   Yersinia enterocolitica NOT DETECTED NOT DETECTED Final   Vibrio species NOT DETECTED NOT DETECTED Final   Vibrio cholerae NOT DETECTED NOT DETECTED Final   Enteroaggregative E coli (EAEC) NOT DETECTED NOT DETECTED Final   Enteropathogenic E coli (EPEC) NOT DETECTED NOT DETECTED Final   Enterotoxigenic E coli (ETEC) NOT DETECTED NOT DETECTED Final   Shiga like toxin producing E coli (STEC) NOT DETECTED NOT DETECTED Final   Shigella/Enteroinvasive E coli (EIEC) NOT DETECTED  NOT DETECTED Final   Cryptosporidium NOT DETECTED NOT DETECTED Final   Cyclospora cayetanensis NOT DETECTED NOT DETECTED Final   Entamoeba histolytica NOT DETECTED NOT DETECTED Final   Giardia lamblia NOT DETECTED NOT DETECTED Final   Adenovirus F40/41 NOT DETECTED NOT DETECTED Final   Astrovirus NOT DETECTED NOT DETECTED Final   Norovirus GI/GII NOT DETECTED NOT DETECTED Final   Rotavirus A NOT DETECTED NOT DETECTED Final   Sapovirus (I, II, IV, and V) NOT DETECTED NOT DETECTED Final    Comment: Performed at [facility], [address] [place], [place] [address]  C Difficile Quick Screen w PCR reflex     Status: None   Collection Time: 11/18/22  3:02 PM   Specimen: Stool  Result Value Ref Range Status   C Diff antigen NEGATIVE NEGATIVE Final   C Diff toxin NEGATIVE NEGATIVE Final   C Diff interpretation No C. difficile detected.  Final    Comment: Performed at [facility], [address] [place], [place] [address]     Time coordinating discharge: 35 minutes  SIGNED:   Glade Lloyd, MD  [facility] 11/20/2022, 10:12 AM

## 2022-11-20 NOTE — Plan of Care (Signed)
Reviewed all information pertinent for discharge in AVS

## 2022-11-21 LAB — URINE CULTURE: Culture: >=100000 — AB

## 2022-11-25 ENCOUNTER — Telehealth: Payer: Self-pay

## 2022-11-25 NOTE — Transitions of Care (Post Inpatient/ED Visit) (Signed)
   11/25/2022  Name: [name] MRN: [id number] DOB: 02-14-1970  Today's TOC FU Call Status:    Attempted to reach the patient regarding the most recent Inpatient/ED visit.  Follow Up Plan: Additional outreach attempts will be made to reach the patient to complete the Transitions of Care (Post Inpatient/ED visit) call.   Jodelle Gross RN, BSN, CCM RN Care Manager  Transitions of Care  [facility]  [phone]

## 2022-11-26 ENCOUNTER — Telehealth: Payer: Self-pay | Admitting: *Deleted

## 2022-11-26 NOTE — Transitions of Care (Post Inpatient/ED Visit) (Signed)
11/26/2022  Name: [name] MRN: [id number] DOB: 1970-05-12  Today's TOC FU Call Status: Today's TOC FU Call Status:: Successful TOC FU Call Completed (pt placed on TOC list on 11/25/22,  TOC is outside window due to this) TOC FU Call Complete Date: 11/26/22 Patient's Name and Date of Birth confirmed.  Transition Care Management Follow-up Telephone Call Date of Discharge: 11/20/22 Discharge Facility: [facility]) Type of Discharge: Inpatient Admission Primary Inpatient Discharge Diagnosis:: Small Bowel Obstruction How have you been since you were released from the hospital?:  (pt reports she is doing well, back at work, appetite good, eating small meals, having regular bowel movements) Any questions or concerns?: No  Items Reviewed: Did you receive and understand the discharge instructions provided?: Yes Medications obtained,verified, and reconciled?: Yes (Medications Reviewed) Any new allergies since your discharge?: No Dietary orders reviewed?: Yes Type of Diet Ordered:: heart healthy, low sodium Do you have support at home?: Yes People in Home: child(ren), adult Name of Support/Comfort Primary Source: daughter [name] 52 years old)   Goals Addressed             This Visit's Progress    Transitions of Care/ pt will have no readmissions within 30 days       Current Barriers:  Provider appointments patient reports she is able and will call today to scheduled post hospital follow up appointment Knowledge Deficits related to plan of care for management of DMII  Patient reports she is feeling better since hospitalization for SBO, adult daughter [name] lives with pt and can assist if needed, pt states she is back at work, pt is concerned that [facility] is 13.9 as this affects her overall health Patient has worked with pharmacist and nutritionist this year Patient reports she eats a lot of rice Patient is agreeable to be enrolled in 30 day TOC program   RNCM Clinical  Goal(s):  Patient will verbalize basic understanding of  DMII disease process and self health management plan as evidenced by patient report, review of EMR, decrease in [facility] take all medications exactly as prescribed and will call provider for medication related questions as evidenced by patient report, review of EMR, refill history attend all scheduled medical appointments: patient to call 11/21 and scheduled post hospital follow up with primary care provider as evidenced by patient report, review of EMR and  through collaboration with RN Care manager, provider, and care team.   Interventions: Evaluation of current treatment plan related to  self management and patient's adherence to plan as established by provider   Diabetes Interventions:  (Status:  New goal. and Goal on track:  Yes.) Short Term Goal Assessed patient's understanding of A1c goal: <7% Provided education to patient about basic DM disease process Reviewed medications with patient and discussed importance of medication adherence Counseled on importance of regular laboratory monitoring as prescribed Discussed plans with patient for ongoing care management follow up and provided patient with direct contact information for care management team Review of patient status, including review of consultants reports, relevant laboratory and other test results, and medications completed Assessed social determinant of health barriers Lab Results  Component Value Date   HGBA1C 13.9 (H) 11/18/2022    Patient Goals/Self-Care Activities: Participate in Transition of Care Program/Attend [facility] scheduled calls Notify RN Care Manager of TOC call rescheduling needs Take all medications as prescribed Attend all scheduled provider appointments Call pharmacy for medication refills 3-7 days in advance of running out of medications Perform IADL's (shopping, preparing  meals, housekeeping, managing finances) independently Call provider office for new  concerns or questions  check blood sugar at prescribed times: per doctor's order check feet daily for cuts, sores or redness enter blood sugar readings and medication or insulin into daily log take the blood sugar log to all doctor visits trim toenails straight across fill half of plate with vegetables wash and dry feet carefully every day Please watch carbohydrate intake at each meal, too many carbohydrates will elevate blood sugar, decrease rice consumption Try to walk daily Please call today and schedule your post hospital follow up appointment with primary care provider  Follow Up Plan:  Telephone follow up appointment with care management team member scheduled for:  12/02/22 at 10 am           Medications Reviewed Today: Medications Reviewed Today     Reviewed by Audrie Gallus, RN (Registered Nurse) on 11/26/22 at 1351  Med List Status: <None>   Medication Order Taking? Sig Documenting Provider Last Dose Status Informant  Accu-Chek Softclix Lancets lancets [id number] Yes Use as instructed Quincy Simmonds, MD Taking Active   acetaminophen (TYLENOL) 500 MG tablet [id number] Yes Take 500 mg by mouth every 6 (six) hours as needed for moderate pain or headache. [provider] Taking Active Self           Med Note Waldemar Dickens Nov 18, 2022  2:25 PM)    alum & mag hydroxide-simeth (MAALOX/MYLANTA) 200-200-20 MG/5ML suspension [id number] Yes Take 15 mLs by mouth every 6 (six) hours as needed for indigestion or heartburn. Glade Lloyd, MD Taking Active   Blood Glucose Monitoring Suppl (ACCU-CHEK GUIDE) w/Device KIT [id number] Yes Use to check blood sugar once daily. Quincy Simmonds, MD Taking Active   Empagliflozin-metFORMIN HCl [facility]) 12.05-998 MG TABS [id number] Yes Take 1 tablet by mouth 2 (two) times daily. Quincy Simmonds, MD Taking Active   glucose blood (ACCU-CHEK GUIDE) test strip [id number] Yes Use as directed to check blood sugar once daily. Quincy Simmonds, MD  Taking Active   Insulin Degludec-Liraglutide Tula Nakayama) 100-3.6 UNIT-MG/ML SOPN [id number] Yes Please Inject 20 units into the skin once daily this week, then increase to 24 units into the skin daily next week and stay on that dose.  Patient taking differently: Inject 20 Units into the skin at bedtime.   Quincy Simmonds, MD Taking Active   Insulin Pen Needle (PEN NEEDLES) 32G X 4 MM MISC [id number] Yes use as directed Quincy Simmonds, MD Taking Active   losartan (COZAAR) 100 MG tablet [id number] Yes Take 1 tablet (100 mg total) by mouth daily. Marrianne Mood, MD Taking Active   methocarbamol (ROBAXIN) 500 MG tablet [id number] Yes Take 1 tablet (500 mg total) by mouth every 6 (six) hours as needed for muscle spasms. Glade Lloyd, MD Taking Active   metoprolol succinate (TOPROL-XL) 25 MG 24 hr tablet [id number] Yes Take 3 tablets (75 mg total) by mouth daily. Quincy Simmonds, MD Taking Active   nitroGLYCERIN (NITROSTAT) 0.4 MG SL tablet [id number] Yes Place 1 tablet (0.4 mg total) under the tongue every 5 (five) minutes as needed for chest pain. Glade Lloyd, MD Taking Active   pantoprazole (PROTONIX) 40 MG tablet [id number] Yes Take 1 tablet (40 mg total) by mouth daily. Glade Lloyd, MD Taking Active   polyethylene glycol (MIRALAX) 17 g packet [id number] Yes Dissolve 17 grams (1 packet) in 4-8 ounces of fluid and take by mouth daily as needed for mild constipation or moderate constipation. Glade Lloyd, MD  Taking Active   rivaroxaban (XARELTO) 20 MG TABS tablet [id number] Yes Take 1 tablet (20 mg total) by mouth daily with supper. Quincy Simmonds, MD Taking Active   Med List Note Vela Prose 05/12/19 2057): Patient requested that [facility] be left as her preferred pharmacy- regardless of it's hours of operation            Home Care and Equipment/Supplies: Were Home Health Services Ordered?: No Any new equipment or medical supplies ordered?: No  Functional Questionnaire: Do you  need assistance with bathing/showering or dressing?: No Do you need assistance with meal preparation?: No Do you need assistance with eating?: No Do you have difficulty maintaining continence: No Do you need assistance with getting out of bed/getting out of a chair/moving?: No Do you have difficulty managing or taking your medications?: No  Follow up appointments reviewed: PCP Follow-up appointment confirmed?: No (pt states she is able and will call today to scheduled post hospital follow up appointment) MD Provider Line Number:[phone] Given: No Specialist Hospital Follow-up appointment confirmed?: No Reason Specialist Follow-Up Not Confirmed:  (pt states she is not following up with specialist) Do you need transportation to your follow-up appointment?: No Do you understand care options if your condition(s) worsen?: Yes-patient verbalized understanding  SDOH Interventions Today    Flowsheet Row Most Recent Value  SDOH Interventions   Food Insecurity Interventions Intervention Not Indicated  Housing Interventions Intervention Not Indicated  Transportation Interventions Intervention Not Indicated  Utilities Interventions Intervention Not Indicated       Irving Shows [facility], BSN RN Care Manager/ Transition of Care [facility] [phone]

## 2022-12-02 ENCOUNTER — Other Ambulatory Visit (HOSPITAL_COMMUNITY): Payer: Self-pay

## 2022-12-02 ENCOUNTER — Other Ambulatory Visit: Payer: Self-pay

## 2022-12-02 NOTE — Patient Instructions (Signed)
Visit Information  Thank you for taking time to visit with me today. Please don't hesitate to contact me if I can be of assistance to you before our next scheduled telephone appointment.  Following are the goals we discussed today:  (Copy and paste patient goals from clinical care plan here)  Our next appointment is by telephone on 12/10/22 at 1:00PM.  Please call the care guide team at [phone] if you need to cancel or reschedule your appointment.   If you are experiencing a Mental Health or Behavioral Health Crisis or need someone to talk to, please call the Suicide and Crisis Lifeline: 988 call the [place] National Suicide Prevention Lifeline: 812-267-0304 or TTY: (762) 721-7849 TTY (205)450-3731) to talk to a trained counselor   The patient verbalized understanding of instructions, educational materials, and care plan provided today and DECLINED offer to receive copy of patient instructions, educational materials, and care plan.   The patient has been provided with contact information for the care management team and has been advised to call with any health related questions or concerns.   Jodelle Gross RN, BSN, CCM RN Care Manager  Transitions of Care  [facility] - [facility]  [phone]

## 2022-12-02 NOTE — Patient Outreach (Signed)
Care Management  Transitions of Care Program Transitions of Care Post-discharge week 2   12/02/2022 Name: [name] MRN: [id number] DOB: 1970/01/18  Subjective: [name] is a 52 y.o. year old female who is a primary care patient of Marrianne Mood, MD. The Care Management team Engaged with patient Engaged with patient by telephone to assess and address transitions of care needs.   Consent to Services:  Patient was given information about care management services, agreed to services, and gave verbal consent to participate.  Assessment: Successful outreach to patient this morning.  She shared that she attempted to call [facility] to make a follow up appointment and no one answered the phone.  Offered to have to clinic call her to schedule and appointment but she declined as she works full time and prefers to call herself.  Patient agreed to ask for an appointment with Norm Parcel, RD, Diabetic Educator when she makes appointment with PCP.  Discussed patient eating habits and patient loves rice.  Patient educated about alternatives to rice such as cauliflower rice which could even be mixed with regular rice to bring down amount of carbohydrates in her meals.     SDOH Interventions    Flowsheet Row Patient Outreach from 12/02/2022 in [place] POPULATION HEALTH DEPARTMENT Telephone from 11/26/2022 in [place] POPULATION HEALTH DEPARTMENT Office Visit from 12/18/2021 in [facility] [place]  SDOH Interventions     Food Insecurity Interventions Intervention Not Indicated Intervention Not Indicated Intervention Not Indicated  Housing Interventions Intervention Not Indicated Intervention Not Indicated Intervention Not Indicated  Transportation Interventions Intervention Not Indicated Intervention Not Indicated Intervention Not Indicated  Utilities Interventions Intervention Not Indicated Intervention Not Indicated Intervention Not  Indicated        Goals Addressed             This Visit's Progress    Transitions of Care 30 day program       Current Barriers:  Provider appointments patient reports she has been unable to connect with [facility] clinic to schedule follow up.   She plans to try again and also book appt with Norm Parcel, RD Knowledge Deficits related to plan of care for management of DMII  Patient reports she is feeling better since hospitalization for SBO, adult daughter [name] lives with pt and can assist if needed, pt states she is back at work, pt is concerned that [facility] is 13.9 as this affects her overall health Patient reports she eats a lot of rice, discussed idea of cauliflower rice being mixed in with her rice to lower carbohydrates. Patient is agreeable to be enrolled in 30 day TOC program   RNCM Clinical Goal(s):  Patient will verbalize basic understanding of  DMII disease process and self health management plan as evidenced by patient report, review of EMR, decrease in [facility] take all medications exactly as prescribed and will call provider for medication related questions as evidenced by patient report, review of EMR, refill history attend all scheduled medical appointments: patient to call 11/21 and scheduled post hospital follow up with primary care provider as evidenced by patient report, review of EMR and  through collaboration with RN Care manager, provider, and care team.  Interventions: Evaluation of current treatment plan related to  self management and patient's adherence to plan as established by provider Diabetes Interventions:  (Status:  New goal. and Goal on track:  Yes.) Short Term Goal Assessed patient's understanding  of A1c goal: <7% Provided education to patient about basic DM disease process Reviewed medications with patient and discussed importance of medication adherence Counseled on importance of regular laboratory monitoring as prescribed Discussed plans with patient for  ongoing care management follow up and provided patient with direct contact information for care management team Review of patient status, including review of consultants reports, relevant laboratory and other test results, and medications completed Assessed social determinant of health barriers Lab Results  Component Value Date   HGBA1C 13.9 (H) 11/18/2022    Patient Goals/Self-Care Activities: Participate in Transition of Care Program/Attend [facility] scheduled calls Notify RN Care Manager of TOC call rescheduling needs Take all medications as prescribed Attend all scheduled provider appointments Call pharmacy for medication refills 3-7 days in advance of running out of medications Perform IADL's (shopping, preparing meals, housekeeping, managing finances) independently Call provider office for new concerns or questions  check blood sugar at prescribed times: per doctor's order check feet daily for cuts, sores or redness enter blood sugar readings and medication or insulin into daily log take the blood sugar log to all doctor visits trim toenails straight across fill half of plate with vegetables wash and dry feet carefully every day Please watch carbohydrate intake at each meal, too many carbohydrates will elevate blood sugar, decrease rice consumption or mix with vegetable rice. Try to walk daily Please call today and schedule your post hospital follow up appointment with primary care provider  Follow Up Plan:  Telephone follow up appointment with care management team member scheduled for:  12/10/22 at 1:00 PM          Plan: The patient has been provided with contact information for the care management team and has been advised to call with any health related questions or concerns.   Jodelle Gross RN, BSN, CCM RN Care Manager  Transitions of Care  [facility] - [facility]  [phone]

## 2022-12-10 ENCOUNTER — Other Ambulatory Visit: Payer: Self-pay

## 2022-12-10 ENCOUNTER — Telehealth: Payer: Self-pay

## 2022-12-10 NOTE — Patient Outreach (Signed)
  Care Management  Transitions of Care Program Transitions of Care Post-discharge week 3  12/10/2022 Name: [name] MRN: [id number] DOB: 1970-06-03  Subjective: [name] is a 52 y.o. year old female who is a primary care patient of Marrianne Mood, MD. The Care Management team was unable to reach the patient by phone to assess and address transitions of care needs.   Plan: Additional outreach attempts will be made to reach the patient enrolled in the [facility] Program (Post Inpatient/ED Visit).  Jodelle Gross RN, BSN, CCM RN Care Manager  Transitions of Care  [facility] - [facility]  [phone]

## 2022-12-11 ENCOUNTER — Telehealth: Payer: Self-pay

## 2022-12-11 NOTE — Patient Outreach (Signed)
  Care Management  Transitions of Care Program Transitions of Care Post-discharge week 3  12/11/2022 Name: [name] MRN: [id number] DOB: Dec 03, 1970  Subjective: [name] is a 52 y.o. year old female who is a primary care patient of Marrianne Mood, MD. The Care Management team was unable to reach the patient by phone to assess and address transitions of care needs.   Plan: Additional outreach attempts will be made to reach the patient enrolled in the [facility] Program (Post Inpatient/ED Visit).  Jodelle Gross RN, BSN, CCM RN Care Manager  Transitions of Care  [facility] - [facility]  [phone]

## 2022-12-14 ENCOUNTER — Telehealth: Payer: Self-pay

## 2022-12-14 NOTE — Patient Outreach (Signed)
  Care Management  Transitions of Care Program Transitions of Care Post-discharge week 3  12/14/2022 Name: [name] MRN: [id number] DOB: Mar 31, 1970  Subjective: [name] is a 52 y.o. year old female who is a primary care patient of Marrianne Mood, MD. The Care Management team was unable to reach the patient by phone to assess and address transitions of care needs.   Plan: No further outreach attempts will be made at this time.  We have been unable to reach the patient.  Jodelle Gross RN, BSN, CCM RN Care Manager  Transitions of Care  [facility] - [facility]  [phone]

## 2023-02-16 ENCOUNTER — Other Ambulatory Visit (HOSPITAL_COMMUNITY): Payer: Self-pay

## 2023-02-16 ENCOUNTER — Encounter: Payer: Self-pay | Admitting: Student

## 2023-02-16 ENCOUNTER — Other Ambulatory Visit: Payer: Self-pay | Admitting: Student

## 2023-02-17 ENCOUNTER — Other Ambulatory Visit (HOSPITAL_COMMUNITY): Payer: Self-pay

## 2023-02-17 ENCOUNTER — Encounter (HOSPITAL_COMMUNITY): Payer: Self-pay

## 2023-02-23 ENCOUNTER — Ambulatory Visit: Payer: Self-pay | Admitting: Internal Medicine

## 2023-02-23 ENCOUNTER — Encounter (HOSPITAL_COMMUNITY): Payer: Self-pay

## 2023-02-23 ENCOUNTER — Other Ambulatory Visit (HOSPITAL_COMMUNITY): Payer: Self-pay

## 2023-02-23 ENCOUNTER — Other Ambulatory Visit: Payer: Self-pay

## 2023-02-23 VITALS — BP 166/85 | HR 78 | Temp 98.2°F | Ht 66.0 in | Wt 176.1 lb

## 2023-02-23 DIAGNOSIS — Z7984 Long term (current) use of oral hypoglycemic drugs: Secondary | ICD-10-CM | POA: Diagnosis not present

## 2023-02-23 DIAGNOSIS — I10 Essential (primary) hypertension: Secondary | ICD-10-CM | POA: Diagnosis not present

## 2023-02-23 DIAGNOSIS — E1169 Type 2 diabetes mellitus with other specified complication: Secondary | ICD-10-CM | POA: Diagnosis not present

## 2023-02-23 DIAGNOSIS — I480 Paroxysmal atrial fibrillation: Secondary | ICD-10-CM

## 2023-02-23 DIAGNOSIS — Z1231 Encounter for screening mammogram for malignant neoplasm of breast: Secondary | ICD-10-CM

## 2023-02-23 DIAGNOSIS — E119 Type 2 diabetes mellitus without complications: Secondary | ICD-10-CM

## 2023-02-23 DIAGNOSIS — Z0000 Encounter for general adult medical examination without abnormal findings: Secondary | ICD-10-CM

## 2023-02-23 LAB — GLUCOSE, CAPILLARY: Glucose-Capillary: 446 mg/dL — ABNORMAL HIGH (ref 70–99)

## 2023-02-23 LAB — POCT GLYCOSYLATED HEMOGLOBIN (HGB A1C): Hemoglobin A1C: 13.8 % — AB (ref 4.0–5.6)

## 2023-02-23 MED ORDER — DEXCOM G7 SENSOR MISC
1.0000 | 1 refills | Status: DC
Start: 2023-02-23 — End: 2023-07-20
  Filled 2023-02-23 – 2023-02-25 (×2): qty 3, 30d supply, fill #0
  Filled 2023-03-25: qty 3, 42d supply, fill #0
  Filled 2023-04-28 – 2023-06-10 (×2): qty 3, 30d supply, fill #0

## 2023-02-23 MED ORDER — LOSARTAN POTASSIUM 100 MG PO TABS
50.0000 mg | ORAL_TABLET | Freq: Every day | ORAL | 3 refills | Status: DC
  Filled 2023-02-23: qty 30, 60d supply, fill #0

## 2023-02-23 MED ORDER — PEN NEEDLES 32G X 4 MM MISC
1.0000 | Freq: Every day | 12 refills | Status: AC
  Filled 2023-02-23: qty 100, 90d supply, fill #0

## 2023-02-23 MED ORDER — DEXCOM G7 RECEIVER DEVI
1.0000 | 0 refills | Status: DC
  Filled 2023-02-23 – 2023-02-25 (×2): qty 1, 90d supply, fill #0

## 2023-02-23 MED ORDER — SYNJARDY 12.5-1000 MG PO TABS
1.0000 | ORAL_TABLET | Freq: Two times a day (BID) | ORAL | 11 refills | Status: DC
  Filled 2023-02-23: qty 60, 30d supply, fill #0

## 2023-02-23 MED ORDER — AMLODIPINE BESYLATE 5 MG PO TABS
5.0000 mg | ORAL_TABLET | Freq: Every day | ORAL | 11 refills | Status: DC
  Filled 2023-02-23: qty 30, 30d supply, fill #0

## 2023-02-23 MED ORDER — INSULIN DEGLUDEC-LIRAGLUTIDE 100-3.6 UNIT-MG/ML ~~LOC~~ SOPN
24.0000 [IU] | PEN_INJECTOR | Freq: Every day | SUBCUTANEOUS | 3 refills | Status: DC
  Filled 2023-02-23: qty 6, 25d supply, fill #0

## 2023-02-23 NOTE — Assessment & Plan Note (Signed)
The patient states that she is taking Xarelto for her A-fib, but I advised her to hold her Xarelto if she is not taking her antihypertensives, due to risk of brain bleed.  If she is taking her home antihypertensives, she was advised to take her Xarelto.

## 2023-02-23 NOTE — Assessment & Plan Note (Signed)
 Mammogram ordered

## 2023-02-23 NOTE — Assessment & Plan Note (Addendum)
Patient presents today to follow-up on her hypertension.  Blood pressure today was elevated to 209/97 and 166/85 upon recheck.  She is not sure what medication she takes, but states that she takes 3 small 25 mg tablets (I presume this is her metoprolol 75 mg).  She states that she has been out of her losartan for a while, and does not recall ever taking verapamil or having clonidine patches.  She denies any headaches, dizziness, vision changes, chest pain, or shortness of breath.  Plan: - Continue metoprolol 75 mg daily (also for A-fib) - Restart losartan 50 mg daily - Start amlodipine 5 mg daily - Follow-up in 1 week

## 2023-02-23 NOTE — Assessment & Plan Note (Addendum)
Patient presents for follow-up of her type 2 diabetes.  Her A1c was 13.9% and today it is 13.8%.  She takes Xultophy 20 units daily, but states that she does not check her blood sugar regularly.  She also takes Synjardy 12.05-998 mg twice daily.  Plan: - Increase Xultophy to 24 units nightly  - Continue Synjardy 12.05-998 mg twice daily - Follow-up in 1 week for further titration of insulin - Dexcom sensor and receiver ordered -Urine micro today

## 2023-02-23 NOTE — Patient Instructions (Signed)
Thank you, [name] for allowing [place] to provide your care today. Today we discussed:  High blood pressure START losartan 50 mg daily (1/2 a tablet) Keep taking metoprolol 75 mg daily (3 tablets) START amlodipine 5 mg daily Do not take your xarelto unless you are also taking your blood pressure medicines   Diabetes Increase your xultophy to 24 units every night Keep taking metformin/synjardy twice a day Follow up in 1 week  I have ordered the following labs for you:   Lab Orders         Glucose, capillary         POC Hbg A1C       Referrals ordered today:   Referral Orders  No referral(s) requested today     I have ordered the following medication/changed the following medications:   Stop the following medications: Medications Discontinued During This Encounter  Medication Reason   methocarbamol (ROBAXIN) 500 MG tablet    Insulin Pen Needle (PEN NEEDLES) 32G X 4 MM MISC Reorder   Insulin Degludec-Liraglutide (XULTOPHY) 100-3.6 UNIT-MG/ML SOPN Reorder   Empagliflozin-metFORMIN HCl (SYNJARDY) 12.05-998 MG TABS Reorder   losartan (COZAAR) 100 MG tablet Reorder     Start the following medications: Meds ordered this encounter  Medications   Continuous Glucose Sensor (DEXCOM G7 SENSOR) MISC    Sig: 1 each by Does not apply route every 14 (fourteen) days.    Dispense:  3 each    Refill:  1   Continuous Glucose Receiver (DEXCOM G7 RECEIVER) DEVI    Sig: 1 each by Does not apply route as directed.    Dispense:  1 each    Refill:  0   Insulin Degludec-Liraglutide (XULTOPHY) 100-3.6 UNIT-MG/ML SOPN    Sig: Inject 24 Units into the skin at bedtime.    Dispense:  3 mL    Refill:  3   Empagliflozin-metFORMIN HCl (SYNJARDY) 12.05-998 MG TABS    Sig: Take 1 tablet by mouth 2 (two) times daily.    Dispense:  60 tablet    Refill:  11   losartan (COZAAR) 100 MG tablet    Sig: Take 0.5 tablets (50 mg total) by mouth daily.    Dispense:  30 tablet    Refill:  3    Insulin Pen Needle (PEN NEEDLES) 32G X 4 MM MISC    Sig: use as directed    Dispense:  100 each    Refill:  12     Follow up:  1 week     Should you have any questions or concerns please call the internal medicine clinic at [phone].     Elza Rafter, D.O. [facility]

## 2023-02-23 NOTE — Assessment & Plan Note (Signed)
>>  ASSESSMENT AND PLAN FOR ESSENTIAL HYPERTENSION WRITTEN ON 02/23/2023  5:58 PM BY ATWAY, RAYANN N, DO  Patient presents today to follow-up on her hypertension.  Blood pressure today was elevated to 209/97 and 166/85 upon recheck.  She is not sure what medication she takes, but states that she takes 3 small 25 mg tablets (I presume this is her metoprolol  75 mg).  She states that she has been out of her losartan  for a while, and does not recall ever taking verapamil  or having clonidine  patches.  She denies any headaches, dizziness, vision changes, chest pain, or shortness of breath.  Plan: - Continue metoprolol  75 mg daily (also for A-fib) - Restart losartan  50 mg daily - Start amlodipine  5 mg daily - Follow-up in 1 week

## 2023-02-23 NOTE — Progress Notes (Signed)
CC: HTN and DM  HPI:  [name] is a 53 y.o. female living with a history stated below and presents today for a follow up of her HTN and DM. Please see problem based assessment and plan for additional details.  Past Medical History:  Diagnosis Date   Bell's palsy 03/2017   Gallstones    Gestational diabetes mellitus (GDM) 2005; 2006   Headache    "monthly" (03/12/2017)   History of chest pain    History of prolonged Q-T interval on ECG    a. 09/2012 adm: QTC 506.   Hypertension    PAF (paroxysmal atrial fibrillation) (HCC) 2012   a. 07/2011 ED visit with AF-> converted with flecainide, never followed-up;  b. 05/2012 Recurrent Afib -echo: EF 65-60%, mildly dil LA. Placed on Xarelto/daily Flecainide. c. 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   Small bowel obstruction (HCC) 09/2010   resolved   Type II diabetes mellitus (HCC)    a. 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 b. 05/2012 A1c 12.6.    Current Outpatient Medications on File Prior to Visit  Medication Sig Dispense Refill   Accu-Chek Softclix Lancets lancets Use as instructed 100 each 12   acetaminophen (TYLENOL) 500 MG tablet Take 500 mg by mouth every 6 (six) hours as needed for moderate pain or headache.     alum & mag hydroxide-simeth (MAALOX/MYLANTA) 200-200-20 MG/5ML suspension Take 15 mLs by mouth every 6 (six) hours as needed for indigestion or heartburn. 355 mL 0   Blood Glucose Monitoring Suppl (ACCU-CHEK GUIDE) w/Device KIT Use to check blood sugar once daily. 1 kit 1   glucose blood (ACCU-CHEK GUIDE) test strip Use as directed to check blood sugar once daily. 100 each 3   metoprolol succinate (TOPROL-XL) 25 MG 24 hr tablet Take 3 tablets (75 mg total) by mouth daily. 90 tablet 3   nitroGLYCERIN (NITROSTAT) 0.4 MG SL tablet Place 1 tablet (0.4 mg total) under the tongue every 5 (five) minutes as needed for chest pain. 25 tablet 0   pantoprazole (PROTONIX) 40 MG tablet Take 1 tablet  (40 mg total) by mouth daily. 30 tablet 0   polyethylene glycol (MIRALAX) 17 g packet Dissolve 17 grams (1 packet) in 4-8 ounces of fluid and take by mouth daily as needed for mild constipation or moderate constipation. 14 each 0   rivaroxaban (XARELTO) 20 MG TABS tablet Take 1 tablet (20 mg total) by mouth daily with supper. 90 tablet 3   No current facility-administered medications on file prior to visit.    Family History  Problem Relation Age of Onset   Other Father        killed in [place] civil war when he was in his 41's   Asthma Mother        alive in her 49's   Other Sister        A & W   Other Sister        A & W   Heart disease Neg Hx     Social History   Socioeconomic History   Marital status: Divorced    Spouse name: Not on file   Number of children: 2   Years of education: Not on file   Highest education level: Not on file  Occupational History   Occupation: HOUSEKEEPING    Employer: [employer]  Tobacco Use   Smoking status: Never   Smokeless tobacco: Never  Vaping Use  Vaping status: Never Used  Substance and Sexual Activity   Alcohol use: Never   Drug use: Never   Sexual activity: Not Currently    Partners: Male  Other Topics Concern   Not on file  Social History Narrative   ** Merged History Encounter **       ** Data from: 11/07/10 Enc Dept: [facility]   Pt has 6 sisters but doe not know their health history or status.      Mother and father unknown      Pt is an immigrant from [place], lives in [place], [place] by herself           ** Data from: 09/19/13 [facility]   Lives in [place] with significant other.  Works @ [employer] in [place].  Does not routinely exercise.   Social Drivers of Corporate investment banker Strain: Not on file  Food Insecurity: No Food Insecurity (12/02/2022)   Hunger Vital Sign    Worried About Running Out of Food in the Last Year: Never true    Ran Out of Food in the Last  Year: Never true  Transportation Needs: No Transportation Needs (12/02/2022)   PRAPARE - Administrator, Civil Service (Medical): No    Lack of Transportation (Non-Medical): No  Physical Activity: Not on file  Stress: Not on file  Social Connections: Moderately Integrated (12/11/2021)   Social Connection and Isolation Panel [NHANES]    Frequency of Communication with Friends and Family: More than three times a week    Frequency of Social Gatherings with Friends and Family: More than three times a week    Attends Religious Services: 1 to 4 times per year    Active Member of [employer] or Organizations: No    Attends Banker Meetings: Never    Marital Status: Married  Catering manager Violence: Not At Risk (12/02/2022)   Humiliation, Afraid, Rape, and Kick questionnaire    Fear of Current or Ex-Partner: No    Emotionally Abused: No    Physically Abused: No    Sexually Abused: No    Review of Systems: ROS negative except for what is noted on the assessment and plan.  Vitals:   02/23/23 1508 02/23/23 1551  BP: (!) 209/97 (!) 166/85  Pulse: 78 78  Temp: 98.2 F (36.8 C)   TempSrc: Oral   SpO2: 97%   Weight: 176 lb 1.6 oz (79.9 kg)   Height: 5\' 6"  (1.676 m)     Physical Exam: Constitutional: appears well Cardiovascular: regular rate and rhythm, systolic murmur appreciated Pulmonary/Chest: normal work of breathing on room air MSK: normal bulk and tone Skin: warm and dry Psych: normal mood and behavior  Assessment & Plan:    Patient discussed with Dr. Oswaldo Done  Essential hypertension Patient presents today to follow-up on her hypertension.  Blood pressure today was elevated to 209/97 and 166/85 upon recheck.  She is not sure what medication she takes, but states that she takes 3 small 25 mg tablets (I presume this is her metoprolol 75 mg).  She states that she has been out of her losartan for a while, and does not recall ever taking verapamil or having  clonidine patches.  She denies any headaches, dizziness, vision changes, chest pain, or shortness of breath.  Plan: - Continue metoprolol 75 mg daily (also for A-fib) - Restart losartan 50 mg daily - Start amlodipine 5 mg daily - Follow-up in 1 week  Atrial fibrillation [facility]) The patient states that she is taking Xarelto for her A-fib, but I advised her to hold her Xarelto if she is not taking her antihypertensives, due to risk of brain bleed.  If she is taking her home antihypertensives, she was advised to take her Xarelto.  Type 2 diabetes mellitus with other specified complication [facility]) Patient presents for follow-up of her type 2 diabetes.  Her A1c was 13.9% and today it is 13.8%.  She takes Xultophy 20 units daily, but states that she does not check her blood sugar regularly.  She also takes Synjardy 12.05-998 mg twice daily.  Plan: - Increase Xultophy to 24 units nightly  - Continue Synjardy 12.05-998 mg twice daily - Follow-up in 1 week for further titration of insulin - Dexcom sensor and receiver ordered -Urine micro today  Healthcare maintenance Mammogram ordered   Darelle Kings, D.O. [facility] Internal Medicine, PGY-3 Phone: [phone] Date 02/23/2023 Time 5:59 PM

## 2023-02-24 ENCOUNTER — Encounter: Payer: Self-pay | Admitting: Student

## 2023-02-24 ENCOUNTER — Other Ambulatory Visit: Payer: Self-pay

## 2023-02-24 ENCOUNTER — Other Ambulatory Visit (HOSPITAL_COMMUNITY): Payer: Self-pay

## 2023-02-24 LAB — MICROALBUMIN / CREATININE URINE RATIO
Creatinine, Urine: 25 mg/dL
Microalb/Creat Ratio: 103 mg/g{creat} — ABNORMAL HIGH (ref 0–29)
Microalbumin, Urine: 25.8 ug/mL

## 2023-02-24 NOTE — Progress Notes (Signed)
 Internal Medicine Clinic Attending  Case discussed with the resident physician at the time of the visit.  We reviewed the patient's history, exam, and pertinent patient test results.  I agree with the assessment, diagnosis, and plan of care documented in the resident's note.

## 2023-02-25 ENCOUNTER — Telehealth: Payer: Self-pay

## 2023-02-25 ENCOUNTER — Other Ambulatory Visit (HOSPITAL_COMMUNITY): Payer: Self-pay

## 2023-02-25 ENCOUNTER — Other Ambulatory Visit: Payer: Self-pay

## 2023-02-25 NOTE — Telephone Encounter (Signed)
[name] (Key: [id number]) Rx #: [id number] Need Help? Call us at [phone] Outcome Additional Information Required Your PA has been resolved, no additional PA is required. For further inquiries please contact the number on the back of the member prescription card. (Message [phone]) Drug Dexcom G7 Receiver device ePA cloud Psychologist, educational Electronic PA Form [phone] NCPDP)

## 2023-02-25 NOTE — Telephone Encounter (Signed)
Prior Authorization for patient [facility] G7 Receiver device) came through on cover my meds was submitted awaiting approval or denial.  ZOX:[id number])

## 2023-02-25 NOTE — Telephone Encounter (Signed)
[name] (KeyArnette Schaumann) PA Case ID #: [id number] Rx #: [id number] Need Help? Call us at [phone] Outcome Approved today by [facility] NCPDP 2017 Your PA request has been approved. Additional information will be provided in the approval communication. (Message [phone]) Effective Date: 02/24/2023 Authorization Expiration Date: 02/24/2024 Drug Dexcom G7 Sensor ePA cloud logo Form [employer] Electronic PA Form [phone] NCPDP)

## 2023-02-25 NOTE — Progress Notes (Signed)
Urine micro/cr ratio elevated to 103, up from 41 one year ago. Patient was just re-started on losartan 50 mg daily, would increase to 100 mg at office visit next week.

## 2023-02-25 NOTE — Telephone Encounter (Signed)
Prior Authorization for patient (Dexcom G7 Sensor) came through on cover my meds was submitted with last office notes and labs awaiting approval or denial.  WUJ:[id number]

## 2023-03-03 ENCOUNTER — Other Ambulatory Visit (HOSPITAL_COMMUNITY): Payer: Self-pay

## 2023-03-03 ENCOUNTER — Ambulatory Visit (INDEPENDENT_AMBULATORY_CARE_PROVIDER_SITE_OTHER): Payer: 59 | Admitting: Internal Medicine

## 2023-03-03 VITALS — BP 165/89 | HR 77 | Temp 98.1°F | Ht 66.0 in | Wt 173.4 lb

## 2023-03-03 DIAGNOSIS — Z794 Long term (current) use of insulin: Secondary | ICD-10-CM

## 2023-03-03 DIAGNOSIS — E1169 Type 2 diabetes mellitus with other specified complication: Secondary | ICD-10-CM | POA: Diagnosis not present

## 2023-03-03 DIAGNOSIS — I10 Essential (primary) hypertension: Secondary | ICD-10-CM | POA: Diagnosis not present

## 2023-03-03 NOTE — Patient Instructions (Addendum)
 Thank you, [name] for allowing [place] to provide your care today. Today we discussed:  High blood pressure Keep taking metoprolol 75 mg daily (3 tablets) Keep taking amlodipine 5 mg daily Increase your losartan to 100 mg daily (full tablet) Please re-start your xarelto   Diabetes Keep taking Synjardy twice a day Get your Dexcom from your pharmacy  Increase your Xultophy to 26 units daily - if you have any shakiness/sweating/dizziness/lightheadedness/heart racing, this could be a sign that your blood sugar is too low so be sure to keep a sugary snack with you and then reduce the dose back to 24 units daily    I have ordered the following labs for you:  Lab Orders  No laboratory test(s) ordered today      Referrals ordered today:    Referral Orders         Ambulatory referral to Ophthalmology      I have ordered the following medication/changed the following medications:   Stop the following medications: There are no discontinued medications.   Start the following medications: No orders of the defined types were placed in this encounter.    Follow up:  3 weeks      Should you have any questions or concerns please call the internal medicine clinic at [phone].     Elza Rafter, D.O. [facility]

## 2023-03-03 NOTE — Progress Notes (Signed)
 CC: 1 week follow up  HPI:  [name] is a 53 y.o. female living with a history stated below and presents today for a 1 week follow-up of her diabetes and hypertension. Please see problem based assessment and plan for additional details.  Past Medical History:  Diagnosis Date   Bell's palsy 03/2017   Gallstones    Gestational diabetes mellitus (GDM) 2005; 2006   Headache    "monthly" (03/12/2017)   History of chest pain    History of prolonged Q-T interval on ECG    a. 09/2012 adm: QTC 506.   Hypertension    PAF (paroxysmal atrial fibrillation) (HCC) 2012   a. 07/2011 ED visit with AF-> converted with flecainide, never followed-up;  b. 05/2012 Recurrent Afib -echo: EF 65-60%, mildly dil LA. Placed on Xarelto/daily Flecainide. c. 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   Small bowel obstruction (HCC) 09/2010   resolved   Type II diabetes mellitus (HCC)    a. 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 b. 05/2012 A1c 12.6.    Current Outpatient Medications on File Prior to Visit  Medication Sig Dispense Refill   Accu-Chek Softclix Lancets lancets Use as instructed 100 each 12   acetaminophen (TYLENOL) 500 MG tablet Take 500 mg by mouth every 6 (six) hours as needed for moderate pain or headache.     alum & mag hydroxide-simeth (MAALOX/MYLANTA) 200-200-20 MG/5ML suspension Take 15 mLs by mouth every 6 (six) hours as needed for indigestion or heartburn. 355 mL 0   amLODipine (NORVASC) 5 MG tablet Take 1 tablet (5 mg total) by mouth daily. 30 tablet 11   Blood Glucose Monitoring Suppl (ACCU-CHEK GUIDE) w/Device KIT Use to check blood sugar once daily. 1 kit 1   Continuous Glucose Receiver (DEXCOM G7 RECEIVER) DEVI Use as directed. 1 each 0   Continuous Glucose Sensor (DEXCOM G7 SENSOR) MISC Change sensor every 10 days. 3 each 1   Empagliflozin-metFORMIN HCl (SYNJARDY) 12.05-998 MG TABS Take 1 tablet by mouth 2 (two) times daily. 60 tablet 11   glucose  blood (ACCU-CHEK GUIDE) test strip Use as directed to check blood sugar once daily. 100 each 3   Insulin Degludec-Liraglutide (XULTOPHY) 100-3.6 UNIT-MG/ML SOPN Inject 24 Units into the skin at bedtime. 6 mL 3   Insulin Pen Needle (PEN NEEDLES) 32G X 4 MM MISC use as directed 100 each 12   losartan (COZAAR) 100 MG tablet Take 1/2 tablet (50 mg total) by mouth daily. 30 tablet 3   metoprolol succinate (TOPROL-XL) 25 MG 24 hr tablet Take 3 tablets (75 mg total) by mouth daily. 90 tablet 3   nitroGLYCERIN (NITROSTAT) 0.4 MG SL tablet Place 1 tablet (0.4 mg total) under the tongue every 5 (five) minutes as needed for chest pain. 25 tablet 0   pantoprazole (PROTONIX) 40 MG tablet Take 1 tablet (40 mg total) by mouth daily. 30 tablet 0   polyethylene glycol (MIRALAX) 17 g packet Dissolve 17 grams (1 packet) in 4-8 ounces of fluid and take by mouth daily as needed for mild constipation or moderate constipation. 14 each 0   rivaroxaban (XARELTO) 20 MG TABS tablet Take 1 tablet (20 mg total) by mouth daily with supper. 90 tablet 3   No current facility-administered medications on file prior to visit.    Family History  Problem Relation Age of Onset   Other Father        killed in [place] civil war when  he was in his 77's   Asthma Mother        alive in her 67's   Other Sister        A & W   Other Sister        A & W   Heart disease Neg Hx     Social History   Socioeconomic History   Marital status: Divorced    Spouse name: Not on file   Number of children: 2   Years of education: Not on file   Highest education level: Not on file  Occupational History   Occupation: HOUSEKEEPING    Employer: [employer]  Tobacco Use   Smoking status: Never   Smokeless tobacco: Never  Vaping Use   Vaping status: Never Used  Substance and Sexual Activity   Alcohol use: Never   Drug use: Never   Sexual activity: Not Currently    Partners: Male  Other Topics Concern   Not on file  Social History  Narrative   ** Merged History Encounter **       ** Data from: 11/07/10 Enc Dept: [facility]   Pt has 6 sisters but doe not know their health history or status.      Mother and father unknown      Pt is an immigrant from [place], lives in [place], [place] by herself           ** Data from: 09/19/13 Enc [facility]   Lives in [place] with significant other.  Works @ [employer] in [place].  Does not routinely exercise.   Social Drivers of Corporate investment banker Strain: Not on file  Food Insecurity: No Food Insecurity (12/02/2022)   Hunger Vital Sign    Worried About Running Out of Food in the Last Year: Never true    Ran Out of Food in the Last Year: Never true  Transportation Needs: No Transportation Needs (12/02/2022)   PRAPARE - Administrator, Civil Service (Medical): No    Lack of Transportation (Non-Medical): No  Physical Activity: Not on file  Stress: Not on file  Social Connections: Moderately Integrated (12/11/2021)   Social Connection and Isolation Panel [NHANES]    Frequency of Communication with Friends and Family: More than three times a week    Frequency of Social Gatherings with Friends and Family: More than three times a week    Attends Religious Services: 1 to 4 times per year    Active Member of [employer] or Organizations: No    Attends Banker Meetings: Never    Marital Status: Married  Catering manager Violence: Not At Risk (12/02/2022)   Humiliation, Afraid, Rape, and Kick questionnaire    Fear of Current or Ex-Partner: No    Emotionally Abused: No    Physically Abused: No    Sexually Abused: No    Review of Systems: ROS negative except for what is noted on the assessment and plan.  Vitals:   03/03/23 1431 03/03/23 1446  BP: (!) 177/84 (!) 165/89  Pulse: 80 77  Temp: 98.1 F (36.7 C)   TempSrc: Oral   SpO2: 100%   Weight: 173 lb 6.4 oz (78.7 kg)   Height: 5\' 6"  (1.676 m)     Physical  Exam: Constitutional: appears well, NAD Cardiovascular: regular rate and rhythm, systolic murmur  Pulmonary/Chest: normal work of breathing on room air, lungs clear to auscultation bilaterally MSK: normal bulk and tone Skin: warm  and dry Psych: normal mood and behavior  Assessment & Plan:    Patient discussed with Dr. Cleda Daub  Essential hypertension The patient presents to the [facility] for 1 week follow-up of her uncontrolled hypertension.  At the last office visit her systolic was elevated to 209, and she stated that she was not taking her blood pressure medicines, aside from metoprolol 75 mg daily.  I started her on losartan 50 mg daily and amlodipine 5 mg daily, and today, her blood pressure is improved to 177/84 and 165/89 upon recheck.  She denies any headaches, dizziness, blurry vision, chest pain, or shortness of breath.    Plan: - Increase losartan to 100 mg daily - Continue amlodipine 5 mg daily - Continue metoprolol 75 mg daily-follow-up in 3 weeks for blood pressure recheck and BMP  Type 2 diabetes mellitus with other specified complication (HCC) A1c elevated to 13.9% 1 week ago.  Her Xultophy was increased from 20 units daily to 24 units daily.  She does not check her blood sugars regularly, but denies any signs or symptoms of hypoglycemia.  Plan: - Increase Xultophy to 26 units daily - Continue Synjardy 12.05-998 mg twice daily - Follow-up in ~ 3 weeks for further titration of insulin + meet with Lupita Leash for help with Dexcom placement -Referral to ophthalmology   Quame Spratlin, D.O. [facility] Internal Medicine, PGY-3 Phone: [phone] Date 03/03/2023 Time 3:15 PM

## 2023-03-03 NOTE — Assessment & Plan Note (Signed)
 A1c elevated to 13.9% 1 week ago.  Her Xultophy was increased from 20 units daily to 24 units daily.  She does not check her blood sugars regularly, but denies any signs or symptoms of hypoglycemia.  Plan: - Increase Xultophy to 26 units daily - Continue Synjardy 12.05-998 mg twice daily - Follow-up in ~ 3 weeks for further titration of insulin + meet with [name] for help with Dexcom placement -Referral to ophthalmology

## 2023-03-03 NOTE — Assessment & Plan Note (Signed)
>>  ASSESSMENT AND PLAN FOR ESSENTIAL HYPERTENSION WRITTEN ON 03/03/2023  3:15 PM BY ATWAY, RAYANN N, DO  The patient presents to the [facility] for 1 week follow-up of her uncontrolled hypertension.  At the last office visit her systolic was elevated to 209, and she stated that she was not taking her antihypertensives, aside from metoprolol  75 mg daily.  I started her on losartan  50 mg daily and amlodipine  5 mg daily, and today, her blood pressure is improved to 177/84 and 165/89 upon recheck.  She denies any headaches, dizziness, blurry vision, chest pain, or shortness of breath.    Plan: - Increase losartan  to 100 mg daily - Continue amlodipine  5 mg daily - Continue metoprolol  75 mg daily-follow-up in 3 weeks for blood pressure recheck and BMP

## 2023-03-03 NOTE — Assessment & Plan Note (Addendum)
 The patient presents to the [facility] for 1 week follow-up of her uncontrolled hypertension.  At the last office visit her systolic was elevated to 209, and she stated that she was not taking her antihypertensives, aside from metoprolol 75 mg daily.  I started her on losartan 50 mg daily and amlodipine 5 mg daily, and today, her blood pressure is improved to 177/84 and 165/89 upon recheck.  She denies any headaches, dizziness, blurry vision, chest pain, or shortness of breath.    Plan: - Increase losartan to 100 mg daily - Continue amlodipine 5 mg daily - Continue metoprolol 75 mg daily-follow-up in 3 weeks for blood pressure recheck and BMP

## 2023-03-07 NOTE — Progress Notes (Signed)
 Internal Medicine Clinic Attending  Case discussed with the resident at the time of the visit.  We reviewed the resident's history and exam and pertinent patient test results.  I agree with the assessment, diagnosis, and plan of care documented in the resident's note.

## 2023-03-10 ENCOUNTER — Other Ambulatory Visit (HOSPITAL_COMMUNITY): Payer: Self-pay

## 2023-03-15 ENCOUNTER — Telehealth: Payer: Self-pay | Admitting: *Deleted

## 2023-03-15 NOTE — Telephone Encounter (Signed)
 Mammogram appointment 03-2023. [facility].

## 2023-03-25 ENCOUNTER — Ambulatory Visit: Payer: 59 | Admitting: Student

## 2023-03-25 ENCOUNTER — Ambulatory Visit (INDEPENDENT_AMBULATORY_CARE_PROVIDER_SITE_OTHER): Payer: 59 | Admitting: Dietician

## 2023-03-25 ENCOUNTER — Other Ambulatory Visit (HOSPITAL_COMMUNITY): Payer: Self-pay

## 2023-03-25 ENCOUNTER — Encounter: Payer: Self-pay | Admitting: Student

## 2023-03-25 ENCOUNTER — Ambulatory Visit
Admission: RE | Admit: 2023-03-25 | Discharge: 2023-03-25 | Disposition: A | Discharge status: Home / Self Care | Payer: 59 | Source: Ambulatory Visit | Attending: Student in an Organized Health Care Education/Training Program | Admitting: Student in an Organized Health Care Education/Training Program

## 2023-03-25 VITALS — BP 172/80 | HR 85 | Temp 98.0°F | Ht 66.0 in | Wt 171.7 lb

## 2023-03-25 DIAGNOSIS — E1169 Type 2 diabetes mellitus with other specified complication: Secondary | ICD-10-CM

## 2023-03-25 DIAGNOSIS — Z794 Long term (current) use of insulin: Secondary | ICD-10-CM

## 2023-03-25 DIAGNOSIS — I421 Obstructive hypertrophic cardiomyopathy: Secondary | ICD-10-CM

## 2023-03-25 DIAGNOSIS — Z1231 Encounter for screening mammogram for malignant neoplasm of breast: Secondary | ICD-10-CM

## 2023-03-25 DIAGNOSIS — I152 Hypertension secondary to endocrine disorders: Secondary | ICD-10-CM

## 2023-03-25 DIAGNOSIS — E2609 Other primary hyperaldosteronism: Secondary | ICD-10-CM | POA: Insufficient documentation

## 2023-03-25 DIAGNOSIS — I10 Essential (primary) hypertension: Secondary | ICD-10-CM

## 2023-03-25 DIAGNOSIS — Z7984 Long term (current) use of oral hypoglycemic drugs: Secondary | ICD-10-CM

## 2023-03-25 MED ORDER — SPIRONOLACTONE 25 MG PO TABS
25.0000 mg | ORAL_TABLET | Freq: Every day | ORAL | 3 refills | Status: DC
  Filled 2023-03-25: qty 30, 30d supply, fill #0

## 2023-03-25 NOTE — Patient Instructions (Addendum)
 I will start a medicine called spironolactone. This will help your blood pressure. Please take it daily.  Regarding your blood sugars, now that you have your monitor pay close attention to your blood sugar after you wake up. My goal is for morning "fasting" sugars to be less than 140. However, I don't want your sugar to ever go below 70 (if that happens, drink a sugary drink).  Today I will increase your xultophy to 28 units daily. In one week, I would like you to increase to 30 units daily. At that point, make a log of your morning sugars. Every week, you can increase your daily xultophy by 2 units until your morning sugars are consistenly at or below 140. Once most of your morning sugars are at or below 140, stop increasing the xultophy and keep the same daily dose.  You are due for a checkup with your cardiologist. Please call their office for an apopintment: Charlton Haws, MD  [facility] [address] [place], [place]  [address] Phone: [phone] Fax: [phone]

## 2023-03-25 NOTE — Progress Notes (Signed)
 CC: T2DM and HTN follow up  HPI:  [name] is a 53 y.o. female with a PMH stated below who presents today for follow up. She does not have acute complaints.  Please see problem based assessment and plan for additional details.  Past Medical History:  Diagnosis Date   Bell's palsy 03/2017   Gallstones    Gestational diabetes mellitus (GDM) 2005; 2006   Headache    "monthly" (03/12/2017)   History of chest pain    History of prolonged Q-T interval on ECG    a. 09/2012 adm: QTC 506.   Hypertension    PAF (paroxysmal atrial fibrillation) (HCC) 2012   a. 07/2011 ED visit with AF-> converted with flecainide, never followed-up;  b. 05/2012 Recurrent Afib -echo: EF 65-60%, mildly dil LA. Placed on Xarelto/daily Flecainide. c. 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   Small bowel obstruction (HCC) 09/2010   resolved   Type II diabetes mellitus (HCC)    a. 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 b. 05/2012 A1c 12.6.    Review of Systems: ROS negative except for what is noted on the assessment and plan.  Vitals:   03/25/23 1334 03/25/23 1440  BP: (!) 173/73 (!) 172/80  Pulse: 83 85  Temp: 98 F (36.7 C)   TempSrc: Oral   SpO2: 98%   Weight: 171 lb 11.2 oz (77.9 kg)   Height: 5\' 6"  (1.676 m)     Physical Exam: Constitutional: well-appearing woman in no acute distress Cardiovascular: regular rate and rhythm, known 3/6 systolic murmur. Pulmonary/Chest: normal work of breathing on room air, lungs clear to auscultation bilaterally Abdominal: soft, non-tender, non-distended. No bruit. MSK: normal bulk and tone Neurological: alert & oriented x 3, no focal deficit Skin: warm and dry Psych: normal mood and behavior  Assessment & Plan:   Patient discussed with Dr. Antony Contras  Type 2 diabetes mellitus with other specified complication (HCC) Today as a follow-up 3 weeks after titration of her diabetes medicines.  1 month ago her A1c was 13.9%.  She  takes insulin, liraglutide, empagliflozin, metformin as combined packaged medications.  Prior to today she was not consistently measuring her blood glucoses but today was provided with a CGM which is set up with a kind assistance of our diabetes educator and coordinator.  She does not report symptoms of hypoglycemia.  In office blood glucose is 250.  Will further increase her Xultophy. - Continue Synjardy 12.05-998 mg twice daily - Increase Xultophy from 26 units to 30 units daily over 2 weeks. Tonight she will take 28 nightly for one week and next Thursday increase to 30 units nightly.   - I have instructed her to monitor her morning blood sugars with a goal of fasting blood glucose below 140.  At length we discussed that she can self titrate Xultophy slowly to achieve this goal.  She can increase her nightly dose by 2 units with weekly titration intervals until fasting sugars are consistently at/below 140. Confirmed with teach back. 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  She was seen 3 weeks ago having been off her medicine with a systolic blood pressure above 629.  Today her blood pressure is 172/80 and she is adherent with her medicines.  On review, it appears she had renin aldosterone testing in the past.  Given the extremely elevated ratio of  68, I believe this is diagnostic for hyperaldosteronism.  Recent abdominal imaging did not reveal any concerns for an adenoma or other adrenal mass. No abdominal bruit on exam. Will start treatment with spironolactone. - Continue losartan 100 mg daily - Continue amlodipine 5 mg daily - Continue metoprolol 75 mg daily - Start spironolactone 25 mg daily - BMP today  HOCM (hypertrophic obstructive cardiomyopathy) (HCC) She was cardiology one year ago with instructions for a 1 year follow up. I do not see appointments  scheduled. I have asked her to follow up with them, contact information provided.  RTC in 4 weeks for BP and diabetes follow up with further titration of spironolactone, xultophy, and other medicines as indicated.  Repeat BMP at next visit.  Katheran James, D.O. [facility] Internal Medicine, PGY-1 Phone: [phone] Date 03/25/2023 Time 5:12 PM

## 2023-03-25 NOTE — Assessment & Plan Note (Addendum)
 Today as a follow-up 3 weeks after titration of her diabetes medicines.  1 month ago her A1c was 13.9%.  She takes insulin, liraglutide, empagliflozin, metformin as combined packaged medications.  Prior to today she was not consistently measuring her blood glucoses but today was provided with a CGM which is set up with a kind assistance of our diabetes educator and coordinator.  She does not report symptoms of hypoglycemia.  In office blood glucose is 250.  Will further increase her Xultophy. - Continue Synjardy 12.05-998 mg twice daily - Increase Xultophy from 26 units to 30 units daily over 2 weeks. Tonight she will take 28 nightly for one week and next Thursday increase to 30 units nightly.   - I have instructed her to monitor her morning blood sugars with a goal of fasting blood glucose below 140.  At length we discussed that she can self titrate Xultophy slowly to achieve this goal.  She can increase her nightly dose by 2 units with weekly titration intervals until fasting sugars are consistently at/below 140. Confirmed with teach back. - Precautions for hypoglycemia provided

## 2023-03-25 NOTE — Progress Notes (Signed)
 Diabetes Self-Management Education  Visit Type: First/Initial  Appt. Start Time: 1415 Appt. End Time: 1450  03/25/2023  [name], identified by name and date of birth, is a 53 y.o. female with a diagnosis of Diabetes: Type 2.   ASSESSMENT  [name] states that she is having a difficult time affording both a receiver and the sensors for CG<. Her phone is not compatible. She was given a same receiver and sensor today so she could monitoring her glucose.   Estimated body mass index is 27.71 kg/m as calculated from the following:   Height as of an earlier encounter on 03/25/23: 5\' 6"  (1.676 m).   Weight as of an earlier encounter on 03/25/23: 171 lb 11.2 oz (77.9 kg). Wt Readings from Last 10 Encounters:  03/03/23 173 lb 6.4 oz (78.7 kg)  02/23/23 176 lb 1.6 oz (79.9 kg)  11/18/22 180 lb (81.6 kg)  09/18/22 179 lb 0.2 oz (81.2 kg)  06/03/22 179 lb (81.2 kg)  06/03/22 179 lb 9.6 oz (81.5 kg)  05/20/22 177 lb 1.6 oz (80.3 kg)  02/02/22 174 lb 1.6 oz (79 kg)  12/18/21 177 lb 11.2 oz (80.6 kg)  12/11/21 176 lb 6.4 oz (80 kg)   Lab Results  Component Value Date   HGBA1C 13.8 (A) 02/23/2023   HGBA1C 13.9 (H) 11/18/2022   HGBA1C 13.1 (A) 05/20/2022   HGBA1C 14.0 (A) 12/11/2021   HGBA1C 11.1 (A) 05/31/2020       Diabetes Self-Management Education - 03/25/23 1400       Visit Information   Visit Type First/Initial      Initial Visit   Diabetes Type Type 2    Are you currently following a meal plan? Yes    What type of meal plan do you follow? lower starchy foods    Are you taking your medications as prescribed? Yes      Health Coping   How would you rate your overall health? Good      Psychosocial Assessment   Patient Belief/Attitude about Diabetes Motivated to manage diabetes    Self-care barriers Lack of material resources    Self-management support Doctor's office;CDE visits    Patient Concerns Glycemic Control    Special Needs None    Preferred Learning Style  Hands on    Learning Readiness Contemplating    How often do you need to have someone help you when you read instructions, pamphlets, or other written materials from your doctor or pharmacy? 3 - Sometimes    What is the last grade level you completed in school? 12      Pre-Education Assessment   Patient understands monitoring blood glucose, interpreting and using results Needs Instruction      Complications   Last HgB A1C per patient/outside source 13.8 %    How often do you check your blood sugar? 0 times/day (not testing)    Postprandial Blood glucose range (mg/dL) >854    Number of hypoglycemic episodes per month 0    Number of hyperglycemic episodes ( >200mg /dL): Daily    Can you tell when your blood sugar is high? No    Have you had a dilated eye exam in the past 12 months? No    Have you had a dental exam in the past 12 months? No    Are you checking your feet? Yes    How many days per week are you checking your feet? 7      Dietary Intake  Breakfast fruit, eggs, bread    Lunch beans, rice, meat, veggies    Dinner beans, rice and vegetables    Beverage(s) coke, juice, water      Activity / Exercise   Activity / Exercise Type ADL's;Light (walking / raking leaves)    How many days per week do you exercise? 5    How many minutes per day do you exercise? 120    Total minutes per week of exercise 600      Patient Education   Previous Diabetes Education Yes (please comment)   here, minimal   Monitoring Taught/evaluated CGM (comment)      Individualized Goals (developed by patient)   Monitoring  Consistenly use CGM      Post-Education Assessment   Patient understands monitoring blood glucose, interpreting and using results Needs Review      Outcomes   Expected Outcomes Demonstrated interest in learning but significant barriers to change    Future DMSE 2 wks    Program Status Not Completed             Individualized Plan for Diabetes Self-Management Training:    Learning Objective:  Patient will have a greater understanding of diabetes self-management. Patient education plan is to attend individual and/or group sessions per assessed needs and concerns.   Plan:   Patient Instructions  Thank you for your visit today!  You started a Dexcom G7 /cgm today after putting the sensor on your arm.  Please keep the receiver with you most times within 20 feet.   Charge the receiver every 3 days  Use the information to know what and how much to eat and how much to be physically active.   Please [pick up Dexcom G7 sensors to bring to the office if you want me to help you put a new one on.   Please make a follow up with me in 10 day to 2 weeks  [name] [phone]   Expected Outcomes:  Demonstrated interest in learning but significant barriers to change  Education material provided: Diabetes Resources  If problems or questions, patient to contact team via:  Phone  Future DSME appointment: 2 wks Norm Parcel, RD 03/25/2023 3:08 PM.

## 2023-03-25 NOTE — Assessment & Plan Note (Signed)
 She was cardiology one year ago with instructions for a 1 year follow up. I do not see appointments scheduled. I have asked her to follow up with them, contact information provided.

## 2023-03-25 NOTE — Assessment & Plan Note (Signed)
 She has a longstanding history of difficult to control hypertension.  She was seen 3 weeks ago having been off her medicine with a systolic blood pressure above 657.  Today her blood pressure is 172/80 and she is adherent with her medicines.  On review, it appears she had renin aldosterone testing in the past.  Given the extremely elevated ratio of 68, I believe this is diagnostic for hyperaldosteronism.  Recent abdominal imaging did not reveal any concerns for an adenoma or other adrenal mass. No abdominal bruit on exam. Will start treatment with spironolactone. - Continue losartan 100 mg daily - Continue amlodipine 5 mg daily - Continue metoprolol 75 mg daily - Start spironolactone 25 mg daily - BMP today

## 2023-03-25 NOTE — Patient Instructions (Signed)
 Thank you for your visit today!  You started a Dexcom G7 /cgm today after putting the sensor on your arm.  Please keep the receiver with you most times within 20 feet.   Charge the receiver every 3 days  Use the information to know what and how much to eat and how much to be physically active.   Please [pick up Dexcom G7 sensors to bring to the office if you want me to help you put a new one on.   Please make a follow up with me in 10 day to 2 weeks  Lupita Leash [phone]

## 2023-03-26 LAB — BMP8+ANION GAP
Anion Gap: 20 mmol/L — ABNORMAL HIGH (ref 10.0–18.0)
BUN/Creatinine Ratio: 17 (ref 9–23)
BUN: 18 mg/dL (ref 6–24)
CO2: 24 mmol/L (ref 20–29)
Calcium: 9.8 mg/dL (ref 8.7–10.2)
Chloride: 97 mmol/L (ref 96–106)
Creatinine, Ser: 1.08 mg/dL — ABNORMAL HIGH (ref 0.57–1.00)
Glucose: 237 mg/dL — ABNORMAL HIGH (ref 70–99)
Potassium: 3.8 mmol/L (ref 3.5–5.2)
Sodium: 141 mmol/L (ref 134–144)
eGFR: 62 mL/min/{1.73_m2} (ref >59)

## 2023-03-31 ENCOUNTER — Encounter: Payer: Self-pay | Admitting: Internal Medicine

## 2023-03-31 NOTE — Progress Notes (Signed)
 Sent My-Chart message

## 2023-04-03 NOTE — Progress Notes (Signed)
 Internal Medicine Clinic Attending  Case discussed with the resident at the time of the visit.  We reviewed the resident's history and exam and pertinent patient test results.  I agree with the assessment, diagnosis, and plan of care documented in the resident's note.

## 2023-04-22 ENCOUNTER — Encounter: Payer: Self-pay | Admitting: Dietician

## 2023-04-22 ENCOUNTER — Encounter: Admitting: Internal Medicine

## 2023-04-26 ENCOUNTER — Other Ambulatory Visit (HOSPITAL_COMMUNITY): Payer: Self-pay

## 2023-04-26 LAB — HM DIABETES EYE EXAM

## 2023-04-26 MED ORDER — LATANOPROST 0.005 % OP SOLN
1.0000 [drp] | Freq: Every day | OPHTHALMIC | 4 refills | Status: AC
Start: 2023-04-26 — End: ?
  Filled 2023-04-26 – 2023-07-07 (×2): qty 7.5, 75d supply, fill #0

## 2023-04-28 ENCOUNTER — Other Ambulatory Visit (HOSPITAL_COMMUNITY): Payer: Self-pay

## 2023-04-28 ENCOUNTER — Ambulatory Visit: Admitting: Dietician

## 2023-04-28 ENCOUNTER — Encounter: Admitting: Internal Medicine

## 2023-04-28 DIAGNOSIS — E1169 Type 2 diabetes mellitus with other specified complication: Secondary | ICD-10-CM

## 2023-04-28 DIAGNOSIS — Z794 Long term (current) use of insulin: Secondary | ICD-10-CM | POA: Diagnosis not present

## 2023-04-28 LAB — GLUCOSE, CAPILLARY: Glucose-Capillary: 281 mg/dL — ABNORMAL HIGH (ref 70–99)

## 2023-04-28 NOTE — Patient Instructions (Addendum)
 Thank you for your visit today.   Your blood sugar this morning after breakfast (scrambled eggs, half a pancake w/ sugar free syrup and  water) and  was high.  Please pick up Dexcom G7 sensors from your pharmacy.  Reschedule today's appointment and when you return please bring:   - a Dexcom G7 sensor 30$ (from pharmacy)    - Your Dexcom G7 receiver   - all of your medicines (eye drops 9$ at your pharmacy)  [name] [phone]

## 2023-04-28 NOTE — Progress Notes (Deleted)
 4 week f/u for BP, DM  DM Evaluated 1 month ago for further titration of diabetes regimen. HbA1c last checked ***. Current regimen is synjardy  12.05-998 mg BID, xultophy  30 units daily, ***. She has a CGM which shows over the last 14 days her average glucose has been ***, time above range ***%, in range ***%, time above range ***. She has not had any lows***. Plan:  HTN 2/2 hyperaldosteronism BP ***. Note elevated renin/aldosterone studies in the past revealed aldosterone 11, renin <0.167, and aldosterone/renin ratio of 68 in 2021; further evaluation was not pursued as there was apparently no spontaneous hypokalemia. Spironolactone  25 mg daily added to anti-hypertensive regimen 1 month ago, which consists additionally of losartan  100 mg daily, amlodipine  5 mg daily, and metoprolol  25 mg daily. BMP last checked ***.  Plan: Continue ***. I do feel that confirmatory testing for hyperaldosteronism is warranted and will pursue ***.

## 2023-04-28 NOTE — Progress Notes (Signed)
 Diabetes Self-Management Education  Visit Type: Follow-up (1st after initial)  Appt. Start Time: 815 Appt. End Time: 900  04/28/2023  [name], identified by name and date of birth, is a 53 y.o. female with a diagnosis of Diabetes:  .   ASSESSMENT CBG (last 3)  Recent Labs    04/28/23 0840  GLUCAP 281*      Diabetes Self-Management Education - 04/28/23 0900       Visit Information   Visit Type Follow-up   1st after initial     Health Coping   How would you rate your overall health? Good      Psychosocial Assessment   What is the hardest part about your diabetes right now, causing you the most concern, or is the most worrisome to you about your diabetes?   --   plan to assess at follow up     Pre-Education Assessment   Patient understands prevention, detection, and treatment of acute complications. Needs Review      Complications   Postprandial Blood glucose range (mg/dL) >952    Number of hypoglycemic episodes per month 0    Number of hyperglycemic episodes ( >200mg /dL): Daily    Can you tell when your blood sugar is high? Yes    What do you do if your blood sugar is high? nothing different   she says she does feel tired and she was not ware that she should drink more water when her blood sugar is high.   Have you had a dilated eye exam in the past 12 months? Yes      Dietary Intake   Breakfast eggs, half a pancake w sf syrup    Beverage(s) water      Patient Education   Previous Diabetes Education Yes (please comment)   here   Monitoring Purpose and frequency of SMBG.;Yearly dilated eye exam   explained how her medicines work; what target glucose should be both before and after eating and why CGM would help us  help her. she states she feels tired often with no energy. we discussed that this is likely being caused by her elevated blood sugar.   Acute complications Discussed and identified patients' prevention, symptoms, and treatment of hyperglycemia.       Individualized Goals (developed by patient)   Monitoring  Consistenly use CGM      Patient Self-Evaluation of Goals - Patient rates self as meeting previously set goals (% of time)   Monitoring < 25% (hardly ever/never)      Post-Education Assessment   Patient understands monitoring blood glucose, interpreting and using results Needs Review    Patient understands prevention, detection, and treatment of acute complications. Needs Review      Outcomes   Expected Outcomes Demonstrated interest in learning but significant barriers to change    Program Status Not Completed      Subsequent Visit   Since your last visit have you continued or begun to take your medications as prescribed? Yes   able to name diabetes medicines   Since your last visit have you had your blood pressure checked? No    Since your last visit have you experienced any weight changes? --   not assessed today   Since your last visit, are you checking your blood glucose at least once a day? No   has not used her meter or CGM            Individualized Plan for Diabetes Self-Management Training:  Learning Objective:  Patient will have a greater understanding of diabetes self-management. Patient education plan is to attend individual and/or group sessions per assessed needs and concerns.   Plan:   Patient Instructions  Thank you for your visit today.   Your blood sugar this morning after breakfast (scrambled eggs, half a pancake w/ sugar free syrup and  water) and  was high.  Please pick up Dexcom G7 sensors from your pharmacy.  Reschedule today's appointment and when you return please bring:   - a Dexcom G7 sensor 30$ (from pharmacy)    - Your Dexcom G7 receiver   - all of your medicines (eye drops 9$ at your pharmacy)  [name] [phone]    Expected Outcomes:  Demonstrated interest in learning but significant barriers to change  Education material provided: Diabetes Resources  If problems or  questions, patient to contact team via:  Phone  Future DSME appointment:  as soon and possible, she scheduled for June per front desk because she cannot keep taking off of work. Marlene Simas, RD 04/28/2023 1:49 PM.

## 2023-05-04 ENCOUNTER — Other Ambulatory Visit: Payer: Self-pay | Admitting: Internal Medicine

## 2023-05-04 ENCOUNTER — Other Ambulatory Visit (HOSPITAL_COMMUNITY): Payer: Self-pay

## 2023-05-04 DIAGNOSIS — E119 Type 2 diabetes mellitus without complications: Secondary | ICD-10-CM

## 2023-05-04 MED ORDER — INSULIN DEGLUDEC-LIRAGLUTIDE 100-3.6 UNIT-MG/ML ~~LOC~~ SOPN
27.0000 [IU] | PEN_INJECTOR | Freq: Every day | SUBCUTANEOUS | 3 refills | Status: DC
  Filled 2023-05-04: qty 6, 22d supply, fill #0

## 2023-05-04 NOTE — Progress Notes (Signed)
 Increase in Xultophy  made after discussion with patient and [name] given continued hyperglycemia. I will increase to 27 units daily. [name] is in agreement with this plan. She has a follow up scheduled with Dr. Esaw Heckler on 06/05.  Malen Scudder, DO

## 2023-05-06 ENCOUNTER — Other Ambulatory Visit (HOSPITAL_COMMUNITY): Payer: Self-pay

## 2023-05-07 ENCOUNTER — Other Ambulatory Visit (HOSPITAL_COMMUNITY): Payer: Self-pay

## 2023-05-10 ENCOUNTER — Encounter: Payer: Self-pay | Admitting: Student

## 2023-05-10 ENCOUNTER — Other Ambulatory Visit (HOSPITAL_COMMUNITY): Payer: Self-pay

## 2023-06-10 ENCOUNTER — Other Ambulatory Visit (HOSPITAL_COMMUNITY): Payer: Self-pay

## 2023-06-10 ENCOUNTER — Ambulatory Visit (INDEPENDENT_AMBULATORY_CARE_PROVIDER_SITE_OTHER): Admitting: Dietician

## 2023-06-10 ENCOUNTER — Ambulatory Visit: Admitting: Internal Medicine

## 2023-06-10 ENCOUNTER — Encounter: Payer: Self-pay | Admitting: Internal Medicine

## 2023-06-10 VITALS — Wt 170.0 lb

## 2023-06-10 VITALS — BP 173/93 | HR 75 | Temp 98.3°F | Ht 66.0 in | Wt 170.0 lb

## 2023-06-10 DIAGNOSIS — Z794 Long term (current) use of insulin: Secondary | ICD-10-CM

## 2023-06-10 DIAGNOSIS — M79651 Pain in right thigh: Secondary | ICD-10-CM

## 2023-06-10 DIAGNOSIS — I10 Essential (primary) hypertension: Secondary | ICD-10-CM

## 2023-06-10 DIAGNOSIS — E1169 Type 2 diabetes mellitus with other specified complication: Secondary | ICD-10-CM

## 2023-06-10 DIAGNOSIS — I152 Hypertension secondary to endocrine disorders: Secondary | ICD-10-CM

## 2023-06-10 DIAGNOSIS — Z7984 Long term (current) use of oral hypoglycemic drugs: Secondary | ICD-10-CM

## 2023-06-10 DIAGNOSIS — M79652 Pain in left thigh: Secondary | ICD-10-CM | POA: Diagnosis not present

## 2023-06-10 DIAGNOSIS — E119 Type 2 diabetes mellitus without complications: Secondary | ICD-10-CM

## 2023-06-10 DIAGNOSIS — E2609 Other primary hyperaldosteronism: Secondary | ICD-10-CM

## 2023-06-10 LAB — POCT GLYCOSYLATED HEMOGLOBIN (HGB A1C): Hemoglobin A1C: 12.5 % — AB (ref 4.0–5.6)

## 2023-06-10 LAB — GLUCOSE, CAPILLARY: Glucose-Capillary: 270 mg/dL — ABNORMAL HIGH (ref 70–99)

## 2023-06-10 MED ORDER — SPIRONOLACTONE 25 MG PO TABS
25.0000 mg | ORAL_TABLET | Freq: Every day | ORAL | 3 refills | Status: AC
  Filled 2023-06-10 – 2023-07-07 (×2): qty 30, 30d supply, fill #0

## 2023-06-10 MED ORDER — METOPROLOL SUCCINATE ER 25 MG PO TB24
75.0000 mg | ORAL_TABLET | Freq: Every day | ORAL | 3 refills | Status: AC
  Filled 2023-06-10 – 2023-07-07 (×2): qty 90, 30d supply, fill #0

## 2023-06-10 MED ORDER — AMLODIPINE BESYLATE 5 MG PO TABS
5.0000 mg | ORAL_TABLET | Freq: Every day | ORAL | 11 refills | Status: DC
  Filled 2023-06-10 – 2023-07-07 (×2): qty 30, 30d supply, fill #0

## 2023-06-10 MED ORDER — SYNJARDY 12.5-1000 MG PO TABS
1.0000 | ORAL_TABLET | Freq: Two times a day (BID) | ORAL | 11 refills | Status: AC
  Filled 2023-06-10 – 2023-07-07 (×2): qty 60, 30d supply, fill #0

## 2023-06-10 MED ORDER — NAPROXEN 500 MG PO TABS
500.0000 mg | ORAL_TABLET | Freq: Two times a day (BID) | ORAL | 0 refills | Status: DC
  Filled 2023-06-10 – 2023-07-07 (×2): qty 14, 7d supply, fill #0

## 2023-06-10 MED ORDER — INSULIN DEGLUDEC-LIRAGLUTIDE 100-3.6 UNIT-MG/ML ~~LOC~~ SOPN
31.0000 [IU] | PEN_INJECTOR | Freq: Every day | SUBCUTANEOUS | 3 refills | Status: AC
  Filled 2023-06-10 – 2023-07-07 (×2): qty 9, 29d supply, fill #0

## 2023-06-10 MED ORDER — LOSARTAN POTASSIUM 100 MG PO TABS
50.0000 mg | ORAL_TABLET | Freq: Every day | ORAL | 3 refills | Status: AC
  Filled 2023-06-10 – 2023-07-07 (×2): qty 30, 60d supply, fill #0

## 2023-06-10 NOTE — Assessment & Plan Note (Signed)
 BP is elevated to 187/94. She did not take any of her medications this morning. She has not been checking her blood pressure at home. Encouraged patient to take her medications the morning of her next appointment so that can be adjusted as needed.  - Continue current regimen of losartan  100mg , spironolactone  25mg , metoprolol  succinate 75mg , amlodipine  5mg 

## 2023-06-10 NOTE — Assessment & Plan Note (Signed)
 A1c today is 12.5 from 13.8 when last checked. She says she is adherent to her prescribed Xultophy  27 units daily and Synjardy . She did not bring her CGM receiver to review today. Denies symptomatic hypoglycemia or CGM alerts that her blood sugar is low - Increase Xultophy  to 31 units daily. Continue empagliflozin -metformin  12.5 BID - Follow up visit in approximately 6 weeks to interrogate CGM and adjust DM medication as needed

## 2023-06-10 NOTE — Assessment & Plan Note (Signed)
 Patient reports 3-4 days of upper leg pain. 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 Her legs are not tender to palpation, no visible changes or asymmetry. Denies any inciting trauma. No visible changes with her urine to point to muscle breakdown/rhabdo. I suspect she is having some musculoskeletal soreness.  - Naproxen  500mg  BID for 7 days prescribed

## 2023-06-10 NOTE — Progress Notes (Signed)
 Subjective:  CC: BP, DM f/u  HPI:  [name] is a 53 y.o. female with a past medical history stated below and presents today for above. Please see problem based assessment and plan for additional details.  Past Medical History:  Diagnosis Date   Bell's palsy 03/2017   Gallstones    Gestational diabetes mellitus (GDM) 2005; 2006   Headache    "monthly" (03/12/2017)   History of chest pain    History of prolonged Q-T interval on ECG    a. 09/2012 adm: QTC 506.   Hypertension    PAF (paroxysmal atrial fibrillation) (HCC) 2012   a. 07/2011 ED visit with AF-> converted with flecainide , never followed-up;  b. 05/2012 Recurrent Afib -echo: EF 65-60%, mildly dil LA. Placed on Xarelto /daily Flecainide . c. 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   Small bowel obstruction (HCC) 09/2010   resolved   Type II diabetes mellitus (HCC)    a. 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 b. 05/2012 A1c 12.6.    Current Outpatient Medications on File Prior to Visit  Medication Sig Dispense Refill   Accu-Chek Softclix Lancets lancets Use as instructed 100 each 12   acetaminophen  (TYLENOL ) 500 MG tablet Take 500 mg by mouth every 6 (six) hours as needed for moderate pain or headache.     alum & mag hydroxide-simeth (MAALOX/MYLANTA) 200-200-20 MG/5ML suspension Take 15 mLs by mouth every 6 (six) hours as needed for indigestion or heartburn. 355 mL 0   Blood Glucose Monitoring Suppl (ACCU-CHEK GUIDE) w/Device KIT Use to check blood sugar once daily. 1 kit 1   Continuous Glucose Receiver (DEXCOM G7 RECEIVER) DEVI Use as directed. 1 each 0   Continuous Glucose Sensor (DEXCOM G7 SENSOR) MISC Change sensor every 10 days. 3 each 1   glucose blood (ACCU-CHEK GUIDE) test strip Use as directed to check blood sugar once daily. 100 each 3   Insulin  Pen Needle (PEN NEEDLES) 32G X 4 MM MISC use as directed 100 each 12   latanoprost  (XALATAN ) 0.005 % ophthalmic solution Place 1 drop  into both eyes at bedtime. 7.5 mL 4   nitroGLYCERIN  (NITROSTAT ) 0.4 MG SL tablet Place 1 tablet (0.4 mg total) under the tongue every 5 (five) minutes as needed for chest pain. 25 tablet 0   pantoprazole  (PROTONIX ) 40 MG tablet Take 1 tablet (40 mg total) by mouth daily. 30 tablet 0   polyethylene glycol (MIRALAX ) 17 g packet Dissolve 17 grams (1 packet) in 4-8 ounces of fluid and take by mouth daily as needed for mild constipation or moderate constipation. 14 each 0   rivaroxaban  (XARELTO ) 20 MG TABS tablet Take 1 tablet (20 mg total) by mouth daily with supper. 90 tablet 3   No current facility-administered medications on file prior to visit.   Review of Systems: ROS negative except for as is noted on the assessment and plan.  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 She did not take any of her medications this morning. She has not been checking her blood pressure at home. Encouraged patient to take her medications the morning of her next appointment so that can be adjusted as needed.  - Continue current regimen of losartan  100mg , spironolactone  25mg , metoprolol  succinate 75mg , amlodipine  5mg   Type 2 diabetes mellitus with other specified complication (HCC) A1c today is 12.5 from 13.8 when last checked. She says she is adherent to her prescribed Xultophy  27 units daily and Synjardy . She did not bring her CGM receiver to review today. Denies symptomatic hypoglycemia or CGM alerts that her blood sugar is low - Increase Xultophy   to 31 units daily. Continue empagliflozin -metformin  12.5 BID - Follow up visit in approximately 6 weeks to interrogate CGM and adjust DM medication as needed  Bilateral thigh pain Patient reports 3-4 days of upper leg pain. 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 Her legs are not tender to palpation, no visible changes or asymmetry. Denies any inciting trauma. No visible changes with her urine to point to muscle breakdown/rhabdo. I suspect she is having some musculoskeletal soreness.  - Naproxen  500mg  BID for 7 days prescribed    Patient discussed with Dr. Gaylon Kea MD [facility] Internal Medicine  PGY-1 Pager: [phone] Date 06/10/2023  Time 1:35 PM

## 2023-06-10 NOTE — Progress Notes (Signed)
 Lab Results  Component Value Date   HGBA1C 12.5 (A) 06/10/2023   HGBA1C 13.8 (A) 02/23/2023   HGBA1C 13.9 (H) 11/18/2022   HGBA1C 13.1 (A) 05/20/2022   HGBA1C 14.0 (A) 12/11/2021    BP Readings from Last 3 Encounters:  06/10/23 (!) 187/94  03/25/23 (!) 172/80  03/03/23 (!) 165/89   Wt Readings from Last 10 Encounters:  06/10/23 170 lb (77.1 kg)  06/10/23 170 lb (77.1 kg)  03/25/23 171 lb 11.2 oz (77.9 kg)  03/03/23 173 lb 6.4 oz (78.7 kg)  02/23/23 176 lb 1.6 oz (79.9 kg)  11/18/22 180 lb (81.6 kg)  09/18/22 179 lb 0.2 oz (81.2 kg)  06/03/22 179 lb (81.2 kg)  06/03/22 179 lb 9.6 oz (81.5 kg)  05/20/22 177 lb 1.6 oz (80.3 kg)   Diabetes Self-Management Education  Visit Type: Follow-up (2nd after initial)  Appt. Start Time: 845 Appt. End Time: 915  06/10/2023  [name], identified by name and date of birth, is a 53 y.o. female with a diagnosis of Diabetes:  [name] Type 2  ASSESSMENT  Weight 170 lb (77.1 kg), last menstrual period 10/01/2006. Body mass index is 27.44 kg/m. Recommend increasing Xultophy , patient will not get full dose of liraglutide  until she reaches 50 units/day.   Discussed with Dr. Esaw Heckler today.   Diabetes Self-Management Education - 06/10/23 0800       Visit Information   Visit Type Follow-up   2nd after initial     Health Coping   How would you rate your overall health? Good      Psychosocial Assessment   What is the hardest part about your diabetes right now, causing you the most concern, or is the most worrisome to you about your diabetes?   Other (comment)   glycemic control is a concern to her: she spoke to her pastor about it and he suggested she begin wlaking more outside of her job. she plans to start walking with her duaghter this Friday     Pre-Education Assessment   Patient understands the diabetes disease and treatment process. Needs Review    Patient understands incorporating nutritional management into lifestyle. Needs Review     Patient understands using medications safely. Needs Review      Complications   Last HgB A1C per patient/outside source 12.5 %   it is decreasing, suggest further titration if insulin    Fasting Blood glucose range (mg/dL) >161    Have you had a dental exam in the past 12 months? No   has an appointment next week   Are you checking your feet? Yes   she reports no problems, she wears good shoes and socks   How many days per week are you checking your feet? 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 5    How many minutes per day do you exercise? 60    Total minutes per week of exercise 300      Patient Education  Previous Diabetes Education Yes (please comment)   here   Healthy Eating Plate Method   she asked if she needs to cut all starchy foods out including brown rice and whole wheat bread and we also discussed fruits portiosn and recommedned number of servigns per day.   Medications Reviewed patients medication for diabetes, action, purpose, timing of dose and side effects.   she has been taking synjardy  and xultophy  separately one at dinner and the other at bedtime, explained it woul dbe fine to take them at the same time and why   Monitoring Identified appropriate SMBG and/or A1C goals.;Yearly dilated eye exam    Chronic complications Dental care;Retinopathy and reason for yearly dilated eye exams;Relationship between chronic complications and blood glucose control      Individualized Goals (developed by patient)   Monitoring  Consistenly use CGM      Patient Self-Evaluation of Goals - Patient  rates self as meeting previously set goals (% of time)   Monitoring < 25% (hardly ever/never)      Post-Education Assessment   Patient understands incorporating nutritional management into lifestyle. Comprehends key points    Patient understands using medications safely. Comphrehends key points    Patient understands prevention, detection, and treatment of chronic complications. Comprehends key points      Outcomes   Expected Outcomes Demonstrated interest in learning but significant barriers to change    Future DMSE 4-6 wks    Program Status Not Completed      Subsequent Visit   Since your last visit have you continued or begun to take your medications as prescribed? Yes   states she fell aslepp last night and did not take her insulin , but has taken it all the other nights   Since your last visit have you had your blood pressure checked? Yes    Is your most recent blood pressure lower, unchanged, or higher since your last visit? Unchanged   staying elevated   Since your last visit have you experienced any weight changes? No change    Since your last visit, are you checking your blood glucose at least once a day? No   statesd she is interested in self monitoring, but has not checked her blood sugar, does not bring the reader givine to her and has not picked up the sensors at the pharmacy. phamracy says sensors are 30$ per month for 3 and they will be ready tomorrow            Individualized Plan for Diabetes Self-Management Training:   Learning Objective:  Patient will have a greater understanding of diabetes self-management. Patient education plan is to attend individual and/or group sessions per assessed needs and concerns.   Plan:   Patient Instructions  Bring the Dexcom Receiver to be able self monitor.  Pick up sensors from pharmacy.   You said you are going to start walking on Friday with your daughter  Congrats on going to the dentist and eye doctor.   Follow up has  been scheduled for July 2, at 915 AM  [name] [phone] (new number)   Expected Outcomes:  Demonstrated interest in learning but significant barriers to change  Education material provided: Diabetes Resources  If problems or questions, patient to contact team via:  Phone and Email  Future DSME appointment: 4-6 wks Marlene Simas, RD 06/10/2023 9:46 AM.

## 2023-06-10 NOTE — Patient Instructions (Addendum)
 Bring the [facility] Receiver to be able self monitor.  Pick up sensors from pharmacy.   You said you are going to start walking on Friday with your daughter  Congrats on going to the dentist and eye doctor.   Follow up has been scheduled for July 2, at 915 AM  [name] [phone] (new number)

## 2023-06-10 NOTE — Patient Instructions (Signed)
 [name],   For you diabetes, please increase your Xultophy  to 31 units nightly. We will plan to see you again in about 6 weeks to adjust your insulin  further if needed, so please make sure to bring your receiver at that time.   For your blood pressure, please keep taking your same medications, and make sure to take them the morning of your next appointment.   For your leg pain, I am prescribing a short course of a medication called naproxen . If your legs are not feeling better after this, we can keep talking about other options at your next appointment, or sooner if needed.   Thanks,  Dr Esaw Heckler

## 2023-06-11 ENCOUNTER — Other Ambulatory Visit (HOSPITAL_COMMUNITY): Payer: Self-pay

## 2023-06-11 NOTE — Progress Notes (Signed)
 Internal Medicine Clinic Attending  Case discussed with the resident at the time of the visit.  We reviewed the resident's history and exam and pertinent patient test results.  I agree with the assessment, diagnosis, and plan of care documented in the resident's note.

## 2023-06-15 ENCOUNTER — Encounter (HOSPITAL_COMMUNITY): Payer: Self-pay

## 2023-06-15 ENCOUNTER — Other Ambulatory Visit: Payer: Self-pay

## 2023-06-15 ENCOUNTER — Emergency Department (HOSPITAL_COMMUNITY)
Admission: EM | Admit: 2023-06-15 | Discharge: 2023-06-15 | Disposition: A | Discharge status: Home / Self Care | Attending: Emergency Medicine | Admitting: Emergency Medicine

## 2023-06-15 DIAGNOSIS — B349 Viral infection, unspecified: Secondary | ICD-10-CM | POA: Diagnosis not present

## 2023-06-15 DIAGNOSIS — Z794 Long term (current) use of insulin: Secondary | ICD-10-CM | POA: Diagnosis not present

## 2023-06-15 DIAGNOSIS — Z7901 Long term (current) use of anticoagulants: Secondary | ICD-10-CM | POA: Insufficient documentation

## 2023-06-15 LAB — RESP PANEL BY RT-PCR (RSV, FLU A&B, COVID)  RVPGX2
Influenza A by PCR: NEGATIVE
Influenza B by PCR: NEGATIVE
Resp Syncytial Virus by PCR: NEGATIVE
SARS Coronavirus 2 by RT PCR: NEGATIVE

## 2023-06-15 NOTE — ED Provider Notes (Signed)
 [place] EMERGENCY DEPARTMENT AT [facility] Provider Note   CSN: [id number] Arrival date & time: 06/15/23  1105     History  Chief Complaint  Patient presents with   URI    [name] is a 53 y.o. female.  53 year old female with no past medical history presents to the ED with a chief complaint of cold symptoms for the past 3 days.  She endorses a dry cough, sneezing, runny nose, body aches that have been ongoing for 3 days, she has been taking some Tylenol  without any improvement in symptoms.  She describes the cough as dry and nonproductive.  She also endorses pain along her lumbar spine, exacerbated with any type of relation along with effort.  No bowel or bladder complaints.  No fevers, no chest pain or shortness of breath.  The history is provided by the patient.  URI Presenting symptoms: cough   Presenting symptoms: no fever        Home Medications Prior to Admission medications   Medication Sig Start Date End Date Taking? Authorizing Provider  Accu-Chek Softclix Lancets lancets Use as instructed 12/18/21   Barnetta Liberty, MD  acetaminophen  (TYLENOL ) 500 MG tablet Take 500 mg by mouth every 6 (six) hours as needed for moderate pain or headache.    [provider]  alum & mag hydroxide-simeth (MAALOX/MYLANTA) 200-200-20 MG/5ML suspension Take 15 mLs by mouth every 6 (six) hours as needed for indigestion or heartburn. 11/20/22   Audria Leather, MD  amLODipine  (NORVASC ) 5 MG tablet Take 1 tablet (5 mg total) by mouth daily. 06/10/23 06/09/24  Jayson Michael, MD  Blood Glucose Monitoring Suppl (ACCU-CHEK GUIDE) w/Device KIT Use to check blood sugar once daily. 06/03/22   Barnetta Liberty, MD  Continuous Glucose Receiver (DEXCOM G7 RECEIVER) DEVI Use as directed. 02/23/23   Atway, Rayann N, DO  Continuous Glucose Sensor (DEXCOM G7 SENSOR) MISC Change sensor every 10 days. 02/23/23   Atway, Rayann N, DO  Empagliflozin -metFORMIN  HCl (SYNJARDY ) 12.05-998 MG TABS  Take 1 tablet by mouth 2 (two) times daily. 06/10/23   Jayson Michael, MD  glucose blood (ACCU-CHEK GUIDE) test strip Use as directed to check blood sugar once daily. 12/18/21   Barnetta Liberty, MD  Insulin  Degludec-Liraglutide  (XULTOPHY ) 100-3.6 UNIT-MG/ML SOPN Inject 31 Units into the skin at bedtime. 06/10/23   Jayson Michael, MD  Insulin  Pen Needle (PEN NEEDLES) 32G X 4 MM MISC use as directed 02/23/23   Atway, Rayann N, DO  latanoprost  (XALATAN ) 0.005 % ophthalmic solution Place 1 drop into both eyes at bedtime. 04/26/23     losartan  (COZAAR ) 100 MG tablet Take 1/2 tablet (50 mg total) by mouth daily. 06/10/23   Jayson Michael, MD  metoprolol  succinate (TOPROL -XL) 25 MG 24 hr tablet Take 3 tablets (75 mg total) by mouth daily. 06/10/23   Jayson Michael, MD  naproxen  (NAPROSYN ) 500 MG tablet Take 1 tablet (500 mg total) by mouth 2 (two) times daily with a meal. 06/10/23 06/09/24  Jayson Michael, MD  nitroGLYCERIN  (NITROSTAT ) 0.4 MG SL tablet Place 1 tablet (0.4 mg total) under the tongue every 5 (five) minutes as needed for chest pain. 11/20/22   Audria Leather, MD  pantoprazole  (PROTONIX ) 40 MG tablet Take 1 tablet (40 mg total) by mouth daily. 11/20/22 12/20/22  Audria Leather, MD  polyethylene glycol (MIRALAX ) 17 g packet Dissolve 17 grams (1 packet) in 4-8 ounces of fluid and take by mouth daily as needed for mild constipation or moderate constipation. 11/20/22  Audria Leather, MD  rivaroxaban  (XARELTO ) 20 MG TABS tablet Take 1 tablet (20 mg total) by mouth daily with supper. 05/20/22   Barnetta Liberty, MD  spironolactone  (ALDACTONE ) 25 MG tablet Take 1 tablet (25 mg total) by mouth daily. 06/10/23   Jayson Michael, MD      Allergies    Patient has no known allergies.    Review of Systems   Review of Systems  Constitutional:  Negative for fever.  Respiratory:  Positive for cough. Negative for shortness of breath.     Physical Exam Updated Vital Signs BP (!) 159/73 (BP Location: Right Arm)   Pulse 73   Temp  98.8 F (37.1 C)   Resp 16   Ht 5\' 6"  (1.676 m)   Wt 77.1 kg   LMP 10/01/2006   SpO2 98%   BMI 27.44 kg/m  Physical Exam Vitals and nursing note reviewed.  Constitutional:      General: She is not in acute distress.    Appearance: She is well-developed.  HENT:     Head: Normocephalic and atraumatic.     Mouth/Throat:     Pharynx: No oropharyngeal exudate.  Eyes:     Pupils: Pupils are equal, round, and reactive to light.  Cardiovascular:     Rate and Rhythm: Regular rhythm.     Heart sounds: Normal heart sounds.  Pulmonary:     Effort: Pulmonary effort is normal. No respiratory distress.     Breath sounds: Normal breath sounds.  Abdominal:     General: Bowel sounds are normal. There is no distension.     Palpations: Abdomen is soft.     Tenderness: There is no abdominal tenderness.  Musculoskeletal:        General: No tenderness or deformity.     Cervical back: Normal range of motion.     Right lower leg: No edema.     Left lower leg: No edema.  Skin:    General: Skin is warm and dry.  Neurological:     Mental Status: She is alert and oriented to person, place, and time.     ED Results / Procedures / Treatments   Labs (all labs ordered are listed, but only abnormal results are displayed) Labs Reviewed  RESP PANEL BY RT-PCR (RSV, FLU A&B, COVID)  [id number]    EKG None  Radiology No results found.  Procedures Procedures    Medications Ordered in ED Medications - No data to display  ED Course/ Medical Decision Making/ A&P                                 Medical Decision Making   Patient presented to the ED with a chief complaint nasal congestion, runny nose, body aches for the past 3 days.  Has been taking Tylenol  to help with symptomatic treatment, but there has not been any improvement.  She also reports of some back pain, exacerbated with any type of ambulation along with movement.  No bowel or bladder complaints.  Significant ED afebrile, lungs are  clear to auscultation, no wheezing, rhonchi or rales.  Abdomen is soft, nontender to palpation.  Bilateral calfs without any swelling or tenderness noted.  She has some tenderness along the lumbar spine, but nothing midline.  No prior history of IV drug use, no fever to suggest deep space infection.  We discussed over-the-counter treatment for her URI symptoms, will also provide her a  short course of anti-inflammatory steroid to help with pain control.  Return precaution discussed at length, patient stable for discharge.  Portions of this note were generated with Scientist, clinical (histocompatibility and immunogenetics). Dictation errors may occur despite best attempts at proofreading.   Final Clinical Impression(s) / ED Diagnoses Final diagnoses:  Viral illness    Rx / DC Orders ED Discharge Orders     None         Luellen Sages, PA-C 06/15/23 1342

## 2023-06-15 NOTE — Discharge Instructions (Addendum)
 You tested negative for COVID-19, influenza A, influenza B.  You may purchase some over-the-counter TheraFlu versus Nexium.

## 2023-06-15 NOTE — ED Triage Notes (Signed)
 Pt to ED c/o cold symptoms x 3 days,reports  cough, sneezing, running nose, body aches. Reports no relief with OTC.

## 2023-06-22 ENCOUNTER — Other Ambulatory Visit (HOSPITAL_COMMUNITY): Payer: Self-pay

## 2023-06-23 ENCOUNTER — Other Ambulatory Visit (HOSPITAL_COMMUNITY): Payer: Self-pay

## 2023-07-07 ENCOUNTER — Other Ambulatory Visit (HOSPITAL_COMMUNITY): Payer: Self-pay

## 2023-07-19 ENCOUNTER — Other Ambulatory Visit: Payer: Self-pay

## 2023-07-19 ENCOUNTER — Observation Stay (HOSPITAL_COMMUNITY)
Admission: EM | Admit: 2023-07-19 | Discharge: 2023-07-20 | Disposition: A | Discharge status: Home / Self Care | Attending: Emergency Medicine | Admitting: Emergency Medicine

## 2023-07-19 ENCOUNTER — Emergency Department (HOSPITAL_COMMUNITY)

## 2023-07-19 DIAGNOSIS — N179 Acute kidney failure, unspecified: Secondary | ICD-10-CM | POA: Insufficient documentation

## 2023-07-19 DIAGNOSIS — I160 Hypertensive urgency: Secondary | ICD-10-CM | POA: Diagnosis not present

## 2023-07-19 DIAGNOSIS — Z794 Long term (current) use of insulin: Secondary | ICD-10-CM | POA: Diagnosis not present

## 2023-07-19 DIAGNOSIS — I4891 Unspecified atrial fibrillation: Secondary | ICD-10-CM | POA: Diagnosis not present

## 2023-07-19 DIAGNOSIS — I421 Obstructive hypertrophic cardiomyopathy: Secondary | ICD-10-CM | POA: Diagnosis present

## 2023-07-19 DIAGNOSIS — E1165 Type 2 diabetes mellitus with hyperglycemia: Secondary | ICD-10-CM | POA: Diagnosis not present

## 2023-07-19 DIAGNOSIS — R739 Hyperglycemia, unspecified: Secondary | ICD-10-CM

## 2023-07-19 DIAGNOSIS — R079 Chest pain, unspecified: Secondary | ICD-10-CM | POA: Diagnosis present

## 2023-07-19 DIAGNOSIS — E1169 Type 2 diabetes mellitus with other specified complication: Secondary | ICD-10-CM | POA: Diagnosis present

## 2023-07-19 DIAGNOSIS — N3090 Cystitis, unspecified without hematuria: Secondary | ICD-10-CM | POA: Diagnosis not present

## 2023-07-19 LAB — CBC
HCT: 40.3 % (ref 36.0–46.0)
Hemoglobin: 13.2 g/dL (ref 12.0–15.0)
MCH: 26 pg (ref 26.0–34.0)
MCHC: 32.8 g/dL (ref 30.0–36.0)
MCV: 79.5 fL — ABNORMAL LOW (ref 80.0–100.0)
Platelets: 222 K/uL (ref 150–400)
RBC: 5.07 MIL/uL (ref 3.87–5.11)
RDW: 14.3 % (ref 11.5–15.5)
WBC: 8.2 K/uL (ref 4.0–10.5)
nRBC: 0 % (ref 0.0–0.2)

## 2023-07-19 NOTE — ED Triage Notes (Signed)
 Patient reports central chest pain with mild SOB and dry cough this evening , denies emesis or diaphoresis .

## 2023-07-20 ENCOUNTER — Other Ambulatory Visit (HOSPITAL_COMMUNITY): Payer: Self-pay

## 2023-07-20 ENCOUNTER — Telehealth (HOSPITAL_COMMUNITY): Payer: Self-pay | Admitting: Pharmacy Technician

## 2023-07-20 DIAGNOSIS — I4891 Unspecified atrial fibrillation: Secondary | ICD-10-CM | POA: Diagnosis not present

## 2023-07-20 DIAGNOSIS — I421 Obstructive hypertrophic cardiomyopathy: Secondary | ICD-10-CM

## 2023-07-20 DIAGNOSIS — Z7984 Long term (current) use of oral hypoglycemic drugs: Secondary | ICD-10-CM

## 2023-07-20 DIAGNOSIS — E1169 Type 2 diabetes mellitus with other specified complication: Secondary | ICD-10-CM | POA: Diagnosis not present

## 2023-07-20 DIAGNOSIS — Z794 Long term (current) use of insulin: Secondary | ICD-10-CM

## 2023-07-20 DIAGNOSIS — I10 Essential (primary) hypertension: Secondary | ICD-10-CM

## 2023-07-20 DIAGNOSIS — N3090 Cystitis, unspecified without hematuria: Secondary | ICD-10-CM

## 2023-07-20 DIAGNOSIS — I160 Hypertensive urgency: Principal | ICD-10-CM

## 2023-07-20 DIAGNOSIS — N179 Acute kidney failure, unspecified: Secondary | ICD-10-CM

## 2023-07-20 LAB — BASIC METABOLIC PANEL WITH GFR
Anion gap: 10 (ref 5–15)
Anion gap: 11 (ref 5–15)
BUN: 11 mg/dL (ref 6–20)
BUN: 16 mg/dL (ref 6–20)
CO2: 25 mmol/L (ref 22–32)
CO2: 26 mmol/L (ref 22–32)
Calcium: 9 mg/dL (ref 8.9–10.3)
Calcium: 9.1 mg/dL (ref 8.9–10.3)
Chloride: 101 mmol/L (ref 98–111)
Chloride: 96 mmol/L — ABNORMAL LOW (ref 98–111)
Creatinine, Ser: 0.86 mg/dL (ref 0.44–1.00)
Creatinine, Ser: 1.26 mg/dL — ABNORMAL HIGH (ref 0.44–1.00)
GFR, Estimated: 51 mL/min — ABNORMAL LOW (ref >60)
GFR, Estimated: >60 mL/min (ref >60)
Glucose, Bld: 160 mg/dL — ABNORMAL HIGH (ref 70–99)
Glucose, Bld: 357 mg/dL — ABNORMAL HIGH (ref 70–99)
Potassium: 3.1 mmol/L — ABNORMAL LOW (ref 3.5–5.1)
Potassium: 4.3 mmol/L (ref 3.5–5.1)
Sodium: 132 mmol/L — ABNORMAL LOW (ref 135–145)
Sodium: 137 mmol/L (ref 135–145)

## 2023-07-20 LAB — URINALYSIS, ROUTINE W REFLEX MICROSCOPIC
Bacteria, UA: NONE SEEN
Bilirubin Urine: NEGATIVE
Glucose, UA: >=500 mg/dL — AB
Hgb urine dipstick: NEGATIVE
Ketones, ur: NEGATIVE mg/dL
Nitrite: POSITIVE — AB
Protein, ur: NEGATIVE mg/dL
Specific Gravity, Urine: 1.016 (ref 1.005–1.030)
pH: 5 (ref 5.0–8.0)

## 2023-07-20 LAB — GLUCOSE, CAPILLARY
Glucose-Capillary: 258 mg/dL — ABNORMAL HIGH (ref 70–99)
Glucose-Capillary: 181 mg/dL — ABNORMAL HIGH (ref 70–99)

## 2023-07-20 LAB — TROPONIN I (HIGH SENSITIVITY)
Troponin I (High Sensitivity): 38 ng/L — ABNORMAL HIGH (ref <18)
Troponin I (High Sensitivity): 39 ng/L — ABNORMAL HIGH (ref <18)

## 2023-07-20 LAB — MAGNESIUM: Magnesium: 2.1 mg/dL (ref 1.7–2.4)

## 2023-07-20 LAB — CBG MONITORING, ED
Glucose-Capillary: 305 mg/dL — ABNORMAL HIGH (ref 70–99)
Glucose-Capillary: 200 mg/dL — ABNORMAL HIGH (ref 70–99)

## 2023-07-20 LAB — HEMOGLOBIN A1C
Hgb A1c MFr Bld: 12.5 % — ABNORMAL HIGH (ref 4.8–5.6)
Mean Plasma Glucose: 312.05 mg/dL

## 2023-07-20 LAB — D-DIMER, QUANTITATIVE: D-Dimer, Quant: <0.27 ug{FEU}/mL (ref 0.00–0.50)

## 2023-07-20 MED ORDER — POTASSIUM CHLORIDE CRYS ER 20 MEQ PO TBCR
60.0000 meq | EXTENDED_RELEASE_TABLET | Freq: Once | ORAL | Status: AC
  Administered 2023-07-20: 60 meq via ORAL
  Filled 2023-07-20: qty 3

## 2023-07-20 MED ORDER — HYDRALAZINE HCL 20 MG/ML IJ SOLN
5.0000 mg | Freq: Once | INTRAMUSCULAR | Status: AC
  Administered 2023-07-20: 5 mg via INTRAVENOUS

## 2023-07-20 MED ORDER — INSULIN DEGLUDEC-LIRAGLUTIDE 100-3.6 UNIT-MG/ML ~~LOC~~ SOPN: 31.0000 [IU] | PEN_INJECTOR | Freq: Every day | SUBCUTANEOUS | Status: DC

## 2023-07-20 MED ORDER — METOPROLOL SUCCINATE ER 25 MG PO TB24
75.0000 mg | ORAL_TABLET | Freq: Every day | ORAL | Status: DC
  Administered 2023-07-20: 75 mg via ORAL
  Filled 2023-07-20: qty 3

## 2023-07-20 MED ORDER — APIXABAN 5 MG PO TABS
5.0000 mg | ORAL_TABLET | Freq: Two times a day (BID) | ORAL | 3 refills | Status: AC
  Filled 2023-07-20: qty 60, 30d supply, fill #0

## 2023-07-20 MED ORDER — AMLODIPINE BESYLATE 5 MG PO TABS
5.0000 mg | ORAL_TABLET | Freq: Every day | ORAL | Status: AC
  Administered 2023-07-20: 5 mg via ORAL
  Filled 2023-07-20: qty 1

## 2023-07-20 MED ORDER — INSULIN ASPART 100 UNIT/ML IJ SOLN
0.0000 [IU] | Freq: Three times a day (TID) | INTRAMUSCULAR | Status: DC
  Administered 2023-07-20: 2 [IU] via SUBCUTANEOUS
  Administered 2023-07-20: 5 [IU] via SUBCUTANEOUS

## 2023-07-20 MED ORDER — ACETAMINOPHEN 325 MG PO TABS
650.0000 mg | ORAL_TABLET | Freq: Four times a day (QID) | ORAL | Status: DC | PRN
Start: 2023-07-20 — End: 2023-07-20

## 2023-07-20 MED ORDER — INSULIN GLARGINE-YFGN 100 UNIT/ML ~~LOC~~ SOLN
20.0000 [IU] | Freq: Every day | SUBCUTANEOUS | Status: DC
  Administered 2023-07-20: 20 [IU] via SUBCUTANEOUS
  Filled 2023-07-20: qty 0.2

## 2023-07-20 MED ORDER — APIXABAN 5 MG PO TABS
5.0000 mg | ORAL_TABLET | Freq: Two times a day (BID) | ORAL | Status: DC
  Administered 2023-07-20: 5 mg via ORAL
  Filled 2023-07-20: qty 1

## 2023-07-20 MED ORDER — RIVAROXABAN 10 MG PO TABS
20.0000 mg | ORAL_TABLET | Freq: Every day | ORAL | Status: DC
  Filled 2023-07-20: qty 2

## 2023-07-20 MED ORDER — SODIUM CHLORIDE 0.9 % IV SOLN: 1.0000 g | INTRAVENOUS | Status: DC

## 2023-07-20 MED ORDER — ACETAMINOPHEN 650 MG RE SUPP: 650.0000 mg | Freq: Four times a day (QID) | RECTAL | Status: DC | PRN

## 2023-07-20 MED ORDER — AMLODIPINE BESYLATE 10 MG PO TABS
10.0000 mg | ORAL_TABLET | Freq: Every day | ORAL | 11 refills | Status: AC
Start: 2023-07-21 — End: ?
  Filled 2023-07-20: qty 30, 30d supply, fill #0

## 2023-07-20 MED ORDER — POLYETHYLENE GLYCOL 3350 17 G PO PACK: 17.0000 g | PACK | Freq: Every day | ORAL | Status: DC | PRN

## 2023-07-20 MED ORDER — LATANOPROST 0.005 % OP SOLN
1.0000 [drp] | Freq: Every day | OPHTHALMIC | Status: DC
  Filled 2023-07-20: qty 2.5

## 2023-07-20 MED ORDER — HYDRALAZINE HCL 20 MG/ML IJ SOLN
5.0000 mg | Freq: Once | INTRAMUSCULAR | Status: AC
  Administered 2023-07-20: 5 mg via INTRAVENOUS
  Filled 2023-07-20: qty 1

## 2023-07-20 MED ORDER — INSULIN ASPART 100 UNIT/ML IJ SOLN
0.0000 [IU] | INTRAMUSCULAR | Status: DC
  Administered 2023-07-20: 11 [IU] via SUBCUTANEOUS
  Administered 2023-07-20: 3 [IU] via SUBCUTANEOUS

## 2023-07-20 MED ORDER — AMLODIPINE BESYLATE 5 MG PO TABS
5.0000 mg | ORAL_TABLET | Freq: Once | ORAL | Status: AC
  Administered 2023-07-20: 5 mg via ORAL
  Filled 2023-07-20: qty 1

## 2023-07-20 MED ORDER — POTASSIUM CHLORIDE CRYS ER 20 MEQ PO TBCR
40.0000 meq | EXTENDED_RELEASE_TABLET | Freq: Once | ORAL | Status: AC
  Administered 2023-07-20: 40 meq via ORAL
  Filled 2023-07-20: qty 2

## 2023-07-20 MED ORDER — INSULIN ASPART 100 UNIT/ML IJ SOLN: 0.0000 [IU] | Freq: Every day | INTRAMUSCULAR | Status: DC

## 2023-07-20 MED ORDER — AMLODIPINE BESYLATE 10 MG PO TABS: 10.0000 mg | ORAL_TABLET | Freq: Every day | ORAL | Status: DC

## 2023-07-20 MED ORDER — SODIUM CHLORIDE 0.9 % IV SOLN
1.0000 g | Freq: Once | INTRAVENOUS | Status: AC
  Administered 2023-07-20: 1 g via INTRAVENOUS
  Filled 2023-07-20: qty 10

## 2023-07-20 NOTE — Telephone Encounter (Addendum)
 Patient Product/process development scientist completed.    The patient is insured through [facility]. Patient has [employer], may use a copay card, and/or apply for patient assistance if available.    Ran test claim for Xarelto  20 mg and the current 30 day co-pay is $30.00.  Ran test claim for Eliquis  5 mg and the current 30 day co-pay is $30.00.  This test claim was processed through [facility]- copay amounts may vary at other pharmacies due to pharmacy/plan contracts, or as the patient moves through the different stages of their insurance plan.     Reyes Sharps, CPHT Pharmacy Technician III Certified Patient Advocate [facility] Patient Advocate Team Direct Number: [phone]  Fax: [phone]

## 2023-07-20 NOTE — Discharge Summary (Signed)
 Name: [name] MRN: [id number] DOB: July 06, 1970 53 y.o. PCP: Norrine Sharper, MD  Date of Admission: 07/19/2023 11:23 PM Date of Discharge: 07/20/2023 Attending Physician: Dr. Dayton Eastern  Discharge Diagnosis: 1. Principal Problem:   Hypertensive urgency Active Problems:   Atrial fibrillation (HCC)   Type 2 diabetes mellitus with other specified complication (HCC)   HOCM (hypertrophic obstructive cardiomyopathy) (HCC)   Cystitis   AKI (acute kidney injury) (HCC)   Discharge Medications: Allergies as of 07/20/2023   No Known Allergies      Medication List     STOP taking these medications    naproxen  500 MG tablet Commonly known as: Naprosyn    Xarelto  20 MG Tabs tablet Generic drug: rivaroxaban        TAKE these medications    Accu-Chek Guide test strip Generic drug: glucose blood Use as directed to check blood sugar once daily.   Accu-Chek Guide w/Device Kit Use to check blood sugar once daily.   Accu-Chek Softclix Lancets lancets Use as instructed   acetaminophen  500 MG tablet Commonly known as: TYLENOL  Take 500 mg by mouth every 6 (six) hours as needed for moderate pain or headache.   amLODipine  10 MG tablet Commonly known as: NORVASC  Take 1 tablet (10 mg total) by mouth daily. Start taking on: July 21, 2023 What changed:  medication strength how much to take   Eliquis  5 MG Tabs tablet Generic drug: apixaban  Take 1 tablet (5 mg total) by mouth 2 (two) times daily.   Insupen Pen Needles 32G X 4 MM Misc Generic drug: Insulin  Pen Needle use as directed   latanoprost  0.005 % ophthalmic solution Commonly known as: XALATAN  Place 1 drop into both eyes at bedtime.   losartan  100 MG tablet Commonly known as: Cozaar  Take 1/2 tablet (50 mg total) by mouth daily.   metoprolol  succinate 25 MG 24 hr tablet Commonly known as: TOPROL -XL Take 3 tablets (75 mg total) by mouth daily.   nitroGLYCERIN  0.4 MG SL tablet Commonly known as:  NITROSTAT  Place 1 tablet (0.4 mg total) under the tongue every 5 (five) minutes as needed for chest pain.   spironolactone  25 MG tablet Commonly known as: Aldactone  Take 1 tablet (25 mg total) by mouth daily.   Synjardy  12.05-998 MG Tabs Generic drug: Empagliflozin -metFORMIN  HCl Take 1 tablet by mouth 2 (two) times daily.   Xultophy  100-3.6 UNIT-MG/ML Sopn Generic drug: Insulin  Degludec-Liraglutide  Inject 31 Units into the skin at bedtime.        Disposition and follow-up:   [name] was discharged from [facility] in Stable condition.  At the hospital follow up visit please address:  HOCM--Please ensure pt has contacted her cardiologist as she was due for an updated echo in Jan 2025.  T2DM--ensure pt is maintaining adequate glycemic control Medication adherence--pt may have difficulty affording or picking up medications.  Hypokalemia--recheck BMP Asymptomatic cystitis--UA with nitrites and leukocytes on admission. Ensure pt remains asymptomatic. She is likely colonized  2.  Labs / imaging needed at time of follow-up: BG, BMP  3.  Pending labs/ test needing follow-up: Urine culture  Follow-up Appointments:    Hospital Course by problem list: [name] is a 53 y.o. person living with a history of HTN, T2DM, a-fib, and HOCM who presented with chest pain and admitted for hypertensive urgency and hyperglycemia  now being discharged on hospital day 0 with the following pertinent hospital course:  Chest Pain HOCM Hypertensive Urgency  The pt presented  to the ED on 7/15 with multiple hours worth of chest pain. She does have a history of chest pain episodes like this that typically resolve quickly, however this episode would not resolve with tylenol  or rest. Her BP was 212/100 at 11PM and was given 10mg  of hydralazine  and 5mg  of amlodipine  after which her BP was 153/80 which resolution of her chest pain. She was admitted to the internal medicine  service for BP management and hyperglycemia. There is an aspect of medication nonadherence. The pt was evaluated on 7/15 and denied CP, SOB, Palpitations, pre-syncope, or headache. She was deemed safe for discharge with education of proper medication management. Before she left, she was counseled by the pharmacist on how to take and manage her eliquis  properly. She was also encouraged to follow up with her cardiologist as she was due for an updated echo in January of this year.   Hyperglycemia On admission the pt;s BG was 357. She was given 14 total units of novolog  with resolution of hyperglycemia at 160. Likely a component of nonadherence at home, unsure if the patient is having difficulty obtaining or affording her medications. On 7/15, she was seen and deemed safe for discharge with education of proper diabetic medication management.    Subjective Pt was seen and examined at the bed side this morning. Sh e was feeling much better and reports that she is no longer experiencing chest pain. We did have a long conversation about her ability to afford and pick up her medications and the importance of taking them as prescribed. She also reported that she has not had urinary symptoms in over a month. All of questions and concerns were addressed at this time.   Discharge Exam:   BP (!) 142/82 (BP Location: Left Arm)   Pulse 80   Temp 98.4 F (36.9 C) (Oral)   Resp 18   LMP 10/01/2006   SpO2 95%  Discharge exam:  Const: Awake, alert in NAD HENT: Normocephalic, atraumatic, mucus membranes moist Card: RRR, systolic murmur noted on left sternal border. No pitting edema on LE's bilaterally  Resp: LCTAB, no increased work of breathing Abd: Soft, NTND   Pertinent Labs, Studies, and Procedures:     Latest Ref Rng & Units 07/19/2023   11:35 PM 11/20/2022    7:06 AM 11/18/2022    8:03 AM  CBC  WBC 4.0 - 10.5 K/uL 8.2  6.6  11.7   Hemoglobin 12.0 - 15.0 g/dL 86.7  88.0  85.6   Hematocrit 36.0 - 46.0  % 40.3  38.3  45.0   Platelets 150 - 400 K/uL 222  217  262        Latest Ref Rng & Units 07/20/2023    5:26 AM 07/19/2023   11:35 PM 03/25/2023    3:18 PM  CMP  Glucose 70 - 99 mg/dL 839  642  762   BUN 6 - 20 mg/dL 11  16  18    Creatinine 0.44 - 1.00 mg/dL 9.13  8.73  8.91   Sodium 135 - 145 mmol/L 137  132  141   Potassium 3.5 - 5.1 mmol/L 3.1  4.3  3.8   Chloride 98 - 111 mmol/L 101  96  97   CO2 22 - 32 mmol/L 25  26  24    Calcium 8.9 - 10.3 mg/dL 9.1  9.0  9.8     DG Chest 2 View Result Date: 07/19/2023 CLINICAL DATA:  Chest pain EXAM: CHEST - 2 VIEW  COMPARISON:  11/19/2022 FINDINGS: Stable cardiomediastinal silhouette. Patchy linear opacities in the left mid and lower lung are favored to represent atelectasis. Otherwise no focal consolidation. No pleural effusion or pneumothorax. No displaced rib fractures. IMPRESSION: Left basilar atelectasis.  No acute abnormality. Electronically Signed   By: Norman Gatlin M.D.   On: 07/19/2023 23:47     Discharge Instructions: Discharge Instructions     Call MD for:  extreme fatigue   Complete by: As directed    Call MD for:  persistant dizziness or light-headedness   Complete by: As directed    Call MD for:  persistant nausea and vomiting   Complete by: As directed    Call MD for:  redness, tenderness, or signs of infection (pain, swelling, redness, odor or green/yellow discharge around incision site)   Complete by: As directed    Call MD for:  temperature >100.4   Complete by: As directed    Diet - low sodium heart healthy   Complete by: As directed    Discharge instructions   Complete by: As directed    Thank you for allowing us  to be part of your care. You were hospitalized for very high blood pressure. We treated you with blood pressure medications.  We also noticed your blood sugars were high, which we treated with insulin . You are now stable and being discharged from the hospital.  See the changes in your medications and  management of your chronic conditions below:  *For your atrial fibrillation -We have STARTED you on these following medications:  - Eliquis  (Apixaban ) 5 mg every 12 hours  - CONTINUE taking your metoprolol   -We have STOPPED the following medications:  - Xarelto . DO NOT TAKE THIS ONE. The Eliquis  we are sending you home with will take the place of this one *For your blood pressure -We have:  - INCREASED you amlodipine  to 10 mg, once daily.  *For your diabetes, - Continue taking your Xutholphy at home - Continue taking the Synjardy  ( Metformin  AND empagliflozin ). You can try taking both pills at once with a large meal. If this causes stomach upset, switch to taking it every 12 hours.   FOLLOW UP APPOINTMENTS: We arranged for you to follow up with your family doctor at: [facility] on [address] on Tuesday July 27, 2023 10:45 AM   Please call your PCP or our clinic if you have any questions or concerns, we may be able to help and keep you from a long and expensive emergency room wait. Our clinic and after hours phone number is [phone]. The best time to call is Monday through Friday 9 am to 4 pm but there is always someone available 24/7 if you have an emergency. If you need medication refills please notify your pharmacy one week in advance and they will send us  a request.   We are glad you are feeling better,  Internal Medicine Inpatient Teaching Service at [facility]   Increase activity slowly   Complete by: As directed        Signed: Myrna Bitters, DO 07/20/2023, 5:14 PM

## 2023-07-20 NOTE — Discharge Instructions (Addendum)
 Thank you for allowing us  to be part of your care. You were hospitalized for very high blood pressure. We treated you with blood pressure medications.  We also noticed your blood sugars were high, which we treated with insulin . You are now stable and being discharged from the hospital.  See the changes in your medications and management of your chronic conditions below:  *For your atrial fibrillation -We have STARTED you on these following medications:  - Eliquis  (Apixaban ) 5 mg every 12 hours  - CONTINUE taking your metoprolol   -We have STOPPED the following medications:  - Xarelto . DO NOT TAKE THIS ONE. The Eliquis  we are sending you home with will take the place of this one *For your blood pressure -We have:  - INCREASED you amlodipine  to 10 mg, once daily.  *For your diabetes, - Continue taking your Xutholphy at home - Continue taking the Synjardy  ( Metformin  AND empagliflozin ). You can try taking both pills at once with a large meal. If this causes stomach upset, switch to taking it every 12 hours.   FOLLOW UP APPOINTMENTS: We arranged for you to follow up with your family doctor at: the [facility] on [address] on Tuesday July 27, 2023 10:45 AM   Please call your PCP or our clinic if you have any questions or concerns, we may be able to help and keep you from a long and expensive emergency room wait. Our clinic and after hours phone number is [phone]. The best time to call is Monday through Friday 9 am to 4 pm but there is always someone available 24/7 if you have an emergency. If you need medication refills please notify your pharmacy one week in advance and they will send us  a request.   We are glad you are feeling better,  Internal Medicine Inpatient Teaching Service at [facility]  Information on my medicine - ELIQUIS  (apixaban )  This medication education was reviewed with me or my healthcare representative as part of my discharge preparation.    Why was  Eliquis  prescribed for you? Eliquis  was prescribed for you to reduce the risk of a blood clot forming that can cause a stroke if you have a medical condition called atrial fibrillation (a type of irregular heartbeat).  What do You need to know about Eliquis  ? Take your Eliquis  TWICE DAILY - one tablet in the morning and one tablet in the evening with or without food. If you have difficulty swallowing the tablet whole please discuss with your pharmacist how to take the medication safely.  Take Eliquis  exactly as prescribed by your doctor and DO NOT stop taking Eliquis  without talking to the doctor who prescribed the medication.  Stopping may increase your risk of developing a stroke.  Refill your prescription before you run out.  After discharge, you should have regular check-up appointments with your healthcare provider that is prescribing your Eliquis .  In the future your dose may need to be changed if your kidney function or weight changes by a significant amount or as you get older.  What do you do if you miss a dose? If you miss a dose, take it as soon as you remember on the same day and resume taking twice daily.  Do not take more than one dose of ELIQUIS  at the same time to make up a missed dose.  Important Safety Information A possible side effect of Eliquis  is bleeding. You should call your healthcare provider right away if you experience any  of the following: Bleeding from an injury or your nose that does not stop. Unusual colored urine (red or dark brown) or unusual colored stools (red or black). Unusual bruising for unknown reasons. A serious fall or if you hit your head (even if there is no bleeding).  Some medicines may interact with Eliquis  and might increase your risk of bleeding or clotting while on Eliquis . To help avoid this, consult your healthcare provider or pharmacist prior to using any new prescription or non-prescription medications, including herbals, vitamins,  non-steroidal anti-inflammatory drugs (NSAIDs) and supplements.  This website has more information on Eliquis  (apixaban ): http://[email or web address] .com/eliquis dena

## 2023-07-20 NOTE — Hospital Course (Addendum)
 Chest Pain HOCM Hypertensive Urgency  The pt presented to the ED on 7/15 with multiple hours worth of chest pain. She does have a history of chest pain episodes like this that typically resolve quickly, however this episode would not resolve with tylenol  or rest. Her BP was 212/100 at 11PM and was given 10mg  of hydralazine  and 5mg  of amlodipine  after which her BP was 153/80 which resolution of her chest pain. She was admitted to the internal medicine service for BP management and hyperglycemia. There is an aspect of medication nonadherence. The pt was evaluated on 7/15 and denied CP, SOB, Palpitations, pre-syncope, or headache. She was deemed safe for discharge with education of proper medication management. Before she left, she was counseled by the pharmacist on how to take and manage her eliquis  properly. She was also encouraged to follow up with her cardiologist as she was due for an updated echo in January of this year.   Hyperglycemia On admission the pt;s BG was 357. She was given 14 total units of novolog  with resolution of hyperglycemia at 160. Likely a component of nonadherence at home, unsure if the patient is having difficulty obtaining or affording her medications. On 7/15, she was seen and deemed safe for discharge with education of proper diabetic medication management.

## 2023-07-20 NOTE — ED Notes (Signed)
 Nt called [facility] 5:02 am

## 2023-07-20 NOTE — H&P (Signed)
 Date: 07/20/2023               Patient Name:  [name] MRN: [id number]  DOB: [birth year] Age / Sex: 53 y.o., female   PCP: Norrine Sharper, MD         Medical Service: Internal Medicine Teaching Service         Attending Physician: Dr. Dayton Eastern      First Contact: Schuyler Novak, DO    Second Contact: Dr. Hadassah Kristy Ahr, MD          Pager Information: First Contact Pager: [phone]   Second Contact Pager: [phone]   SUBJECTIVE   Chief Complaint: Chest Pain  History of Present Illness: DENNY LAVE is a 53 y.o. female with PMH of HTN, DM, Afib who presents to the ED with chest pain that started yesterday (7/14) around 10PM. She had experienced this kind of CP years ago. The pain feels like someone is sitting on the patient's chest. It is in the center of the chest, above her breasts and it radiates to the right and left breast. Pain comes and goes and was rated as 8/10 by the pt. When the CP starts, it goes on for 3 mins. Last night it was for 30 mins and kept coming leading her to come to the emergency department. Pain is not associated with moving--it just comes in as sharp pain while she is lying down. It suddenly started last night. Pt did have some CP on 7/13 but it went away when she took tylenol . Last night it occurred again but tylenol  did not help so she came to the ED. Pt denies any fevers, chills, nausea, vomiting, HA, LOC, vision changes. She does get short of breath with the chest pain. She sometimes feels dizzy while walking. She experienced this dizziness a year ago or two ago. She said this CP worsened her dizziness. She states her pain worsens when she is lying down on her back. She also mentioned the pain gets better when she sat up but doesn't change when she leans forward. She has no true cough but has had to clear her throat. She has some burning sensation in her chest. Pt also has right thigh pain. Pt did not have any CP upon my exam. She had no pain while  urinating but mentioned urine odor change and increased urinary frequency. Pt adheres to her medications.  ED Course: Labs significant for: Urinalysis was positive nitrites and leukocytes Elevated Glucose: 357 Cr: 1.26 BP: 212/100(11PM)>>153/80 (5PM) Troponin: 39>>>38 Imaging: CXR: no acute abnormality with left basilar atelectasis Received : Norvasc , hydralazine , insulin , rocephin  Consulted IM for admission   Meds:  Patient reported:  Tylenol  500 mg every 6 hours as needed Maalox- not taking   Norvasc  5 mg daily - yes  Synjardy  12.05-998 mg twice daily- yes Xultophy  31 units nightly  Losartan  50 mg daily Latanoprost  eyedrops 1 drop nightly each eye Metoprolol  succinate 75 mg daily- yes Naproxen  500 mg twice daily- done  Nitroglycerin  tablet Protonix  40 mg daily- not on this medication MiraLAX  Xarelto  20 mg daily- yes  Spironolactone  25 mg daily  Past Medical History HTN, DM, Afib  Past Surgical History Past Surgical History:  Procedure Laterality Date   CESAREAN SECTION  2005; 2006   CHOLECYSTECTOMY N/A 03/25/2017   Procedure: LAPAROSCOPIC CHOLECYSTECTOMY;  Surgeon: Kinsinger, Herlene Righter, MD;  Location: [facility];  Service: General;  Laterality: N/A;   HERNIA REPAIR     VENTRAL HERNIA  REPAIR  02/05/2005   with mesh   Social:  Lives with: Daughter at home  Support: Good support with her daughter at home  Occupation: Stage manager at [employer]  Level of function: Independent in all ADLs and iADLs  PCP: Dr. Ozell Kung  Substance: No substance uses   Family History: Unsure about this  Family History  Problem Relation Age of Onset   Asthma Mother        alive in her 54's   Other Father        killed in [place] civil war when he was in his 32's   Other Sister        A & W   Other Sister        A & W   Heart disease Neg Hx    BRCA 1/2 Neg Hx    Breast cancer Neg Hx      Allergies: Allergies as of 07/19/2023   (No Known Allergies)     Review of Systems: A complete ROS was negative except as per HPI.   OBJECTIVE:   Physical Exam: Blood pressure (!) 153/78, pulse 72, temperature 98.2 F (36.8 C), temperature source Oral, resp. rate 16, last menstrual period 10/01/2006, SpO2 100%.  Constitutional: well-appearing female lying in her bed, in no acute distress HENT: normocephalic atraumatic, mucous membranes moist Cardiovascular: regular rhythm, tachycardic, Grade 3/6 systolic murmur best heard at left sternal border  Pulmonary/Chest: normal work of breathing on room air, lungs clear to auscultation bilaterally MSK: normal bulk and tone Neurological: 5/5 strength in bilateral upper and lower extremities Skin: warm and dry  Labs: CBC    Component Value Date/Time   WBC 8.2 07/19/2023 2335   RBC 5.07 07/19/2023 2335   HGB 13.2 07/19/2023 2335   HGB 13.0 05/01/2020 1623   HCT 40.3 07/19/2023 2335   HCT 40.8 05/01/2020 1623   PLT 222 07/19/2023 2335   PLT 273 05/01/2020 1623   MCV 79.5 (L) 07/19/2023 2335   MCV 80 05/01/2020 1623   MCH 26.0 07/19/2023 2335   MCHC 32.8 07/19/2023 2335   RDW 14.3 07/19/2023 2335   RDW 14.0 05/01/2020 1623   LYMPHSABS 2.5 11/20/2022 0706   MONOABS 0.7 11/20/2022 0706   EOSABS 0.1 11/20/2022 0706   BASOSABS 0.0 11/20/2022 0706     CMP     Component Value Date/Time   NA 137 07/20/2023 0526   NA 141 03/25/2023 1518   K 3.1 (L) 07/20/2023 0526   CL 101 07/20/2023 0526   CO2 25 07/20/2023 0526   GLUCOSE 160 (H) 07/20/2023 0526   BUN 11 07/20/2023 0526   BUN 18 03/25/2023 1518   CREATININE 0.86 07/20/2023 0526   CALCIUM 9.1 07/20/2023 0526   PROT 6.8 11/20/2022 0706   ALBUMIN 3.0 (L) 11/20/2022 0706   AST 23 11/20/2022 0706   ALT 22 11/20/2022 0706   ALKPHOS 91 11/20/2022 0706   BILITOT 0.4 11/20/2022 0706   GFRNONAA >60 07/20/2023 0526   GFRAA 98 12/07/2019 0950    Imaging:  DG Chest 2 View Result Date: 07/19/2023 CLINICAL DATA:  Chest pain EXAM: CHEST - 2 VIEW  COMPARISON:  11/19/2022 FINDINGS: Stable cardiomediastinal silhouette. Patchy linear opacities in the left mid and lower lung are favored to represent atelectasis. Otherwise no focal consolidation. No pleural effusion or pneumothorax. No displaced rib fractures. IMPRESSION: Left basilar atelectasis.  No acute abnormality. Electronically Signed   By: Norman Gatlin M.D.   On: 07/19/2023 23:47  EKG: personally reviewed my interpretation pt is in sinus rhythm with a regular rate. Afib not observed.   ASSESSMENT & PLAN:   Assessment & Plan by Problem: Principal Problem:   Hypertensive urgency Active Problems:   Atrial fibrillation (HCC)   Type 2 diabetes mellitus with other specified complication (HCC)   HOCM (hypertrophic obstructive cardiomyopathy) (HCC)   Cystitis   AKI (acute kidney injury) (HCC)   Trinette T Bushee is a 53 y.o. person living with a history of HTN, DM, afib, who presented with chest pain and admitted for Hypertensive urgency on hospital day 0  Principal Problem:       Hypertensive urgency Pt came in with chest pain with some associated dizziness. She had no vision changes.  Her ED course showed BP readings of 212/100(11PM)>>153/80 (5PM). Troponin was elevated but trended down. When we saw her, pt was well-appearing. However, her BP was 223/106 upon my evaluation. We highly suspect she has Hypertensive urgency given no overt evidence of endorgan damage. Since Hydralazine  helped bring her BP down, we gave her an IV dose of 5mg  hydralazine . We will also start her on Norvasc  5 mg in the morning and metoprolol  succinate 75 mg. Encourage PO intake and hold on to fluids to control BP.  Etiology is unknown, but I do suspect some medication nonadherence.  D-dimer is negative, low suspicion for PE.  Low suspicion for ACS or dissection. - Norvasc  5 mg today, followed by 10 mg tomorrow - Hold losartan  50 mg daily, hold spironolactone  25 mg daily - Resume home metoprolol  75 mg  daily - Monitor blood pressure closely      Cystitis Pt came in with urine that had an odor with inreased urinary frequency. Her urinalysis was postiive for nitrites and leukocytes. Pt will be receiving rocephin  1g IV for 3 days. - Rocephin  day 1 of 3 - Follow-up urine culture     AKI (acute kidney injury) (HCC) Pt Cr levels are elevated at 1.26 compared from last time. Likely Pre-renal in nature.  Hold home meds losartan  and spironolactone  till AKI resolves.  -Can resume losartan  and spironolactone  once creatinine back to baseline -Holding fluids given elevated BP, encourage p.o. intake - Monitor BMP    Type 2 diabetes mellitus with other specified complication (HCC) Uncontrolled A1c 12.5. She is hyperglycemic with Glucose levels of 357. However, pt does not have DKA or HHS. We started her on sliding scale novoLog  3 times daily with meals as well as bedtime correction. We will be holding her home med Synjardy  due to her AKI - Sliding scale insulin  - Continue home Xultophy  31 units daily - Monitor CBGs - Hold home Synjardy    Atrial fibrillation (HCC) Currently pt is not in Afib. Her current EKG shows normal sinus rhythm. She will continue taking her home Metoprolol  75 mg for Afib management.  - Continue Xarelto  20 mg daily - Continue metoprolol  25 mg daily - Monitor on telemetry    HOCM (hypertrophic obstructive cardiomyopathy) (HCC) LVOT gradient rest by TTE 01/11/19 20 mmHg trivial MRI done 04/17/19 confirms HOCM with septal thickness 17 mm. Basal septum hyper-enhancement present. Pt to continue beta blocker and verapamil . She is asymptomatic consider updating TTE next year and referral to Dr Dorsey. -No acute concerns during this hospitalization -Recommend following up with cardiologist outpatient, patient is overdue for echo   Best practice: Diet: Carb-Modified VTE: Xarelto  IVF: none; encourage PO intake Code: Full  Disposition planning: Prior to Admission Living  Arrangement: at home with  daughter Anticipated Discharge Location: Home  Dispo: Admit patient to Observation with expected length of stay less than 2 midnights. Will likely be d/c in the morning  Signed: Edgardo Pontiff, DO Internal Medicine Resident  07/20/2023, 6:33 AM  Please contact IM Residency On-Call Pager at: [phone] or [phone].

## 2023-07-20 NOTE — Plan of Care (Signed)

## 2023-07-20 NOTE — ED Provider Notes (Signed)
 [name] EMERGENCY DEPARTMENT AT [facility] Provider Note   CSN: [id number] Arrival date & time: 07/19/23  2317     Patient presents with: Chest Pain (Hypertensive)   [name] is a 53 y.o. female.   Presents to the emergency department for evaluation of chest pain.  Patient reports that she had discomfort in the chest earlier, this seems to have improved.  Blood pressure has been elevated.  She reports taking all of her medications as prescribed.       Prior to Admission medications   Medication Sig Start Date End Date Taking? Authorizing Provider  Accu-Chek Softclix Lancets lancets Use as instructed 12/18/21   Lemon Raisin, MD  acetaminophen  (TYLENOL ) 500 MG tablet Take 500 mg by mouth every 6 (six) hours as needed for moderate pain or headache.    [provider]  alum & mag hydroxide-simeth (MAALOX/MYLANTA) 200-200-20 MG/5ML suspension Take 15 mLs by mouth every 6 (six) hours as needed for indigestion or heartburn. 11/20/22   Cheryle Page, MD  amLODipine  (NORVASC ) 5 MG tablet Take 1 tablet (5 mg total) by mouth daily. 06/10/23 06/09/24  Francella Rogue, MD  Blood Glucose Monitoring Suppl (ACCU-CHEK GUIDE) w/Device KIT Use to check blood sugar once daily. 06/03/22   Lemon Raisin, MD  Continuous Glucose Receiver (DEXCOM G7 RECEIVER) DEVI Use as directed. 02/23/23   Atway, Rayann N, DO  Continuous Glucose Sensor (DEXCOM G7 SENSOR) MISC Change sensor every 10 days. 02/23/23   Atway, Rayann N, DO  Empagliflozin -metFORMIN  HCl (SYNJARDY ) 12.05-998 MG TABS Take 1 tablet by mouth 2 (two) times daily. 06/10/23   Francella Rogue, MD  glucose blood (ACCU-CHEK GUIDE) test strip Use as directed to check blood sugar once daily. 12/18/21   Lemon Raisin, MD  Insulin  Degludec-Liraglutide  (XULTOPHY ) 100-3.6 UNIT-MG/ML SOPN Inject 31 Units into the skin at bedtime. 06/10/23   Francella Rogue, MD  Insulin  Pen Needle (PEN NEEDLES) 32G X 4 MM MISC use as directed 02/23/23   Atway, Rayann  N, DO  latanoprost  (XALATAN ) 0.005 % ophthalmic solution Place 1 drop into both eyes at bedtime. 04/26/23     losartan  (COZAAR ) 100 MG tablet Take 1/2 tablet (50 mg total) by mouth daily. 06/10/23   Francella Rogue, MD  metoprolol  succinate (TOPROL -XL) 25 MG 24 hr tablet Take 3 tablets (75 mg total) by mouth daily. 06/10/23   Francella Rogue, MD  naproxen  (NAPROSYN ) 500 MG tablet Take 1 tablet (500 mg total) by mouth 2 (two) times daily with a meal. 06/10/23 06/09/24  Francella Rogue, MD  nitroGLYCERIN  (NITROSTAT ) 0.4 MG SL tablet Place 1 tablet (0.4 mg total) under the tongue every 5 (five) minutes as needed for chest pain. 11/20/22   Cheryle Page, MD  pantoprazole  (PROTONIX ) 40 MG tablet Take 1 tablet (40 mg total) by mouth daily. 11/20/22 12/20/22  Cheryle Page, MD  polyethylene glycol (MIRALAX ) 17 g packet Dissolve 17 grams (1 packet) in 4-8 ounces of fluid and take by mouth daily as needed for mild constipation or moderate constipation. 11/20/22   Cheryle Page, MD  rivaroxaban  (XARELTO ) 20 MG TABS tablet Take 1 tablet (20 mg total) by mouth daily with supper. 05/20/22   Lemon Raisin, MD  spironolactone  (ALDACTONE ) 25 MG tablet Take 1 tablet (25 mg total) by mouth daily. 06/10/23   Francella Rogue, MD    Allergies: Patient has no known allergies.    Review of Systems  Updated Vital Signs BP (!) 177/92   Pulse 72   Temp 98.2  F (36.8 C) (Oral)   Resp 16   LMP 10/01/2006   SpO2 100%   Physical Exam Vitals and nursing note reviewed.  Constitutional:      General: She is not in acute distress.    Appearance: She is well-developed.  HENT:     Head: Normocephalic and atraumatic.     Mouth/Throat:     Mouth: Mucous membranes are moist.  Eyes:     General: Vision grossly intact. Gaze aligned appropriately.     Extraocular Movements: Extraocular movements intact.     Conjunctiva/sclera: Conjunctivae normal.  Cardiovascular:     Rate and Rhythm: Normal rate and regular rhythm.     Pulses: Normal  pulses.     Heart sounds: Normal heart sounds, S1 normal and S2 normal. No murmur heard.    No friction rub. No gallop.  Pulmonary:     Effort: Pulmonary effort is normal. No respiratory distress.     Breath sounds: Normal breath sounds.  Abdominal:     General: Bowel sounds are normal.     Palpations: Abdomen is soft.     Tenderness: There is no abdominal tenderness. There is no guarding or rebound.     Hernia: No hernia is present.  Musculoskeletal:        General: No swelling.     Cervical back: Full passive range of motion without pain, normal range of motion and neck supple. No spinous process tenderness or muscular tenderness. Normal range of motion.     Right lower leg: No edema.     Left lower leg: No edema.  Skin:    General: Skin is warm and dry.     Capillary Refill: Capillary refill takes less than 2 seconds.     Findings: No ecchymosis, erythema, rash or wound.  Neurological:     General: No focal deficit present.     Mental Status: She is alert and oriented to person, place, and time.     GCS: GCS eye subscore is 4. GCS verbal subscore is 5. GCS motor subscore is 6.     Cranial Nerves: Cranial nerves 2-12 are intact.     Sensory: Sensation is intact.     Motor: Motor function is intact.     Coordination: Coordination is intact.  Psychiatric:        Attention and Perception: Attention normal.        Mood and Affect: Mood normal.        Speech: Speech normal.        Behavior: Behavior normal.     (all labs ordered are listed, but only abnormal results are displayed) Labs Reviewed  BASIC METABOLIC PANEL WITH GFR - Abnormal; Notable for the following components:      Result Value   Sodium 132 (*)    Chloride 96 (*)    Glucose, Bld 357 (*)    Creatinine, Ser 1.26 (*)    GFR, Estimated 51 (*)    All other components within normal limits  CBC - Abnormal; Notable for the following components:   MCV 79.5 (*)    All other components within normal limits   URINALYSIS, ROUTINE W REFLEX MICROSCOPIC - Abnormal; Notable for the following components:   APPearance HAZY (*)    Glucose, UA >=500 (*)    Nitrite POSITIVE (*)    Leukocytes,Ua MODERATE (*)    All other components within normal limits  HEMOGLOBIN A1C - Abnormal; Notable for the following components:   Hgb A1c  MFr Bld 12.5 (*)    All other components within normal limits  CBG MONITORING, ED - Abnormal; Notable for the following components:   Glucose-Capillary 305 (*)    All other components within normal limits  TROPONIN I (HIGH SENSITIVITY) - Abnormal; Notable for the following components:   Troponin I (High Sensitivity) 39 (*)    All other components within normal limits  TROPONIN I (HIGH SENSITIVITY) - Abnormal; Notable for the following components:   Troponin I (High Sensitivity) 38 (*)    All other components within normal limits  URINE CULTURE  D-DIMER, QUANTITATIVE    EKG: EKG Interpretation Date/Time:  Monday July 19 2023 23:30:58 EDT Ventricular Rate:  78 PR Interval:  198 QRS Duration:  130 QT Interval:  426 QTC Calculation: 485 R Axis:   137  Text Interpretation: Normal sinus rhythm Right bundle branch block Abnormal ECG When compared with ECG of 19-Nov-2022 14:44, infero-lateral T wave inversion has resolved Confirmed by Haze Lonni PARAS [phone]) on 07/20/2023 1:21:28 AM  Radiology: DG Chest 2 View Result Date: 07/19/2023 CLINICAL DATA:  Chest pain EXAM: CHEST - 2 VIEW COMPARISON:  11/19/2022 FINDINGS: Stable cardiomediastinal silhouette. Patchy linear opacities in the left mid and lower lung are favored to represent atelectasis. Otherwise no focal consolidation. No pleural effusion or pneumothorax. No displaced rib fractures. IMPRESSION: Left basilar atelectasis.  No acute abnormality. Electronically Signed   By: Norman Gatlin M.D.   On: 07/19/2023 23:47     Procedures   Medications Ordered in the ED  insulin  aspart (novoLOG ) injection 0-15 Units (11  Units Subcutaneous Given 07/20/23 0149)  amLODipine  (NORVASC ) tablet 5 mg (has no administration in time range)  hydrALAZINE  (APRESOLINE ) injection 5 mg (has no administration in time range)  cefTRIAXone  (ROCEPHIN ) 1 g in sodium chloride  0.9 % 100 mL IVPB (has no administration in time range)                                    Medical Decision Making Amount and/or Complexity of Data Reviewed Labs: ordered. Radiology: ordered.  Risk Prescription drug management.   Differential Diagnosis considered includes, but not limited to: STEMI; NSTEMI; myocarditis; pericarditis; pulmonary embolism; aortic dissection; pneumothorax; pneumonia; gastritis; musculoskeletal pain  Patient presents to the emergency department for evaluation of chest discomfort.  Patient reports that she developed pain in the center of her chest earlier today.  At that point she noted her blood pressure was elevated.  She does have a history of hypertension, reports that she has been taking her medications as prescribed.  At time of evaluation symptoms are improved.  She still has a small pleuritic component to her chest pain but no continuous pain.  EKG without ischemic changes.  First troponin mildly elevated at 39.  A repeat troponin is stable at 38.  She does not have chronically elevated troponins, I suspect this is secondary to her uncontrolled hypertension.  Give additional Norvasc  and IV hydralazine  for blood pressure control.  Patient also hyperglycemic.  No evidence of DKA.  Treated with sliding scale short acting insulin .  Sugars are improving.  I believe patient will require hospitalization for better control of her blood sugar and blood pressures.     Final diagnoses:  Hypertensive urgency  Hyperglycemia    ED Discharge Orders     None          Haze Lonni PARAS, MD 07/20/23 [phone]

## 2023-07-21 ENCOUNTER — Telehealth: Payer: Self-pay

## 2023-07-21 NOTE — Transitions of Care (Post Inpatient/ED Visit) (Signed)
 07/21/2023  Name: [name] MRN: [id number] DOB: 01/28/1970  Today's TOC FU Call Status: Today's TOC FU Call Status:: Successful TOC FU Call Completed TOC FU Call Complete Date: 07/21/23 Patient's Name and Date of Birth confirmed.  Transition Care Management Follow-up Telephone Call Date of Discharge: 07/20/23 Discharge Facility: [facility]) Type of Discharge: Inpatient Admission Primary Inpatient Discharge Diagnosis:: hypertensiom How have you been since you were released from the hospital?: Better Any questions or concerns?: No  Items Reviewed: Did you receive and understand the discharge instructions provided?: Yes Medications obtained,verified, and reconciled?: Yes (Medications Reviewed) Any new allergies since your discharge?: No Dietary orders reviewed?: Yes Do you have support at home?: Yes People in Home [RPT]: other relative(s)  Medications Reviewed Today: Medications Reviewed Today     Reviewed by Emmitt Pan, LPN (Licensed Practical Nurse) on 07/21/23 at 1038  Med List Status: <None>   Medication Order Taking? Sig Documenting Provider Last Dose Status Informant  Accu-Chek Softclix Lancets lancets [id number] Yes Use as instructed Lemon Raisin, MD  Active Self, Pharmacy Records  acetaminophen  (TYLENOL ) 500 MG tablet [id number] Yes Take 500 mg by mouth every 6 (six) hours as needed for moderate pain or headache. [provider]  Active Self, Pharmacy Records           Med Note ANGUS MAXWELL   Wed Nov 18, 2022  2:25 PM)    amLODipine  (NORVASC ) 10 MG tablet [id number] Yes Take 1 tablet (10 mg total) by mouth daily. Elnora Ip, MD  Active   apixaban  (ELIQUIS ) 5 MG TABS tablet [id number] Yes Take 1 tablet (5 mg total) by mouth 2 (two) times daily. Gomez-Caraballo, Maria, MD  Active   Blood Glucose Monitoring Suppl (ACCU-CHEK GUIDE) w/Device KIT [id number] Yes Use to check blood sugar once daily. Lemon Raisin, MD  Active Self, Pharmacy  Records  Empagliflozin -metFORMIN  HCl (SYNJARDY ) 12.05-998 MG TABS [id number] Yes Take 1 tablet by mouth 2 (two) times daily. Francella Rogue, MD  Active Self, Pharmacy Records  glucose blood (ACCU-CHEK GUIDE) test strip [id number] Yes Use as directed to check blood sugar once daily. Lemon Raisin, MD  Active Self, Pharmacy Records  Insulin  Degludec-Liraglutide  (XULTOPHY ) 100-3.6 UNIT-MG/ML SOPN [id number] Yes Inject 31 Units into the skin at bedtime. Francella Rogue, MD  Active Self, Pharmacy Records  Insulin  Pen Needle (PEN NEEDLES) 32G X 4 MM MISC [id number] Yes use as directed Atway, Rayann N, DO  Active Self, Pharmacy Records  latanoprost  (XALATAN ) 0.005 % ophthalmic solution [id number] Yes Place 1 drop into both eyes at bedtime.   Active Self, Pharmacy Records  losartan  (COZAAR ) 100 MG tablet [id number] Yes Take 1/2 tablet (50 mg total) by mouth daily. Francella Rogue, MD  Active Self, Pharmacy Records  metoprolol  succinate (TOPROL -XL) 25 MG 24 hr tablet [id number] Yes Take 3 tablets (75 mg total) by mouth daily. Francella Rogue, MD  Active Self, Pharmacy Records  nitroGLYCERIN  (NITROSTAT ) 0.4 MG SL tablet [id number] Yes Place 1 tablet (0.4 mg total) under the tongue every 5 (five) minutes as needed for chest pain. Cheryle Page, MD  Active Self, Pharmacy Records           Med Note (COFFELL, ANGELA M   Tue Jul 20, 2023  9:24 AM) Pt states she has this medication available but did not know what it was for.  spironolactone  (ALDACTONE ) 25 MG tablet [id number] Yes Take 1 tablet (25 mg total) by mouth daily. Francella Rogue, MD  Active Self, Pharmacy Records  Home Care and Equipment/Supplies: Were Home Health Services Ordered?: NA Any new equipment or medical supplies ordered?: NA  Functional Questionnaire: Do you need assistance with bathing/showering or dressing?: No Do you need assistance with meal preparation?: No Do you need assistance with eating?: No Do you have difficulty maintaining  continence: No Do you need assistance with getting out of bed/getting out of a chair/moving?: No Do you have difficulty managing or taking your medications?: No  Follow up appointments reviewed: PCP Follow-up appointment confirmed?: Yes Date of PCP follow-up appointment?: 07/27/23 Follow-up Provider: Nooruddin Specialist [facility] Follow-up appointment confirmed?: NA Do you need transportation to your follow-up appointment?: No Do you understand care options if your condition(s) worsen?: Yes-patient verbalized understanding    SIGNATURE Julian Lemmings, LPN [facility] Nurse Health Advisor Direct Dial [phone]

## 2023-07-22 LAB — URINE CULTURE: Culture: >=100000 — AB

## 2023-07-27 ENCOUNTER — Encounter: Admitting: Student

## 2023-07-29 ENCOUNTER — Other Ambulatory Visit: Payer: Self-pay | Admitting: Family Medicine

## 2023-07-29 ENCOUNTER — Other Ambulatory Visit (HOSPITAL_COMMUNITY): Payer: Self-pay

## 2023-07-29 ENCOUNTER — Ambulatory Visit

## 2023-07-29 VITALS — BP 158/80 | HR 71 | Temp 98.5°F | Ht 66.0 in | Wt 167.0 lb

## 2023-07-29 DIAGNOSIS — I10 Essential (primary) hypertension: Secondary | ICD-10-CM

## 2023-07-29 DIAGNOSIS — Z87440 Personal history of urinary (tract) infections: Secondary | ICD-10-CM

## 2023-07-29 DIAGNOSIS — E1169 Type 2 diabetes mellitus with other specified complication: Secondary | ICD-10-CM

## 2023-07-29 DIAGNOSIS — I4891 Unspecified atrial fibrillation: Secondary | ICD-10-CM

## 2023-07-29 DIAGNOSIS — N3090 Cystitis, unspecified without hematuria: Secondary | ICD-10-CM

## 2023-07-29 DIAGNOSIS — M79651 Pain in right thigh: Secondary | ICD-10-CM

## 2023-07-29 DIAGNOSIS — I480 Paroxysmal atrial fibrillation: Secondary | ICD-10-CM

## 2023-07-29 DIAGNOSIS — I421 Obstructive hypertrophic cardiomyopathy: Secondary | ICD-10-CM | POA: Diagnosis not present

## 2023-07-29 DIAGNOSIS — M79652 Pain in left thigh: Secondary | ICD-10-CM

## 2023-07-29 DIAGNOSIS — Z794 Long term (current) use of insulin: Secondary | ICD-10-CM

## 2023-07-29 MED ORDER — GLUCOSE BLOOD VI STRP
1.0000 | ORAL_STRIP | 2 refills | Status: AC
  Filled 2023-07-29: qty 100, 90d supply, fill #0

## 2023-07-29 MED ORDER — ACCU-CHEK SOFTCLIX LANCETS MISC
12 refills | Status: AC
  Filled 2023-07-29: qty 100, 100d supply, fill #0

## 2023-07-29 MED ORDER — ACCU-CHEK GUIDE W/DEVICE KIT
PACK | 0 refills | Status: AC
  Filled 2023-07-29: qty 1, 90d supply, fill #0

## 2023-07-29 NOTE — Progress Notes (Unsigned)
 Patient name: [name] Date of birth: 08-15-1970 Date of visit: 07/30/23  Type of visit: Hospital Follow Up  Subjective   Chief concern:  Chief Complaint  Patient presents with   Hospitalization Follow-up   thigh  pain radiating to the hip    [name] is a 53 y.o. 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  Follow up Hospitalization  Patient was admitted to [facility] on 07/19/23 and discharged on 07/20/23. She was treated for chest pain, hypertensive urgency and hyperglycemia. Treatment for this included Amlodipine  and Hydralazine  for BP which resolved her chest pain after normalizing her BP, and she was given insulin  and diabetes medication management. Her presentation to the hospital with the above issues were found to be in the setting of medication non-adherence.  Telephone follow up was done on 07/21/23 She reports excellent adherence with treatment. She reports this condition is improved, she is not having any chest pain or difficulty breathing. She denies any headaches.    For her diabetes, she was last seen in the [facility] on 06/10/23, and at that time A1C had improved to 12.5 from 13.8 prior. She was increased to insulin  degludec-liraglutide  (Xultophy ) 31 units daily, and instructed to continue empagliflozin -metformin  (Synjardy ) 12.05-998 mg BID. She had a CGM device but she took it off in 05/2023, and she reports that her Accu-check device is broken and she does not have the sensor of the CGM  device, so she has not checked her sugars daily. Today she reports complete adherence to this regimen since discharge from the hospital. She denies any perceived episodes of low blood sugar.   In the hospital, there was evidence of hypokalemia to 3.1. She denies muscle cramps, muscle spasms, nausea, vomiting, constipation, or difficulty breathing.   UA in the hospital showed nitrites and  leukocytes on admission, though patient was asymptomatic throughout hospital course. Today she denies urinary urgency, increased urinary frequency, pain or burning with urinating.   She was previously followed by Cardiology for PAF and HOCM. She last saw Cardiology in 01/2022, at that time she was continued on Xarelto  for A-fib, and for HOCM, she was asymptomatic and was continued on Verapamil  and Metoprolol . She was supposed to obtain an updated TTE in 01/2023 and follow up with Dr. Catherene, but she did not complete either of these. At this point, it does not appear she has set anything up with Cardiology yet. Verapamil  was discontinued following 11/2022 hospitalization for SBO. During this most recent hospitalization, she was switched from Xarelto  to Eliquis  5 mg twice daily. She reports complete adherence to the new regimen of Eliquis  twice daily and Metoprolol .  ROS: Denies headaches, dizziness, fever, chills, runny nose, sore throat, vision changes, hearing changes, chest pain, shortness of breath, difficulty breathing, nausea, vomiting, abdominal pain. Denies increased urinary frequency, pain with urination, constipation or diarrhea. No recent falls.   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) 03/25/2023   Small bowel obstruction (HCC) 11/18/2022   HOCM (hypertrophic obstructive cardiomyopathy) (HCC) 04/03/2017   Atrial fibrillation (HCC) 05/23/2012   Essential hypertension 05/23/2012   Type 2 diabetes mellitus with other specified complication (HCC) 05/23/2012     Past Surgical History:  Procedure Laterality Date   CESAREAN SECTION  2005; 2006  CHOLECYSTECTOMY N/A 03/25/2017   Procedure: LAPAROSCOPIC CHOLECYSTECTOMY;  Surgeon: Kinsinger, Herlene Righter, MD;  Location: [facility]  Service: General;  Laterality: N/A;   HERNIA REPAIR      VENTRAL HERNIA REPAIR  02/05/2005   with mesh     Current Outpatient Medications  Medication Instructions   Accu-Chek Softclix Lancets lancets Test blood sugars once daily every morning before breakfast.   acetaminophen  (TYLENOL ) 500 mg, Every 6 hours PRN   amLODipine  (NORVASC ) 10 mg, Oral, Daily   Blood Glucose Monitoring Suppl (ACCU-CHEK GUIDE) w/Device KIT Check fasting blood glucose daily   Eliquis  5 mg, Oral, 2 times daily   Empagliflozin -metFORMIN  HCl (SYNJARDY ) 12.05-998 MG TABS 1 tablet, Oral, 2 times daily   glucose blood test strip Test blood sugars once daily every morning before breakfast.   Insulin  Degludec-Liraglutide  (XULTOPHY ) 100-3.6 UNIT-MG/ML SOPN 31 Units, Subcutaneous, Daily at bedtime   Insulin  Pen Needle (PEN NEEDLES) 32G X 4 MM MISC use as directed   latanoprost  (XALATAN ) 0.005 % ophthalmic solution 1 drop, Both Eyes, Daily at bedtime   losartan  (COZAAR ) 100 MG tablet Take 1/2 tablet (50 mg total) by mouth daily.   metoprolol  succinate (TOPROL -XL) 75 mg, Oral, Daily   nitroGLYCERIN  (NITROSTAT ) 0.4 mg, Sublingual, Every 5 min PRN   spironolactone  (ALDACTONE ) 25 mg, Oral, Daily    Social History   Tobacco Use   Smoking status: Never   Smokeless tobacco: Never  Vaping Use   Vaping status: Never Used  Substance Use Topics   Alcohol use: Never   Drug use: Never      Objective  Today's Vitals   07/29/23 0824 07/29/23 0913  BP: (!) 166/90 (!) 158/80  Pulse: 74 71  Temp: 98.5 F (36.9 C)   TempSrc: Oral   SpO2: 96%   Weight: 167 lb (75.8 kg)   Height: 5' 6 (1.676 m)   PainSc: 8    Body mass index is 26.95 kg/m.   Physical Exam:   Constitutional: well-appearing female sitting in exam chair, in no acute distress. Ambulates without use of assistance device  HEENT: normocephalic atraumatic, mucous membranes moist Eyes: conjunctiva non-erythematous Neck: supple Cardiovascular: regular rate and rhythm, bilateral radial pulses 2+, bilateral dorsal  pedal pulses 2+, brisk capillary refill bilateral feet and hands  Pulmonary/Chest: normal work of breathing on room air, lungs clear to auscultation bilaterally Abdominal: soft, non-tender, non-distended, no CVA tenderness MSK: normal bulk and tone.Negative SLR. TTP R>L thigh; Decreased AROM supine R hip flexion; strength 4/5 right hip flexion Neurological: alert & oriented x 3 Skin: warm and dry Psych: mood calm, behavior normal, thought content normal, judgement normal    Last CBC Lab Results  Component Value Date   WBC 8.2 07/19/2023   HGB 13.2 07/19/2023   HCT 40.3 07/19/2023   MCV 79.5 (L) 07/19/2023   MCH 26.0 07/19/2023   RDW 14.3 07/19/2023   PLT 222 07/19/2023   Last metabolic panel Lab Results  Component Value Date   GLUCOSE 191 (H) 07/29/2023   NA 141 07/29/2023   K 4.3 07/29/2023   CL 101 07/29/2023   CO2 22 07/29/2023   BUN 20 07/29/2023   CREATININE 0.91 07/29/2023   EGFR 76 07/29/2023   CALCIUM 9.9 07/29/2023   PHOS 4.3 06/14/2018   PROT 6.8 11/20/2022   ALBUMIN 3.0 (L) 11/20/2022   BILITOT 0.4 11/20/2022   ALKPHOS 91 11/20/2022   AST 23 11/20/2022   ALT 22 11/20/2022   ANIONGAP 11 07/20/2023  Last hemoglobin A1c Lab Results  Component Value Date   HGBA1C 12.5 (H) 07/20/2023      The ASCVD Risk score (Arnett DK, et al., 2019) failed to calculate for the following reasons:   Risk score cannot be calculated because patient has a medical history suggesting prior/existing ASCVD      Assessment & Plan  Problem List Items Addressed This Visit       Cardiovascular and Mediastinum   HOCM (hypertrophic obstructive cardiomyopathy) (HCC) (Chronic)   Patient has a history of HOCM, and was last seen by cardiology in 01/2022.  At that time she was continued on verapamil  and metoprolol .  She was supposed to obtain an updated TTE and follow-up with Dr. Catherene, but she states that she did not hear from their office and so she assumed that she did not  need to meet with them.  By chart review it appears that verapamil  was discontinued following 11/2022 hospitalization for SBO, and it was never resumed.  She reports complete adherence to metoprolol  75 mg daily, and she is currently taking Eliquis  5 mg twice daily for A-fib.  We will place a referral to cardiology with Dr. Catherene.  - Continue metoprolol  75 mg daily - Referral to cardiology with Dr. Catherene.       Relevant Orders   Ambulatory referral to Cardiology   Atrial fibrillation [facility])   Patient has a history of A-fib for which she was taking Xarelto , but during most recent hospital admission from 7/14 to 07/20/2023, she was switched to Eliquis  5 mg twice daily.  Today she reports complete adherence to this new regimen.  She was supposed to see cardiology in 01/2023 and also have a repeat echo at that time, but she says she thought that the cardiology office would reach out to her and when they did not she assumed that she did not need to see them.  She denies any chest pain or shortness of breath today.  We will place a referral to Dr. Catherene.  -Continue Eliquis  5 mg twice daily -Referral to cardiology with Dr. Catherene      Relevant Orders   Ambulatory referral to Cardiology   Essential hypertension - Primary   Patient with a history of poorly controlled hypertension in the setting of primary hyperaldosteronism who presents to clinic for hospital follow-up following admission from 07/19/2023 to 07/20/2023 for chest pain and hypertensive urgency with blood pressure elevated to 200s/100.  Blood pressure was stabilized with amlodipine  and hydralazine , after which her chest pain fully resolved.  Since discharge she denies any episodes of chest pain, difficulty breathing, or headaches.  She states that she has been taking amlodipine  10 mg daily and losartan  100 mg tablet 1.5-tablets daily.  According to her medication list she is supposed to be taking losartan  50 mg daily  (losartan  100 mg tablet 1/2 tablet daily).  However patient did not bring any of her medications today, so it is unclear if this is the medication she is taking 1.5 tablets of.  Today in the office blood pressure is 166/90 and 158/80 on repeat.  She does not have a blood pressure cuff at home.  Given the uncertainty of how she is taking her medications, I have asked her to follow-up in 1 week for blood pressure check and bring all of her medications with her so that we can confirm exactly how much of which medication she is taking.  I anticipate that at that time we will continue amlodipine  10  mg daily and increase her losartan  to 100 mg daily.  If her blood pressure continues to remain elevated, we can consider increasing spironolactone  which she currently takes 25 mg daily.  BMP obtained today showed normal kidney function and resolution of her hypokalemia during the hospital.  I have asked her if there is any difficulty obtaining her prescriptions, paying for her prescriptions, and she denies this.  However, I do think that at her appointment next week to consider titration of her blood pressure medications, it would be helpful to engage [facility] for pharmacy assistance. -Continue amlodipine  10 mg daily -Continue losartan  50 mg daily  -Continue spironolactone  25 mg daily -Continue metoprolol  75 mg daily - Follow-up in 1 week for blood pressure recheck, at that time consider amlodipine  10 mg daily, increasing losartan  to 100 mg daily, and if blood pressure remains elevated increasing her spironolactone  at that time      Relevant Orders   Basic metabolic panel with GFR     Endocrine   Type 2 diabetes mellitus with other specified complication (HCC) (Chronic)   Patient presents to clinic today following hospitalization from 7/14 to 07/20/2023.  She was last seen in the [facility] on 06/10/2023, at that time A1c was 12.5, improved from 13.8 at visit prior.  At that visit she was increased to Xultophy  31 units  daily and continued on Synjardy  12.05-998 mg twice daily.  She reports that she does not have a CGM sensor, and that her glucometer for fingersticks is not working, so she has not been checking her sugars. She is requesting new glucometer, lancets, and test strips to check her AM sugars.  I would also like to resume her CGM in the future, and asked her to bring her kit and all of her medications to her next visit.  We discussed a diabetic management referral to [facility], patient states that she has met with her in the past.  We can consider reengaging [facility] to assist with her in the future.  She is coming back to the [facility] in 1 week for a blood pressure recheck, but I have also asked her to schedule a 4-6 week follow-up in the [facility] for diabetes management and recheck A1c.  I have asked her to keep a detailed log of her fasting AM sugars to bring with her to her appointment in 4-6 weeks.  -Continue Synjardy  12.05-998 mg twice daily -Continue Xultophy  31 units daily -Follow-up in 4-6 weeks for A1c recheck -Blood glucose log provided for her to fill out and bring to her next appointment -Sent refill of glucometer device, lancets, blood glucose test strips      Relevant Medications   Accu-Chek Softclix Lancets lancets     Genitourinary   Cystitis   UA in the hospital showed nitrites and leukocytes on admission, the patient remained asymptomatic throughout hospital course.  Today she denies urinary urgency increased urinary frequency, pain or burning with urinating, pelvic pain, CVA tenderness.  Urine culture grew E. Coli. 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  At this time I do not feel it is appropriate to treat this. -Continue to monitor her symptoms        Other   Bilateral thigh pain   She has had anterior thigh pain R>L that she discussed at her last visit in the [facility].  We discussed topical treatments such as Biofreeze cream and lidocaine  patches.  She was given  hip flexor/quad home exercise  and stretches, and instructed to not complete any that are painful.  If this persists, we can refer her to physical therapy or sports medicine.        Return in about 1 week (around 08/05/2023) for BP recheck. AND return in 4-6 weeks for DMII f/u and A1C recheck.   Patient discussed with Dr. Jeanelle, who also saw and evaluated the patient.  Doyal Miyamoto, MD [facility]  PGY-1 07/30/2023, 9:00 AM

## 2023-07-29 NOTE — Patient Instructions (Addendum)
 Thank you, Ms.Kirston T Maenza for allowing us  to provide your care today. Today we discussed the following:  - Please continue to take all of your medications as prescribed. Make sure that you bring your blood pressure medications with you to your next appointment.  - I have ordered a new blood glucose monitoring device. Please check your fasting blood sugars in the morning, every morning, and bring the log to your next visits. - For your quad, you may use over the counter Biofreeze or IcyHot cream, lidocaine  patches, and I will give you some light stretching and strengthening exercises. Do not do any stretches or exercises that cause pain.   I have ordered the following labs for you:  Lab Orders         Basic metabolic panel with GFR       Referrals ordered today:   Referral Orders         Ambulatory referral to Cardiology       I have ordered the following medication/changed the following medications:    Start the following medications: Meds ordered this encounter  Medications   Blood Glucose Monitoring Suppl (ACCU-CHEK GUIDE) w/Device KIT    Sig: Check fasting blood glucose daily    Dispense:  1 kit    Refill:  0   Accu-Chek Softclix Lancets lancets    Sig: Use as instructed    Dispense:  100 each    Refill:  12     Follow up: 1-2 weeks for BP recheck; 4-6 weeks for DMII    Remember: Please make sure that you bring all of your medications and pill holder to your next appointments. I would like for you to also bring your daily fasting blood glucose log.   Should you have any questions or concerns please call the Internal Medicine Clinic at [phone].     Doyal Miyamoto, MD [facility]

## 2023-07-30 ENCOUNTER — Ambulatory Visit: Payer: Self-pay

## 2023-07-30 LAB — BASIC METABOLIC PANEL WITH GFR
BUN/Creatinine Ratio: 22 (ref 9–23)
BUN: 20 mg/dL (ref 6–24)
CO2: 22 mmol/L (ref 20–29)
Calcium: 9.9 mg/dL (ref 8.7–10.2)
Chloride: 101 mmol/L (ref 96–106)
Creatinine, Ser: 0.91 mg/dL (ref 0.57–1.00)
Glucose: 191 mg/dL — ABNORMAL HIGH (ref 70–99)
Potassium: 4.3 mmol/L (ref 3.5–5.2)
Sodium: 141 mmol/L (ref 134–144)
eGFR: 76 mL/min/1.73 (ref >59)

## 2023-07-30 NOTE — Assessment & Plan Note (Addendum)
 Patient with a history of poorly controlled hypertension in the setting of primary hyperaldosteronism who presents to clinic for hospital follow-up following admission from 07/19/2023 to 07/20/2023 for chest pain and hypertensive urgency with blood pressure elevated to 200s/100.  Blood pressure was stabilized with amlodipine  and hydralazine , after which her chest pain fully resolved.  Since discharge she denies any episodes of chest pain, difficulty breathing, or headaches.  She states that she has been taking amlodipine  10 mg daily and losartan  100 mg tablet 1.5-tablets daily.  According to her medication list she is supposed to be taking losartan  50 mg daily (losartan  100 mg tablet 1/2 tablet daily).  However patient did not bring any of her medications today, so it is unclear if this is the medication she is taking 1.5 tablets of.  Today in the office blood pressure is 166/90 and 158/80 on repeat.  She does not have a blood pressure cuff at home.  Given the uncertainty of how she is taking her medications, I have asked her to follow-up in 1 week for blood pressure check and bring all of her medications with her so that we can confirm exactly how much of which medication she is taking.  I anticipate that at that time we will continue amlodipine  10 mg daily and increase her losartan  to 100 mg daily.  If her blood pressure continues to remain elevated, we can consider increasing spironolactone  which she currently takes 25 mg daily.  BMP obtained today showed normal kidney function and resolution of her hypokalemia during the hospital.  I have asked her if there is any difficulty obtaining her prescriptions, paying for her prescriptions, and she denies this.  However, I do think that at her appointment next week to consider titration of her blood pressure medications, it would be helpful to engage [facility] Baseman for pharmacy assistance. -Continue amlodipine  10 mg daily -Continue losartan  50 mg daily  -Continue  spironolactone  25 mg daily -Continue metoprolol  75 mg daily - Follow-up in 1 week for blood pressure recheck, at that time consider amlodipine  10 mg daily, increasing losartan  to 100 mg daily, and if blood pressure remains elevated increasing her spironolactone  at that time

## 2023-07-30 NOTE — Assessment & Plan Note (Signed)
 Patient has a history of A-fib for which she was taking Xarelto , but during most recent hospital admission from 7/14 to 07/20/2023, she was switched to Eliquis  5 mg twice daily.  Today she reports complete adherence to this new regimen.  She was supposed to see cardiology in 01/2023 and also have a repeat echo at that time, but she says she thought that the cardiology office would reach out to her and when they did not she assumed that she did not need to see them.  She denies any chest pain or shortness of breath today.  We will place a referral to Dr. Catherene.  -Continue Eliquis  5 mg twice daily -Referral to cardiology with Dr. Catherene

## 2023-07-30 NOTE — Assessment & Plan Note (Signed)
 She has had anterior thigh pain R>L that she discussed at her last visit in the [facility].  We discussed topical treatments such as Biofreeze cream and lidocaine  patches.  She was given hip flexor/quad home exercise and stretches, and instructed to not complete any that are painful.  If this persists, we can refer her to physical therapy or sports medicine.

## 2023-07-30 NOTE — Assessment & Plan Note (Addendum)
 Patient presents to clinic today following hospitalization from 7/14 to 07/20/2023.  She was last seen in the [facility] on 06/10/2023, at that time A1c was 12.5, improved from 13.8 at visit prior.  At that visit she was increased to Xultophy  31 units daily and continued on Synjardy  12.05-998 mg twice daily.  She reports that she does not have a CGM sensor, and that her glucometer for fingersticks is not working, so she has not been checking her sugars. She is requesting new glucometer, lancets, and test strips to check her AM sugars.  I would also like to resume her CGM in the future, and asked her to bring her kit and all of her medications to her next visit.  We discussed a diabetic management referral to [facility], patient states that she has met with her in the past.  We can consider reengaging [facility] to assist with her in the future.  She is coming back to the [facility] in 1 week for a blood pressure recheck, but I have also asked her to schedule a 4-6 week follow-up in the [facility] for diabetes management and recheck A1c.  I have asked her to keep a detailed log of her fasting AM sugars to bring with her to her appointment in 4-6 weeks.  -Continue Synjardy  12.05-998 mg twice daily -Continue Xultophy  31 units daily -Follow-up in 4-6 weeks for A1c recheck -Blood glucose log provided for her to fill out and bring to her next appointment -Sent refill of glucometer device, lancets, blood glucose test strips

## 2023-07-30 NOTE — Progress Notes (Signed)
 Internal Medicine Clinic Attending  I was physically present during the key portions of the resident provided service and participated in the medical decision making of patient's management care. I reviewed pertinent patient test results.  The assessment, diagnosis, and plan were formulated together and I agree with the documentation in the resident's note.  Carney Living, MD

## 2023-07-30 NOTE — Assessment & Plan Note (Signed)
 UA in the hospital showed nitrites and leukocytes on admission, the patient remained asymptomatic throughout hospital course.  Today she denies urinary urgency increased urinary frequency, pain or burning with urinating, pelvic pain, CVA tenderness.  Urine culture grew E. Coli. 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  At this time I do not feel it is appropriate to treat this. -Continue to monitor her symptoms

## 2023-07-30 NOTE — Assessment & Plan Note (Signed)
 Patient has a history of HOCM, and was last seen by cardiology in 01/2022.  At that time she was continued on verapamil  and metoprolol .  She was supposed to obtain an updated TTE and follow-up with Dr. Catherene, but she states that she did not hear from their office and so she assumed that she did not need to meet with them.  By chart review it appears that verapamil  was discontinued following 11/2022 hospitalization for SBO, and it was never resumed.  She reports complete adherence to metoprolol  75 mg daily, and she is currently taking Eliquis  5 mg twice daily for A-fib.  We will place a referral to cardiology with Dr. Catherene.  - Continue metoprolol  75 mg daily - Referral to cardiology with Dr. Catherene.

## 2023-07-30 NOTE — Assessment & Plan Note (Signed)
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  Blood pressure was stabilized with amlodipine  and hydralazine , after which her chest pain fully resolved.  Since discharge she denies any episodes of chest pain, difficulty breathing, or headaches.  She states that she has been taking amlodipine  10 mg daily and losartan  100 mg tablet 1.5-tablets daily.  According to her medication list she is supposed to be taking losartan  50 mg daily (losartan  100 mg tablet 1/2 tablet daily).  However patient did not bring any of her medications today, so it is unclear if this is the medication she is taking 1.5 tablets of.  Today in the office blood pressure is 166/90 and 158/80 on repeat.  She does not have a blood pressure cuff at home.  Given the uncertainty of how she is taking her medications, I have asked her to follow-up in 1 week for blood pressure check and bring all of her medications with her so that we can confirm exactly how much of which medication she is taking.  I anticipate that at that time we will continue amlodipine  10 mg daily and increase her losartan  to 100 mg daily.  If her blood pressure continues to remain elevated, we can consider increasing spironolactone  which she currently takes 25 mg daily.  BMP obtained today showed normal kidney function and resolution of her hypokalemia during the hospital.  I have asked her if there is any difficulty obtaining her prescriptions, paying for her prescriptions, and she denies this.  However, I do think that at her appointment next week to consider titration of her blood pressure medications, it would be helpful to engage [facility] Baseman for  pharmacy assistance. -Continue amlodipine  10 mg daily -Continue losartan  50 mg daily  -Continue spironolactone  25 mg daily -Continue metoprolol  75 mg daily - Follow-up in 1 week for blood pressure recheck, at that time consider amlodipine  10 mg daily, increasing losartan  to 100 mg daily, and if blood pressure remains elevated increasing her spironolactone  at that time

## 2023-08-12 NOTE — Progress Notes (Deleted)
  Patient name: [name] Date of birth: Dec 06, 1970 Date of visit: 08/12/23  Subjective   Chief concern: ***  ROS  Current Outpatient Medications  Medication Instructions   Accu-Chek Softclix Lancets lancets Test blood sugars once daily every morning before breakfast.   acetaminophen  (TYLENOL ) 500 mg, Every 6 hours PRN   amLODipine  (NORVASC ) 10 mg, Oral, Daily   Blood Glucose Monitoring Suppl (ACCU-CHEK GUIDE) w/Device KIT Check fasting blood glucose daily   Eliquis  5 mg, Oral, 2 times daily   Empagliflozin -metFORMIN  HCl (SYNJARDY ) 12.05-998 MG TABS 1 tablet, Oral, 2 times daily   glucose blood test strip Test blood sugars once daily every morning before breakfast.   Insulin  Degludec-Liraglutide  (XULTOPHY ) 100-3.6 UNIT-MG/ML SOPN 31 Units, Subcutaneous, Daily at bedtime   Insulin  Pen Needle (PEN NEEDLES) 32G X 4 MM MISC use as directed   latanoprost  (XALATAN ) 0.005 % ophthalmic solution 1 drop, Both Eyes, Daily at bedtime   losartan  (COZAAR ) 100 MG tablet Take 1/2 tablet (50 mg total) by mouth daily.   metoprolol  succinate (TOPROL -XL) 75 mg, Oral, Daily   nitroGLYCERIN  (NITROSTAT ) 0.4 mg, Sublingual, Every 5 min PRN   spironolactone  (ALDACTONE ) 25 mg, Oral, Daily     Objective  There were no vitals filed for this visit.There is no height or weight on file to calculate BMI.   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  Ozell Kung MD 08/12/2023, 11:52 AM

## 2023-08-13 ENCOUNTER — Encounter: Admitting: Student

## 2023-08-26 ENCOUNTER — Encounter: Admitting: Student

## 2023-09-06 ENCOUNTER — Emergency Department (HOSPITAL_COMMUNITY)

## 2023-09-06 ENCOUNTER — Other Ambulatory Visit: Payer: Self-pay

## 2023-09-06 ENCOUNTER — Encounter (HOSPITAL_COMMUNITY): Payer: Self-pay

## 2023-09-06 ENCOUNTER — Emergency Department (HOSPITAL_COMMUNITY)
Admission: EM | Admit: 2023-09-06 | Discharge: 2023-09-06 | Disposition: A | Discharge status: Home / Self Care | Attending: Emergency Medicine | Admitting: Emergency Medicine

## 2023-09-06 DIAGNOSIS — E1165 Type 2 diabetes mellitus with hyperglycemia: Secondary | ICD-10-CM | POA: Insufficient documentation

## 2023-09-06 DIAGNOSIS — R079 Chest pain, unspecified: Secondary | ICD-10-CM | POA: Diagnosis present

## 2023-09-06 DIAGNOSIS — R7989 Other specified abnormal findings of blood chemistry: Secondary | ICD-10-CM | POA: Insufficient documentation

## 2023-09-06 DIAGNOSIS — Z794 Long term (current) use of insulin: Secondary | ICD-10-CM | POA: Insufficient documentation

## 2023-09-06 DIAGNOSIS — Z79899 Other long term (current) drug therapy: Secondary | ICD-10-CM | POA: Insufficient documentation

## 2023-09-06 DIAGNOSIS — I10 Essential (primary) hypertension: Secondary | ICD-10-CM | POA: Insufficient documentation

## 2023-09-06 DIAGNOSIS — Z7901 Long term (current) use of anticoagulants: Secondary | ICD-10-CM | POA: Insufficient documentation

## 2023-09-06 LAB — BASIC METABOLIC PANEL WITH GFR
Anion gap: 15 (ref 5–15)
BUN: 17 mg/dL (ref 6–20)
CO2: 24 mmol/L (ref 22–32)
Calcium: 8.9 mg/dL (ref 8.9–10.3)
Chloride: 97 mmol/L — ABNORMAL LOW (ref 98–111)
Creatinine, Ser: 1.02 mg/dL — ABNORMAL HIGH (ref 0.44–1.00)
GFR, Estimated: >60 mL/min (ref >60)
Glucose, Bld: 344 mg/dL — ABNORMAL HIGH (ref 70–99)
Potassium: 3.9 mmol/L (ref 3.5–5.1)
Sodium: 136 mmol/L (ref 135–145)

## 2023-09-06 LAB — CBC
HCT: 39.7 % (ref 36.0–46.0)
Hemoglobin: 12.5 g/dL (ref 12.0–15.0)
MCH: 25.9 pg — ABNORMAL LOW (ref 26.0–34.0)
MCHC: 31.5 g/dL (ref 30.0–36.0)
MCV: 82.2 fL (ref 80.0–100.0)
Platelets: 266 K/uL (ref 150–400)
RBC: 4.83 MIL/uL (ref 3.87–5.11)
RDW: 14.2 % (ref 11.5–15.5)
WBC: 9.5 K/uL (ref 4.0–10.5)
nRBC: 0 % (ref 0.0–0.2)

## 2023-09-06 LAB — TROPONIN I (HIGH SENSITIVITY)
Troponin I (High Sensitivity): 16 ng/L (ref <18)
Troponin I (High Sensitivity): 17 ng/L (ref <18)

## 2023-09-06 MED ORDER — IOHEXOL 350 MG/ML SOLN
75.0000 mL | Freq: Once | INTRAVENOUS | Status: AC | PRN
  Administered 2023-09-06: 75 mL via INTRAVENOUS

## 2023-09-06 MED ORDER — KETOROLAC TROMETHAMINE 30 MG/ML IJ SOLN
30.0000 mg | Freq: Once | INTRAMUSCULAR | Status: AC
  Administered 2023-09-06: 30 mg via INTRAVENOUS
  Filled 2023-09-06: qty 1

## 2023-09-06 NOTE — ED Triage Notes (Signed)
 Pt reports she was lying down at approx 2200 when she had sudden onset of sharp right chest pain. Pain is intermittent and she states that pain radiates to upper back when she lies flat.

## 2023-09-06 NOTE — Discharge Instructions (Addendum)
 Your evaluation did not show any serious cause for your pain.  You may take acetaminophen  and/or ibuprofen  as needed for pain.  Your blood pressure is higher than I would like to see it.  I recommend that she check your blood pressure at home.  You should check it once or twice a day, and keep a record of it and take that record with you whenever you see your primary care provider.  Additional blood pressure readings can help guide your medical provider to decide whether you need your blood pressure medication adjusted.  Return to the emergency department if you have any new or concerning symptoms.

## 2023-09-06 NOTE — ED Notes (Signed)
 Patient transported to CT

## 2023-09-06 NOTE — ED Provider Notes (Signed)
 [place] EMERGENCY DEPARTMENT AT [facility] Provider Note   CSN: [id number] Arrival date & time: 09/06/23  9964     Patient presents with: Chest Pain   [name] is a 53 y.o. female.   The history is provided by the patient.  Chest Pain  She has history of hypertension, diabetes, paroxysmal atrial fibrillation anticoagulated on apixaban  and comes in because of right sided chest pain which started this evening.  Pain is sharp and goes into her back.  It is worse with a deep breath.  Pain seems to be waxing and waning.  She denies dyspnea, nausea, diaphoresis.  She has had similar pain in the past, does not recall diagnosis.  She states she has been compliant with her medications.  She does not check her blood pressure at home.  She denies headache, tinnitus, epistaxis.     Prior to Admission medications   Medication Sig Start Date End Date Taking? Authorizing Provider  Accu-Chek Softclix Lancets lancets Test blood sugars once daily every morning before breakfast. 07/29/23   Leontine Lapine, MD  acetaminophen  (TYLENOL ) 500 MG tablet Take 500 mg by mouth every 6 (six) hours as needed for moderate pain or headache.    [provider]  amLODipine  (NORVASC ) 10 MG tablet Take 1 tablet (10 mg total) by mouth daily. 07/21/23   Elnora Ip, MD  apixaban  (ELIQUIS ) 5 MG TABS tablet Take 1 tablet (5 mg total) by mouth 2 (two) times daily. 07/20/23   Elnora Ip, MD  Blood Glucose Monitoring Suppl (ACCU-CHEK GUIDE) w/Device KIT Check fasting blood glucose daily 07/29/23   Leontine Lapine, MD  Empagliflozin -metFORMIN  HCl (SYNJARDY ) 12.05-998 MG TABS Take 1 tablet by mouth 2 (two) times daily. 06/10/23   Francella Rogue, MD  glucose blood test strip Test blood sugars once daily every morning before breakfast. 07/29/23   Nguyen, Diana, MD  Insulin  Degludec-Liraglutide  (XULTOPHY ) 100-3.6 UNIT-MG/ML SOPN Inject 31 Units into the skin at bedtime. 06/10/23   Francella Rogue, MD   Insulin  Pen Needle (PEN NEEDLES) 32G X 4 MM MISC use as directed 02/23/23   Atway, Rayann N, DO  latanoprost  (XALATAN ) 0.005 % ophthalmic solution Place 1 drop into both eyes at bedtime. 04/26/23     losartan  (COZAAR ) 100 MG tablet Take 1/2 tablet (50 mg total) by mouth daily. 06/10/23   Francella Rogue, MD  metoprolol  succinate (TOPROL -XL) 25 MG 24 hr tablet Take 3 tablets (75 mg total) by mouth daily. 06/10/23   Francella Rogue, MD  nitroGLYCERIN  (NITROSTAT ) 0.4 MG SL tablet Place 1 tablet (0.4 mg total) under the tongue every 5 (five) minutes as needed for chest pain. 11/20/22   Cheryle Page, MD  spironolactone  (ALDACTONE ) 25 MG tablet Take 1 tablet (25 mg total) by mouth daily. 06/10/23   Francella Rogue, MD    Allergies: Patient has no known allergies.    Review of Systems  Cardiovascular:  Positive for chest pain.  All other systems reviewed and are negative.   Updated Vital Signs BP (!) 225/98 (BP Location: Left Arm)   Pulse 72   Temp 98.2 F (36.8 C)   Resp 16   LMP 10/01/2006   SpO2 99%   Physical Exam Vitals and nursing note reviewed.   53 year old female, resting comfortably and in no acute distress. Vital signs are significant for markedly elevated blood pressure. Oxygen saturation is 99%, which is normal. Head is normocephalic and atraumatic. PERRLA, EOMI. Oropharynx is clear. Neck is nontender and supple  without adenopathy. Back is nontender and there is no CVA tenderness. Lungs are clear without rales, wheezes, or rhonchi. Chest is nontender. Heart has regular rate and rhythm without murmur. Abdomen is soft, flat, nontender. Extremities have no cyanosis or edema, full range of motion is present. Skin is warm and dry without rash. Neurologic: Mental status is normal, cranial nerves are intact, moves all extremities equally.  (all labs ordered are listed, but only abnormal results are displayed) Labs Reviewed  BASIC METABOLIC PANEL WITH GFR - Abnormal; Notable for the  following components:      Result Value   Chloride 97 (*)    Glucose, Bld 344 (*)    Creatinine, Ser 1.02 (*)    All other components within normal limits  CBC - Abnormal; Notable for the following components:   MCH 25.9 (*)    All other components within normal limits  TROPONIN I (HIGH SENSITIVITY)  TROPONIN I (HIGH SENSITIVITY)    EKG: EKG Interpretation Date/Time:  Monday September 06 2023 00:52:40 EDT Ventricular Rate:  74 PR Interval:  198 QRS Duration:  134 QT Interval:  442 QTC Calculation: 490 R Axis:   116  Text Interpretation: Normal sinus rhythm Right bundle branch block Left posterior fascicular block Bifascicular block Abnormal ECG When compared with ECG of 19-Jul-2023 23:30, No significant change was found Confirmed by Raford Lenis ([id number]) on 09/06/2023 1:28:36 AM  Radiology: CT Angio Chest/Abd/Pel for Dissection W and/or Wo Contrast Result Date: 09/06/2023 CLINICAL DATA:  Right chest pain, back pain, thoracoabdominal aortic aneurysm EXAM: CT ANGIOGRAPHY CHEST, ABDOMEN AND PELVIS TECHNIQUE: Non-contrast CT of the chest was initially obtained. Multidetector CT imaging through the chest, abdomen and pelvis was performed using the standard protocol during bolus administration of intravenous contrast. Multiplanar reconstructed images and MIPs were obtained and reviewed to evaluate the vascular anatomy. RADIATION DOSE REDUCTION: This exam was performed according to the departmental dose-optimization program which includes automated exposure control, adjustment of the mA and/or kV according to patient size and/or use of iterative reconstruction technique. CONTRAST:  75mL OMNIPAQUE  IOHEXOL  350 MG/ML SOLN COMPARISON:  None Available. FINDINGS: CTA CHEST FINDINGS Cardiovascular: No significant coronary artery calcification. Cardiac size is within normal limits. Adequate opacification of the pulmonary arterial tree. No intraluminal filling defect identified to suggest acute pulmonary  embolism. Central pulmonary arteries are of normal caliber. No pericardial effusion. Mild atherosclerotic calcification within the thoracic aorta. No intramural hematoma, dissection, or aneurysm the thoracic aorta. Mediastinum/Nodes: 11 mm nodule within the right thyroid  lobe noted. Not clinically significant; no follow-up imaging recommended (ref: J Am Coll Radiol. 2015 Feb;12(2): 143-50).No pathologic thoracic adenopathy. Esophagus unremarkable. Lungs/Pleura: Lungs are clear. No pleural effusion or pneumothorax. Musculoskeletal: No chest wall abnormality. No acute or significant osseous findings. Review of the MIP images confirms the above findings. CTA ABDOMEN AND PELVIS FINDINGS VASCULAR Aorta: Normal caliber aorta without aneurysm, dissection, vasculitis or significant stenosis. Celiac: Patent without evidence of aneurysm, dissection, vasculitis or significant stenosis. SMA: Patent without evidence of aneurysm, dissection, vasculitis or significant stenosis. Renals: Both renal arteries are patent without evidence of aneurysm, dissection, vasculitis, fibromuscular dysplasia or significant stenosis. IMA: Patent without evidence of aneurysm, dissection, vasculitis or significant stenosis. Inflow: Patent without evidence of aneurysm, dissection, vasculitis or significant stenosis. Veins: No obvious venous abnormality within the limitations of this arterial phase study. Review of the MIP images confirms the above findings. NON-VASCULAR Hepatobiliary: No focal liver abnormality is seen. Status post cholecystectomy. No biliary dilatation. Pancreas: Unremarkable Spleen: Unremarkable Adrenals/Urinary  Tract: Adrenal glands are unremarkable. Kidneys are normal, without renal calculi, focal lesion, or hydronephrosis. Bladder is unremarkable. Stomach/Bowel: Moderate colonic stool burden. Stomach, small bowel, and large bowel are otherwise unremarkable. No evidence of obstruction or focal inflammation. Appendix is normal. No  free intraperitoneal gas or fluid. Lymphatic: No pathologic adenopathy within the abdomen pelvis. Reproductive: Uterus and bilateral adnexa are unremarkable. Other: Infraumbilical ventral hernia repair with mesh has been performed with calcification of the mesh implant. No recurrent abdominal wall hernia Musculoskeletal: No acute or significant osseous findings. Review of the MIP images confirms the above findings. IMPRESSION: 1. No evidence of thoracoabdominal aortic aneurysm or dissection. 2. No acute intrathoracic or intra-abdominal pathology identified. 3. Moderate colonic stool burden. 4. Aortic atherosclerosis. Aortic Atherosclerosis (ICD10-I70.0). Electronically Signed   By: Dorethia Molt M.D.   On: 09/06/2023 03:03   DG Chest 2 View Result Date: 09/06/2023 CLINICAL DATA:  Chest pain EXAM: CHEST - 2 VIEW COMPARISON:  None Available. FINDINGS: Lungs are symmetrically expanded. Minimal left basilar parenchymal scarring. Lungs are otherwise clear. No pneumothorax or pleural effusion. Cardiac size within normal limits. Pulmonary vascularity is normal. Osseous structures are age-appropriate. No acute bone abnormality. IMPRESSION: No active cardiopulmonary disease. Electronically Signed   By: Dorethia Molt M.D.   On: 09/06/2023 01:48   Cardiac monitor shows normal sinus rhythm, per my interpretation.  Procedures   Medications Ordered in the ED  iohexol  (OMNIPAQUE ) 350 MG/ML injection 75 mL (75 mLs Intravenous Contrast Given 09/06/23 0157)  ketorolac  (TORADOL ) 30 MG/ML injection 30 mg (30 mg Intravenous Given 09/06/23 0328)                                    Medical Decision Making Amount and/or Complexity of Data Reviewed Labs: ordered. Radiology: ordered.  Risk Prescription drug management.   Right-sided chest pain radiating to the back.  This is a presentation with a wide range of treatment options and carries with it a high risk of morbidity and complications.  Additionally, patient has  markedly elevated blood pressure and is anticoagulated.  Differential diagnosis includes, but is not limited to, ACS, aortic aneurysm, pulmonary embolism, musculoskeletal pain.  I have reviewed her past records, and note hospitalization on 07/19/2023 for hypertensive urgency, but blood pressure on follow-up exam in the office on 07/29/2023 was only moderately elevated.  She did have an ED evaluation on 01/16/2016 for similar sounding pain.  I have reviewed her electrocardiogram, and my interpretation is right bundle branch block, left posterior fascicular block, unchanged from prior.  I have checked blood pressure on both arms and do note significant difference-blood pressure 226/98 in the right arm, 208/95 in the left arm.  This is concerning for thoracic aortic aneurysm.  I have ordered CT angiogram.  I have reviewed her laboratory tests, and my interpretation is borderline elevated creatinine which is not significantly changed from baseline, elevated glucose consistent with known history of diabetes, normal CBC, normal troponin x 2.  CT angiogram shows no evidence of aneurysm or dissection, no acute intrathoracic or intra-abdominal pathology.  I have independently viewed the images, and agree with the radiologist's interpretation.  With simple observation, patient's blood pressure has come down.  Last blood pressure is 172/84.  While elevated, this does not require emergent intervention.  I am discharging her with instructions to follow-up with her primary care provider to adjust her blood pressure medication.  I am recommending  she monitor her blood pressure daily at home.  I had also ordered a dose of ketorolac  for pain.  She is to take acetaminophen  and/or ibuprofen  as needed for pain at home.  Return for worsening symptoms.     Final diagnoses:  Nonspecific chest pain  Poorly-controlled hypertension    ED Discharge Orders     None          Raford Lenis, MD 09/06/23 [phone]

## 2023-10-31 ENCOUNTER — Emergency Department (HOSPITAL_COMMUNITY): Admission: EM | Admit: 2023-10-31 | Discharge: 2023-10-31 | Disposition: A | Discharge status: Home / Self Care

## 2023-10-31 ENCOUNTER — Emergency Department (HOSPITAL_COMMUNITY)

## 2023-10-31 ENCOUNTER — Encounter (HOSPITAL_COMMUNITY): Payer: Self-pay | Admitting: Emergency Medicine

## 2023-10-31 DIAGNOSIS — Z794 Long term (current) use of insulin: Secondary | ICD-10-CM | POA: Insufficient documentation

## 2023-10-31 DIAGNOSIS — Z7901 Long term (current) use of anticoagulants: Secondary | ICD-10-CM | POA: Insufficient documentation

## 2023-10-31 DIAGNOSIS — M25551 Pain in right hip: Secondary | ICD-10-CM | POA: Insufficient documentation

## 2023-10-31 NOTE — ED Triage Notes (Signed)
 Pt states that the pain has been ongoing since August pain starts in her hip and radiates her right thigh

## 2023-10-31 NOTE — ED Triage Notes (Signed)
 PT arrives via POV. PT c/o right thigh pain for 1 month. Denies injury. PT ambulatory.

## 2023-10-31 NOTE — ED Provider Notes (Signed)
 [facility] AT [facility] Provider Note   CSN: [id number] Arrival date & time: 10/31/23  1421     Patient presents with: Leg Pain   [name] is a 53 y.o. female.    Leg Pain Associated symptoms: no back pain and no fever    Patient complains of right hip pain.  Ongoing since August.  No fall.  No injury.  No leg swelling.  No history DVT or PE.  Hurts with ambulation.  Fever no chills.  Patient's been taken Tylenol  p.o. without relief.    Previous medical history reviewed : Patient last seen in ED in September 2025.  Seen because of chest pain.  Negative workup at that time.   Prior to Admission medications   Medication Sig Start Date End Date Taking? Authorizing Provider  Accu-Chek Softclix Lancets lancets Test blood sugars once daily every morning before breakfast. 07/29/23   Leontine Lapine, MD  acetaminophen  (TYLENOL ) 500 MG tablet Take 500 mg by mouth every 6 (six) hours as needed for moderate pain or headache.    [provider]  amLODipine  (NORVASC ) 10 MG tablet Take 1 tablet (10 mg total) by mouth daily. 07/21/23   Elnora Ip, MD  apixaban  (ELIQUIS ) 5 MG TABS tablet Take 1 tablet (5 mg total) by mouth 2 (two) times daily. 07/20/23   Elnora Ip, MD  Blood Glucose Monitoring Suppl (ACCU-CHEK GUIDE) w/Device KIT Check fasting blood glucose daily 07/29/23   Leontine Lapine, MD  Empagliflozin -metFORMIN  HCl (SYNJARDY ) 12.05-998 MG TABS Take 1 tablet by mouth 2 (two) times daily. 06/10/23   Francella Rogue, MD  glucose blood test strip Test blood sugars once daily every morning before breakfast. 07/29/23   Nguyen, Diana, MD  Insulin  Degludec-Liraglutide  (XULTOPHY ) 100-3.6 UNIT-MG/ML SOPN Inject 31 Units into the skin at bedtime. 06/10/23   Francella Rogue, MD  Insulin  Pen Needle (PEN NEEDLES) 32G X 4 MM MISC use as directed 02/23/23   Atway, Rayann N, DO  latanoprost  (XALATAN ) 0.005 % ophthalmic solution Place 1 drop into both  eyes at bedtime. 04/26/23     losartan  (COZAAR ) 100 MG tablet Take 1/2 tablet (50 mg total) by mouth daily. 06/10/23   Francella Rogue, MD  metoprolol  succinate (TOPROL -XL) 25 MG 24 hr tablet Take 3 tablets (75 mg total) by mouth daily. 06/10/23   Francella Rogue, MD  nitroGLYCERIN  (NITROSTAT ) 0.4 MG SL tablet Place 1 tablet (0.4 mg total) under the tongue every 5 (five) minutes as needed for chest pain. 11/20/22   Cheryle Page, MD  spironolactone  (ALDACTONE ) 25 MG tablet Take 1 tablet (25 mg total) by mouth daily. 06/10/23   Francella Rogue, MD    Allergies: Patient has no known allergies.    Review of Systems  Constitutional:  Negative for chills and fever.  HENT:  Negative for ear pain and sore throat.   Eyes:  Negative for pain and visual disturbance.  Respiratory:  Negative for cough and shortness of breath.   Cardiovascular:  Negative for chest pain and palpitations.  Gastrointestinal:  Negative for abdominal pain and vomiting.  Genitourinary:  Negative for dysuria and hematuria.  Musculoskeletal:  Negative for arthralgias and back pain.  Skin:  Negative for color change and rash.  Neurological:  Negative for seizures and syncope.  All other systems reviewed and are negative.   Updated Vital Signs BP (!) 183/92 (BP Location: Right Arm)   Pulse 80   Temp 97.8 F (36.6 C) (Oral)   Resp 16  LMP 10/01/2006   SpO2 97%   Physical Exam Vitals and nursing note reviewed.  Constitutional:      General: She is not in acute distress.    Appearance: She is well-developed.  HENT:     Head: Normocephalic and atraumatic.  Eyes:     Conjunctiva/sclera: Conjunctivae normal.  Cardiovascular:     Rate and Rhythm: Normal rate and regular rhythm.     Heart sounds: No murmur heard. Pulmonary:     Effort: Pulmonary effort is normal. No respiratory distress.     Breath sounds: Normal breath sounds.  Abdominal:     Palpations: Abdomen is soft.     Tenderness: There is no abdominal tenderness.   Musculoskeletal:        General: No swelling.     Cervical back: Neck supple.       Legs:  Skin:    General: Skin is warm and dry.     Capillary Refill: Capillary refill takes less than 2 seconds.  Neurological:     Mental Status: She is alert.  Psychiatric:        Mood and Affect: Mood normal.     (all labs ordered are listed, but only abnormal results are displayed) Labs Reviewed - No data to display  EKG: None  Radiology: DG Hip Unilat W or Wo Pelvis 2-3 Views Right Result Date: 10/31/2023 CLINICAL DATA:  Right hip pain since August. EXAM: DG HIP (WITH OR WITHOUT PELVIS) 2-3V RIGHT COMPARISON:  None Available. FINDINGS: Hip joint space is maintained bilaterally. No degenerative changes. Sacroiliac joints are patent. Ventral hernia repair. IMPRESSION: No acute findings. Electronically Signed   By: Newell Eke M.D.   On: 10/31/2023 15:37     Procedures   Medications Ordered in the ED - No data to display                                  Medical Decision Making    HPI:  Patient complains of right hip pain.  Ongoing since August.  No fall.  No injury.  No leg swelling.  No history DVT or PE.  Hurts with ambulation.  Fever no chills.  Patient's been taken Tylenol  p.o. without relief.    Previous medical history reviewed : Patient last seen in ED in September 2025.  Seen because of chest pain.  Negative workup at that time.  MDM:   Upon exam, patient hemodynamically stable.  Pain to palpation of right hip.  Pain with both active and passive range of motion.  No swelling to the lower extremity.  No concerns for DVT.  2+ dorsal pedal and posterior tibial pulses.  No concerns for ischemic pathology.  Obtain x-ray of the right hip.  Nontraumatic in nature.  Likely arthritic pain.  Reevaluation:   Upon reexamination, patient hemodynamically stable.  Remains A&O x 3 with GCS 15.  X-ray negative.  Recommend Tylenol  ibuprofen .  Referred to orthopedic surgery.   Unclear whether or not this could be more related to the hip versus SI joint.  I think it still reasonable to follow-up with orthopedic surgery first to see what they think.  Recommend physical therapy as well.   No bowel bladder incontinence.  No midline pain.  No indication for further imaging    I have independently interpreted the XR    Social Determinant of Health: None    Disposition and Follow Up: Ortho  Final diagnoses:  Right hip pain    ED Discharge Orders     None          Simon Lavonia SAILOR, MD 10/31/23 1549

## 2023-10-31 NOTE — ED Provider Triage Note (Signed)
 Emergency Medicine Provider Triage Evaluation Note  [name] , a 53 y.o. female  was evaluated in triage.  Pt complains of right hip pain.  Patient notes that symptoms been ongoing since August, she was prescribed a topical medication by her PCP which she has been applying but is not effective.  She denies any fall/injury/inciting event, the pain is to right lateral thigh/hip.  She occasionally notes tingling in bilateral feet, none presently.  She has been taking Tylenol  daily for relief of her pain but this has not improved her symptoms.  Review of Systems  Positive: As above Negative: As above  Physical Exam  BP (!) 183/92 (BP Location: Right Arm)   Pulse 80   Temp 97.8 F (36.6 C) (Oral)   Resp 16   LMP 10/01/2006   SpO2 97%  Gen:   Awake, no distress   Resp:  Normal effort  MSK:   Moves extremities without difficulty  Other:  Full ROM of bilateral LE's, R hip/bony pelvis is without TTP  Medical Decision Making  Medically screening exam initiated at 3:00 PM.  Appropriate orders placed.  [name] was informed that the remainder of the evaluation will be completed by another provider, this initial triage assessment does not replace that evaluation, and the importance of remaining in the ED until their evaluation is complete.     [name], [place] 10/31/23 1501

## 2023-10-31 NOTE — Discharge Instructions (Addendum)
 For pain, you can take 1000 mg of Tylenol  or 1 g of Tylenol  every 6-8 hours.  Do not exceed more than 4000 mg or 4 g in a 24-hour period.  You can also take ibuprofen  600 to 800 mg every 6-8 hours as well.  Do not take this high-dose ibuprofen  for greater than a week.  You can also purchase lidocaine  patch to place over the right hip.   Please follow-up with orthopedic surgery.  They may want to perform further testing.
